# Patient Record
Sex: Female | Born: 1940
Health system: Southern US, Community
[De-identification: ages and names within clinical notes are randomized; demographics above are authoritative.]

## PROBLEM LIST (undated history)

## (undated) DIAGNOSIS — I10 Essential (primary) hypertension: Secondary | ICD-10-CM

## (undated) DIAGNOSIS — N183 Chronic kidney disease, stage 3 unspecified: Secondary | ICD-10-CM

## (undated) DIAGNOSIS — E785 Hyperlipidemia, unspecified: Secondary | ICD-10-CM

## (undated) DIAGNOSIS — E119 Type 2 diabetes mellitus without complications: Secondary | ICD-10-CM

## (undated) DIAGNOSIS — B019 Varicella without complication: Secondary | ICD-10-CM

## (undated) DIAGNOSIS — R55 Syncope and collapse: Secondary | ICD-10-CM

## (undated) DIAGNOSIS — I779 Disorder of arteries and arterioles, unspecified: Secondary | ICD-10-CM

## (undated) DIAGNOSIS — I619 Nontraumatic intracerebral hemorrhage, unspecified: Secondary | ICD-10-CM

## (undated) DIAGNOSIS — I739 Peripheral vascular disease, unspecified: Secondary | ICD-10-CM

## (undated) HISTORY — DX: Chronic kidney disease, stage 3 unspecified: N18.30

## (undated) HISTORY — DX: Peripheral vascular disease, unspecified: I73.9

## (undated) HISTORY — DX: Nontraumatic intracerebral hemorrhage, unspecified: I61.9

## (undated) HISTORY — DX: Disorder of arteries and arterioles, unspecified: I77.9

## (undated) HISTORY — DX: Essential (primary) hypertension: I10

## (undated) HISTORY — PX: COLONOSCOPY: SHX174

## (undated) HISTORY — DX: Type 2 diabetes mellitus without complications: E11.9

## (undated) HISTORY — DX: Varicella without complication: B01.9

## (undated) HISTORY — DX: Hyperlipidemia, unspecified: E78.5

## (undated) HISTORY — DX: Chronic kidney disease, stage 3 (moderate): N18.3

## (undated) HISTORY — DX: Syncope and collapse: R55

## (undated) HISTORY — PX: NECK SURGERY: SHX720

---

## 2005-08-29 ENCOUNTER — Ambulatory Visit: Payer: Self-pay | Admitting: Internal Medicine

## 2005-09-03 ENCOUNTER — Ambulatory Visit: Payer: Self-pay | Admitting: Internal Medicine

## 2005-11-07 ENCOUNTER — Ambulatory Visit: Payer: Self-pay | Admitting: Internal Medicine

## 2005-12-02 ENCOUNTER — Ambulatory Visit: Payer: Self-pay | Admitting: Internal Medicine

## 2006-01-02 ENCOUNTER — Ambulatory Visit: Payer: Self-pay | Admitting: Internal Medicine

## 2006-02-01 ENCOUNTER — Ambulatory Visit: Payer: Self-pay | Admitting: Internal Medicine

## 2006-03-21 ENCOUNTER — Ambulatory Visit: Payer: Self-pay | Admitting: Unknown Physician Specialty

## 2006-04-24 ENCOUNTER — Ambulatory Visit: Payer: Self-pay | Admitting: Internal Medicine

## 2006-05-04 ENCOUNTER — Ambulatory Visit: Payer: Self-pay | Admitting: Internal Medicine

## 2006-08-14 ENCOUNTER — Ambulatory Visit: Payer: Self-pay | Admitting: Internal Medicine

## 2006-09-03 ENCOUNTER — Ambulatory Visit: Payer: Self-pay | Admitting: Internal Medicine

## 2006-10-09 ENCOUNTER — Ambulatory Visit: Payer: Self-pay | Admitting: Internal Medicine

## 2006-11-02 ENCOUNTER — Ambulatory Visit: Payer: Self-pay | Admitting: Internal Medicine

## 2006-12-03 ENCOUNTER — Ambulatory Visit: Payer: Self-pay | Admitting: Internal Medicine

## 2006-12-16 ENCOUNTER — Ambulatory Visit: Payer: Self-pay | Admitting: Internal Medicine

## 2006-12-24 ENCOUNTER — Ambulatory Visit: Payer: Self-pay | Admitting: Internal Medicine

## 2007-01-02 ENCOUNTER — Ambulatory Visit: Payer: Self-pay | Admitting: Internal Medicine

## 2007-04-15 ENCOUNTER — Ambulatory Visit: Payer: Self-pay | Admitting: Internal Medicine

## 2007-05-05 ENCOUNTER — Ambulatory Visit: Payer: Self-pay | Admitting: Internal Medicine

## 2007-08-04 ENCOUNTER — Ambulatory Visit: Payer: Self-pay | Admitting: Internal Medicine

## 2007-08-13 ENCOUNTER — Ambulatory Visit: Payer: Self-pay | Admitting: Internal Medicine

## 2007-09-04 ENCOUNTER — Ambulatory Visit: Payer: Self-pay | Admitting: Internal Medicine

## 2007-10-14 ENCOUNTER — Ambulatory Visit: Payer: Self-pay | Admitting: Internal Medicine

## 2008-02-05 ENCOUNTER — Ambulatory Visit: Payer: Self-pay | Admitting: Internal Medicine

## 2008-03-03 ENCOUNTER — Ambulatory Visit: Payer: Self-pay | Admitting: Internal Medicine

## 2008-08-03 ENCOUNTER — Ambulatory Visit: Payer: Self-pay | Admitting: Internal Medicine

## 2008-08-09 ENCOUNTER — Ambulatory Visit: Payer: Self-pay | Admitting: Internal Medicine

## 2008-09-03 ENCOUNTER — Ambulatory Visit: Payer: Self-pay | Admitting: Internal Medicine

## 2008-10-28 ENCOUNTER — Ambulatory Visit: Payer: Self-pay | Admitting: Internal Medicine

## 2009-06-02 ENCOUNTER — Ambulatory Visit: Payer: Self-pay | Admitting: Unknown Physician Specialty

## 2009-07-04 ENCOUNTER — Ambulatory Visit: Payer: Self-pay | Admitting: Internal Medicine

## 2009-08-02 ENCOUNTER — Ambulatory Visit: Payer: Self-pay | Admitting: Internal Medicine

## 2009-08-03 ENCOUNTER — Ambulatory Visit: Payer: Self-pay | Admitting: Internal Medicine

## 2009-10-31 ENCOUNTER — Ambulatory Visit: Payer: Self-pay | Admitting: Internal Medicine

## 2009-11-02 ENCOUNTER — Ambulatory Visit: Payer: Self-pay | Admitting: Internal Medicine

## 2010-05-09 ENCOUNTER — Ambulatory Visit: Payer: Self-pay | Admitting: Internal Medicine

## 2010-11-29 ENCOUNTER — Ambulatory Visit: Payer: Self-pay | Admitting: Internal Medicine

## 2011-12-03 ENCOUNTER — Ambulatory Visit: Payer: Self-pay | Admitting: Internal Medicine

## 2011-12-20 ENCOUNTER — Observation Stay: Payer: Self-pay | Admitting: Otolaryngology

## 2011-12-24 LAB — PATHOLOGY REPORT

## 2013-01-21 ENCOUNTER — Ambulatory Visit: Payer: Self-pay | Admitting: Internal Medicine

## 2013-07-07 ENCOUNTER — Other Ambulatory Visit: Payer: Self-pay | Admitting: *Deleted

## 2013-07-08 ENCOUNTER — Encounter: Payer: Self-pay | Admitting: Vascular Surgery

## 2013-08-17 ENCOUNTER — Encounter: Payer: Self-pay | Admitting: Vascular Surgery

## 2013-08-18 ENCOUNTER — Ambulatory Visit (INDEPENDENT_AMBULATORY_CARE_PROVIDER_SITE_OTHER): Payer: Medicare Other | Admitting: Vascular Surgery

## 2013-08-18 ENCOUNTER — Encounter: Payer: Self-pay | Admitting: Vascular Surgery

## 2013-08-18 ENCOUNTER — Ambulatory Visit (HOSPITAL_COMMUNITY)
Admission: RE | Admit: 2013-08-18 | Discharge: 2013-08-18 | Disposition: A | Discharge status: Home / Self Care | Payer: Medicare Other | Source: Ambulatory Visit | Attending: Vascular Surgery | Admitting: Vascular Surgery

## 2013-08-18 DIAGNOSIS — I6529 Occlusion and stenosis of unspecified carotid artery: Secondary | ICD-10-CM

## 2013-08-18 DIAGNOSIS — I658 Occlusion and stenosis of other precerebral arteries: Secondary | ICD-10-CM | POA: Insufficient documentation

## 2013-08-18 NOTE — Progress Notes (Signed)
Subjective:     Patient ID: Laurie Harris, female   DOB: [birth year], 72 y.o.   MRN: [id number]  HPI this 72 year old female was referred for carotid occlusive disease by Dr.Shaukat Welton Flakes of [facility]. Patient has no history of stroke, lateralizing weakness, aphasia, amaurosis fugax, diplopia, blurred vision, or syncope. She was found to have a carotid bruit and has had carotid ultrasound studies for the past few years revealing some mild to moderate disease. She was referred for further evaluation.  Past Medical History  Diagnosis Date  . Carotid artery occlusion   . Dyspnea   . Diabetes mellitus without complication   . Hypertension   . Hyperlipidemia     History  Substance Use Topics  . Smoking status: Never Smoker   . Smokeless tobacco: Never Used  . Alcohol Use: No    Family History  Problem Relation Age of Onset  . Diabetes Mother   . Cancer Father     colon    No Known Allergies  Current outpatient prescriptions:alendronate (FOSAMAX) 70 MG tablet, Take 70 mg by mouth once a week. Take with a full glass of water on an empty stomach., Disp: , Rfl: ;  amLODipine (NORVASC) 5 MG tablet, Take 5 mg by mouth daily., Disp: , Rfl: ;  aspirin 81 MG tablet, Take 81 mg by mouth daily., Disp: , Rfl: ;  atorvastatin (LIPITOR) 80 MG tablet, Take 80 mg by mouth daily., Disp: , Rfl:  benazepril (LOTENSIN) 20 MG tablet, Take 20 mg by mouth daily., Disp: , Rfl: ;  Calcium Carbonate-Vitamin D (CALTRATE 600+D) 600-400 MG-UNIT per tablet, Take 1 tablet by mouth daily., Disp: , Rfl: ;  Ergocalciferol (VITAMIN D2) 2000 UNITS TABS, Take by mouth., Disp: , Rfl: ;  fenofibrate 160 MG tablet, Take 160 mg by mouth daily., Disp: , Rfl: ;  glimepiride (AMARYL) 2 MG tablet, Take 2 mg by mouth 2 (two) times daily., Disp: , Rfl:  hydrALAZINE (APRESOLINE) 50 MG tablet, Take 50 mg by mouth 2 (two) times daily., Disp: , Rfl: ;  metFORMIN (GLUCOPHAGE) 500 MG tablet, Take by mouth daily., Disp: , Rfl: ;   metoprolol (LOPRESSOR) 50 MG tablet, Take 50 mg by mouth 2 (two) times daily., Disp: , Rfl: ;  Multiple Vitamins-Minerals (SENIOR MULTIVITAMIN PLUS PO), Take by mouth., Disp: , Rfl: ;  Omega-3 Fatty Acids (FISH OIL) 1200 MG CAPS, Take by mouth., Disp: , Rfl:  pantoprazole (PROTONIX) 40 MG tablet, Take 40 mg by mouth daily., Disp: , Rfl: ;  warfarin (COUMADIN) 4 MG tablet, Take 4 mg by mouth at bedtime., Disp: , Rfl: ;  sitaGLIPtin (JANUVIA) 100 MG tablet, Take 100 mg by mouth daily., Disp: , Rfl:   BP 128/72  Pulse 60  Resp 16  Ht 5\' 6"  (1.676 m)  Wt 133 lb (60.328 kg)  BMI 21.48 kg/m2  Body mass index is 21.48 kg/(m^2).          Review of Systems denies chest pain, dyspnea on exertion, PND, orthopnea, hemoptysis, lateralizing weakness, all systems negative complete review of systems    Objective:   Physical Exam BP 128/72  Pulse 60  Resp 16  Ht 5\' 6"  (1.676 m)  Wt 133 lb (60.328 kg)  BMI 21.48 kg/m2  Gen.-alert and oriented x3 in no apparent distress HEENT normal for age Lungs no rhonchi or wheezing Cardiovascular regular rhythm no murmurs carotid pulses 3+ palpable no bruits audible Abdomen soft nontender no palpable masses Musculoskeletal free of  major deformities  Skin clear -no rashes Neurologic normal Lower extremities 3+ femoral and dorsalis pedis pulses palpable bilaterally with no edema  Today I ordered a carotid duplex exam which I reviewed and interpreted. It appears that she has a mild ICA stenosis bilaterally in the 40% range.       Assessment:     Mild bilateral ICA stenosis approximately 40% with no symptoms    Plan:     Would recommend repeating this ultrasound study in 2 years to see if there has been significant progression of disease The patient develops symptoms then repeat study should be done at that time [facility] send that that will be performed in Dr. Santo Held office as per patient request

## 2014-02-05 LAB — HM COLONOSCOPY: HM COLON: NORMAL

## 2014-03-03 ENCOUNTER — Ambulatory Visit: Payer: Self-pay | Admitting: Internal Medicine

## 2014-03-08 LAB — HM DIABETES EYE EXAM

## 2014-06-04 DIAGNOSIS — D126 Benign neoplasm of colon, unspecified: Secondary | ICD-10-CM | POA: Insufficient documentation

## 2014-07-19 ENCOUNTER — Ambulatory Visit: Payer: Self-pay | Admitting: Unknown Physician Specialty

## 2014-09-03 DIAGNOSIS — I619 Nontraumatic intracerebral hemorrhage, unspecified: Secondary | ICD-10-CM

## 2014-09-03 HISTORY — DX: Nontraumatic intracerebral hemorrhage, unspecified: I61.9

## 2014-09-06 DIAGNOSIS — I739 Peripheral vascular disease, unspecified: Secondary | ICD-10-CM | POA: Diagnosis not present

## 2014-09-06 DIAGNOSIS — E119 Type 2 diabetes mellitus without complications: Secondary | ICD-10-CM | POA: Diagnosis not present

## 2014-09-10 DIAGNOSIS — E780 Pure hypercholesterolemia: Secondary | ICD-10-CM | POA: Diagnosis not present

## 2014-09-10 DIAGNOSIS — I739 Peripheral vascular disease, unspecified: Secondary | ICD-10-CM | POA: Diagnosis not present

## 2014-09-10 DIAGNOSIS — E119 Type 2 diabetes mellitus without complications: Secondary | ICD-10-CM | POA: Diagnosis not present

## 2014-09-10 DIAGNOSIS — I340 Nonrheumatic mitral (valve) insufficiency: Secondary | ICD-10-CM | POA: Diagnosis not present

## 2014-09-10 DIAGNOSIS — I10 Essential (primary) hypertension: Secondary | ICD-10-CM | POA: Diagnosis not present

## 2014-09-17 DIAGNOSIS — E875 Hyperkalemia: Secondary | ICD-10-CM | POA: Diagnosis not present

## 2014-09-17 DIAGNOSIS — E119 Type 2 diabetes mellitus without complications: Secondary | ICD-10-CM | POA: Diagnosis not present

## 2014-09-17 DIAGNOSIS — E780 Pure hypercholesterolemia: Secondary | ICD-10-CM | POA: Diagnosis not present

## 2014-09-17 DIAGNOSIS — I10 Essential (primary) hypertension: Secondary | ICD-10-CM | POA: Diagnosis not present

## 2014-09-21 ENCOUNTER — Other Ambulatory Visit: Payer: Self-pay | Admitting: *Deleted

## 2014-09-21 ENCOUNTER — Encounter: Payer: Self-pay | Admitting: Vascular Surgery

## 2014-09-21 DIAGNOSIS — I6523 Occlusion and stenosis of bilateral carotid arteries: Secondary | ICD-10-CM

## 2014-09-27 ENCOUNTER — Encounter: Payer: Self-pay | Admitting: Vascular Surgery

## 2014-09-28 ENCOUNTER — Ambulatory Visit (HOSPITAL_COMMUNITY)
Admission: RE | Admit: 2014-09-28 | Discharge: 2014-09-28 | Disposition: A | Discharge status: Home / Self Care | Payer: Medicare Other | Source: Ambulatory Visit | Attending: Vascular Surgery | Admitting: Vascular Surgery

## 2014-09-28 ENCOUNTER — Ambulatory Visit (INDEPENDENT_AMBULATORY_CARE_PROVIDER_SITE_OTHER): Payer: Medicare Other | Admitting: Vascular Surgery

## 2014-09-28 ENCOUNTER — Encounter: Payer: Self-pay | Admitting: Vascular Surgery

## 2014-09-28 VITALS — BP 121/62 | HR 56 | Resp 16 | Ht 66.0 in | Wt 130.0 lb

## 2014-09-28 DIAGNOSIS — I6523 Occlusion and stenosis of bilateral carotid arteries: Secondary | ICD-10-CM

## 2014-09-28 NOTE — Progress Notes (Signed)
Subjective:     Patient ID: Laurie Harris, female   DOB: [birth year], 74 y.o.   MRN: [id number]  HPI this 74 year old female was seen by me in December 2014 for possible carotid occlusive disease. It was felt that she had a severe right ICA stenosis which was asymptomatic. A repeat carotid duplex exam in our office 08/18/2013 revealed a mild to moderate right ICA stenosis in the 40-50% range with a more severe right external carotid stenosis. She was referred by Dr.Shaukat Chancy Milroy. Patient has remained asymptomatic. She had a CT angiogram of the neck performed earlier this month which suggested a severe carotid bifurcation stenosis. I have seen the disc but have not seen the actual interpretation by the radiologist. She was referred back for reevaluation. She denies any lateralizing weakness, aphasia, amaurosis fugax, diplopia, blurred vision, or syncope. She is on Coumadin for suspected thrombus in her carotid artery a few years ago which did not appear on the recent CT angiogram.  Past Medical History  Diagnosis Date  . Carotid artery occlusion   . Dyspnea   . Diabetes mellitus without complication   . Hypertension   . Hyperlipidemia     History  Substance Use Topics  . Smoking status: Never Smoker   . Smokeless tobacco: Never Used  . Alcohol Use: No    Family History  Problem Relation Age of Onset  . Diabetes Mother   . Cancer Father     colon    No Known Allergies   Current outpatient prescriptions:  .  alendronate (FOSAMAX) 70 MG tablet, Take 70 mg by mouth once a week. Take with a full glass of water on an empty stomach., Disp: , Rfl:  .  amLODipine (NORVASC) 5 MG tablet, Take 5 mg by mouth daily., Disp: , Rfl:  .  aspirin 81 MG tablet, Take 81 mg by mouth daily., Disp: , Rfl:  .  atorvastatin (LIPITOR) 80 MG tablet, Take 80 mg by mouth daily., Disp: , Rfl:  .  benazepril (LOTENSIN) 20 MG tablet, Take 20 mg by mouth daily., Disp: , Rfl:  .  Calcium Carbonate-Vitamin D  (CALTRATE 600+D) 600-400 MG-UNIT per tablet, Take 1 tablet by mouth daily., Disp: , Rfl:  .  Ergocalciferol (VITAMIN D2) 2000 UNITS TABS, Take by mouth., Disp: , Rfl:  .  fenofibrate 160 MG tablet, Take 160 mg by mouth daily., Disp: , Rfl:  .  glimepiride (AMARYL) 2 MG tablet, Take 2 mg by mouth 2 (two) times daily., Disp: , Rfl:  .  hydrALAZINE (APRESOLINE) 50 MG tablet, Take 50 mg by mouth 2 (two) times daily., Disp: , Rfl:  .  metFORMIN (GLUCOPHAGE) 500 MG tablet, Take by mouth daily., Disp: , Rfl:  .  metoprolol (LOPRESSOR) 50 MG tablet, Take 50 mg by mouth 2 (two) times daily., Disp: , Rfl:  .  Multiple Vitamins-Minerals (SENIOR MULTIVITAMIN PLUS PO), Take by mouth., Disp: , Rfl:  .  Omega-3 Fatty Acids (FISH OIL) 1200 MG CAPS, Take by mouth., Disp: , Rfl:  .  pantoprazole (PROTONIX) 40 MG tablet, Take 40 mg by mouth daily., Disp: , Rfl:  .  sitaGLIPtin (JANUVIA) 100 MG tablet, Take 100 mg by mouth daily., Disp: , Rfl:  .  warfarin (COUMADIN) 4 MG tablet, Take 4 mg by mouth at bedtime., Disp: , Rfl:   BP 121/62 mmHg  Pulse 56  Resp 16  Ht 5\' 6"  (1.676 m)  Wt 130 lb (58.968 kg)  BMI 20.99 kg/m2  Body  mass index is 20.99 kg/(m^2).           Review of Systems denies chest pain, dyspnea on exertion, PND, orthopnea, hemoptysis, claudication. Patient does have diabetes mellitus and hypertension well controlled. Other systems negative and complete review of systems     Objective:   Physical Exam BP 121/62 mmHg  Pulse 56  Resp 16  Ht 5\' 6"  (1.676 m)  Wt 130 lb (58.968 kg)  BMI 20.99 kg/m2  Gen.-alert and oriented x3 in no apparent distress HEENT normal for age Lungs no rhonchi or wheezing Cardiovascular regular rhythm no murmurs carotid pulses 3+ palpable no bruits audible Abdomen soft nontender no palpable masses Musculoskeletal free of  major deformities Skin clear -no rashes Neurologic normal Lower extremities 3+ femoral and dorsalis pedis pulses palpable bilaterally  with no edema  Today I ordered a carotid duplex exam which I reviewed and interpreted. It appears that the severe stenosis is in the right external carotid artery with a velocity of 359 cm/s and that the right internal carotid artery has only an approximate 50% to maximum 60% stenosis. I also reviewed the CT angiogram which was recently performed at [facility]. This also reveals heavy calcification of the carotid bifurcation with a severe right external carotid stenosis. It appears that the right internal carotid has a moderate stenosis which is less well visualized.     Assessment:     Asymptomatic 50% right ICA stenosis with 80+ percent right ECA stenosis Diabetes mellitus type 2 well controlled #3 hypertension    Plan:     We'll continue to follow this carotid bifurcation plaque closely with repeat study in 6 months. I do not think she has a severe right internal carotid stenosis based on our 2 carotid duplex exams and the CT angiogram. I think the severe stenosis is in the right external carotid stenosis. Patient remains asymptomatic. I discussed this with her at length and we will follow this with another carotid ultrasound in 6 months. If she develops any neurologic symptoms she will be in touch with me.

## 2014-09-28 NOTE — Patient Instructions (Signed)
°Carotid Artery Disease °The carotid arteries are the two main arteries on either side of the neck that supply blood to the brain. Carotid artery disease, also called carotid artery stenosis, is the narrowing or blockage of one or both carotid arteries. Carotid artery disease increases your risk for a stroke or a transient ischemic attack (TIA). A TIA is an episode in which a waxy, fatty substance that accumulates within the artery (plaque) blocks blood flow to the brain. A TIA is considered a "warning stroke."  °CAUSES  °· Buildup of plaque inside the carotid arteries (atherosclerosis) (common). °· A weakened outpouching in an artery (aneurysm). °· Inflammation of the carotid artery (arteritis). °· A fibrous growth within the carotid artery (fibromuscular dysplasia). °· Tissue death within the carotid artery due to radiation treatment (post-radiation necrosis). °· Decreased blood flow due to spasms of the carotid artery (vasospasm). °· Separation of the walls of the carotid artery (carotid dissection). °RISK FACTORS °· High cholesterol (dyslipidemia).   °· High blood pressure (hypertension).   °· Smoking.   °· Obesity.   °· Diabetes.   °· Family history of cardiovascular disease.   °· Inactivity or lack of regular exercise.   °· Being female. Men have an increased risk of developing atherosclerosis earlier in life than women.   °SYMPTOMS  °Carotid artery disease does not cause symptoms. °DIAGNOSIS °Diagnosis of carotid artery disease may include:  °· A physical exam. Your health care provider may hear an abnormal sound (bruit) when listening to the carotid arteries.   °· Specific tests that look at the blood flow in the carotid arteries. These tests include:   °¨ Carotid artery ultrasonography.   °¨ Carotid or cerebral angiography.   °¨ Computerized tomographic angiography (CTA).   °¨ Magnetic resonance angiography (MRA).   °TREATMENT  °Treatment of carotid artery disease can include a combination of treatments.  Treatment options include: °· Surgery. You may have:   °¨ A carotid endarterectomy. This is a surgery to remove the blockages in the carotid arteries.   °¨ A carotid angioplasty with stenting. This is a nonsurgical interventional procedure. A wire mesh (stent) is used to widen the blocked carotid arteries.   °· Medicines to control blood pressure, cholesterol, and reduce blood clotting (antiplatelet therapy).   °· Adjusting your diet.   °· Lifestyle changes such as:   °¨ Quitting smoking.   °¨ Exercising as tolerated or as directed by your health care provider.   °¨ Controlling and maintaining a good blood pressure.   °¨ Keeping cholesterol levels under control.   °HOME CARE INSTRUCTIONS  °· Take medicines only as directed by your health care provider. Make sure you understand all your medicine instructions. Do not stop your medicines without talking to your health care provider.   °· Follow your health care provider's diet instructions. It is important to eat a healthy diet that is low in saturated fats and includes plenty of fresh fruits, vegetables, and lean meats. High-fat, high-sodium foods as well as foods that are fried, overly processed, or have poor nutritional value should be avoided. °· Maintain a healthy weight.   °· Stay physically active. It is recommended that you get at least 30 minutes of activity every day.   °· Do not use any tobacco products including cigarettes, chewing tobacco, or electronic cigarettes. If you need help quitting, ask your health care provider. °· Limit alcohol use to:   °¨ No more than 2 drinks per day for men.   °¨ No more than 1 drink per day for nonpregnant women.   °· Do not use illegal drugs.   °· Keep all follow-up visits as directed by your health   care provider.  SEEK IMMEDIATE MEDICAL CARE IF:  You develop TIA or stroke symptoms. These include:   Sudden weakness or numbness on one side of the body, such as in the face, arm, or leg.   Sudden confusion.    Trouble speaking (aphasia) or understanding.   Sudden trouble seeing out of one or both eyes.   Sudden trouble walking.   Dizziness or feeling like you might faint.   Loss of balance or coordination.   Sudden severe headache with no known cause.   Sudden trouble swallowing (dysphagia).  If you have any of these symptoms, call your local emergency services (911 in [place] Do not drive yourself to the clinic or hospital. This is a medical emergency.  Document Released: 11/12/2011 Document Revised: 01/04/2014 Document Reviewed: 02/18/2013 [facility] Patient Information 2015 [place], [place]. This information is not intended to replace advice given to you by your health care provider. Make sure you discuss any questions you have with your health care provider.

## 2014-10-11 DIAGNOSIS — I340 Nonrheumatic mitral (valve) insufficiency: Secondary | ICD-10-CM | POA: Diagnosis not present

## 2014-10-11 DIAGNOSIS — E780 Pure hypercholesterolemia: Secondary | ICD-10-CM | POA: Diagnosis not present

## 2014-10-11 DIAGNOSIS — Z7901 Long term (current) use of anticoagulants: Secondary | ICD-10-CM | POA: Diagnosis not present

## 2014-10-11 DIAGNOSIS — I10 Essential (primary) hypertension: Secondary | ICD-10-CM | POA: Diagnosis not present

## 2014-10-11 DIAGNOSIS — I739 Peripheral vascular disease, unspecified: Secondary | ICD-10-CM | POA: Diagnosis not present

## 2014-10-12 DIAGNOSIS — N183 Chronic kidney disease, stage 3 (moderate): Secondary | ICD-10-CM | POA: Diagnosis not present

## 2014-10-12 DIAGNOSIS — I10 Essential (primary) hypertension: Secondary | ICD-10-CM | POA: Diagnosis not present

## 2014-10-12 DIAGNOSIS — E559 Vitamin D deficiency, unspecified: Secondary | ICD-10-CM | POA: Diagnosis not present

## 2014-10-20 DIAGNOSIS — Z7901 Long term (current) use of anticoagulants: Secondary | ICD-10-CM | POA: Diagnosis not present

## 2014-10-27 DIAGNOSIS — Z7901 Long term (current) use of anticoagulants: Secondary | ICD-10-CM | POA: Diagnosis not present

## 2014-10-29 DIAGNOSIS — I10 Essential (primary) hypertension: Secondary | ICD-10-CM | POA: Diagnosis not present

## 2014-10-29 DIAGNOSIS — E780 Pure hypercholesterolemia: Secondary | ICD-10-CM | POA: Diagnosis not present

## 2014-10-29 DIAGNOSIS — E119 Type 2 diabetes mellitus without complications: Secondary | ICD-10-CM | POA: Diagnosis not present

## 2014-11-12 DIAGNOSIS — Z7901 Long term (current) use of anticoagulants: Secondary | ICD-10-CM | POA: Diagnosis not present

## 2014-11-26 ENCOUNTER — Emergency Department: Payer: Self-pay | Admitting: Emergency Medicine

## 2014-11-26 DIAGNOSIS — R1110 Vomiting, unspecified: Secondary | ICD-10-CM | POA: Diagnosis not present

## 2014-11-26 DIAGNOSIS — E11649 Type 2 diabetes mellitus with hypoglycemia without coma: Secondary | ICD-10-CM | POA: Diagnosis not present

## 2014-11-26 DIAGNOSIS — Z79899 Other long term (current) drug therapy: Secondary | ICD-10-CM | POA: Diagnosis not present

## 2014-11-26 DIAGNOSIS — Z7901 Long term (current) use of anticoagulants: Secondary | ICD-10-CM | POA: Diagnosis not present

## 2014-11-26 DIAGNOSIS — R61 Generalized hyperhidrosis: Secondary | ICD-10-CM | POA: Diagnosis not present

## 2014-11-26 DIAGNOSIS — I10 Essential (primary) hypertension: Secondary | ICD-10-CM | POA: Diagnosis not present

## 2014-11-26 DIAGNOSIS — Z7982 Long term (current) use of aspirin: Secondary | ICD-10-CM | POA: Diagnosis not present

## 2014-11-26 DIAGNOSIS — R4182 Altered mental status, unspecified: Secondary | ICD-10-CM | POA: Diagnosis not present

## 2014-11-26 DIAGNOSIS — N390 Urinary tract infection, site not specified: Secondary | ICD-10-CM | POA: Diagnosis not present

## 2014-11-26 LAB — CBC
HCT: 39.4 % (ref 35.0–47.0)
HGB: 12.7 g/dL (ref 12.0–16.0)
MCH: 32.6 pg (ref 26.0–34.0)
MCHC: 32.3 g/dL (ref 32.0–36.0)
MCV: 101 fL — ABNORMAL HIGH (ref 80–100)
Platelet: 237 10*3/uL (ref 150–440)
RBC: 3.9 10*6/uL (ref 3.80–5.20)
RDW: 14.8 % — ABNORMAL HIGH (ref 11.5–14.5)
WBC: 8.4 10*3/uL (ref 3.6–11.0)

## 2014-11-26 LAB — URINALYSIS, COMPLETE
Bacteria: NONE SEEN
Bilirubin,UR: NEGATIVE
Blood: NEGATIVE
Ketone: NEGATIVE
NITRITE: NEGATIVE
PROTEIN: NEGATIVE
Ph: 5 (ref 4.5–8.0)
RBC,UR: 1 /HPF (ref 0–5)
Specific Gravity: 1.02 (ref 1.003–1.030)
Squamous Epithelial: 1
WBC UR: 80 /HPF (ref 0–5)

## 2014-11-26 LAB — COMPREHENSIVE METABOLIC PANEL
ANION GAP: 5 — AB (ref 7–16)
Albumin: 3.9 g/dL
Alkaline Phosphatase: 61 U/L
BILIRUBIN TOTAL: 0.5 mg/dL
BUN: 33 mg/dL — AB
CHLORIDE: 113 mmol/L — AB
Calcium, Total: 8.9 mg/dL
Co2: 22 mmol/L
Creatinine: 1.19 mg/dL — ABNORMAL HIGH
GFR CALC AF AMER: 51 — AB
GFR CALC NON AF AMER: 44 — AB
GLUCOSE: 157 mg/dL — AB
POTASSIUM: 3.9 mmol/L
SGOT(AST): 40 U/L
SGPT (ALT): 39 U/L
SODIUM: 140 mmol/L
Total Protein: 6.8 g/dL

## 2014-11-28 LAB — URINE CULTURE

## 2014-11-30 DIAGNOSIS — I10 Essential (primary) hypertension: Secondary | ICD-10-CM | POA: Diagnosis not present

## 2014-11-30 DIAGNOSIS — E780 Pure hypercholesterolemia: Secondary | ICD-10-CM | POA: Diagnosis not present

## 2014-11-30 DIAGNOSIS — E119 Type 2 diabetes mellitus without complications: Secondary | ICD-10-CM | POA: Diagnosis not present

## 2014-11-30 DIAGNOSIS — N390 Urinary tract infection, site not specified: Secondary | ICD-10-CM | POA: Diagnosis not present

## 2014-12-15 DIAGNOSIS — E780 Pure hypercholesterolemia: Secondary | ICD-10-CM | POA: Diagnosis not present

## 2014-12-15 DIAGNOSIS — Z7901 Long term (current) use of anticoagulants: Secondary | ICD-10-CM | POA: Diagnosis not present

## 2014-12-15 DIAGNOSIS — E119 Type 2 diabetes mellitus without complications: Secondary | ICD-10-CM | POA: Diagnosis not present

## 2014-12-15 DIAGNOSIS — I10 Essential (primary) hypertension: Secondary | ICD-10-CM | POA: Diagnosis not present

## 2014-12-16 DIAGNOSIS — E780 Pure hypercholesterolemia: Secondary | ICD-10-CM | POA: Diagnosis not present

## 2014-12-16 DIAGNOSIS — I10 Essential (primary) hypertension: Secondary | ICD-10-CM | POA: Diagnosis not present

## 2014-12-16 DIAGNOSIS — E1165 Type 2 diabetes mellitus with hyperglycemia: Secondary | ICD-10-CM | POA: Diagnosis not present

## 2014-12-20 DIAGNOSIS — E780 Pure hypercholesterolemia: Secondary | ICD-10-CM | POA: Diagnosis not present

## 2014-12-20 DIAGNOSIS — I10 Essential (primary) hypertension: Secondary | ICD-10-CM | POA: Diagnosis not present

## 2014-12-20 DIAGNOSIS — E1021 Type 1 diabetes mellitus with diabetic nephropathy: Secondary | ICD-10-CM | POA: Diagnosis not present

## 2014-12-20 DIAGNOSIS — R5383 Other fatigue: Secondary | ICD-10-CM | POA: Diagnosis not present

## 2014-12-26 NOTE — Op Note (Signed)
PATIENT NAME:  Laurie Harris, Laurie Harris MR#:  [id number] DATE OF BIRTH:  Jun 09, 1941  DATE OF PROCEDURE:  12/20/2011  PREOPERATIVE DIAGNOSIS:  Right submandibular gland staghorn calculus versus mass.   POSTOPERATIVE DIAGNOSIS: Right submandibular gland staghorn calculus.   PROCEDURE: Right submandibular gland resection.   SURGEON: Janalee Dane, M.D.   ASSISTANTAzucena Fallen, M.D.  [employer]: The patient was identified in the holding area and was brought back to the operating room and placed in the supine position on the operating room table. After general ventricular endotracheal anesthesia had been induced, the patient was turned 90 degrees counterclockwise from anesthesia and placed on a shoulder roll. The right neck incision was marked, locally anesthetized, prepped and draped in the usual fashion. A #15 blade was used to make an incision approximately 2.5 cm in length. This was carried down through the platysma and a subplatysmal plane was elevated. Care was taken to preserve the marginal mandibular nerve and a nerve stimulator was used to help protect the nerve. The flap was elevated superiorly and the submandibular gland was isolated, skeletonized, and the branch of the facial vein and artery were ligated in the usual fashion. The submandibular ganglion and submandibular duct were ligated in the usual fashion as well, and the large approximately 1.5 x 1.8-cm staghorn calculus was palpated in the hilum of the gland. The gland was sent for pathologic analysis and the wound was copiously irrigated. Meticulous hemostasis was achieved in the wound. The wound was closed over a 7-mm Jackson-Pratt drain. The patient was allowed to emerge from anesthesia, extubated, and taken to the recovery room in stable condition. There were no complications. Estimated blood loss was less than 10 mL.   ____________________________ J. Nadeen Landau, MD [id number] D: 12/20/2011 15:57:01 ET T: 12/20/2011  17:40:08 ET JOB#: [id number]  Nicholos Johns MD ELECTRONICALLY SIGNED 01/02/2012 18:27

## 2014-12-27 LAB — SURGICAL PATHOLOGY

## 2014-12-31 ENCOUNTER — Emergency Department
Admit: 2014-12-31 | Disposition: A | Discharge status: Other Acute Inpatient Hospital | Payer: Self-pay | Admitting: Emergency Medicine

## 2014-12-31 DIAGNOSIS — R531 Weakness: Secondary | ICD-10-CM | POA: Diagnosis not present

## 2014-12-31 DIAGNOSIS — Z7901 Long term (current) use of anticoagulants: Secondary | ICD-10-CM | POA: Diagnosis not present

## 2014-12-31 DIAGNOSIS — D68318 Other hemorrhagic disorder due to intrinsic circulating anticoagulants, antibodies, or inhibitors: Secondary | ICD-10-CM | POA: Diagnosis not present

## 2014-12-31 DIAGNOSIS — R42 Dizziness and giddiness: Secondary | ICD-10-CM | POA: Diagnosis not present

## 2014-12-31 DIAGNOSIS — R69 Illness, unspecified: Secondary | ICD-10-CM | POA: Diagnosis not present

## 2014-12-31 DIAGNOSIS — G319 Degenerative disease of nervous system, unspecified: Secondary | ICD-10-CM | POA: Diagnosis not present

## 2014-12-31 DIAGNOSIS — E162 Hypoglycemia, unspecified: Secondary | ICD-10-CM | POA: Diagnosis not present

## 2014-12-31 DIAGNOSIS — I629 Nontraumatic intracranial hemorrhage, unspecified: Secondary | ICD-10-CM | POA: Diagnosis not present

## 2014-12-31 DIAGNOSIS — E119 Type 2 diabetes mellitus without complications: Secondary | ICD-10-CM | POA: Diagnosis not present

## 2014-12-31 DIAGNOSIS — I10 Essential (primary) hypertension: Secondary | ICD-10-CM | POA: Diagnosis not present

## 2014-12-31 DIAGNOSIS — R112 Nausea with vomiting, unspecified: Secondary | ICD-10-CM | POA: Diagnosis not present

## 2014-12-31 LAB — CBC
HCT: 36.9 % (ref 35.0–47.0)
HGB: 12 g/dL (ref 12.0–16.0)
MCH: 32.5 pg (ref 26.0–34.0)
MCHC: 32.6 g/dL (ref 32.0–36.0)
MCV: 100 fL (ref 80–100)
Platelet: 246 10*3/uL (ref 150–440)
RBC: 3.7 10*6/uL — ABNORMAL LOW (ref 3.80–5.20)
RDW: 13.6 % (ref 11.5–14.5)
WBC: 10.2 10*3/uL (ref 3.6–11.0)

## 2014-12-31 LAB — URINALYSIS, COMPLETE
BACTERIA: NONE SEEN
BILIRUBIN, UR: NEGATIVE
BLOOD: NEGATIVE
Glucose,UR: >=500 mg/dL (ref 0–75)
Leukocyte Esterase: NEGATIVE
Nitrite: NEGATIVE
Ph: 5 (ref 4.5–8.0)
Protein: NEGATIVE
SQUAMOUS EPITHELIAL: NONE SEEN
Specific Gravity: 1.009 (ref 1.003–1.030)

## 2014-12-31 LAB — COMPREHENSIVE METABOLIC PANEL
ALK PHOS: 51 U/L
Albumin: 3.4 g/dL — ABNORMAL LOW
Anion Gap: 9 (ref 7–16)
BILIRUBIN TOTAL: 0.7 mg/dL
BUN: 32 mg/dL — ABNORMAL HIGH
CALCIUM: 9 mg/dL
CHLORIDE: 110 mmol/L
CO2: 22 mmol/L
Creatinine: 1.02 mg/dL — ABNORMAL HIGH
EGFR (African American): >60
GFR CALC NON AF AMER: 53 — AB
GLUCOSE: 174 mg/dL — AB
POTASSIUM: 3.4 mmol/L — AB
SGOT(AST): 50 U/L — ABNORMAL HIGH
SGPT (ALT): 61 U/L — ABNORMAL HIGH
Sodium: 141 mmol/L
Total Protein: 6.3 g/dL — ABNORMAL LOW

## 2014-12-31 LAB — PROTIME-INR
INR: 2.5
Prothrombin Time: 26.9 secs — ABNORMAL HIGH

## 2014-12-31 LAB — TROPONIN I: Troponin-I: <0.03 ng/mL

## 2015-01-01 DIAGNOSIS — I614 Nontraumatic intracerebral hemorrhage in cerebellum: Secondary | ICD-10-CM | POA: Diagnosis not present

## 2015-01-01 DIAGNOSIS — R55 Syncope and collapse: Secondary | ICD-10-CM | POA: Diagnosis not present

## 2015-01-01 DIAGNOSIS — I615 Nontraumatic intracerebral hemorrhage, intraventricular: Secondary | ICD-10-CM | POA: Diagnosis not present

## 2015-01-01 DIAGNOSIS — I10 Essential (primary) hypertension: Secondary | ICD-10-CM | POA: Diagnosis not present

## 2015-01-01 DIAGNOSIS — E785 Hyperlipidemia, unspecified: Secondary | ICD-10-CM | POA: Diagnosis not present

## 2015-01-01 DIAGNOSIS — Z7901 Long term (current) use of anticoagulants: Secondary | ICD-10-CM | POA: Diagnosis not present

## 2015-01-01 DIAGNOSIS — I959 Hypotension, unspecified: Secondary | ICD-10-CM | POA: Diagnosis not present

## 2015-01-01 DIAGNOSIS — I618 Other nontraumatic intracerebral hemorrhage: Secondary | ICD-10-CM | POA: Diagnosis not present

## 2015-01-01 DIAGNOSIS — E119 Type 2 diabetes mellitus without complications: Secondary | ICD-10-CM | POA: Diagnosis not present

## 2015-01-01 DIAGNOSIS — J8403 Idiopathic pulmonary hemosiderosis: Secondary | ICD-10-CM | POA: Diagnosis not present

## 2015-01-01 DIAGNOSIS — I639 Cerebral infarction, unspecified: Secondary | ICD-10-CM | POA: Diagnosis not present

## 2015-01-01 DIAGNOSIS — E1165 Type 2 diabetes mellitus with hyperglycemia: Secondary | ICD-10-CM | POA: Diagnosis present

## 2015-01-01 DIAGNOSIS — I6523 Occlusion and stenosis of bilateral carotid arteries: Secondary | ICD-10-CM | POA: Diagnosis not present

## 2015-01-01 DIAGNOSIS — Z79899 Other long term (current) drug therapy: Secondary | ICD-10-CM | POA: Diagnosis not present

## 2015-01-01 DIAGNOSIS — Z86718 Personal history of other venous thrombosis and embolism: Secondary | ICD-10-CM | POA: Diagnosis not present

## 2015-01-01 DIAGNOSIS — D689 Coagulation defect, unspecified: Secondary | ICD-10-CM | POA: Diagnosis not present

## 2015-01-01 DIAGNOSIS — R739 Hyperglycemia, unspecified: Secondary | ICD-10-CM | POA: Diagnosis not present

## 2015-01-01 DIAGNOSIS — I214 Non-ST elevation (NSTEMI) myocardial infarction: Secondary | ICD-10-CM | POA: Diagnosis not present

## 2015-01-01 DIAGNOSIS — I619 Nontraumatic intracerebral hemorrhage, unspecified: Secondary | ICD-10-CM | POA: Diagnosis not present

## 2015-01-01 DIAGNOSIS — M8580 Other specified disorders of bone density and structure, unspecified site: Secondary | ICD-10-CM | POA: Diagnosis present

## 2015-01-06 DIAGNOSIS — Z794 Long term (current) use of insulin: Secondary | ICD-10-CM | POA: Diagnosis not present

## 2015-01-06 DIAGNOSIS — M8580 Other specified disorders of bone density and structure, unspecified site: Secondary | ICD-10-CM | POA: Diagnosis not present

## 2015-01-06 DIAGNOSIS — Z9181 History of falling: Secondary | ICD-10-CM | POA: Diagnosis not present

## 2015-01-06 DIAGNOSIS — E785 Hyperlipidemia, unspecified: Secondary | ICD-10-CM | POA: Diagnosis not present

## 2015-01-06 DIAGNOSIS — E119 Type 2 diabetes mellitus without complications: Secondary | ICD-10-CM | POA: Diagnosis not present

## 2015-01-06 DIAGNOSIS — I10 Essential (primary) hypertension: Secondary | ICD-10-CM | POA: Diagnosis not present

## 2015-01-06 DIAGNOSIS — I69151 Hemiplegia and hemiparesis following nontraumatic intracerebral hemorrhage affecting right dominant side: Secondary | ICD-10-CM | POA: Diagnosis not present

## 2015-01-06 DIAGNOSIS — I959 Hypotension, unspecified: Secondary | ICD-10-CM | POA: Diagnosis not present

## 2015-01-06 DIAGNOSIS — Z86718 Personal history of other venous thrombosis and embolism: Secondary | ICD-10-CM | POA: Diagnosis not present

## 2015-01-06 DIAGNOSIS — J8403 Idiopathic pulmonary hemosiderosis: Secondary | ICD-10-CM | POA: Diagnosis not present

## 2015-01-07 DIAGNOSIS — I10 Essential (primary) hypertension: Secondary | ICD-10-CM | POA: Diagnosis not present

## 2015-01-07 DIAGNOSIS — E119 Type 2 diabetes mellitus without complications: Secondary | ICD-10-CM | POA: Diagnosis not present

## 2015-01-07 DIAGNOSIS — I739 Peripheral vascular disease, unspecified: Secondary | ICD-10-CM | POA: Diagnosis not present

## 2015-01-07 DIAGNOSIS — E780 Pure hypercholesterolemia: Secondary | ICD-10-CM | POA: Diagnosis not present

## 2015-01-11 DIAGNOSIS — I69151 Hemiplegia and hemiparesis following nontraumatic intracerebral hemorrhage affecting right dominant side: Secondary | ICD-10-CM | POA: Diagnosis not present

## 2015-01-11 DIAGNOSIS — J8403 Idiopathic pulmonary hemosiderosis: Secondary | ICD-10-CM | POA: Diagnosis not present

## 2015-01-11 DIAGNOSIS — I959 Hypotension, unspecified: Secondary | ICD-10-CM | POA: Diagnosis not present

## 2015-01-11 DIAGNOSIS — I10 Essential (primary) hypertension: Secondary | ICD-10-CM | POA: Diagnosis not present

## 2015-01-11 DIAGNOSIS — E119 Type 2 diabetes mellitus without complications: Secondary | ICD-10-CM | POA: Diagnosis not present

## 2015-01-12 DIAGNOSIS — I10 Essential (primary) hypertension: Secondary | ICD-10-CM | POA: Diagnosis not present

## 2015-01-12 DIAGNOSIS — I69151 Hemiplegia and hemiparesis following nontraumatic intracerebral hemorrhage affecting right dominant side: Secondary | ICD-10-CM | POA: Diagnosis not present

## 2015-01-12 DIAGNOSIS — J8403 Idiopathic pulmonary hemosiderosis: Secondary | ICD-10-CM | POA: Diagnosis not present

## 2015-01-12 DIAGNOSIS — I959 Hypotension, unspecified: Secondary | ICD-10-CM | POA: Diagnosis not present

## 2015-01-12 DIAGNOSIS — E119 Type 2 diabetes mellitus without complications: Secondary | ICD-10-CM | POA: Diagnosis not present

## 2015-01-12 DIAGNOSIS — I614 Nontraumatic intracerebral hemorrhage in cerebellum: Secondary | ICD-10-CM | POA: Diagnosis not present

## 2015-01-14 DIAGNOSIS — I10 Essential (primary) hypertension: Secondary | ICD-10-CM | POA: Diagnosis not present

## 2015-01-14 DIAGNOSIS — J8403 Idiopathic pulmonary hemosiderosis: Secondary | ICD-10-CM | POA: Diagnosis not present

## 2015-01-14 DIAGNOSIS — I959 Hypotension, unspecified: Secondary | ICD-10-CM | POA: Diagnosis not present

## 2015-01-14 DIAGNOSIS — E119 Type 2 diabetes mellitus without complications: Secondary | ICD-10-CM | POA: Diagnosis not present

## 2015-01-14 DIAGNOSIS — I69151 Hemiplegia and hemiparesis following nontraumatic intracerebral hemorrhage affecting right dominant side: Secondary | ICD-10-CM | POA: Diagnosis not present

## 2015-01-17 DIAGNOSIS — I10 Essential (primary) hypertension: Secondary | ICD-10-CM | POA: Diagnosis not present

## 2015-01-17 DIAGNOSIS — I69151 Hemiplegia and hemiparesis following nontraumatic intracerebral hemorrhage affecting right dominant side: Secondary | ICD-10-CM | POA: Diagnosis not present

## 2015-01-17 DIAGNOSIS — I959 Hypotension, unspecified: Secondary | ICD-10-CM | POA: Diagnosis not present

## 2015-01-17 DIAGNOSIS — J8403 Idiopathic pulmonary hemosiderosis: Secondary | ICD-10-CM | POA: Diagnosis not present

## 2015-01-17 DIAGNOSIS — E119 Type 2 diabetes mellitus without complications: Secondary | ICD-10-CM | POA: Diagnosis not present

## 2015-01-19 DIAGNOSIS — I69151 Hemiplegia and hemiparesis following nontraumatic intracerebral hemorrhage affecting right dominant side: Secondary | ICD-10-CM | POA: Diagnosis not present

## 2015-01-19 DIAGNOSIS — I10 Essential (primary) hypertension: Secondary | ICD-10-CM | POA: Diagnosis not present

## 2015-01-19 DIAGNOSIS — J8403 Idiopathic pulmonary hemosiderosis: Secondary | ICD-10-CM | POA: Diagnosis not present

## 2015-01-19 DIAGNOSIS — I959 Hypotension, unspecified: Secondary | ICD-10-CM | POA: Diagnosis not present

## 2015-01-19 DIAGNOSIS — E119 Type 2 diabetes mellitus without complications: Secondary | ICD-10-CM | POA: Diagnosis not present

## 2015-01-24 DIAGNOSIS — I10 Essential (primary) hypertension: Secondary | ICD-10-CM | POA: Diagnosis not present

## 2015-01-24 DIAGNOSIS — I69151 Hemiplegia and hemiparesis following nontraumatic intracerebral hemorrhage affecting right dominant side: Secondary | ICD-10-CM | POA: Diagnosis not present

## 2015-01-24 DIAGNOSIS — I959 Hypotension, unspecified: Secondary | ICD-10-CM | POA: Diagnosis not present

## 2015-01-24 DIAGNOSIS — E119 Type 2 diabetes mellitus without complications: Secondary | ICD-10-CM | POA: Diagnosis not present

## 2015-01-24 DIAGNOSIS — J8403 Idiopathic pulmonary hemosiderosis: Secondary | ICD-10-CM | POA: Diagnosis not present

## 2015-01-25 ENCOUNTER — Encounter: Payer: Self-pay | Admitting: Cardiovascular Disease

## 2015-01-26 DIAGNOSIS — E119 Type 2 diabetes mellitus without complications: Secondary | ICD-10-CM | POA: Diagnosis not present

## 2015-01-26 DIAGNOSIS — J8403 Idiopathic pulmonary hemosiderosis: Secondary | ICD-10-CM | POA: Diagnosis not present

## 2015-01-26 DIAGNOSIS — I69151 Hemiplegia and hemiparesis following nontraumatic intracerebral hemorrhage affecting right dominant side: Secondary | ICD-10-CM | POA: Diagnosis not present

## 2015-01-26 DIAGNOSIS — I959 Hypotension, unspecified: Secondary | ICD-10-CM | POA: Diagnosis not present

## 2015-01-26 DIAGNOSIS — I10 Essential (primary) hypertension: Secondary | ICD-10-CM | POA: Diagnosis not present

## 2015-01-27 DIAGNOSIS — E119 Type 2 diabetes mellitus without complications: Secondary | ICD-10-CM | POA: Diagnosis not present

## 2015-01-27 DIAGNOSIS — I10 Essential (primary) hypertension: Secondary | ICD-10-CM | POA: Diagnosis not present

## 2015-01-27 DIAGNOSIS — I629 Nontraumatic intracranial hemorrhage, unspecified: Secondary | ICD-10-CM | POA: Diagnosis not present

## 2015-02-02 DIAGNOSIS — I69151 Hemiplegia and hemiparesis following nontraumatic intracerebral hemorrhage affecting right dominant side: Secondary | ICD-10-CM | POA: Diagnosis not present

## 2015-02-02 DIAGNOSIS — J8403 Idiopathic pulmonary hemosiderosis: Secondary | ICD-10-CM | POA: Diagnosis not present

## 2015-02-02 DIAGNOSIS — I10 Essential (primary) hypertension: Secondary | ICD-10-CM | POA: Diagnosis not present

## 2015-02-02 DIAGNOSIS — E119 Type 2 diabetes mellitus without complications: Secondary | ICD-10-CM | POA: Diagnosis not present

## 2015-02-02 DIAGNOSIS — I959 Hypotension, unspecified: Secondary | ICD-10-CM | POA: Diagnosis not present

## 2015-02-10 DIAGNOSIS — E559 Vitamin D deficiency, unspecified: Secondary | ICD-10-CM | POA: Diagnosis not present

## 2015-02-10 DIAGNOSIS — N183 Chronic kidney disease, stage 3 (moderate): Secondary | ICD-10-CM | POA: Diagnosis not present

## 2015-02-10 DIAGNOSIS — I10 Essential (primary) hypertension: Secondary | ICD-10-CM | POA: Diagnosis not present

## 2015-03-03 ENCOUNTER — Ambulatory Visit (INDEPENDENT_AMBULATORY_CARE_PROVIDER_SITE_OTHER): Payer: Medicare Other | Admitting: Nurse Practitioner

## 2015-03-03 ENCOUNTER — Encounter: Payer: Self-pay | Admitting: Nurse Practitioner

## 2015-03-03 VITALS — BP 108/68 | HR 84 | Temp 97.7°F | Resp 14 | Ht 66.0 in | Wt 126.0 lb

## 2015-03-03 DIAGNOSIS — I6523 Occlusion and stenosis of bilateral carotid arteries: Secondary | ICD-10-CM

## 2015-03-03 DIAGNOSIS — I10 Essential (primary) hypertension: Secondary | ICD-10-CM

## 2015-03-03 DIAGNOSIS — E118 Type 2 diabetes mellitus with unspecified complications: Secondary | ICD-10-CM

## 2015-03-03 DIAGNOSIS — I614 Nontraumatic intracerebral hemorrhage in cerebellum: Secondary | ICD-10-CM

## 2015-03-03 DIAGNOSIS — Z7189 Other specified counseling: Secondary | ICD-10-CM

## 2015-03-03 DIAGNOSIS — E785 Hyperlipidemia, unspecified: Secondary | ICD-10-CM

## 2015-03-03 DIAGNOSIS — Z7689 Persons encountering health services in other specified circumstances: Secondary | ICD-10-CM

## 2015-03-03 NOTE — Progress Notes (Signed)
Pre visit review using our clinic review tool, if applicable. No additional management support is needed unless otherwise documented below in the visit note. 

## 2015-03-03 NOTE — Patient Instructions (Addendum)
Follow up in 2 months for check on A1c and cholesterol (come fasting please- nothing to eat after midnight, only water or black coffee). Early appointment if possible.   Nice to meet you and welcome to [facility]!

## 2015-03-03 NOTE — Progress Notes (Signed)
Subjective:    Patient ID: Laurie Harris, female    DOB: 24-Feb-1941, 74 y.o.   MRN: [id number]  HPI  [name] is a 74 yo female establishing care today. Husband is accompanying her today.   1) New pt info:   Mammogram- 03/2014   Pap- 2014   Colonoscopy- 07/19/14, normal   Eye Exam- 03/19/14   2) Chronic Problems-  Stroke- April 29th, 2016  HTN/Hyperlipidemia- Lisinopril, benazapril, and atorvastatin  DM type II- A1c 9/1 in May, on insulin Humalog 15 units twice daily, metformin, januvia, glimepiride  Warfarin- stopped   3) Acute Problems- Coming from [facility]   Insulin 7 am and 7 pm- checks BS twice daily   BP daily in the morning  Dr. Holley Raring- Kidney Dr.  Dr. Kellie Simmering in [place] June- 126- 211 fasting AM   123-237 bedtime   Review of Systems  Constitutional: Negative for fever, chills, diaphoresis and fatigue.  Respiratory: Negative for chest tightness, shortness of breath and wheezing.   Cardiovascular: Negative for chest pain, palpitations and leg swelling.  Gastrointestinal: Negative for nausea, vomiting, diarrhea and rectal pain.  Skin: Negative for rash.  Neurological: Negative for dizziness, weakness, numbness and headaches.  Psychiatric/Behavioral: The patient is not nervous/anxious.    Past Medical History  Diagnosis Date  . Carotid artery occlusion   . Dyspnea   . Diabetes mellitus without complication   . Hypertension   . Hyperlipidemia   . Stroke 2016  . Chicken pox     History   Social History  . Marital Status: Married    Spouse Name: N/A  . Number of Children: N/A  . Years of Education: N/A   Occupational History  . Not on file.   Social History Main Topics  . Smoking status: Never Smoker   . Smokeless tobacco: Never Used  . Alcohol Use: No  . Drug Use: No  . Sexual Activity: Not on file   Other Topics Concern  . Not on file   Social History Narrative    Past Surgical History  Procedure Laterality Date  . Neck surgery       Family History  Problem Relation Age of Onset  . Diabetes Mother   . Cancer Father     colon    No Known Allergies  Current Outpatient Prescriptions on File Prior to Visit  Medication Sig Dispense Refill  . alendronate (FOSAMAX) 70 MG tablet Take 70 mg by mouth once a week. Take with a full glass of water on an empty stomach.    Marland Kitchen aspirin 81 MG tablet Take 81 mg by mouth daily.    Marland Kitchen atorvastatin (LIPITOR) 80 MG tablet Take 80 mg by mouth daily.    . fenofibrate 160 MG tablet Take 160 mg by mouth daily.     No current facility-administered medications on file prior to visit.      Objective:   Physical Exam  Constitutional: She is oriented to person, place, and time. She appears well-developed and well-nourished. No distress.  HENT:  Head: Normocephalic and atraumatic.  Right Ear: External ear normal.  Left Ear: External ear normal.  Cardiovascular: Normal rate, regular rhythm, normal heart sounds and intact distal pulses.  Exam reveals no gallop and no friction rub.   No murmur heard. Pulmonary/Chest: Effort normal and breath sounds normal. No respiratory distress. She has no wheezes. She has no rales. She exhibits no tenderness.  Neurological: She is alert and oriented to person, place, and  time. No cranial nerve deficit. She exhibits normal muscle tone. Coordination normal.  Skin: Skin is warm and dry. No rash noted. She is not diaphoretic.  Psychiatric: She has a normal mood and affect. Her behavior is normal. Judgment and thought content normal.      Assessment & Plan:

## 2015-03-16 DIAGNOSIS — Z7689 Persons encountering health services in other specified circumstances: Secondary | ICD-10-CM | POA: Insufficient documentation

## 2015-03-16 DIAGNOSIS — E785 Hyperlipidemia, unspecified: Secondary | ICD-10-CM | POA: Insufficient documentation

## 2015-03-16 DIAGNOSIS — I10 Essential (primary) hypertension: Secondary | ICD-10-CM | POA: Insufficient documentation

## 2015-03-16 DIAGNOSIS — I614 Nontraumatic intracerebral hemorrhage in cerebellum: Secondary | ICD-10-CM

## 2015-03-16 DIAGNOSIS — E1129 Type 2 diabetes mellitus with other diabetic kidney complication: Secondary | ICD-10-CM | POA: Insufficient documentation

## 2015-03-16 DIAGNOSIS — E118 Type 2 diabetes mellitus with unspecified complications: Secondary | ICD-10-CM | POA: Insufficient documentation

## 2015-03-16 HISTORY — DX: Nontraumatic intracerebral hemorrhage in cerebellum: I61.4

## 2015-03-16 NOTE — Assessment & Plan Note (Signed)
Discussed acute and chronic issues. Reviewed health maintenance measures, PFSHx, and immunizations. Obtain routine labs IN 2 months- TSH, Lipid panel, CBC w/ diff, A1c, and CMET.

## 2015-03-16 NOTE — Assessment & Plan Note (Signed)
Stable on ASA 81 mg, Benazepril 10 mg, and Lisinopril 5 mg. Continue.   BP Readings from Last 3 Encounters:  03/03/15 108/68  09/28/14 121/62  08/18/13 128/72

## 2015-03-16 NOTE — Assessment & Plan Note (Addendum)
Stable. On Atorvastatin 80 mg daily and fenofibrate 160 mg daily. Get updated Lipid panel in 2 months

## 2015-03-16 NOTE — Assessment & Plan Note (Addendum)
Stroke in April pt reports. Seen at [facility]. Pt having another Cardiologist evaluate her carotid artery stenosis.

## 2015-03-16 NOTE — Assessment & Plan Note (Signed)
Stable. Need updated A1c in 2 months. Continue checking BS, working on diet and exercise. Will obtain records from previous facility

## 2015-03-28 ENCOUNTER — Telehealth: Payer: Self-pay | Admitting: Nurse Practitioner

## 2015-03-28 NOTE — Telephone Encounter (Signed)
Pt requested to have lab work before next appt. No order for labs in system. Please advise/msn

## 2015-03-29 ENCOUNTER — Other Ambulatory Visit (HOSPITAL_COMMUNITY): Payer: Medicare Other

## 2015-03-29 ENCOUNTER — Ambulatory Visit: Payer: Medicare Other | Admitting: Vascular Surgery

## 2015-04-01 ENCOUNTER — Other Ambulatory Visit: Payer: Self-pay | Admitting: Nurse Practitioner

## 2015-04-01 DIAGNOSIS — E785 Hyperlipidemia, unspecified: Secondary | ICD-10-CM

## 2015-04-01 DIAGNOSIS — E118 Type 2 diabetes mellitus with unspecified complications: Secondary | ICD-10-CM

## 2015-04-01 DIAGNOSIS — Z1329 Encounter for screening for other suspected endocrine disorder: Secondary | ICD-10-CM

## 2015-04-01 DIAGNOSIS — Z130 Encounter for screening for diseases of the blood and blood-forming organs and certain disorders involving the immune mechanism: Secondary | ICD-10-CM

## 2015-04-01 NOTE — Telephone Encounter (Signed)
Please let pt know that she has lab orders in the computer and to make a fasting lab appointment. Thanks!

## 2015-04-04 ENCOUNTER — Telehealth: Payer: Self-pay | Admitting: Nurse Practitioner

## 2015-04-04 NOTE — Telephone Encounter (Signed)
Left msg for pt to call office to schedule fasting lab appt/msn

## 2015-04-04 NOTE — Telephone Encounter (Signed)
Pt schedule. Lab appt 04/20/15

## 2015-04-04 NOTE — Telephone Encounter (Signed)
Ok I will call pt.

## 2015-04-20 ENCOUNTER — Telehealth: Payer: Self-pay | Admitting: Nurse Practitioner

## 2015-04-20 ENCOUNTER — Other Ambulatory Visit (INDEPENDENT_AMBULATORY_CARE_PROVIDER_SITE_OTHER): Payer: Medicare Other

## 2015-04-20 ENCOUNTER — Other Ambulatory Visit: Payer: Self-pay | Admitting: *Deleted

## 2015-04-20 DIAGNOSIS — Z1329 Encounter for screening for other suspected endocrine disorder: Secondary | ICD-10-CM | POA: Diagnosis not present

## 2015-04-20 DIAGNOSIS — Z130 Encounter for screening for diseases of the blood and blood-forming organs and certain disorders involving the immune mechanism: Secondary | ICD-10-CM

## 2015-04-20 DIAGNOSIS — E785 Hyperlipidemia, unspecified: Secondary | ICD-10-CM | POA: Diagnosis not present

## 2015-04-20 DIAGNOSIS — E118 Type 2 diabetes mellitus with unspecified complications: Secondary | ICD-10-CM | POA: Diagnosis not present

## 2015-04-20 LAB — COMPREHENSIVE METABOLIC PANEL
ALK PHOS: 54 U/L (ref 39–117)
ALT: 30 U/L (ref 0–35)
AST: 31 U/L (ref 0–37)
Albumin: 4.1 g/dL (ref 3.5–5.2)
BUN: 34 mg/dL — ABNORMAL HIGH (ref 6–23)
CO2: 27 meq/L (ref 19–32)
Calcium: 10.2 mg/dL (ref 8.4–10.5)
Chloride: 107 mEq/L (ref 96–112)
Creatinine, Ser: 1.27 mg/dL — ABNORMAL HIGH (ref 0.40–1.20)
GFR: 43.42 mL/min — AB (ref >60.00)
Glucose, Bld: 146 mg/dL — ABNORMAL HIGH (ref 70–99)
POTASSIUM: 4.5 meq/L (ref 3.5–5.1)
Sodium: 141 mEq/L (ref 135–145)
TOTAL PROTEIN: 6.9 g/dL (ref 6.0–8.3)
Total Bilirubin: 0.6 mg/dL (ref 0.2–1.2)

## 2015-04-20 LAB — LIPID PANEL
CHOL/HDL RATIO: 4
Cholesterol: 111 mg/dL (ref 0–200)
HDL: 26.3 mg/dL — ABNORMAL LOW (ref >39.00)
LDL Cholesterol: 57 mg/dL (ref 0–99)
NONHDL: 84.49
Triglycerides: 139 mg/dL (ref 0.0–149.0)
VLDL: 27.8 mg/dL (ref 0.0–40.0)

## 2015-04-20 LAB — CBC WITH DIFFERENTIAL/PLATELET
Basophils Absolute: 0 10*3/uL (ref 0.0–0.1)
Basophils Relative: 0.5 % (ref 0.0–3.0)
Eosinophils Absolute: 0.2 10*3/uL (ref 0.0–0.7)
Eosinophils Relative: 2.8 % (ref 0.0–5.0)
HCT: 38.3 % (ref 36.0–46.0)
Hemoglobin: 12.9 g/dL (ref 12.0–15.0)
LYMPHS ABS: 3.3 10*3/uL (ref 0.7–4.0)
Lymphocytes Relative: 49 % — ABNORMAL HIGH (ref 12.0–46.0)
MCHC: 33.5 g/dL (ref 30.0–36.0)
MCV: 99.2 fl (ref 78.0–100.0)
Monocytes Absolute: 0.5 10*3/uL (ref 0.1–1.0)
Monocytes Relative: 6.7 % (ref 3.0–12.0)
NEUTROS PCT: 41 % — AB (ref 43.0–77.0)
Neutro Abs: 2.8 10*3/uL (ref 1.4–7.7)
Platelets: 223 10*3/uL (ref 150.0–400.0)
RBC: 3.86 Mil/uL — AB (ref 3.87–5.11)
RDW: 14.5 % (ref 11.5–15.5)
WBC: 6.7 10*3/uL (ref 4.0–10.5)

## 2015-04-20 LAB — TSH: TSH: 1.7 u[IU]/mL (ref 0.35–4.50)

## 2015-04-20 LAB — HEMOGLOBIN A1C: HEMOGLOBIN A1C: 6.9 % — AB (ref 4.6–6.5)

## 2015-04-20 MED ORDER — BENAZEPRIL HCL 5 MG PO TABS: 5.0000 mg | ORAL_TABLET | Freq: Every day | ORAL | Status: DC

## 2015-04-20 NOTE — Telephone Encounter (Signed)
Okay to send in with 1 refill.

## 2015-04-20 NOTE — Telephone Encounter (Addendum)
Needing a refill on benazepril (LOTENSIN) 5 MG tablet. The patient is needing 5 mg instead of 10 mg.

## 2015-04-20 NOTE — Telephone Encounter (Signed)
rx changed and sent

## 2015-04-20 NOTE — Telephone Encounter (Signed)
Please advise decrease in medication dose.

## 2015-04-21 ENCOUNTER — Other Ambulatory Visit: Payer: Self-pay | Admitting: Surgical

## 2015-04-21 MED ORDER — BENAZEPRIL HCL 5 MG PO TABS: 10.0000 mg | ORAL_TABLET | Freq: Every day | ORAL | Status: DC

## 2015-04-29 DIAGNOSIS — E119 Type 2 diabetes mellitus without complications: Secondary | ICD-10-CM | POA: Diagnosis not present

## 2015-04-29 LAB — HM DIABETES EYE EXAM

## 2015-05-04 ENCOUNTER — Ambulatory Visit (INDEPENDENT_AMBULATORY_CARE_PROVIDER_SITE_OTHER): Payer: Medicare Other | Admitting: Nurse Practitioner

## 2015-05-04 VITALS — BP 106/58 | HR 79 | Temp 98.0°F | Resp 14 | Ht 66.0 in | Wt 125.8 lb

## 2015-05-04 DIAGNOSIS — I10 Essential (primary) hypertension: Secondary | ICD-10-CM

## 2015-05-04 DIAGNOSIS — E785 Hyperlipidemia, unspecified: Secondary | ICD-10-CM | POA: Diagnosis not present

## 2015-05-04 DIAGNOSIS — E118 Type 2 diabetes mellitus with unspecified complications: Secondary | ICD-10-CM | POA: Diagnosis not present

## 2015-05-04 DIAGNOSIS — I6523 Occlusion and stenosis of bilateral carotid arteries: Secondary | ICD-10-CM | POA: Diagnosis not present

## 2015-05-04 NOTE — Progress Notes (Signed)
Pre visit review using our clinic review tool, if applicable. No additional management support is needed unless otherwise documented below in the visit note. 

## 2015-05-04 NOTE — Progress Notes (Signed)
Patient ID: Laurie Harris, female    DOB: 04/17/1941  Age: 74 y.o. MRN: [id number]  CC: Follow-up   HPI Laurie Harris presents for follow up of chronic illness.   1) BS all over the place, switched to canadian bacon, drinks water  Diet- eating healthier choices  August- fasting [phone] July- fasting [phone]  2) Sees Dr. Holley Raring in next month   3) Colonoscopy- 2015 Nov. See Care Everywhere   4) Mammogram July 2015- normal   BP at goal to lower end of normal Eye exam shows no diabetic retinopathy on August 26.  History Laurie Harris has a past medical history of Carotid artery occlusion; Dyspnea; Diabetes mellitus without complication; Hypertension; Hyperlipidemia; Stroke (2016); and Chicken pox.   She has past surgical history that includes Neck surgery.   Her family history includes Cancer in her father; Diabetes in her mother.She reports that she has never smoked. She has never used smokeless tobacco. She reports that she does not drink alcohol or use illicit drugs.  Outpatient Prescriptions Prior to Visit  Medication Sig Dispense Refill  . alendronate (FOSAMAX) 70 MG tablet Take 70 mg by mouth once a week. Take with a full glass of water on an empty stomach.    Marland Kitchen aspirin 81 MG tablet Take 81 mg by mouth daily.    Marland Kitchen atorvastatin (LIPITOR) 80 MG tablet Take 80 mg by mouth daily.    . benazepril (LOTENSIN) 5 MG tablet Take 2 tablets (10 mg total) by mouth daily. 180 tablet 1  . fenofibrate 160 MG tablet Take 160 mg by mouth daily.    . Insulin Lispro Prot & Lispro (HUMALOG MIX 75/25 KWIKPEN [place]) Inject 15 Units into the skin 2 (two) times daily before a meal.    . lisinopril (PRINIVIL,ZESTRIL) 5 MG tablet Take 5 mg by mouth daily.     No facility-administered medications prior to visit.    ROS Review of Systems  Constitutional: Negative for fever, chills, diaphoresis and fatigue.  Respiratory: Negative for chest tightness, shortness of breath and wheezing.   Cardiovascular:  Negative for chest pain, palpitations and leg swelling.  Gastrointestinal: Negative for nausea, vomiting and diarrhea.  Skin: Negative for rash.  Neurological: Negative for dizziness, weakness, numbness and headaches.  Psychiatric/Behavioral: The patient is not nervous/anxious.     Objective:  BP 106/58 mmHg  Pulse 79  Temp(Src) 98 F (36.7 C)  Resp 14  Ht 5\' 6"  (1.676 m)  Wt 125 lb 12.8 oz (57.063 kg)  BMI 20.31 kg/m2  SpO2 97%  Physical Exam  Constitutional: She is oriented to person, place, and time. She appears well-developed and well-nourished. No distress.  HENT:  Head: Normocephalic and atraumatic.  Right Ear: External ear normal.  Left Ear: External ear normal.  Cardiovascular: Normal rate, regular rhythm and normal heart sounds.   Pulmonary/Chest: Effort normal and breath sounds normal. No respiratory distress. She has no wheezes. She has no rales. She exhibits no tenderness.  Neurological: She is alert and oriented to person, place, and time. No cranial nerve deficit. She exhibits normal muscle tone. Coordination normal.  Skin: Skin is warm and dry. No rash noted. She is not diaphoretic.  Psychiatric: She has a normal mood and affect. Her behavior is normal. Judgment and thought content normal.   Assessment & Plan:   Laurie Harris was seen today for follow-up.  Diagnoses and all orders for this visit:  Diabetes mellitus type 2, controlled, with complications  Hyperlipidemia  Benign essential HTN  I have discontinued Laurie Harris's lisinopril. I am also having her maintain her aspirin, fenofibrate, alendronate, atorvastatin, Insulin Lispro Prot & Lispro (HUMALOG MIX 75/25 KWIKPEN [place]), benazepril, and pantoprazole.  Meds ordered this encounter  Medications  . pantoprazole (PROTONIX) 40 MG tablet    Sig: Take 40 mg by mouth daily.     Follow-up: Return in about 3 months (around 08/03/2015).

## 2015-05-21 ENCOUNTER — Encounter: Payer: Self-pay | Admitting: Nurse Practitioner

## 2015-05-21 NOTE — Assessment & Plan Note (Signed)
BP Readings from Last 3 Encounters:  05/04/15 106/58  03/03/15 108/68  09/28/14 121/62   Lab Results  Component Value Date   CREATININE 1.27* 04/20/2015     Patient is currently stable continue current regimen.

## 2015-05-21 NOTE — Assessment & Plan Note (Signed)
Patient had updated A1c last week. 6.9 patient is at goal. Continue current regimen.

## 2015-05-21 NOTE — Assessment & Plan Note (Signed)
Lipid panel obtained last week. Patient to continue 80 mg of Lipitor, aspirin 81 mg, and fenofibrate 160 mg daily. Good cholesterol still not to goal.

## 2015-06-07 DIAGNOSIS — I10 Essential (primary) hypertension: Secondary | ICD-10-CM | POA: Diagnosis not present

## 2015-06-07 DIAGNOSIS — E559 Vitamin D deficiency, unspecified: Secondary | ICD-10-CM | POA: Diagnosis not present

## 2015-06-07 DIAGNOSIS — N183 Chronic kidney disease, stage 3 (moderate): Secondary | ICD-10-CM | POA: Diagnosis not present

## 2015-06-07 DIAGNOSIS — R809 Proteinuria, unspecified: Secondary | ICD-10-CM | POA: Diagnosis not present

## 2015-08-04 ENCOUNTER — Ambulatory Visit (INDEPENDENT_AMBULATORY_CARE_PROVIDER_SITE_OTHER): Payer: Medicare Other | Admitting: Nurse Practitioner

## 2015-08-04 ENCOUNTER — Encounter: Payer: Self-pay | Admitting: Nurse Practitioner

## 2015-08-04 VITALS — BP 120/60 | HR 86 | Temp 98.0°F | Wt 127.0 lb

## 2015-08-04 DIAGNOSIS — I6523 Occlusion and stenosis of bilateral carotid arteries: Secondary | ICD-10-CM | POA: Diagnosis not present

## 2015-08-04 DIAGNOSIS — E118 Type 2 diabetes mellitus with unspecified complications: Secondary | ICD-10-CM | POA: Diagnosis not present

## 2015-08-04 DIAGNOSIS — Z794 Long term (current) use of insulin: Secondary | ICD-10-CM | POA: Diagnosis not present

## 2015-08-04 LAB — MICROALBUMIN / CREATININE URINE RATIO
CREATININE, U: 69.1 mg/dL
MICROALB/CREAT RATIO: 1 mg/g (ref 0.0–30.0)

## 2015-08-04 LAB — HEMOGLOBIN A1C: HEMOGLOBIN A1C: 7.7 % — AB (ref 4.6–6.5)

## 2015-08-04 NOTE — Patient Instructions (Addendum)
Cut down to 1,200 of the Fish oil once daily (not three times daily).   We will check fasting (nothing to eat or drink after midnight- labs next visit in 3 months).   If your A1c comes back elevated I will add the referral for nutrition. If it is between 7-8 I will not place a referral for nutrition. Watch crackers, pasta, rice, and sugar.

## 2015-08-04 NOTE — Progress Notes (Signed)
Patient ID: Laurie Harris, female    DOB: June 29, 1941  Age: 74 y.o. MRN: [id number]  CC: Follow-up   HPI Laurie Harris presents for follow up of diabetes.   1) Pt and husband are here today with questions about blood sugars. She brings a sheet with her recorded BS and they range from 120's to 250's depending on the meal. Her dinner time meal is recorded and includes a lot of starches. She regularly eats pasta, bread, and potatoes. She reports eating the same wendy's apple salad for lunch with ranch dressing she brings from home her concern is that her numbers still fluctuate even with eating the same meal for lunch daily.   15 units at 7 am and 7 pm   No other concerns or need for refills per pt   History Laurie Harris has a past medical history of Carotid artery occlusion; Dyspnea; Diabetes mellitus without complication ([place]); Hypertension; Hyperlipidemia; Stroke ([place]) (2016); and Chicken pox.   She has past surgical history that includes Neck surgery.   Her family history includes Cancer in her father; Diabetes in her mother.She reports that she has never smoked. She has never used smokeless tobacco. She reports that she does not drink alcohol or use illicit drugs.  Outpatient Prescriptions Prior to Visit  Medication Sig Dispense Refill  . alendronate (FOSAMAX) 70 MG tablet Take 70 mg by mouth once a week. Take with a full glass of water on an empty stomach.    Marland Kitchen aspirin 81 MG tablet Take 81 mg by mouth daily.    Marland Kitchen atorvastatin (LIPITOR) 80 MG tablet Take 80 mg by mouth daily.    . benazepril (LOTENSIN) 5 MG tablet Take 2 tablets (10 mg total) by mouth daily. 180 tablet 1  . fenofibrate 160 MG tablet Take 160 mg by mouth daily.    . Insulin Lispro Prot & Lispro (HUMALOG MIX 75/25 KWIKPEN [place]) Inject 15 Units into the skin 2 (two) times daily before a meal.    . pantoprazole (PROTONIX) 40 MG tablet Take 40 mg by mouth daily.     No facility-administered medications prior to visit.     ROS Review of Systems  Constitutional: Negative for fever, chills, diaphoresis and fatigue.  Respiratory: Negative for chest tightness, shortness of breath and wheezing.   Cardiovascular: Negative for chest pain, palpitations and leg swelling.  Gastrointestinal: Negative for nausea, vomiting and diarrhea.  Endocrine: Negative for polydipsia, polyphagia and polyuria.  Skin: Negative for rash.  Neurological: Negative for dizziness, weakness, numbness and headaches.  Psychiatric/Behavioral: The patient is not nervous/anxious.    Objective:  BP 120/60 mmHg  Pulse 86  Temp(Src) 98 F (36.7 C) (Oral)  Wt 127 lb (57.607 kg)  SpO2 95%  Physical Exam  Constitutional: She is oriented to person, place, and time. She appears well-developed and well-nourished. No distress.  HENT:  Head: Normocephalic and atraumatic.  Right Ear: External ear normal.  Left Ear: External ear normal.  Cardiovascular: Normal rate, regular rhythm and normal heart sounds.  Exam reveals no gallop and no friction rub.   No murmur heard. Pulmonary/Chest: Effort normal and breath sounds normal. No respiratory distress. She has no wheezes. She has no rales. She exhibits no tenderness.  Neurological: She is alert and oriented to person, place, and time. No cranial nerve deficit. She exhibits normal muscle tone. Coordination normal.  Skin: Skin is warm and dry. No rash noted. She is not diaphoretic.  Psychiatric: She has a normal mood and affect.  Her behavior is normal. Judgment and thought content normal.   Assessment & Plan:   Laurie Harris was seen today for follow-up.  Diagnoses and all orders for this visit:  Controlled type 2 diabetes mellitus with complication, with long-term current use of insulin ([place]) -     Cancel: HgB A1c -     HgB A1c -     Urine Microalbumin w/creat. ratio   I am having [name] maintain her aspirin, fenofibrate, alendronate, atorvastatin, Insulin Lispro Prot & Lispro (HUMALOG MIX  75/25 KWIKPEN [place]), benazepril, and pantoprazole.  No orders of the defined types were placed in this encounter.     Follow-up: Return in about 3 months (around 11/02/2015) for Fasting labs and follow up.

## 2015-08-17 NOTE — Assessment & Plan Note (Signed)
A1c is 7.7 this visit. Will obtain Urine microalbumin. Suggested seeing nutrition, but will wait due to A1c still being below 8 and at her age she does not wish to change much of her diet. Suggested that the apples vary in amount and size on the salad and this could contribute to fluctuating numbers. Also, asked her to watch startches and carbs at dinner. Focus on veggies like green beans, tomatoes, peppers ect... Fruits are okay in moderation. Water intake mostly and watch dessert. Pt is agreeable. Will follow  Lab Results  Component Value Date   HGBA1C 7.7* 08/04/2015

## 2015-09-12 ENCOUNTER — Telehealth: Payer: Self-pay | Admitting: Nurse Practitioner

## 2015-09-12 MED ORDER — ATORVASTATIN CALCIUM 80 MG PO TABS: 80.0000 mg | ORAL_TABLET | Freq: Every day | ORAL | Status: DC

## 2015-09-12 MED ORDER — FENOFIBRATE 160 MG PO TABS: 160.0000 mg | ORAL_TABLET | Freq: Every day | ORAL | Status: DC

## 2015-09-12 NOTE — Telephone Encounter (Signed)
Pt lvm requesting refills of two medications that need to be sent to [facility]. Atorvastatin tab 80 mg ([facility] fills for 90 tabs so she isn't sure how [facility] will fill them) Fenofibrate tab 160 mg (90 day as well)

## 2015-10-04 DIAGNOSIS — R809 Proteinuria, unspecified: Secondary | ICD-10-CM | POA: Diagnosis not present

## 2015-10-04 DIAGNOSIS — N183 Chronic kidney disease, stage 3 (moderate): Secondary | ICD-10-CM | POA: Diagnosis not present

## 2015-10-04 DIAGNOSIS — I10 Essential (primary) hypertension: Secondary | ICD-10-CM | POA: Diagnosis not present

## 2015-10-04 DIAGNOSIS — E559 Vitamin D deficiency, unspecified: Secondary | ICD-10-CM | POA: Diagnosis not present

## 2015-10-10 DIAGNOSIS — E559 Vitamin D deficiency, unspecified: Secondary | ICD-10-CM | POA: Diagnosis not present

## 2015-10-10 DIAGNOSIS — N183 Chronic kidney disease, stage 3 (moderate): Secondary | ICD-10-CM | POA: Diagnosis not present

## 2015-10-10 DIAGNOSIS — I10 Essential (primary) hypertension: Secondary | ICD-10-CM | POA: Diagnosis not present

## 2015-10-17 ENCOUNTER — Other Ambulatory Visit: Payer: Self-pay

## 2015-10-17 MED ORDER — ALENDRONATE SODIUM 70 MG PO TABS: 70.0000 mg | ORAL_TABLET | ORAL | Status: DC

## 2015-10-17 NOTE — Telephone Encounter (Signed)
Please advise refill, you have not prescribed this before for her? thanks

## 2015-11-03 ENCOUNTER — Encounter: Payer: Self-pay | Admitting: Nurse Practitioner

## 2015-11-03 ENCOUNTER — Ambulatory Visit (INDEPENDENT_AMBULATORY_CARE_PROVIDER_SITE_OTHER): Payer: Medicare Other | Admitting: Nurse Practitioner

## 2015-11-03 VITALS — BP 124/68 | HR 73 | Temp 97.6°F | Resp 14 | Ht 66.0 in | Wt 126.6 lb

## 2015-11-03 DIAGNOSIS — Z794 Long term (current) use of insulin: Secondary | ICD-10-CM

## 2015-11-03 DIAGNOSIS — E118 Type 2 diabetes mellitus with unspecified complications: Secondary | ICD-10-CM

## 2015-11-03 NOTE — Progress Notes (Signed)
Patient ID: Laurie Harris, female    DOB: 11-Oct-1940  Age: 75 y.o. MRN: [id number]  CC: Diabetes   HPI Laurie Harris presents for CC of diabetes 3 month follow up.   1) A1c is 7.7 as of Dec. 2016  Pt was counseled on nutrition last visit  Last time BS were 120's-150's   Taking 15 units in the morning and in the evening  Eating at 7 am and 7 pm taking insulin 30 min prior to the meals BS are worsening 130's-280's    History Laurie Harris has a past medical history of Carotid artery occlusion; Dyspnea; Diabetes mellitus without complication ([place]); Hypertension; Hyperlipidemia; Stroke ([place]) (2016); and Chicken pox.   She has past surgical history that includes Neck surgery.   Her family history includes Cancer in her father; Diabetes in her mother.She reports that she has never smoked. She has never used smokeless tobacco. She reports that she does not drink alcohol or use illicit drugs.  Outpatient Prescriptions Prior to Visit  Medication Sig Dispense Refill  . alendronate (FOSAMAX) 70 MG tablet Take 1 tablet (70 mg total) by mouth once a week. Take with a full glass of water on an empty stomach. 4 tablet 2  . aspirin 81 MG tablet Take 81 mg by mouth daily.    Marland Kitchen atorvastatin (LIPITOR) 80 MG tablet Take 1 tablet (80 mg total) by mouth daily. 90 tablet 1  . benazepril (LOTENSIN) 5 MG tablet Take 2 tablets (10 mg total) by mouth daily. 180 tablet 1  . fenofibrate 160 MG tablet Take 1 tablet (160 mg total) by mouth daily. 90 tablet 1  . Insulin Lispro Prot & Lispro (HUMALOG MIX 75/25 KWIKPEN [place]) Inject 15 Units into the skin 2 (two) times daily before a meal.    . pantoprazole (PROTONIX) 40 MG tablet Take 40 mg by mouth daily.     No facility-administered medications prior to visit.    ROS Review of Systems  Constitutional: Negative for fever, chills, diaphoresis and fatigue.  Respiratory: Negative for chest tightness, shortness of breath and wheezing.   Cardiovascular: Negative for  chest pain, palpitations and leg swelling.  Gastrointestinal: Negative for nausea, vomiting and diarrhea.  Skin: Negative for rash.  Neurological: Negative for dizziness, weakness, numbness and headaches.  Psychiatric/Behavioral: The patient is not nervous/anxious.     Objective:  BP 124/68 mmHg  Pulse 73  Temp(Src) 97.6 F (36.4 C) (Oral)  Resp 14  Ht 5\' 6"  (1.676 m)  Wt 126 lb 9.6 oz (57.425 kg)  BMI 20.44 kg/m2  SpO2 97%  Physical Exam  Constitutional: She is oriented to person, place, and time. She appears well-developed and well-nourished. No distress.  HENT:  Head: Normocephalic and atraumatic.  Right Ear: External ear normal.  Left Ear: External ear normal.  Cardiovascular: Normal rate, regular rhythm and normal heart sounds.  Exam reveals no gallop and no friction rub.   No murmur heard. Pulmonary/Chest: Effort normal and breath sounds normal. No respiratory distress. She has no wheezes. She has no rales. She exhibits no tenderness.  Neurological: She is alert and oriented to person, place, and time. No cranial nerve deficit. She exhibits normal muscle tone. Coordination normal.  Skin: Skin is warm and dry. No rash noted. She is not diaphoretic.  Slight yellow bruising and thickening of skin at right of umbilicus where she injects her insulin consistently on that side  Psychiatric: She has a normal mood and affect. Her behavior is normal. Judgment and  thought content normal.   Assessment & Plan:   Laurie Harris was seen today for diabetes.  Diagnoses and all orders for this visit:  Controlled type 2 diabetes mellitus with complication, with long-term current use of insulin ([place])   I have discontinued Laurie Harris's glimepiride and warfarin. I am also having her maintain her aspirin, Insulin Lispro Prot & Lispro (HUMALOG MIX 75/25 KWIKPEN [place]), benazepril, pantoprazole, fenofibrate, atorvastatin, alendronate, NOVOFINE PLUS, amLODipine, and calcium elemental as carbonate.  Meds  ordered this encounter  Medications  . NOVOFINE PLUS 32G X 4 MM MISC    Sig:     Refill:  0  . amLODipine (NORVASC) 5 MG tablet    Sig:   . calcium elemental as carbonate (PX ANTACID MAXIMUM STRENGTH) 400 MG chewable tablet    Sig: Chew by mouth.  . DISCONTD: glimepiride (AMARYL) 4 MG tablet    Sig: Take by mouth.  . DISCONTD: warfarin (COUMADIN) 1 MG tablet    Sig: Take by mouth.     Follow-up: Return in about 3 months (around 02/03/2016) for Follow up for DM w/ re-check of A1c .

## 2015-11-03 NOTE — Assessment & Plan Note (Signed)
Upped to 17 units 15-30 minutes prior to eating am and pm  Advised her to rotate sites  Advised lower carb diet  FU next week by call for blood sugars  Advised call if lower than 90  FU in 3 months

## 2015-11-03 NOTE — Patient Instructions (Signed)
17 units twice daily am and pm 15-30 minutes before your meals (doesn't matter if 12 hours apart or at same time every day).   We will call next week for BS numbers.

## 2015-11-10 ENCOUNTER — Other Ambulatory Visit: Payer: Self-pay | Admitting: Nurse Practitioner

## 2015-11-10 NOTE — Telephone Encounter (Signed)
rx sent. In the last week her average Blood sugar reading from home were 150am-130pm. Patient notified to continue on 17 Units of insulin per Tera Partridge

## 2015-11-10 NOTE — Telephone Encounter (Signed)
-----   Message from Rubbie Battiest, NP sent at 11/10/2015  1:02 PM EST ----- Please call patient and ask for her most recent blood sugars from the past 3 days. Thanks!

## 2015-11-11 ENCOUNTER — Other Ambulatory Visit: Payer: Self-pay

## 2015-11-11 MED ORDER — BENAZEPRIL HCL 5 MG PO TABS: 10.0000 mg | ORAL_TABLET | Freq: Every day | ORAL | Status: DC

## 2015-12-15 ENCOUNTER — Other Ambulatory Visit: Payer: Self-pay | Admitting: Nurse Practitioner

## 2015-12-26 ENCOUNTER — Other Ambulatory Visit: Payer: Self-pay | Admitting: Nurse Practitioner

## 2016-01-09 ENCOUNTER — Telehealth: Payer: Self-pay | Admitting: Nurse Practitioner

## 2016-01-09 NOTE — Telephone Encounter (Signed)
Fine to schedule with me 55min

## 2016-01-09 NOTE — Telephone Encounter (Signed)
Dr. Gilford Rile pt request to switch to your care. This is a [facility] pt. Pt was advised that Dr. Lacinda Axon and Dr. Caryl Bis are accepting pts. Please advise.msn

## 2016-01-10 ENCOUNTER — Telehealth: Payer: Self-pay | Admitting: *Deleted

## 2016-01-10 NOTE — Telephone Encounter (Signed)
Please advise and schedule with Dr. Gilford Rile. Thanks

## 2016-01-10 NOTE — Telephone Encounter (Signed)
That is fine 

## 2016-01-10 NOTE — Telephone Encounter (Signed)
Scheduled

## 2016-01-10 NOTE — Telephone Encounter (Signed)
Please advise for new patient from Lorane Gell, thanks

## 2016-01-10 NOTE — Telephone Encounter (Signed)
Patient has requested to be a New Patient with Dr. Irving Copas a previous pt of Lorane Gell. Please advise.

## 2016-01-31 DIAGNOSIS — N183 Chronic kidney disease, stage 3 (moderate): Secondary | ICD-10-CM | POA: Diagnosis not present

## 2016-01-31 DIAGNOSIS — R809 Proteinuria, unspecified: Secondary | ICD-10-CM | POA: Diagnosis not present

## 2016-01-31 DIAGNOSIS — E1122 Type 2 diabetes mellitus with diabetic chronic kidney disease: Secondary | ICD-10-CM | POA: Diagnosis not present

## 2016-01-31 DIAGNOSIS — E559 Vitamin D deficiency, unspecified: Secondary | ICD-10-CM | POA: Diagnosis not present

## 2016-02-03 ENCOUNTER — Ambulatory Visit: Payer: Medicare Other | Admitting: Nurse Practitioner

## 2016-02-06 ENCOUNTER — Ambulatory Visit (INDEPENDENT_AMBULATORY_CARE_PROVIDER_SITE_OTHER): Payer: Medicare Other | Admitting: Internal Medicine

## 2016-02-06 ENCOUNTER — Encounter: Payer: Self-pay | Admitting: Internal Medicine

## 2016-02-06 VITALS — BP 150/72 | HR 83 | Ht 66.0 in | Wt 128.6 lb

## 2016-02-06 DIAGNOSIS — E118 Type 2 diabetes mellitus with unspecified complications: Secondary | ICD-10-CM

## 2016-02-06 DIAGNOSIS — I10 Essential (primary) hypertension: Secondary | ICD-10-CM

## 2016-02-06 DIAGNOSIS — E785 Hyperlipidemia, unspecified: Secondary | ICD-10-CM | POA: Diagnosis not present

## 2016-02-06 DIAGNOSIS — Z794 Long term (current) use of insulin: Secondary | ICD-10-CM

## 2016-02-06 DIAGNOSIS — I6523 Occlusion and stenosis of bilateral carotid arteries: Secondary | ICD-10-CM | POA: Diagnosis not present

## 2016-02-06 DIAGNOSIS — Z515 Encounter for palliative care: Secondary | ICD-10-CM | POA: Insufficient documentation

## 2016-02-06 DIAGNOSIS — Z1239 Encounter for other screening for malignant neoplasm of breast: Secondary | ICD-10-CM | POA: Insufficient documentation

## 2016-02-06 MED ORDER — BENAZEPRIL HCL 5 MG PO TABS: 10.0000 mg | ORAL_TABLET | Freq: Every day | ORAL | Status: DC

## 2016-02-06 NOTE — Assessment & Plan Note (Signed)
BP Readings from Last 3 Encounters:  02/06/16 150/72  11/03/15 124/68  08/04/15 120/60   BP generally well controlled. Will request recent renal function. Continue current medication.

## 2016-02-06 NOTE — Assessment & Plan Note (Signed)
Will request recent lab results. Continue Atorvastatin and Fenofibrate.

## 2016-02-06 NOTE — Assessment & Plan Note (Signed)
Reviewed notes from [facility] and recent carotid US with Dr. Kellie Simmering. Will continue to monitor.

## 2016-02-06 NOTE — Progress Notes (Signed)
Subjective:    Patient ID: Laurie Harris, female    DOB: 01-Mar-1941, 75 y.o.   MRN: [id number]  HPI  75YO female presents to establish care. She was previously followed by Lorane Gell.  Last seen in 11/2015 for diabetes.  DM - BG have mostly 120-150s. Compliant with Humalog 75-25, 17 units bid. Had A1c last week and it was 6.5%. Limiting carb intake. Followed by [facility]. Last seen in July. Notes some decline in vision close up. Using reading glasses.  S/p hemorrhagic stroke. Treated at [facility].  HTN - BP mostly 110s/60s. Compliant with medication.  Declines flu shot and pneumonia shot.   Wt Readings from Last 3 Encounters:  02/06/16 128 lb 9.6 oz (58.333 kg)  11/03/15 126 lb 9.6 oz (57.425 kg)  08/04/15 127 lb (57.607 kg)   BP Readings from Last 3 Encounters:  02/06/16 150/72  11/03/15 124/68  08/04/15 120/60    Past Medical History  Diagnosis Date  . Carotid artery occlusion   . Dyspnea   . Diabetes mellitus without complication ([place])   . Hypertension   . Hyperlipidemia   . Stroke ([place]) 2016  . Chicken pox    Family History  Problem Relation Age of Onset  . Diabetes Mother   . Cancer Father     colon   Past Surgical History  Procedure Laterality Date  . Neck surgery     Social History   Social History  . Marital Status: Married    Spouse Name: N/A  . Number of Children: N/A  . Years of Education: N/A   Social History Main Topics  . Smoking status: Never Smoker   . Smokeless tobacco: Never Used  . Alcohol Use: No  . Drug Use: No  . Sexual Activity: Not Asked   Other Topics Concern  . None   Social History Narrative    Review of Systems  Constitutional: Negative for fever, chills, appetite change, fatigue and unexpected weight change.  Eyes: Negative for visual disturbance.  Respiratory: Negative for shortness of breath.   Cardiovascular: Negative for chest pain and leg swelling.  Gastrointestinal: Negative for nausea, vomiting,  abdominal pain, diarrhea and constipation.  Musculoskeletal: Negative for myalgias and arthralgias.  Skin: Negative for color change and rash.  Neurological: Positive for weakness.  Hematological: Negative for adenopathy. Does not bruise/bleed easily.  Psychiatric/Behavioral: Negative for suicidal ideas, sleep disturbance and dysphoric mood. The patient is not nervous/anxious.        Objective:    BP 150/72 mmHg  Pulse 83  Ht 5\' 6"  (1.676 m)  Wt 128 lb 9.6 oz (58.333 kg)  BMI 20.77 kg/m2  SpO2 98% Physical Exam  Constitutional: She is oriented to person, place, and time. She appears well-developed and well-nourished. No distress.  HENT:  Head: Normocephalic and atraumatic.  Right Ear: External ear normal.  Left Ear: External ear normal.  Nose: Nose normal.  Mouth/Throat: Oropharynx is clear and moist. No oropharyngeal exudate.  Eyes: Conjunctivae are normal. Pupils are equal, round, and reactive to light. Right eye exhibits no discharge. Left eye exhibits no discharge. No scleral icterus.  Neck: Normal range of motion. Neck supple. No tracheal deviation present. No thyromegaly present.  Cardiovascular: Normal rate, regular rhythm, normal heart sounds and intact distal pulses.  Exam reveals no gallop and no friction rub.   No murmur heard. Pulmonary/Chest: Effort normal and breath sounds normal. No respiratory distress. She has no wheezes. She has no rales. She exhibits no tenderness.  Musculoskeletal: Normal range of motion. She exhibits no edema or tenderness.  Lymphadenopathy:    She has no cervical adenopathy.  Neurological: She is alert and oriented to person, place, and time. No cranial nerve deficit. She exhibits normal muscle tone. Coordination normal.  Skin: Skin is warm and dry. No rash noted. She is not diaphoretic. No erythema. No pallor.  Psychiatric: She has a normal mood and affect. Her behavior is normal. Judgment and thought content normal.            Assessment & Plan:   Problem List Items Addressed This Visit      Unprioritized   Benign essential HTN    BP Readings from Last 3 Encounters:  02/06/16 150/72  11/03/15 124/68  08/04/15 120/60   BP generally well controlled. Will request recent renal function. Continue current medication.      Relevant Medications   benazepril (LOTENSIN) 5 MG tablet   Carotid artery occlusion without infarction    Reviewed notes from [facility] and recent carotid US with Dr. Kellie Simmering. Will continue to monitor.      Relevant Medications   benazepril (LOTENSIN) 5 MG tablet   Diabetes mellitus type 2, controlled, with complications ([place]) - Primary    BG well controlled by report. Will continue Humalog 75-25, 17 units bid. Will request recent A1c from Dr. Holley Raring.      Relevant Medications   benazepril (LOTENSIN) 5 MG tablet   Other Relevant Orders   Ambulatory referral to Ophthalmology   Hyperlipidemia    Will request recent lab results. Continue Atorvastatin and Fenofibrate.      Relevant Medications   benazepril (LOTENSIN) 5 MG tablet   Screening for breast cancer   Relevant Orders   MM Digital Screening       Return in about 3 months (around 05/08/2016) for Recheck of Diabetes.  Ronette Deter, MD Internal Medicine [facility]

## 2016-02-06 NOTE — Patient Instructions (Addendum)
We will set up a mammogram and eye exam.  Follow up here in 3 months.

## 2016-02-06 NOTE — Assessment & Plan Note (Addendum)
BG well controlled by report. Will continue Humalog 75-25, 17 units bid. Will request recent A1c from Dr. Holley Raring.

## 2016-03-10 ENCOUNTER — Other Ambulatory Visit: Payer: Self-pay | Admitting: Internal Medicine

## 2016-03-10 ENCOUNTER — Other Ambulatory Visit: Payer: Self-pay | Admitting: Nurse Practitioner

## 2016-03-15 ENCOUNTER — Other Ambulatory Visit: Payer: Self-pay | Admitting: Internal Medicine

## 2016-03-16 ENCOUNTER — Ambulatory Visit
Admission: RE | Admit: 2016-03-16 | Discharge: 2016-03-16 | Disposition: A | Discharge status: Home / Self Care | Payer: Medicare Other | Source: Ambulatory Visit | Attending: Internal Medicine | Admitting: Internal Medicine

## 2016-03-16 ENCOUNTER — Other Ambulatory Visit: Payer: Self-pay | Admitting: Internal Medicine

## 2016-03-16 DIAGNOSIS — Z1239 Encounter for other screening for malignant neoplasm of breast: Secondary | ICD-10-CM | POA: Insufficient documentation

## 2016-03-16 DIAGNOSIS — Z1231 Encounter for screening mammogram for malignant neoplasm of breast: Secondary | ICD-10-CM | POA: Diagnosis not present

## 2016-05-04 ENCOUNTER — Encounter: Payer: Self-pay | Admitting: Family Medicine

## 2016-05-04 DIAGNOSIS — E113293 Type 2 diabetes mellitus with mild nonproliferative diabetic retinopathy without macular edema, bilateral: Secondary | ICD-10-CM | POA: Diagnosis not present

## 2016-05-04 LAB — HM DIABETES EYE EXAM

## 2016-05-08 ENCOUNTER — Encounter: Payer: Self-pay | Admitting: Surgical

## 2016-05-09 ENCOUNTER — Ambulatory Visit (INDEPENDENT_AMBULATORY_CARE_PROVIDER_SITE_OTHER): Payer: Medicare Other | Admitting: Family Medicine

## 2016-05-09 ENCOUNTER — Encounter: Payer: Self-pay | Admitting: Family Medicine

## 2016-05-09 ENCOUNTER — Ambulatory Visit: Payer: Medicare Other | Admitting: Internal Medicine

## 2016-05-09 VITALS — BP 136/68 | HR 82 | Temp 97.8°F | Wt 130.8 lb

## 2016-05-09 DIAGNOSIS — E785 Hyperlipidemia, unspecified: Secondary | ICD-10-CM | POA: Diagnosis not present

## 2016-05-09 DIAGNOSIS — Z794 Long term (current) use of insulin: Secondary | ICD-10-CM

## 2016-05-09 DIAGNOSIS — E118 Type 2 diabetes mellitus with unspecified complications: Secondary | ICD-10-CM | POA: Diagnosis not present

## 2016-05-09 DIAGNOSIS — E1149 Type 2 diabetes mellitus with other diabetic neurological complication: Secondary | ICD-10-CM

## 2016-05-09 DIAGNOSIS — I10 Essential (primary) hypertension: Secondary | ICD-10-CM | POA: Diagnosis not present

## 2016-05-09 DIAGNOSIS — I6523 Occlusion and stenosis of bilateral carotid arteries: Secondary | ICD-10-CM

## 2016-05-09 DIAGNOSIS — I614 Nontraumatic intracerebral hemorrhage in cerebellum: Secondary | ICD-10-CM

## 2016-05-09 MED ORDER — BENAZEPRIL HCL 5 MG PO TABS: 5.0000 mg | ORAL_TABLET | Freq: Every day | ORAL | 1 refills | Status: DC

## 2016-05-09 NOTE — Assessment & Plan Note (Addendum)
Stable. No new deficits. Still some residual vertigo with bending over. She'll continue to monitor.

## 2016-05-09 NOTE — Assessment & Plan Note (Signed)
Blood pressure actually on the low side at home. Diastolically less than 60. We will have her decrease her benazepril to 5 mg daily. Discussed goal of less than 130/80 and greater than 100/60. They will continue to monitor her blood pressure.

## 2016-05-09 NOTE — Progress Notes (Signed)
Pre visit review using our clinic review tool, if applicable. No additional management support is needed unless otherwise documented below in the visit note. 

## 2016-05-09 NOTE — Assessment & Plan Note (Signed)
Tolerating medication. Continue Lipitor. 

## 2016-05-09 NOTE — Assessment & Plan Note (Signed)
At goal. Continue current medications. We'll check an A1c with fasting lab work.

## 2016-05-09 NOTE — Progress Notes (Signed)
  Tommi Rumps, MD Phone: [phone]  Laurie Harris is a 75 y.o. female who presents today for f/u.  HYPERTENSION Disease Monitoring: Blood pressure range-88-120/47-60 Chest pain- no      Dyspnea- no Medications: Compliance- taking benazepril 10 mg daily Lightheadedness,Syncope- no   Edema- no  DIABETES Disease Monitoring: Blood Sugar ranges-fasting 122-174, evening 107-143 Polyuria/phagia/dipsia- no      ophthalmology- saw last week Medications: Compliance- taking NovoLog 75-25 17 units twice daily Hypoglycemic symptoms- no  HYPERLIPIDEMIA Disease Monitoring: See symptoms for Hypertension Medications: Compliance- taking Lipitor Right upper quadrant pain- no  Muscle aches- no  History of hemorrhagic stroke: Patient notes no new deficits. Does note some residual minimal vertigo if she bends over. No numbness or weakness. Notes she saw neurology in February of this year. She went through a multitude of types of therapy following the stroke.  PMH: nonsmoker.   ROS see history of present illness  Objective  Physical Exam Vitals:   05/09/16 0959  BP: 136/68  Pulse: 82  Temp: 97.8 F (36.6 C)    BP Readings from Last 3 Encounters:  05/09/16 136/68  02/06/16 (!) 150/72  11/03/15 124/68   Wt Readings from Last 3 Encounters:  05/09/16 130 lb 12.8 oz (59.3 kg)  02/06/16 128 lb 9.6 oz (58.3 kg)  11/03/15 126 lb 9.6 oz (57.4 kg)    Physical Exam  Constitutional: No distress.  HENT:  Head: Normocephalic and atraumatic.  Mouth/Throat: Oropharynx is clear and moist. No oropharyngeal exudate.  Eyes: Conjunctivae are normal. Pupils are equal, round, and reactive to light.  Cardiovascular: Normal rate and regular rhythm.   Pulmonary/Chest: Effort normal and breath sounds normal.  Musculoskeletal: She exhibits no edema.  Neurological: She is alert.  CN 2-12 intact, 5/5 strength in bilateral biceps, triceps, grip, quads, hamstrings, plantar and dorsiflexion, sensation  to light touch intact in bilateral UE and LE, deliberate gait, 2+ patellar reflexes, no pronator drift, imbalance on Romberg  Skin: Skin is warm and dry. She is not diaphoretic.     Assessment/Plan: Please see individual problem list.  Benign essential HTN Blood pressure actually on the low side at home. Diastolically less than 60. We will have her decrease her benazepril to 5 mg daily. Discussed goal of less than 130/80 and greater than 100/60. They will continue to monitor her blood pressure.  Diabetes mellitus type 2, controlled, with complications At goal. Continue current medications. We'll check an A1c with fasting lab work.  Cerebellar hemorrhage Stable. No new deficits. Still some residual vertigo with bending over. She'll continue to monitor.  Hyperlipidemia Tolerating medication. Continue Lipitor.   Orders Placed This Encounter  Procedures  . HgB A1c    Standing Status:   Future    Standing Expiration Date:   05/09/2017  . Lipid Profile    Standing Status:   Future    Standing Expiration Date:   05/09/2017  . Comp Met (CMET)    Standing Status:   Future    Standing Expiration Date:   05/09/2017    Meds ordered this encounter  Medications  . benazepril (LOTENSIN) 5 MG tablet    Sig: Take 1 tablet (5 mg total) by mouth daily.    Dispense:  180 tablet    Refill:  [place], MD [place]

## 2016-05-09 NOTE — Patient Instructions (Signed)
Nice to meet you. We are going to decrease your benazepril to 5 mg daily. Please continue your current insulin dosing. We will check some lab work when you are fasting and call you when the results come in.

## 2016-05-10 ENCOUNTER — Encounter: Payer: Self-pay | Admitting: Family Medicine

## 2016-05-10 ENCOUNTER — Other Ambulatory Visit (INDEPENDENT_AMBULATORY_CARE_PROVIDER_SITE_OTHER): Payer: Medicare Other

## 2016-05-10 DIAGNOSIS — E1149 Type 2 diabetes mellitus with other diabetic neurological complication: Secondary | ICD-10-CM | POA: Diagnosis not present

## 2016-05-10 DIAGNOSIS — N183 Chronic kidney disease, stage 3 (moderate): Secondary | ICD-10-CM

## 2016-05-10 DIAGNOSIS — N1831 Chronic kidney disease, stage 3a: Secondary | ICD-10-CM | POA: Insufficient documentation

## 2016-05-10 DIAGNOSIS — E785 Hyperlipidemia, unspecified: Secondary | ICD-10-CM

## 2016-05-10 DIAGNOSIS — Z794 Long term (current) use of insulin: Secondary | ICD-10-CM | POA: Diagnosis not present

## 2016-05-10 DIAGNOSIS — N179 Acute kidney failure, unspecified: Secondary | ICD-10-CM | POA: Insufficient documentation

## 2016-05-10 LAB — COMPREHENSIVE METABOLIC PANEL
ALT: 25 U/L (ref 0–35)
AST: 25 U/L (ref 0–37)
Albumin: 4.3 g/dL (ref 3.5–5.2)
Alkaline Phosphatase: 52 U/L (ref 39–117)
BUN: 33 mg/dL — ABNORMAL HIGH (ref 6–23)
CALCIUM: 10 mg/dL (ref 8.4–10.5)
CHLORIDE: 107 meq/L (ref 96–112)
CO2: 29 meq/L (ref 19–32)
Creatinine, Ser: 1.36 mg/dL — ABNORMAL HIGH (ref 0.40–1.20)
GFR: 40.01 mL/min — AB (ref >60.00)
GLUCOSE: 185 mg/dL — AB (ref 70–99)
Potassium: 4.7 mEq/L (ref 3.5–5.1)
Sodium: 141 mEq/L (ref 135–145)
Total Bilirubin: 0.5 mg/dL (ref 0.2–1.2)
Total Protein: 7.1 g/dL (ref 6.0–8.3)

## 2016-05-10 LAB — LIPID PANEL
CHOL/HDL RATIO: 4
Cholesterol: 124 mg/dL (ref 0–200)
HDL: 34.5 mg/dL — AB (ref >39.00)
LDL CALC: 58 mg/dL (ref 0–99)
NONHDL: 89.52
TRIGLYCERIDES: 156 mg/dL — AB (ref 0.0–149.0)
VLDL: 31.2 mg/dL (ref 0.0–40.0)

## 2016-05-10 LAB — HEMOGLOBIN A1C: Hgb A1c MFr Bld: 6.6 % — ABNORMAL HIGH (ref 4.6–6.5)

## 2016-05-23 DIAGNOSIS — N183 Chronic kidney disease, stage 3 (moderate): Secondary | ICD-10-CM | POA: Diagnosis not present

## 2016-05-23 DIAGNOSIS — E559 Vitamin D deficiency, unspecified: Secondary | ICD-10-CM | POA: Diagnosis not present

## 2016-05-23 DIAGNOSIS — E1122 Type 2 diabetes mellitus with diabetic chronic kidney disease: Secondary | ICD-10-CM | POA: Diagnosis not present

## 2016-05-23 DIAGNOSIS — I10 Essential (primary) hypertension: Secondary | ICD-10-CM | POA: Diagnosis not present

## 2016-06-11 ENCOUNTER — Other Ambulatory Visit: Payer: Self-pay | Admitting: Nurse Practitioner

## 2016-06-11 NOTE — Telephone Encounter (Signed)
Can we refill this? 

## 2016-06-13 ENCOUNTER — Emergency Department: Payer: Medicare Other

## 2016-06-13 ENCOUNTER — Encounter: Payer: Self-pay | Admitting: Emergency Medicine

## 2016-06-13 ENCOUNTER — Inpatient Hospital Stay
Admission: EM | Admit: 2016-06-13 | Discharge: 2016-06-16 | DRG: 373 | Disposition: A | Discharge status: Home / Self Care | Payer: Medicare Other | Attending: Surgery | Admitting: Surgery

## 2016-06-13 DIAGNOSIS — K352 Acute appendicitis with generalized peritonitis: Secondary | ICD-10-CM | POA: Diagnosis not present

## 2016-06-13 DIAGNOSIS — E785 Hyperlipidemia, unspecified: Secondary | ICD-10-CM | POA: Diagnosis present

## 2016-06-13 DIAGNOSIS — Z7983 Long term (current) use of bisphosphonates: Secondary | ICD-10-CM

## 2016-06-13 DIAGNOSIS — K353 Acute appendicitis with localized peritonitis: Principal | ICD-10-CM | POA: Diagnosis present

## 2016-06-13 DIAGNOSIS — I129 Hypertensive chronic kidney disease with stage 1 through stage 4 chronic kidney disease, or unspecified chronic kidney disease: Secondary | ICD-10-CM | POA: Diagnosis present

## 2016-06-13 DIAGNOSIS — I10 Essential (primary) hypertension: Secondary | ICD-10-CM | POA: Diagnosis present

## 2016-06-13 DIAGNOSIS — T8149XA Infection following a procedure, other surgical site, initial encounter: Secondary | ICD-10-CM

## 2016-06-13 DIAGNOSIS — K3532 Acute appendicitis with perforation and localized peritonitis, without abscess: Secondary | ICD-10-CM

## 2016-06-13 DIAGNOSIS — E1122 Type 2 diabetes mellitus with diabetic chronic kidney disease: Secondary | ICD-10-CM | POA: Diagnosis present

## 2016-06-13 DIAGNOSIS — K5730 Diverticulosis of large intestine without perforation or abscess without bleeding: Secondary | ICD-10-CM | POA: Diagnosis present

## 2016-06-13 DIAGNOSIS — T8143XA Infection following a procedure, organ and space surgical site, initial encounter: Secondary | ICD-10-CM

## 2016-06-13 DIAGNOSIS — Z794 Long term (current) use of insulin: Secondary | ICD-10-CM

## 2016-06-13 DIAGNOSIS — Z79899 Other long term (current) drug therapy: Secondary | ICD-10-CM | POA: Diagnosis not present

## 2016-06-13 DIAGNOSIS — N183 Chronic kidney disease, stage 3 (moderate): Secondary | ICD-10-CM | POA: Diagnosis present

## 2016-06-13 DIAGNOSIS — K3533 Acute appendicitis with perforation and localized peritonitis, with abscess: Secondary | ICD-10-CM

## 2016-06-13 DIAGNOSIS — T814XXA Infection following a procedure, initial encounter: Secondary | ICD-10-CM | POA: Diagnosis not present

## 2016-06-13 DIAGNOSIS — Z800 Family history of malignant neoplasm of digestive organs: Secondary | ICD-10-CM | POA: Diagnosis not present

## 2016-06-13 DIAGNOSIS — K3580 Unspecified acute appendicitis: Secondary | ICD-10-CM | POA: Diagnosis not present

## 2016-06-13 DIAGNOSIS — Z8673 Personal history of transient ischemic attack (TIA), and cerebral infarction without residual deficits: Secondary | ICD-10-CM | POA: Diagnosis not present

## 2016-06-13 DIAGNOSIS — K651 Peritoneal abscess: Secondary | ICD-10-CM | POA: Diagnosis not present

## 2016-06-13 HISTORY — PX: APPENDECTOMY: SHX54

## 2016-06-13 LAB — CBC
HEMATOCRIT: 35.2 % (ref 35.0–47.0)
Hemoglobin: 12 g/dL (ref 12.0–16.0)
MCH: 33.4 pg (ref 26.0–34.0)
MCHC: 34.2 g/dL (ref 32.0–36.0)
MCV: 97.7 fL (ref 80.0–100.0)
PLATELETS: 346 10*3/uL (ref 150–440)
RBC: 3.6 MIL/uL — ABNORMAL LOW (ref 3.80–5.20)
RDW: 13.2 % (ref 11.5–14.5)
WBC: 11 10*3/uL (ref 3.6–11.0)

## 2016-06-13 LAB — COMPREHENSIVE METABOLIC PANEL
ALBUMIN: 3 g/dL — AB (ref 3.5–5.0)
ALK PHOS: 73 U/L (ref 38–126)
ALT: 32 U/L (ref 14–54)
ANION GAP: 7 (ref 5–15)
AST: 33 U/L (ref 15–41)
BUN: 30 mg/dL — ABNORMAL HIGH (ref 6–20)
CALCIUM: 9.3 mg/dL (ref 8.9–10.3)
CHLORIDE: 105 mmol/L (ref 101–111)
CO2: 23 mmol/L (ref 22–32)
Creatinine, Ser: 1.16 mg/dL — ABNORMAL HIGH (ref 0.44–1.00)
GFR calc Af Amer: 51 mL/min — ABNORMAL LOW (ref >60)
GFR calc non Af Amer: 44 mL/min — ABNORMAL LOW (ref >60)
GLUCOSE: 255 mg/dL — AB (ref 65–99)
Potassium: 4.1 mmol/L (ref 3.5–5.1)
SODIUM: 135 mmol/L (ref 135–145)
Total Bilirubin: 0.2 mg/dL — ABNORMAL LOW (ref 0.3–1.2)
Total Protein: 7 g/dL (ref 6.5–8.1)

## 2016-06-13 LAB — URINALYSIS COMPLETE WITH MICROSCOPIC (ARMC ONLY)
BILIRUBIN URINE: NEGATIVE
Bacteria, UA: NONE SEEN
GLUCOSE, UA: 150 mg/dL — AB
HGB URINE DIPSTICK: NEGATIVE
KETONES UR: NEGATIVE mg/dL
NITRITE: NEGATIVE
Protein, ur: NEGATIVE mg/dL
SPECIFIC GRAVITY, URINE: 1.012 (ref 1.005–1.030)
pH: 5 (ref 5.0–8.0)

## 2016-06-13 LAB — LIPASE, BLOOD: Lipase: 34 U/L (ref 11–51)

## 2016-06-13 LAB — GLUCOSE, CAPILLARY: Glucose-Capillary: 196 mg/dL — ABNORMAL HIGH (ref 65–99)

## 2016-06-13 MED ORDER — LACTATED RINGERS IV SOLN
INTRAVENOUS | Status: DC
  Administered 2016-06-13 – 2016-06-15 (×5): via INTRAVENOUS

## 2016-06-13 MED ORDER — PIPERACILLIN-TAZOBACTAM 3.375 G IVPB
3.3750 g | Freq: Three times a day (TID) | INTRAVENOUS | Status: DC
  Administered 2016-06-14 – 2016-06-15 (×5): 3.375 g via INTRAVENOUS
  Filled 2016-06-13 (×5): qty 50

## 2016-06-13 MED ORDER — ONDANSETRON HCL 4 MG/2ML IJ SOLN: 4.0000 mg | Freq: Four times a day (QID) | INTRAMUSCULAR | Status: DC | PRN

## 2016-06-13 MED ORDER — HYDROCODONE-ACETAMINOPHEN 5-325 MG PO TABS
1.0000 | ORAL_TABLET | ORAL | Status: DC | PRN
  Administered 2016-06-14: 1 via ORAL
  Filled 2016-06-13: qty 1

## 2016-06-13 MED ORDER — ONDANSETRON 4 MG PO TBDP
4.0000 mg | ORAL_TABLET | Freq: Four times a day (QID) | ORAL | Status: DC | PRN
Start: 2016-06-13 — End: 2016-06-16

## 2016-06-13 MED ORDER — PIPERACILLIN-TAZOBACTAM 3.375 G IVPB
3.3750 g | Freq: Once | INTRAVENOUS | Status: DC
  Filled 2016-06-13 (×2): qty 50

## 2016-06-13 MED ORDER — HYDRALAZINE HCL 20 MG/ML IJ SOLN: 10.0000 mg | INTRAMUSCULAR | Status: DC | PRN

## 2016-06-13 MED ORDER — DIPHENHYDRAMINE HCL 50 MG/ML IJ SOLN: 12.5000 mg | Freq: Four times a day (QID) | INTRAMUSCULAR | Status: DC | PRN

## 2016-06-13 MED ORDER — INSULIN ASPART 100 UNIT/ML ~~LOC~~ SOLN
0.0000 [IU] | Freq: Every day | SUBCUTANEOUS | Status: DC
  Administered 2016-06-14: 3 [IU] via SUBCUTANEOUS
  Administered 2016-06-15: 2 [IU] via SUBCUTANEOUS
  Filled 2016-06-13: qty 2
  Filled 2016-06-13: qty 3

## 2016-06-13 MED ORDER — INSULIN ASPART 100 UNIT/ML ~~LOC~~ SOLN
0.0000 [IU] | Freq: Three times a day (TID) | SUBCUTANEOUS | Status: DC
  Administered 2016-06-14: 3 [IU] via SUBCUTANEOUS
  Administered 2016-06-14 – 2016-06-15 (×4): 5 [IU] via SUBCUTANEOUS
  Administered 2016-06-15 – 2016-06-16 (×2): 3 [IU] via SUBCUTANEOUS
  Filled 2016-06-13 (×2): qty 3
  Filled 2016-06-13: qty 5
  Filled 2016-06-13: qty 3
  Filled 2016-06-13 (×3): qty 5

## 2016-06-13 MED ORDER — ENOXAPARIN SODIUM 40 MG/0.4ML ~~LOC~~ SOLN
40.0000 mg | SUBCUTANEOUS | Status: DC
  Administered 2016-06-14 – 2016-06-15 (×2): 40 mg via SUBCUTANEOUS
  Filled 2016-06-13 (×2): qty 0.4

## 2016-06-13 MED ORDER — PIPERACILLIN-TAZOBACTAM 3.375 G IVPB 30 MIN
3.3750 g | Freq: Once | INTRAVENOUS | Status: AC
  Administered 2016-06-13: 3.375 g via INTRAVENOUS

## 2016-06-13 MED ORDER — IOPAMIDOL (ISOVUE-300) INJECTION 61%
30.0000 mL | Freq: Once | INTRAVENOUS | Status: AC | PRN
  Administered 2016-06-13: 30 mL via ORAL
  Filled 2016-06-13: qty 30

## 2016-06-13 MED ORDER — SODIUM CHLORIDE 0.9 % IV BOLUS (SEPSIS)
1000.0000 mL | Freq: Once | INTRAVENOUS | Status: AC
  Administered 2016-06-13: 1000 mL via INTRAVENOUS

## 2016-06-13 MED ORDER — FAMOTIDINE IN NACL 20-0.9 MG/50ML-% IV SOLN
20.0000 mg | Freq: Every day | INTRAVENOUS | Status: DC
  Administered 2016-06-13 – 2016-06-15 (×3): 20 mg via INTRAVENOUS
  Filled 2016-06-13 (×4): qty 50

## 2016-06-13 MED ORDER — MORPHINE SULFATE (PF) 2 MG/ML IV SOLN
2.0000 mg | INTRAVENOUS | Status: DC | PRN
Start: 2016-06-13 — End: 2016-06-16
  Administered 2016-06-14 (×2): 2 mg via INTRAVENOUS
  Filled 2016-06-13 (×2): qty 1

## 2016-06-13 MED ORDER — DIPHENHYDRAMINE HCL 12.5 MG/5ML PO ELIX: 12.5000 mg | ORAL_SOLUTION | Freq: Four times a day (QID) | ORAL | Status: DC | PRN

## 2016-06-13 MED ORDER — IOPAMIDOL (ISOVUE-300) INJECTION 61%
75.0000 mL | Freq: Once | INTRAVENOUS | Status: AC | PRN
  Administered 2016-06-13: 75 mL via INTRAVENOUS
  Filled 2016-06-13: qty 75

## 2016-06-13 NOTE — ED Notes (Signed)
Pt c/o mid abdominal pain radiating to RLQ x 1 week, pt reports pain has been intermittent but became worse today, pt denies nausea, vomiting, diarrhea, or chest pain. Pt denies urinary symptoms, reports LBM was today. Pt alert and oriented x 4, no increased work in breathing, family at bedside, call bell within reach.

## 2016-06-13 NOTE — ED Triage Notes (Addendum)
Pt reports RLQ pain last week but improved over time. Pt reports pain returned today. Pt denies N/V/D. Pt describes as stabbing pain. Pt's last BM was today. Pt reports her blood sugar has been over 200 since the initial abdominal pain starter, normally it is below 150.

## 2016-06-13 NOTE — ED Notes (Signed)
Patient transported to CT via stretcher.

## 2016-06-13 NOTE — H&P (Signed)
Patient ID: Laurie Harris, female   DOB: 1941-07-28, 75 y.o.   MRN: [id number]  HPI Laurie Harris is a 75 y.o. female w 8 day hx of RLQ pain. Initially her pain was severe and sharp and but then subsided over the last 7 days. She reports that today started having again similar pain in the right right lower quadrant, worsening with movement. She did have some decreased appetite but no nausea and vomiting no fevers no chills. She actually has been eating Until lunchtime. Her history is significant for stroke last year at the base of the skull. Apparently they did endovascular intervention [facility]. She is only taking aspirin for antiplatelet therapy. She has reasonable cardiovascular performance and is able to perform more than 4 Mets of activity without any shortness of breath or chest pain. Her only sequela from her stroke was some balance issues . No previous abd surgeries Last colonoscopy less than that 2 years ago. He was normal. Please note that she did have a family history of colon cancer in her father  HPI  Past Medical History:  Diagnosis Date  . Carotid artery occlusion   . Chicken pox   . Diabetes mellitus without complication ([place])   . Dyspnea   . Hyperlipidemia   . Hypertension   . Renal insufficiency   . Stroke [facility]) 2016    Past Surgical History:  Procedure Laterality Date  . NECK SURGERY      Family History  Problem Relation Age of Onset  . Diabetes Mother   . Cancer Father     colon  . Breast cancer Neg Hx     Social History Social History  Substance Use Topics  . Smoking status: Never Smoker  . Smokeless tobacco: Never Used  . Alcohol use No    No Known Allergies  Current Facility-Administered Medications  Medication Dose Route Frequency Provider Last Rate Last Dose  . diphenhydrAMINE (BENADRYL) 12.5 MG/5ML elixir 12.5 mg  12.5 mg Oral Q6H PRN Avrum Kimball F Adalaya Irion, MD       Or  . diphenhydrAMINE (BENADRYL) injection 12.5 mg  12.5 mg Intravenous Q6H PRN Sarra Rachels F  Becci Batty, MD      . Derrill Memo ON 06/14/2016] enoxaparin (LOVENOX) injection 40 mg  40 mg Subcutaneous Q24H Dionel Archey F Yasin Ducat, MD      . famotidine (PEPCID) IVPB 20 mg premix  20 mg Intravenous Q12H Yardley Beltran F Aeralyn Barna, MD      . hydrALAZINE (APRESOLINE) injection 10 mg  10 mg Intravenous Q2H PRN Ellen Goris F Kron Everton, MD      . HYDROcodone-acetaminophen (NORCO/VICODIN) 5-325 MG per tablet 1-2 tablet  1-2 tablet Oral Q4H PRN Jamarco Zaldivar F Inetta Dicke, MD      . Derrill Memo ON 06/14/2016] insulin aspart (novoLOG) injection 0-15 Units  0-15 Units Subcutaneous TID WC Enda Santo F Vannary Greening, MD      . insulin aspart (novoLOG) injection 0-5 Units  0-5 Units Subcutaneous QHS Axl Rodino F Somara Frymire, MD      . lactated ringers infusion   Intravenous Continuous Skyelar Halliday F Lukis Bunt, MD      . morphine 2 MG/ML injection 2 mg  2 mg Intravenous Q2H PRN Shellie Goettl F Preet Perrier, MD      . ondansetron (ZOFRAN-ODT) disintegrating tablet 4 mg  4 mg Oral Q6H PRN Charma Mocarski F Aden Youngman, MD       Or  . ondansetron (ZOFRAN) injection 4 mg  4 mg Intravenous Q6H PRN Huy Majid Sarita Haver, MD      . piperacillin-tazobactam (  ZOSYN) IVPB 3.375 g  3.375 g Intravenous Once Orbie Pyo, MD      . piperacillin-tazobactam (ZOSYN) IVPB 3.375 g  3.375 g Intravenous Q8H Jules Husbands, MD       Current Outpatient Prescriptions  Medication Sig Dispense Refill  . alendronate (FOSAMAX) 70 MG tablet TAKE 1 TABLET ONCE A WEEK WITH A FULL GLASS OF WATER 4 tablet 2  . aspirin 81 MG tablet Take 81 mg by mouth daily.    Marland Kitchen atorvastatin (LIPITOR) 80 MG tablet TAKE ONE TABLET EVERY DAY 90 tablet 3  . benazepril (LOTENSIN) 5 MG tablet Take 1 tablet (5 mg total) by mouth daily. 180 tablet 1  . calcium elemental as carbonate (PX ANTACID MAXIMUM STRENGTH) 400 MG chewable tablet Chew by mouth.    . fenofibrate 160 MG tablet TAKE ONE TABLET EVERY DAY 90 tablet 1  . HUMALOG MIX 75/25 KWIKPEN (75-25) 100 UNIT/ML Kwikpen INJECT 15 UNITS TWICE DAILY AS DIRECTED 15 mL 2  . NOVOFINE PLUS 32G X 4 MM MISC USE TWICE DAILY AS DIRECTED  100 each 6  . pantoprazole (PROTONIX) 40 MG tablet Take 40 mg by mouth daily.       Review of Systems A 10 point review of systems was asked and was negative except for the information on the HPI  Physical Exam Blood pressure (!) 169/60, pulse 84, temperature 99.6 F (37.6 C), temperature source Oral, resp. rate 16, height 5\' 6"  (1.676 m), weight 58.1 kg (128 lb), SpO2 97 %. CONSTITUTIONAL: NAD, non toxic EYES: Pupils are equal, round, and reactive to light, Sclera are non-icteric. EARS, NOSE, MOUTH AND THROAT: The oropharynx is clear. The oral mucosa is pink and moist. Hearing is intact to voice. LYMPH NODES:  Lymph nodes in the neck are normal. RESPIRATORY:  Lungs are clear. There is normal respiratory effort, with equal breath sounds bilaterally, and without pathologic use of accessory muscles. CARDIOVASCULAR: Heart is regular without murmurs, gallops, or rubs. GI: The abdomen is soft, TTP RLQ, no peritonitis.  GU: Rectal deferred.   MUSCULOSKELETAL: Normal muscle strength and tone. No cyanosis or edema.   SKIN: Turgor is good and there are no pathologic skin lesions or ulcers. NEUROLOGIC: Motor and sensation is grossly normal. Cranial nerves are grossly intact. PSYCH:  Oriented to person, place and time. Affect is normal.  Data Reviewed  I have personally reviewed the patient's imaging, laboratory findings and medical records.    Assessment Plan 75 year old female with appendiceal abscess with contained perforation. She is not toxic and does not need any emergent surgical intervention. We will plan to admit her and place her nothing by mouth, resuscitated her with IV fluids, start IV antibiotics and arrange for interventional radiology to put a drain in the abscess. I do think that there is a good window that they can potentially drain this cavity. Discussed with the patient in detail with her husband about her disease process. And there is always a small chance that she may require  surgical intervention if the medical therapy fails. Discussed with her in detail. I also discussed with her that in ideal circumstances we will do an interval appendectomy in a couple months after her acute episode has subsided. Extensive counseling provided.  Caroleen Hamman, MD FACS General Surgeon 06/13/2016, 9:57 PM

## 2016-06-13 NOTE — Telephone Encounter (Signed)
Refill sent to pharmacy. Patient needs a repeat DEXA scan and we'll discuss this at her next visit. Thanks.

## 2016-06-13 NOTE — ED Notes (Signed)
Pt up to bathroom with assistance to attempt to provide urine specimen.

## 2016-06-13 NOTE — ED Notes (Signed)
PT FINISHED DRINKING CONTRAST. CT NOTIFIED

## 2016-06-13 NOTE — ED Provider Notes (Signed)
[facility] Emergency Department Provider Note   ____________________________________________   First MD Initiated Contact with Patient 06/13/16 1853     (approximate)  I have reviewed the triage vital signs and the nursing notes.   HISTORY  Chief Complaint Abdominal Pain   HPI DONAE SHILEY is a 75 y.o. female with a history of diabetes as well as hypertension who is presenting to the emergency department with intermittent right lower quadrant abdominal pain. She said that she had the pain this past week and then resolved but then returned today. She says that there is no pain when she is still but that movement exacerbates the right lower quadrant pain. She denies any dysuria. Denies any nausea vomiting or diarrhea. Says that she is having normal bowel movements without any constipation.   Past Medical History:  Diagnosis Date  . Carotid artery occlusion   . Chicken pox   . Diabetes mellitus without complication ([place])   . Dyspnea   . Hyperlipidemia   . Hypertension   . Stroke [facility]) 2016    Patient Active Problem List   Diagnosis Date Noted  . CKD (chronic kidney disease) stage 3, GFR 30-59 ml/min 05/10/2016  . Screening for breast cancer 02/06/2016  . Diabetes mellitus type 2, controlled, with complications ([place]) [id number]  . Benign essential HTN 03/16/2015  . Hyperlipidemia 03/16/2015  . Cerebellar hemorrhage ([place]) 03/16/2015  . Carotid artery occlusion without infarction 08/18/2013    Past Surgical History:  Procedure Laterality Date  . NECK SURGERY      Prior to Admission medications   Medication Sig Start Date End Date Taking? Authorizing Provider  alendronate (FOSAMAX) 70 MG tablet TAKE 1 TABLET ONCE A WEEK WITH A FULL GLASS OF WATER 06/13/16   [name], MD  aspirin 81 MG tablet Take 81 mg by mouth daily.    Historical Provider, MD  atorvastatin (LIPITOR) 80 MG tablet TAKE ONE TABLET EVERY DAY 12/16/15   Rubbie Battiest,  NP  benazepril (LOTENSIN) 5 MG tablet Take 1 tablet (5 mg total) by mouth daily. 05/09/16   [name], MD  calcium elemental as carbonate (PX ANTACID MAXIMUM STRENGTH) 400 MG chewable tablet Chew by mouth.    Historical Provider, MD  fenofibrate 160 MG tablet TAKE ONE TABLET EVERY DAY 03/12/16   Jackolyn Confer, MD  HUMALOG MIX 75/25 KWIKPEN (75-25) 100 UNIT/ML Kwikpen INJECT 15 UNITS TWICE DAILY AS DIRECTED 03/15/16   Jackolyn Confer, MD  NOVOFINE PLUS 32G X 4 MM MISC USE TWICE DAILY AS DIRECTED 03/15/16   Jackolyn Confer, MD  pantoprazole (PROTONIX) 40 MG tablet Take 40 mg by mouth daily.    Historical Provider, MD    Allergies Review of patient's allergies indicates no known allergies.  Family History  Problem Relation Age of Onset  . Diabetes Mother   . Cancer Father     colon  . Breast cancer Neg Hx     Social History Social History  Substance Use Topics  . Smoking status: Never Smoker  . Smokeless tobacco: Never Used  . Alcohol use No    Review of Systems Constitutional: No fever/chills Eyes: No visual changes. ENT: No sore throat. Cardiovascular: Denies chest pain. Respiratory: Denies shortness of breath. Gastrointestinal: No nausea, no vomiting.  No diarrhea.  No constipation. Genitourinary: Negative for dysuria. Musculoskeletal: Negative for back pain. Skin: Negative for rash. Neurological: Negative for headaches, focal weakness or numbness.  10-point ROS otherwise negative.  ____________________________________________   PHYSICAL EXAM:  VITAL SIGNS: ED Triage Vitals  Enc Vitals Group     BP 06/13/16 1717 (!) 164/59     Pulse Rate 06/13/16 1717 98     Resp 06/13/16 1717 16     Temp 06/13/16 1717 99.6 F (37.6 C)     Temp Source 06/13/16 1717 Oral     SpO2 06/13/16 1717 97 %     Weight 06/13/16 1718 128 lb (58.1 kg)     Height 06/13/16 1718 5\' 6"  (1.676 m)     Head Circumference --      Peak Flow --      Pain Score 06/13/16 1719 3      Pain Loc --      Pain Edu? --      Excl. in [place]? --     Constitutional: Alert and oriented. Well appearing and in no acute distress. Eyes: Conjunctivae are normal. PERRL. EOMI. Head: Atraumatic. Nose: No congestion/rhinnorhea. Mouth/Throat: Mucous membranes are moist.   Neck: No stridor.   Cardiovascular: Normal rate, regular rhythm. Grossly normal heart sounds.  Good peripheral circulation. Respiratory: Normal respiratory effort.  No retractions. Lungs CTAB. Gastrointestinal: Soft with moderate right lower quadrant tenderness to palpation. No distention.  No CVA tenderness. Musculoskeletal: No lower extremity tenderness nor edema.  No joint effusions. Neurologic:  Normal speech and language. No gross focal neurologic deficits are appreciated. No gait instability. Skin:  Skin is warm, dry and intact. No rash noted. Psychiatric: Mood and affect are normal. Speech and behavior are normal.  ____________________________________________   LABS (all labs ordered are listed, but only abnormal results are displayed)  Labs Reviewed  COMPREHENSIVE METABOLIC PANEL - Abnormal; Notable for the following:       Result Value   Glucose, Bld 255 (*)    BUN 30 (*)    Creatinine, Ser 1.16 (*)    Albumin 3.0 (*)    Total Bilirubin 0.2 (*)    GFR calc non Af Amer 44 (*)    GFR calc Af Amer 51 (*)    All other components within normal limits  CBC - Abnormal; Notable for the following:    RBC 3.60 (*)    All other components within normal limits  LIPASE, BLOOD  URINALYSIS COMPLETEWITH MICROSCOPIC ([facility] ONLY)   ____________________________________________  EKG   ____________________________________________  RADIOLOGY CT Abdomen Pelvis W Contrast (Final result)  Result time 06/13/16 21:24:11  Final result by Santa Lighter, MD (06/13/16 21:24:11)           Narrative:   CLINICAL DATA: Acute onset of right lower quadrant abdominal pain. Hyperglycemia. Initial encounter.  EXAM: CT  ABDOMEN AND PELVIS WITH CONTRAST  TECHNIQUE: Multidetector CT imaging of the abdomen and pelvis was performed using the standard protocol following bolus administration of intravenous contrast.  CONTRAST: 26mL ISOVUE-300 IOPAMIDOL (ISOVUE-300) INJECTION 61%  COMPARISON: None.  FINDINGS: Lower chest: Scattered coronary artery calcification is noted. Mild calcification is noted at the aortic valve. Minimal bibasilar atelectasis is seen.  Hepatobiliary: The liver is unremarkable in appearance. Stones are noted dependently within the gallbladder. The gallbladder is otherwise unremarkable. The common bile duct remains normal in caliber.  Pancreas: The pancreas is within normal limits.  Spleen: The spleen is unremarkable in appearance.  Adrenals/Urinary Tract: The adrenal glands are unremarkable in appearance. Mild bilateral renal atrophy is noted, with mild bilateral perinephric stranding. There is no evidence of hydronephrosis. No renal or ureteral stones are identified.  Stomach/Bowel: There is  a focal 4.9 x 4.8 x 4.2 cm abscess noted at the right iliac fossa, overlying the right iliopsoas musculature, with scattered fluid and air and containing several appendicoliths. Mild overlying soft tissue inflammation is noted. Findings are compatible with perforated appendicitis. This is contiguous with the distal appendix.  Surrounding bowel loops are unremarkable. The proximal appendix is only mildly distended at this time, measuring up to 9 mm. Diffuse diverticulosis is noted along the entirety of the colon, without evidence of diverticulitis.  The stomach is partially filled with contrast and is unremarkable in appearance. No small bowel abnormalities are seen.  Vascular/Lymphatic: Diffuse calcification is seen along the abdominal aorta and its branches. There is mild luminal narrowing at the infrarenal abdominal aorta due to calcification. The abdominal aorta is otherwise  grossly unremarkable. The inferior vena cava is grossly unremarkable. No retroperitoneal lymphadenopathy is seen. No pelvic sidewall lymphadenopathy is identified.  Reproductive: The bladder is mildly distended and within normal limits. The uterus is grossly unremarkable in appearance. The ovaries are relatively symmetric. No suspicious adnexal masses are seen.  Other: No additional soft tissue abnormalities are seen.  Musculoskeletal: No acute osseous abnormalities are identified. Multilevel vacuum phenomenon is noted along the lumbar spine, with endplate sclerotic change and mild right convex lumbar scoliosis. The visualized musculature is unremarkable in appearance.  IMPRESSION: 1. Focal 4.9 x 4.8 x 4.2 cm abscess at the right iliac fossa, overlying the right iliopsoas musculature, with scattered fluid and air, containing several appendicoliths. Mild overlying soft tissue inflammation noted. Findings compatible with perforated appendicitis. The abscess is contiguous with the distal appendix. 2. Proximal appendix is only mildly distended at this time, measuring up to 9 mm. Surrounding bowel loops are unremarkable. 3. Diffuse diverticulosis along the entirety of the colon, without evidence of diverticulitis. 4. Cholelithiasis; gallbladder otherwise unremarkable. 5. Scattered coronary artery calcification noted. Mild calcification at the aortic valve. 6. Diffuse aortic atherosclerosis noted. Mild luminal narrowing noted at the infrarenal abdominal aorta due to calcification. 7. Degenerative change along the lumbar spine, with mild right convex lumbar scoliosis and underlying sclerosis. These results were called by telephone at the time of interpretation on 06/13/2016 at 9:23 pm to Dr. Larae Grooms, who verbally acknowledged these results.   Electronically Signed By: Garald Balding M.D. On: 06/13/2016 21:24              ____________________________________________   PROCEDURES  Procedure(s) performed:   Procedures  Critical Care performed:   ____________________________________________   INITIAL IMPRESSION / ASSESSMENT AND PLAN / ED COURSE  Pertinent labs & imaging results that were available during my care of the patient were reviewed by me and considered in my medical decision making (see chart for details).  ----------------------------------------- 9:29 PM on 06/13/2016 -----------------------------------------  Patient updated about the radiology findings at this time. She is aware of the need for admission to the hospital. I also spoke to the surgeon, Dr. Dahlia Byes, who agrees to evaluate and admit the patient.  Clinical Course     ____________________________________________   FINAL CLINICAL IMPRESSION(S) / ED DIAGNOSES  Perforated appendicitis with abscess.    NEW MEDICATIONS STARTED DURING THIS VISIT:  New Prescriptions   No medications on file     Note:  This document was prepared using Dragon voice recognition software and may include unintentional dictation errors.    Orbie Pyo, MD 06/13/16 2130

## 2016-06-14 ENCOUNTER — Inpatient Hospital Stay: Payer: Medicare Other

## 2016-06-14 HISTORY — PX: ABCESS DRAINAGE: SHX399

## 2016-06-14 LAB — CBC
HCT: 30.9 % — ABNORMAL LOW (ref 35.0–47.0)
Hemoglobin: 10.6 g/dL — ABNORMAL LOW (ref 12.0–16.0)
MCH: 33.7 pg (ref 26.0–34.0)
MCHC: 34.4 g/dL (ref 32.0–36.0)
MCV: 98.2 fL (ref 80.0–100.0)
PLATELETS: 298 10*3/uL (ref 150–440)
RBC: 3.14 MIL/uL — ABNORMAL LOW (ref 3.80–5.20)
RDW: 13.1 % (ref 11.5–14.5)
WBC: 8.8 10*3/uL (ref 3.6–11.0)

## 2016-06-14 LAB — BASIC METABOLIC PANEL
Anion gap: 5 (ref 5–15)
BUN: 23 mg/dL — ABNORMAL HIGH (ref 6–20)
CALCIUM: 8.4 mg/dL — AB (ref 8.9–10.3)
CO2: 26 mmol/L (ref 22–32)
CREATININE: 1.02 mg/dL — AB (ref 0.44–1.00)
Chloride: 108 mmol/L (ref 101–111)
GFR calc non Af Amer: 52 mL/min — ABNORMAL LOW (ref >60)
GFR, EST AFRICAN AMERICAN: 60 mL/min — AB (ref >60)
Glucose, Bld: 192 mg/dL — ABNORMAL HIGH (ref 65–99)
Potassium: 4.1 mmol/L (ref 3.5–5.1)
SODIUM: 139 mmol/L (ref 135–145)

## 2016-06-14 LAB — GLUCOSE, CAPILLARY
GLUCOSE-CAPILLARY: 221 mg/dL — AB (ref 65–99)
GLUCOSE-CAPILLARY: 229 mg/dL — AB (ref 65–99)
Glucose-Capillary: 185 mg/dL — ABNORMAL HIGH (ref 65–99)
Glucose-Capillary: 257 mg/dL — ABNORMAL HIGH (ref 65–99)

## 2016-06-14 LAB — PROTIME-INR
INR: 1.25
PROTHROMBIN TIME: 15.8 s — AB (ref 11.4–15.2)

## 2016-06-14 LAB — APTT: APTT: 38 s — AB (ref 24–36)

## 2016-06-14 MED ORDER — SODIUM CHLORIDE 0.9% FLUSH
5.0000 mL | Freq: Three times a day (TID) | INTRAVENOUS | Status: DC
  Administered 2016-06-14 – 2016-06-16 (×5): 5 mL

## 2016-06-14 MED ORDER — MIDAZOLAM HCL 5 MG/5ML IJ SOLN
INTRAMUSCULAR | Status: AC
  Filled 2016-06-14: qty 5

## 2016-06-14 MED ORDER — SODIUM CHLORIDE 0.9% FLUSH
5.0000 mL | Freq: Three times a day (TID) | INTRAVENOUS | Status: DC
  Administered 2016-06-14: 5 mL via INTRAVENOUS

## 2016-06-14 MED ORDER — FENTANYL CITRATE (PF) 100 MCG/2ML IJ SOLN
INTRAMUSCULAR | Status: AC
  Filled 2016-06-14: qty 4

## 2016-06-14 MED ORDER — MIDAZOLAM HCL 2 MG/2ML IJ SOLN
INTRAMUSCULAR | Status: AC | PRN
  Administered 2016-06-14: 1 mg via INTRAVENOUS

## 2016-06-14 MED ORDER — FENTANYL CITRATE (PF) 100 MCG/2ML IJ SOLN
INTRAMUSCULAR | Status: AC | PRN
  Administered 2016-06-14: 50 ug via INTRAVENOUS

## 2016-06-14 NOTE — Progress Notes (Signed)
Pharmacy Antibiotic Note  Laurie Harris is a 75 y.o. female admitted on 06/13/2016 with intra abdominal infection.  Pharmacy has been consulted for piperacillin/tazobactam dosing.  Plan: Piperacillin/tazobactam 3.375 g IV q8h EI  Height: 5\' 6"  (167.6 cm) Weight: 128 lb (58.1 kg) IBW/kg (Calculated) : 59.3  Temp (24hrs), Avg:99 F (37.2 C), Min:98.5 F (36.9 C), Max:99.6 F (37.6 C)   Recent Labs Lab 06/13/16 1720 06/14/16 0416  WBC 11.0 8.8  CREATININE 1.16* 1.02*    Estimated Creatinine Clearance: 42.4 mL/min (by C-G formula based on SCr of 1.02 mg/dL (H)).    No Known Allergies  Antimicrobials this admission: Piperacillin/tazobactam 10/12 >>   Dose adjustments this admission:  Microbiology results: 10/11 MRSA PCR: Sent  Thank you for allowing pharmacy to be a part of this patient's care.  Lenis Noon, PharmD, BCPS Clinical Pharmacist 06/14/2016 10:05 AM

## 2016-06-14 NOTE — Consult Note (Signed)
Chief Complaint: Ruptured appendicitis  Referring Physician(s): Pabon  Patient Status: [facility] - In-pt  History of Present Illness: Laurie Harris is a 75 y.o. female with past medical history significant for carotid artery occlusion, diabetes, hyperlipidemia, hypertension, stroke and renal insufficiency who presented to the [facility] ED on 06/13/2016 with complaint of worsening right lower quadrant abdominal pain for the past week. CT scan of the abdomen and pelvis demonstrated an approximately 4.9 cm periappendiceal abscess. As such, request made for percutaneous drainage catheter placement.  Besides right lower quadrant abdominal pain, the patient is without complaint. No fever or chills. No chest pain or shortness of breath.  Past Medical History:  Diagnosis Date  . Carotid artery occlusion   . Chicken pox   . Diabetes mellitus without complication ([place])   . Dyspnea   . Hyperlipidemia   . Hypertension   . Renal insufficiency   . Stroke [facility]) 2016    Past Surgical History:  Procedure Laterality Date  . NECK SURGERY      Allergies: Review of patient's allergies indicates no known allergies.  Medications: Prior to Admission medications   Medication Sig Start Date End Date Taking? Authorizing Provider  alendronate (FOSAMAX) 70 MG tablet TAKE 1 TABLET ONCE A WEEK WITH A FULL GLASS OF WATER 06/13/16  Yes [name], MD  aspirin EC 81 MG tablet Take 81 mg by mouth daily.   Yes Historical Provider, MD  atorvastatin (LIPITOR) 80 MG tablet TAKE ONE TABLET EVERY DAY 12/16/15  Yes Rubbie Battiest, NP  benazepril (LOTENSIN) 5 MG tablet Take 1 tablet (5 mg total) by mouth daily. Patient taking differently: Take 10 mg by mouth daily.  05/09/16  Yes [name], MD  calcium-vitamin D (OSCAL WITH D) 500-200 MG-UNIT tablet Take 1 tablet by mouth 2 (two) times daily.   Yes Historical Provider, MD  cholecalciferol (VITAMIN D) 1000 units tablet Take 2,000  Units by mouth daily.   Yes Historical Provider, MD  fenofibrate 160 MG tablet Take 160 mg by mouth at bedtime.   Yes Historical Provider, MD  HUMALOG MIX 75/25 KWIKPEN (75-25) 100 UNIT/ML Kwikpen INJECT 15 UNITS TWICE DAILY AS DIRECTED Patient taking differently: INJECT 17 UNITS TWICE DAILY AS DIRECTED 03/15/16  Yes Jackolyn Confer, MD  Multiple Vitamin (MULTIVITAMIN WITH MINERALS) TABS tablet Take 1 tablet by mouth daily.   Yes Historical Provider, MD  Omega-3 Fatty Acids (FISH OIL) 1200 MG CAPS Take 1,200 mg by mouth daily.   Yes Historical Provider, MD  pantoprazole (PROTONIX) 40 MG tablet Take 40 mg by mouth daily.   Yes Historical Provider, MD  NOVOFINE PLUS 32G X 4 MM MISC USE TWICE DAILY AS DIRECTED 03/15/16   Jackolyn Confer, MD     Family History  Problem Relation Age of Onset  . Diabetes Mother   . Cancer Father     colon  . Breast cancer Neg Hx     Social History   Social History  . Marital status: Married    Spouse name: N/A  . Number of children: N/A  . Years of education: N/A   Social History Main Topics  . Smoking status: Never Smoker  . Smokeless tobacco: Never Used  . Alcohol use No  . Drug use: No  . Sexual activity: Not Asked   Other Topics Concern  . None   Social History Narrative  . None    ECOG Status: 1 - Symptomatic but completely  ambulatory  Review of Systems: A 12 point ROS discussed and pertinent positives are indicated in the HPI above.  All other systems are negative.  Review of Systems  Constitutional: Negative for chills and fever.  Respiratory: Negative.   Cardiovascular: Negative.   Gastrointestinal: Positive for abdominal pain.    Vital Signs: BP (!) 147/71 (BP Location: Right Arm)   Pulse 82   Temp 98.7 F (37.1 C) (Oral)   Resp (!) 23   Ht 5\' 6"  (1.676 m)   Wt 128 lb (58.1 kg)   SpO2 93%   BMI 20.66 kg/m   Physical Exam  Constitutional: She appears well-developed and well-nourished.  HENT:  Head: Normocephalic  and atraumatic.  Abdominal:  Patient reports pain with palpation of the right lower abdominal quadrant.  Nursing note and vitals reviewed.   Imaging: Ct Abdomen Pelvis W Contrast  Result Date: 06/13/2016 CLINICAL DATA:  Acute onset of right lower quadrant abdominal pain. Hyperglycemia. Initial encounter. EXAM: CT ABDOMEN AND PELVIS WITH CONTRAST TECHNIQUE: Multidetector CT imaging of the abdomen and pelvis was performed using the standard protocol following bolus administration of intravenous contrast. CONTRAST:  41mL ISOVUE-300 IOPAMIDOL (ISOVUE-300) INJECTION 61% COMPARISON:  None. FINDINGS: Lower chest: Scattered coronary artery calcification is noted. Mild calcification is noted at the aortic valve. Minimal bibasilar atelectasis is seen. Hepatobiliary: The liver is unremarkable in appearance. Stones are noted dependently within the gallbladder. The gallbladder is otherwise unremarkable. The common bile duct remains normal in caliber. Pancreas: The pancreas is within normal limits. Spleen: The spleen is unremarkable in appearance. Adrenals/Urinary Tract: The adrenal glands are unremarkable in appearance. Mild bilateral renal atrophy is noted, with mild bilateral perinephric stranding. There is no evidence of hydronephrosis. No renal or ureteral stones are identified. Stomach/Bowel: There is a focal 4.9 x 4.8 x 4.2 cm abscess noted at the right iliac fossa, overlying the right iliopsoas musculature, with scattered fluid and air and containing several appendicoliths. Mild overlying soft tissue inflammation is noted. Findings are compatible with perforated appendicitis. This is contiguous with the distal appendix. Surrounding bowel loops are unremarkable. The proximal appendix is only mildly distended at this time, measuring up to 9 mm. Diffuse diverticulosis is noted along the entirety of the colon, without evidence of diverticulitis. The stomach is partially filled with contrast and is unremarkable in  appearance. No small bowel abnormalities are seen. Vascular/Lymphatic: Diffuse calcification is seen along the abdominal aorta and its branches. There is mild luminal narrowing at the infrarenal abdominal aorta due to calcification. The abdominal aorta is otherwise grossly unremarkable. The inferior vena cava is grossly unremarkable. No retroperitoneal lymphadenopathy is seen. No pelvic sidewall lymphadenopathy is identified. Reproductive: The bladder is mildly distended and within normal limits. The uterus is grossly unremarkable in appearance. The ovaries are relatively symmetric. No suspicious adnexal masses are seen. Other: No additional soft tissue abnormalities are seen. Musculoskeletal: No acute osseous abnormalities are identified. Multilevel vacuum phenomenon is noted along the lumbar spine, with endplate sclerotic change and mild right convex lumbar scoliosis. The visualized musculature is unremarkable in appearance. IMPRESSION: 1. Focal 4.9 x 4.8 x 4.2 cm abscess at the right iliac fossa, overlying the right iliopsoas musculature, with scattered fluid and air, containing several appendicoliths. Mild overlying soft tissue inflammation noted. Findings compatible with perforated appendicitis. The abscess is contiguous with the distal appendix. 2. Proximal appendix is only mildly distended at this time, measuring up to 9 mm. Surrounding bowel loops are unremarkable. 3. Diffuse diverticulosis along the entirety of  the colon, without evidence of diverticulitis. 4. Cholelithiasis; gallbladder otherwise unremarkable. 5. Scattered coronary artery calcification noted. Mild calcification at the aortic valve. 6. Diffuse aortic atherosclerosis noted. Mild luminal narrowing noted at the infrarenal abdominal aorta due to calcification. 7. Degenerative change along the lumbar spine, with mild right convex lumbar scoliosis and underlying sclerosis. These results were called by telephone at the time of interpretation on  06/13/2016 at 9:23 pm to Dr. Larae Grooms, who verbally acknowledged these results. Electronically Signed   By: Garald Balding M.D.   On: 06/13/2016 21:24    Labs:  CBC:  Recent Labs  06/13/16 1720 06/14/16 0416  WBC 11.0 8.8  HGB 12.0 10.6*  HCT 35.2 30.9*  PLT 346 298    COAGS:  Recent Labs  06/14/16 0416  INR 1.25  APTT 38*    BMP:  Recent Labs  05/10/16 0820 06/13/16 1720 06/14/16 0416  NA 141 135 139  K 4.7 4.1 4.1  CL 107 105 108  CO2 29 23 26   GLUCOSE 185* 255* 192*  BUN 33* 30* 23*  CALCIUM 10.0 9.3 8.4*  CREATININE 1.36* 1.16* 1.02*  GFRNONAA  --  44* 52*  GFRAA  --  51* 60*    LIVER FUNCTION TESTS:  Recent Labs  05/10/16 0820 06/13/16 1720  BILITOT 0.5 0.2*  AST 25 33  ALT 25 32  ALKPHOS 52 73  PROT 7.1 7.0  ALBUMIN 4.3 3.0*    TUMOR MARKERS: No results for input(s): AFPTM, CEA, CA199, CHROMGRNA in the last 8760 hours.  Assessment and Plan:  Laurie Harris is a 75 y.o. female with past medical history significant for carotid artery occlusion, diabetes, hyperlipidemia, hypertension, stroke and renal insufficiency who presented to the [facility] ED on 06/13/2016 with complaint of worsening right lower quadrant abdominal pain for the past week. CT scan of the abdomen and pelvis demonstrated an approximately 4.9 cm periappendiceal abscess. As such, request made for percutaneous drainage catheter placement.  Risks and Benefits of CT-guided percutaneous drainage catheter placement were discussed with the patient including bleeding, infection, damage to adjacent structures, bowel perforation/fistula connection, and sepsis.  All of the patient's questions were answered, patient is agreeable to proceed.  Consent signed and in chart.  Thank you for this interesting consult.  I greatly enjoyed meeting Laurie Harris and look forward to participating in their care.  A copy of this report was sent to the requesting  provider on this date.  Electronically Signed: Sandi Mariscal 06/14/2016, 9:25 AM   I spent a total of 20 Minutes in face to face in clinical consultation, greater than 50% of which was counseling/coordinating care for ruptured appendicitis

## 2016-06-14 NOTE — Care Management Important Message (Signed)
Important Message  Patient Details  Name: Laurie Harris MRN: [id number] Date of Birth: 1941-04-01   Medicare Important Message Given:  Yes    Shelbie Ammons, RN 06/14/2016, 11:03 AM

## 2016-06-14 NOTE — Progress Notes (Signed)
CC: Ruptured appendicitis Subjective: Patient reports feeling okay with the exception of right lower quadrant tenderness. 2 IR today for drainage of her periappendiceal abscess.  Objective: Vital signs in last 24 hours: Temp:  [98.2 F (36.8 C)-99.6 F (37.6 C)] 98.2 F (36.8 C) (10/12 1240) Pulse Rate:  [76-98] 90 (10/12 1240) Resp:  [16-23] 18 (10/12 1240) BP: (119-169)/(54-98) 162/60 (10/12 1240) SpO2:  [91 %-98 %] 94 % (10/12 1240) Weight:  [58.1 kg (128 lb)] 58.1 kg (128 lb) (10/11 1718) Last BM Date: 06/13/16  Intake/Output from previous day: 10/11 0701 - 10/12 0700 In: 1717 [I.V.:617; IV Piggyback:1100] Out: 450 [Urine:450] Intake/Output this shift: Total I/O In: 1195 [I.V.:1195] Out: 900 [Urine:900]  Physical exam:  Gen.: No acute distress Chest: Clear to auscultation Heart: Regular rhythm Abdomen: Soft, tender to palpation in the right lower quadrant, nondistended.  Lab Results: CBC   Recent Labs  06/13/16 1720 06/14/16 0416  WBC 11.0 8.8  HGB 12.0 10.6*  HCT 35.2 30.9*  PLT 346 298   BMET  Recent Labs  06/13/16 1720 06/14/16 0416  NA 135 139  K 4.1 4.1  CL 105 108  CO2 23 26  GLUCOSE 255* 192*  BUN 30* 23*  CREATININE 1.16* 1.02*  CALCIUM 9.3 8.4*   PT/INR  Recent Labs  06/14/16 0416  LABPROT 15.8*  INR 1.25   ABG No results for input(s): PHART, HCO3 in the last 72 hours.  Invalid input(s): PCO2, PO2  Studies/Results: Ct Abdomen Pelvis W Contrast  Result Date: 06/13/2016 CLINICAL DATA:  Acute onset of right lower quadrant abdominal pain. Hyperglycemia. Initial encounter. EXAM: CT ABDOMEN AND PELVIS WITH CONTRAST TECHNIQUE: Multidetector CT imaging of the abdomen and pelvis was performed using the standard protocol following bolus administration of intravenous contrast. CONTRAST:  9mL ISOVUE-300 IOPAMIDOL (ISOVUE-300) INJECTION 61% COMPARISON:  None. FINDINGS: Lower chest: Scattered coronary artery calcification is noted. Mild  calcification is noted at the aortic valve. Minimal bibasilar atelectasis is seen. Hepatobiliary: The liver is unremarkable in appearance. Stones are noted dependently within the gallbladder. The gallbladder is otherwise unremarkable. The common bile duct remains normal in caliber. Pancreas: The pancreas is within normal limits. Spleen: The spleen is unremarkable in appearance. Adrenals/Urinary Tract: The adrenal glands are unremarkable in appearance. Mild bilateral renal atrophy is noted, with mild bilateral perinephric stranding. There is no evidence of hydronephrosis. No renal or ureteral stones are identified. Stomach/Bowel: There is a focal 4.9 x 4.8 x 4.2 cm abscess noted at the right iliac fossa, overlying the right iliopsoas musculature, with scattered fluid and air and containing several appendicoliths. Mild overlying soft tissue inflammation is noted. Findings are compatible with perforated appendicitis. This is contiguous with the distal appendix. Surrounding bowel loops are unremarkable. The proximal appendix is only mildly distended at this time, measuring up to 9 mm. Diffuse diverticulosis is noted along the entirety of the colon, without evidence of diverticulitis. The stomach is partially filled with contrast and is unremarkable in appearance. No small bowel abnormalities are seen. Vascular/Lymphatic: Diffuse calcification is seen along the abdominal aorta and its branches. There is mild luminal narrowing at the infrarenal abdominal aorta due to calcification. The abdominal aorta is otherwise grossly unremarkable. The inferior vena cava is grossly unremarkable. No retroperitoneal lymphadenopathy is seen. No pelvic sidewall lymphadenopathy is identified. Reproductive: The bladder is mildly distended and within normal limits. The uterus is grossly unremarkable in appearance. The ovaries are relatively symmetric. No suspicious adnexal masses are seen. Other: No additional soft tissue  abnormalities are  seen. Musculoskeletal: No acute osseous abnormalities are identified. Multilevel vacuum phenomenon is noted along the lumbar spine, with endplate sclerotic change and mild right convex lumbar scoliosis. The visualized musculature is unremarkable in appearance. IMPRESSION: 1. Focal 4.9 x 4.8 x 4.2 cm abscess at the right iliac fossa, overlying the right iliopsoas musculature, with scattered fluid and air, containing several appendicoliths. Mild overlying soft tissue inflammation noted. Findings compatible with perforated appendicitis. The abscess is contiguous with the distal appendix. 2. Proximal appendix is only mildly distended at this time, measuring up to 9 mm. Surrounding bowel loops are unremarkable. 3. Diffuse diverticulosis along the entirety of the colon, without evidence of diverticulitis. 4. Cholelithiasis; gallbladder otherwise unremarkable. 5. Scattered coronary artery calcification noted. Mild calcification at the aortic valve. 6. Diffuse aortic atherosclerosis noted. Mild luminal narrowing noted at the infrarenal abdominal aorta due to calcification. 7. Degenerative change along the lumbar spine, with mild right convex lumbar scoliosis and underlying sclerosis. These results were called by telephone at the time of interpretation on 06/13/2016 at 9:23 pm to Dr. Larae Grooms, who verbally acknowledged these results. Electronically Signed   By: Garald Balding M.D.   On: 06/13/2016 21:24   Ct Image Guided Drainage Percut Cath  Peritoneal Retroperit  Result Date: 06/14/2016 INDICATION: History of ruptured appendicitis. Please perform percutaneous drainage catheter placement for infection source control. EXAM: CT IMAGE GUIDED DRAINAGE PERCUT CATH  PERITONEAL RETROPERIT COMPARISON:  CT abdomen pelvis - 06/13/2016 MEDICATIONS: The patient is currently admitted to the hospital and receiving intravenous antibiotics. The antibiotics were administered within an appropriate time frame prior to the  initiation of the procedure. ANESTHESIA/SEDATION: Moderate (conscious) sedation was employed during this procedure. A total of Versed 1 mg and Fentanyl 50 mcg was administered intravenously. Moderate Sedation Time: 20 minutes. The patient's level of consciousness and vital signs were monitored continuously by radiology nursing throughout the procedure under my direct supervision. CONTRAST:  None COMPLICATIONS: None immediate. PROCEDURE: Informed written consent was obtained from the patient after a discussion of the risks, benefits and alternatives to treatment. The patient was placed supine on the CT gantry and a pre procedural CT was performed re-demonstrating the known abscess/fluid collection within the right lower abdominal quadrant with dominant component measuring approximately 4.2 x 3.6 cm (image 31, series 2). The procedure was planned. A timeout was performed prior to the initiation of the procedure. The skin overlying the right inferior lateral abdomen was prepped and draped in the usual sterile fashion. The overlying soft tissues were anesthetized with 1% lidocaine with epinephrine. Appropriate trajectory was planned with the use of a 22 gauge spinal needle. An 18 gauge trocar needle was advanced into the abscess/fluid collection and a short Amplatz super stiff wire was coiled within the collection. Appropriate positioning was confirmed with a limited CT scan. The tract was serially dilated allowing placement of a 10 [place] all-purpose drainage catheter. Appropriate positioning was confirmed with a limited postprocedural CT scan. Approximately 15 mL of purulent, foul smelling fluid was aspirated. The tube was connected to a drainage bag and sutured in place. A dressing was placed. The patient tolerated the procedure well without immediate post procedural complication. IMPRESSION: Successful CT guided placement of a 10 French all purpose drain catheter into the periappendiceal abscess with aspiration of  15 mL of purulent, foul smelling fluid. Samples were sent to the laboratory as requested by the ordering clinical team. Electronically Signed   By: Sandi Mariscal M.D.   On: 06/14/2016  11:12    Anti-infectives: Anti-infectives    Start     Dose/Rate Route Frequency Ordered Stop   06/14/16 0600  piperacillin-tazobactam (ZOSYN) IVPB 3.375 g     3.375 g 12.5 mL/hr over 240 Minutes Intravenous Every 8 hours 06/13/16 2156     06/13/16 2200  piperacillin-tazobactam (ZOSYN) IVPB 3.375 g     3.375 g 100 mL/hr over 30 Minutes Intravenous  Once 06/13/16 2159 06/13/16 2234   06/13/16 2145  piperacillin-tazobactam (ZOSYN) IVPB 3.375 g  Status:  Discontinued     3.375 g 12.5 mL/hr over 240 Minutes Intravenous Once 06/13/16 2132 06/13/16 2159      Assessment/Plan:  74 year old female with a ruptured appendicitis with periappendiceal abscess. 2 in emotional radiology today for drainage. Discussed with the patient that she be continued on IV antibiotics after the drain was placed for several days until she was pain-free. The drain will remain in place until output was 0. She voiced understanding.  Charles T. Adonis Huguenin, MD, FACS  06/14/2016

## 2016-06-14 NOTE — Procedures (Signed)
Technically successful CT guided placed of a 10 Fr drainage catheter placement into the right lower quadrant peri-appendiceal abscess yielding 15 cc of purulent, foul smelling fluid.   All aspirated samples sent to the laboratory for analysis.   EBL: Minimal No immediate post procedural complications.   Ronny Bacon, MD Pager #: [phone]

## 2016-06-14 NOTE — Progress Notes (Signed)
Inpatient Diabetes Program Recommendations  AACE/ADA: New Consensus Statement on Inpatient Glycemic Control (2015)  Target Ranges:  Prepandial:   less than 140 mg/dL      Peak postprandial:   less than 180 mg/dL (1-2 hours)      Critically ill patients:  140 - 180 mg/dL   Lab Results  Component Value Date   GLUCAP 185 (H) 06/14/2016   HGBA1C 6.6 (H) 05/10/2016    Review of Glycemic Control:  Results for Laurie Harris, Laurie Harris (MRN [id number]) as of 06/14/2016 10:37  Ref. Range 06/13/2016 22:57 06/14/2016 07:19  Glucose-Capillary Latest Ref Range: 65 - 99 mg/dL 196 (H) 185 (H)    Diabetes history: Type 2 diabetes Outpatient Diabetes medications: Humalog 75/25 17 units bid Current orders for Inpatient glycemic control:  Novolog moderate tid with meals and HS  Inpatient Diabetes Program Recommendations:    Consider adding Lantus 10 units daily.   Thanks, Adah Perl, RN, BC-ADM Inpatient Diabetes Coordinator Pager [phone] (8a-5p)

## 2016-06-15 DIAGNOSIS — K352 Acute appendicitis with generalized peritonitis: Secondary | ICD-10-CM

## 2016-06-15 LAB — BASIC METABOLIC PANEL
Anion gap: 4 — ABNORMAL LOW (ref 5–15)
BUN: 18 mg/dL (ref 6–20)
CO2: 25 mmol/L (ref 22–32)
CREATININE: 1.13 mg/dL — AB (ref 0.44–1.00)
Calcium: 8.1 mg/dL — ABNORMAL LOW (ref 8.9–10.3)
Chloride: 108 mmol/L (ref 101–111)
GFR calc Af Amer: 53 mL/min — ABNORMAL LOW (ref >60)
GFR, EST NON AFRICAN AMERICAN: 46 mL/min — AB (ref >60)
Glucose, Bld: 199 mg/dL — ABNORMAL HIGH (ref 65–99)
Potassium: 3.9 mmol/L (ref 3.5–5.1)
SODIUM: 137 mmol/L (ref 135–145)

## 2016-06-15 LAB — CBC
HCT: 27.4 % — ABNORMAL LOW (ref 35.0–47.0)
Hemoglobin: 9.9 g/dL — ABNORMAL LOW (ref 12.0–16.0)
MCH: 34.6 pg — AB (ref 26.0–34.0)
MCHC: 36 g/dL (ref 32.0–36.0)
MCV: 96.3 fL (ref 80.0–100.0)
PLATELETS: 279 10*3/uL (ref 150–440)
RBC: 2.85 MIL/uL — ABNORMAL LOW (ref 3.80–5.20)
RDW: 12.9 % (ref 11.5–14.5)
WBC: 10.1 10*3/uL (ref 3.6–11.0)

## 2016-06-15 LAB — GLUCOSE, CAPILLARY
GLUCOSE-CAPILLARY: 201 mg/dL — AB (ref 65–99)
GLUCOSE-CAPILLARY: 175 mg/dL — AB (ref 65–99)
GLUCOSE-CAPILLARY: 244 mg/dL — AB (ref 65–99)
Glucose-Capillary: 201 mg/dL — ABNORMAL HIGH (ref 65–99)

## 2016-06-15 LAB — HEMOGLOBIN A1C
HEMOGLOBIN A1C: 6.9 % — AB (ref 4.8–5.6)
Mean Plasma Glucose: 151 mg/dL

## 2016-06-15 MED ORDER — AMOXICILLIN-POT CLAVULANATE 875-125 MG PO TABS
1.0000 | ORAL_TABLET | Freq: Two times a day (BID) | ORAL | Status: DC
  Administered 2016-06-15 – 2016-06-16 (×3): 1 via ORAL
  Filled 2016-06-15 (×3): qty 1

## 2016-06-15 NOTE — Progress Notes (Signed)
CC: Perforated appendix Subjective: Patient reports doing okay this morning. She is having some discomfort to the right lower quadrant but states it is tolerable. She has been tolerating a diet without nausea or vomiting.  Objective: Vital signs in last 24 hours: Temp:  [98.7 F (37.1 C)-99.5 F (37.5 C)] 98.7 F (37.1 C) (10/13 1328) Pulse Rate:  [92-97] 96 (10/13 1328) Resp:  [18] 18 (10/13 1328) BP: (132-143)/(55-63) 132/63 (10/13 1328) SpO2:  [91 %-95 %] 95 % (10/13 1328) Last BM Date: 06/14/16  Intake/Output from previous day: 10/12 0701 - 10/13 0700 In: 1896.1 [I.V.:1851; IV Piggyback:40.1] Out: 1550 [Urine:1500; Drains:50] Intake/Output this shift: Total I/O In: 940 [P.O.:240; I.V.:700] Out: 700 [Stool:700]  Physical exam:  Gen.: No acute distress Chest: Clear to auscultation Heart: Rate and rhythm Abdomen: Soft, appropriately tender to palpation at the drain site, nondistended. Drain in place draining a seropurulent fluid.  Lab Results: CBC   Recent Labs  06/14/16 0416 06/15/16 0423  WBC 8.8 10.1  HGB 10.6* 9.9*  HCT 30.9* 27.4*  PLT 298 279   BMET  Recent Labs  06/14/16 0416 06/15/16 0423  NA 139 137  K 4.1 3.9  CL 108 108  CO2 26 25  GLUCOSE 192* 199*  BUN 23* 18  CREATININE 1.02* 1.13*  CALCIUM 8.4* 8.1*   PT/INR  Recent Labs  06/14/16 0416  LABPROT 15.8*  INR 1.25   ABG No results for input(s): PHART, HCO3 in the last 72 hours.  Invalid input(s): PCO2, PO2  Studies/Results: Ct Abdomen Pelvis W Contrast  Result Date: 06/13/2016 CLINICAL DATA:  Acute onset of right lower quadrant abdominal pain. Hyperglycemia. Initial encounter. EXAM: CT ABDOMEN AND PELVIS WITH CONTRAST TECHNIQUE: Multidetector CT imaging of the abdomen and pelvis was performed using the standard protocol following bolus administration of intravenous contrast. CONTRAST:  51mL ISOVUE-300 IOPAMIDOL (ISOVUE-300) INJECTION 61% COMPARISON:  None. FINDINGS: Lower chest:  Scattered coronary artery calcification is noted. Mild calcification is noted at the aortic valve. Minimal bibasilar atelectasis is seen. Hepatobiliary: The liver is unremarkable in appearance. Stones are noted dependently within the gallbladder. The gallbladder is otherwise unremarkable. The common bile duct remains normal in caliber. Pancreas: The pancreas is within normal limits. Spleen: The spleen is unremarkable in appearance. Adrenals/Urinary Tract: The adrenal glands are unremarkable in appearance. Mild bilateral renal atrophy is noted, with mild bilateral perinephric stranding. There is no evidence of hydronephrosis. No renal or ureteral stones are identified. Stomach/Bowel: There is a focal 4.9 x 4.8 x 4.2 cm abscess noted at the right iliac fossa, overlying the right iliopsoas musculature, with scattered fluid and air and containing several appendicoliths. Mild overlying soft tissue inflammation is noted. Findings are compatible with perforated appendicitis. This is contiguous with the distal appendix. Surrounding bowel loops are unremarkable. The proximal appendix is only mildly distended at this time, measuring up to 9 mm. Diffuse diverticulosis is noted along the entirety of the colon, without evidence of diverticulitis. The stomach is partially filled with contrast and is unremarkable in appearance. No small bowel abnormalities are seen. Vascular/Lymphatic: Diffuse calcification is seen along the abdominal aorta and its branches. There is mild luminal narrowing at the infrarenal abdominal aorta due to calcification. The abdominal aorta is otherwise grossly unremarkable. The inferior vena cava is grossly unremarkable. No retroperitoneal lymphadenopathy is seen. No pelvic sidewall lymphadenopathy is identified. Reproductive: The bladder is mildly distended and within normal limits. The uterus is grossly unremarkable in appearance. The ovaries are relatively symmetric. No suspicious adnexal  masses are  seen. Other: No additional soft tissue abnormalities are seen. Musculoskeletal: No acute osseous abnormalities are identified. Multilevel vacuum phenomenon is noted along the lumbar spine, with endplate sclerotic change and mild right convex lumbar scoliosis. The visualized musculature is unremarkable in appearance. IMPRESSION: 1. Focal 4.9 x 4.8 x 4.2 cm abscess at the right iliac fossa, overlying the right iliopsoas musculature, with scattered fluid and air, containing several appendicoliths. Mild overlying soft tissue inflammation noted. Findings compatible with perforated appendicitis. The abscess is contiguous with the distal appendix. 2. Proximal appendix is only mildly distended at this time, measuring up to 9 mm. Surrounding bowel loops are unremarkable. 3. Diffuse diverticulosis along the entirety of the colon, without evidence of diverticulitis. 4. Cholelithiasis; gallbladder otherwise unremarkable. 5. Scattered coronary artery calcification noted. Mild calcification at the aortic valve. 6. Diffuse aortic atherosclerosis noted. Mild luminal narrowing noted at the infrarenal abdominal aorta due to calcification. 7. Degenerative change along the lumbar spine, with mild right convex lumbar scoliosis and underlying sclerosis. These results were called by telephone at the time of interpretation on 06/13/2016 at 9:23 pm to Dr. Larae Grooms, who verbally acknowledged these results. Electronically Signed   By: Garald Balding M.D.   On: 06/13/2016 21:24   Ct Image Guided Drainage Percut Cath  Peritoneal Retroperit  Result Date: 06/14/2016 INDICATION: History of ruptured appendicitis. Please perform percutaneous drainage catheter placement for infection source control. EXAM: CT IMAGE GUIDED DRAINAGE PERCUT CATH  PERITONEAL RETROPERIT COMPARISON:  CT abdomen pelvis - 06/13/2016 MEDICATIONS: The patient is currently admitted to the hospital and receiving intravenous antibiotics. The antibiotics were  administered within an appropriate time frame prior to the initiation of the procedure. ANESTHESIA/SEDATION: Moderate (conscious) sedation was employed during this procedure. A total of Versed 1 mg and Fentanyl 50 mcg was administered intravenously. Moderate Sedation Time: 20 minutes. The patient's level of consciousness and vital signs were monitored continuously by radiology nursing throughout the procedure under my direct supervision. CONTRAST:  None COMPLICATIONS: None immediate. PROCEDURE: Informed written consent was obtained from the patient after a discussion of the risks, benefits and alternatives to treatment. The patient was placed supine on the CT gantry and a pre procedural CT was performed re-demonstrating the known abscess/fluid collection within the right lower abdominal quadrant with dominant component measuring approximately 4.2 x 3.6 cm (image 31, series 2). The procedure was planned. A timeout was performed prior to the initiation of the procedure. The skin overlying the right inferior lateral abdomen was prepped and draped in the usual sterile fashion. The overlying soft tissues were anesthetized with 1% lidocaine with epinephrine. Appropriate trajectory was planned with the use of a 22 gauge spinal needle. An 18 gauge trocar needle was advanced into the abscess/fluid collection and a short Amplatz super stiff wire was coiled within the collection. Appropriate positioning was confirmed with a limited CT scan. The tract was serially dilated allowing placement of a 10 [place] all-purpose drainage catheter. Appropriate positioning was confirmed with a limited postprocedural CT scan. Approximately 15 mL of purulent, foul smelling fluid was aspirated. The tube was connected to a drainage bag and sutured in place. A dressing was placed. The patient tolerated the procedure well without immediate post procedural complication. IMPRESSION: Successful CT guided placement of a 10 French all purpose drain  catheter into the periappendiceal abscess with aspiration of 15 mL of purulent, foul smelling fluid. Samples were sent to the laboratory as requested by the ordering clinical team. Electronically Signed   By:  Sandi Mariscal M.D.   On: 06/14/2016 11:12    Anti-infectives: Anti-infectives    Start     Dose/Rate Route Frequency Ordered Stop   06/15/16 1345  amoxicillin-clavulanate (AUGMENTIN) 875-125 MG per tablet 1 tablet     1 tablet Oral Every 12 hours 06/15/16 1330     06/14/16 0600  piperacillin-tazobactam (ZOSYN) IVPB 3.375 g  Status:  Discontinued     3.375 g 12.5 mL/hr over 240 Minutes Intravenous Every 8 hours 06/13/16 2156 06/15/16 1330   06/13/16 2200  piperacillin-tazobactam (ZOSYN) IVPB 3.375 g     3.375 g 100 mL/hr over 30 Minutes Intravenous  Once 06/13/16 2159 06/13/16 2234   06/13/16 2145  piperacillin-tazobactam (ZOSYN) IVPB 3.375 g  Status:  Discontinued     3.375 g 12.5 mL/hr over 240 Minutes Intravenous Once 06/13/16 2132 06/13/16 2159      Assessment/Plan:  75 year old female status post IR drainage of a periappendiceal abscess from ruptured appendicitis. Doing well. Plan to transition to all oral medications today including oral antibiotics. Encourage ambulation, incentive spirometer usage, oral intake. We will keep her here on oral antibiotics today and recheck her labs in the morning to ensure continued improvement. Possible discharge home tomorrow on antibiotics and with her drain.  Kailand Seda T. Adonis Huguenin, MD, FACS  06/15/2016

## 2016-06-16 DIAGNOSIS — K3532 Acute appendicitis with perforation and localized peritonitis, without abscess: Secondary | ICD-10-CM

## 2016-06-16 LAB — BASIC METABOLIC PANEL
Anion gap: 6 (ref 5–15)
BUN: 18 mg/dL (ref 6–20)
CALCIUM: 8.5 mg/dL — AB (ref 8.9–10.3)
CHLORIDE: 109 mmol/L (ref 101–111)
CO2: 23 mmol/L (ref 22–32)
CREATININE: 1.1 mg/dL — AB (ref 0.44–1.00)
GFR, EST AFRICAN AMERICAN: 55 mL/min — AB (ref >60)
GFR, EST NON AFRICAN AMERICAN: 47 mL/min — AB (ref >60)
Glucose, Bld: 193 mg/dL — ABNORMAL HIGH (ref 65–99)
Potassium: 3.8 mmol/L (ref 3.5–5.1)
SODIUM: 138 mmol/L (ref 135–145)

## 2016-06-16 LAB — GLUCOSE, CAPILLARY: GLUCOSE-CAPILLARY: 188 mg/dL — AB (ref 65–99)

## 2016-06-16 LAB — AEROBIC/ANAEROBIC CULTURE W GRAM STAIN (SURGICAL/DEEP WOUND): Special Requests: NORMAL

## 2016-06-16 LAB — CBC
HCT: 30.4 % — ABNORMAL LOW (ref 35.0–47.0)
HEMOGLOBIN: 10.6 g/dL — AB (ref 12.0–16.0)
MCH: 34.1 pg — AB (ref 26.0–34.0)
MCHC: 34.8 g/dL (ref 32.0–36.0)
MCV: 97.9 fL (ref 80.0–100.0)
PLATELETS: 312 10*3/uL (ref 150–440)
RBC: 3.1 MIL/uL — ABNORMAL LOW (ref 3.80–5.20)
RDW: 13 % (ref 11.5–14.5)
WBC: 8.7 10*3/uL (ref 3.6–11.0)

## 2016-06-16 MED ORDER — HYDROCODONE-ACETAMINOPHEN 5-325 MG PO TABS: 1.0000 | ORAL_TABLET | ORAL | 0 refills | Status: DC | PRN

## 2016-06-16 MED ORDER — AMOXICILLIN-POT CLAVULANATE 875-125 MG PO TABS: 1.0000 | ORAL_TABLET | Freq: Two times a day (BID) | ORAL | 0 refills | Status: DC

## 2016-06-16 MED ORDER — FAMOTIDINE 20 MG PO TABS: 20.0000 mg | ORAL_TABLET | Freq: Every day | ORAL | Status: DC

## 2016-06-16 NOTE — Final Progress Note (Signed)
CC: Drain  Subjective: Patient reports that she is feeling better today. She has tolerated a diet. She tolerated transition to oral medications without any difficulty. She's been having bowel function.  Objective: Vital signs in last 24 hours: Temp:  [98.5 F (36.9 C)-98.7 F (37.1 C)] 98.5 F (36.9 C) (10/14 0508) Pulse Rate:  [84-96] 93 (10/14 0508) Resp:  [18-23] 19 (10/14 0508) BP: (132-167)/(63-76) 167/76 (10/14 0508) SpO2:  [93 %-99 %] 99 % (10/14 0508) Weight:  [58.6 kg (129 lb 1.6 oz)] 58.6 kg (129 lb 1.6 oz) (10/14 0603) Last BM Date: 06/16/16  Intake/Output from previous day: 10/13 0701 - 10/14 0700 In: [id number] [P.O.:840; I.V.:700; IV Piggyback:50] Out: [id number] [Urine:1400; Drains:40; Stool:400] Intake/Output this shift: Total I/O In: 240 [P.O.:240] Out: 600 [Urine:600]  Physical exam:  Gen.: No acute distress Chest: Clear to auscultation Heart: Regular rhythm Abdomen: Soft, appropriately tender to palpation at the drain site, nondistended. Per Jeneen Rinks drain in place draining a seropurulent fluid.  Lab Results: CBC   Recent Labs  06/15/16 0423 06/16/16 0420  WBC 10.1 8.7  HGB 9.9* 10.6*  HCT 27.4* 30.4*  PLT 279 312   BMET  Recent Labs  06/15/16 0423 06/16/16 0420  NA 137 138  K 3.9 3.8  CL 108 109  CO2 25 23  GLUCOSE 199* 193*  BUN 18 18  CREATININE 1.13* 1.10*  CALCIUM 8.1* 8.5*   PT/INR  Recent Labs  06/14/16 0416  LABPROT 15.8*  INR 1.25   ABG No results for input(s): PHART, HCO3 in the last 72 hours.  Invalid input(s): PCO2, PO2  Studies/Results: No results found.  Anti-infectives: Anti-infectives    Start     Dose/Rate Route Frequency Ordered Stop   06/16/16 0000  amoxicillin-clavulanate (AUGMENTIN) 875-125 MG tablet     1 tablet Oral Every 12 hours 06/16/16 1024     06/15/16 1400  amoxicillin-clavulanate (AUGMENTIN) 875-125 MG per tablet 1 tablet     1 tablet Oral Every 12 hours 06/15/16 1330     06/14/16 0600   piperacillin-tazobactam (ZOSYN) IVPB 3.375 g  Status:  Discontinued     3.375 g 12.5 mL/hr over 240 Minutes Intravenous Every 8 hours 06/13/16 2156 06/15/16 1330   06/13/16 2200  piperacillin-tazobactam (ZOSYN) IVPB 3.375 g     3.375 g 100 mL/hr over 30 Minutes Intravenous  Once 06/13/16 2159 06/13/16 2234   06/13/16 2145  piperacillin-tazobactam (ZOSYN) IVPB 3.375 g  Status:  Discontinued     3.375 g 12.5 mL/hr over 240 Minutes Intravenous Once 06/13/16 2132 06/13/16 2159      Assessment/Plan:  75 year old female status post IR placed drain for perforated appendicitis. Tolerated the drainage well. Able to be discharged home today. Discussed appropriate drain care. She'll return to clinic on Wednesday for likely drain removal. She is to keep a log of the drain output between now and then.  Cliffie Gingras T. Adonis Huguenin, MD, FACS  06/16/2016

## 2016-06-16 NOTE — Discharge Instructions (Signed)
Percutaneous Abscess Drain, Care After °Refer to this sheet in the next few weeks. These instructions provide you with information on caring for yourself after your procedure. Your health care provider may also give you more specific instructions. Your treatment has been planned according to current medical practices, but problems sometimes occur. Call your health care provider if you have any problems or questions after your procedure. °WHAT TO EXPECT AFTER THE PROCEDURE °After your procedure, it is typical to have the following:  °· A small amount of discomfort in the area where the drainage tube was placed. °· A small amount of bruising around the area where the drainage tube was placed. °· Sleepiness and fatigue for the rest of the day from the medicines used. °HOME CARE INSTRUCTIONS °· Rest at home for 1-2 days following your procedure or as directed by your health care provider. °· If you go home right after the procedure, plan to have someone with you for 24 hours. °· Do not take a bath or shower for 24 hours after your procedure. °· Take medicines only as directed by your health care provider. Ask your health care provider when you can resume taking any normal medicines. °· Change bandages (dressings) as directed.   °· You may be told to record the amount of drainage from the bag every time you empty it. Follow your health care provider's directions for emptying the bag. Write down the amount of drainage, the date, and the time you emptied it. °· Call your health care provider when the drain is putting out less than 10 mL of drainage per day for 2-3 days in a row or as directed by your health care provider. °· Follow your health care provider's instructions for cleaning the drainage tube. You may need to clean the tube every day so that it does not clog. °SEEK MEDICAL CARE IF: °· You have increased bleeding (more than a small spot) from the site where the drainage tube was placed. °· You have redness,  swelling, or increasing pain around the site where the drainage tube was placed. °· You notice a discharge or bad smell coming from the site where the drainage tube was placed. °· You have a fever or chills.  °· You have pain that is not helped by medicine.   °SEEK IMMEDIATE MEDICAL CARE IF: °· There is leakage around the drainage tube. °· The drainage tube pulls out. °· You suddenly stop having drainage from the tube. °· You suddenly have blood in the drainage fluid. °· You become dizzy or faint. °· You develop a rash.   °· You have nausea or vomiting. °· You have difficulty breathing, feel short of breath, or feel faint.   °· You develop chest pain. °· You have problems with your speech or vision. °· You have trouble balancing or moving your arms or legs. °  °This information is not intended to replace advice given to you by your health care provider. Make sure you discuss any questions you have with your health care provider. °  °Document Released: 01/04/2014 Document Revised: 06/08/2014 Document Reviewed: 01/04/2014 °Elsevier Interactive Patient Education ©2016 [employer] ° °

## 2016-06-16 NOTE — Discharge Summary (Signed)
Patient ID: Laurie Harris MRN: [id number] DOB/AGE: Oct 18, 1940 75 y.o.  Admit date: 06/13/2016 Discharge date: 06/16/2016  Discharge Diagnoses:  Ruptured appendicitis  Procedures Performed: Percutaneous drain of periappendiceal abscess  Discharged Condition: good  Hospital Course: Patient admitted from the ER with an abscess secondary to ruptured appendicitis. Underwent a percutaneous drainage of the abscess cavity by interventional radiology. Tolerated the procedure well and had a gradual improvement in symptoms during her hospital stay. On the day of discharge she was tolerating a diet, had improvement on oral antibiotics, and was doing well.  Discharge Orders:  discharge home, okay to shower. Do not submerge. Follow-up in clinic on Wednesday for drain removal.  Disposition: Home  Discharge Medications:   Medication List    TAKE these medications   alendronate 70 MG tablet Commonly known as:  FOSAMAX TAKE 1 TABLET ONCE A WEEK WITH A FULL GLASS OF WATER   amoxicillin-clavulanate 875-125 MG tablet Commonly known as:  AUGMENTIN Take 1 tablet by mouth every 12 (twelve) hours.   aspirin EC 81 MG tablet Take 81 mg by mouth daily.   atorvastatin 80 MG tablet Commonly known as:  LIPITOR TAKE ONE TABLET EVERY DAY   benazepril 5 MG tablet Commonly known as:  LOTENSIN Take 1 tablet (5 mg total) by mouth daily. What changed:  how much to take   calcium-vitamin D 500-200 MG-UNIT tablet Commonly known as:  OSCAL WITH D Take 1 tablet by mouth 2 (two) times daily.   cholecalciferol 1000 units tablet Commonly known as:  VITAMIN D Take 2,000 Units by mouth daily.   fenofibrate 160 MG tablet Take 160 mg by mouth at bedtime.   Fish Oil 1200 MG Caps Take 1,200 mg by mouth daily.   HUMALOG MIX 75/25 KWIKPEN (75-25) 100 UNIT/ML Kwikpen Generic drug:  Insulin Lispro Prot & Lispro INJECT 15 UNITS TWICE DAILY AS DIRECTED What changed:  See the new instructions.    HYDROcodone-acetaminophen 5-325 MG tablet Commonly known as:  NORCO/VICODIN Take 1-2 tablets by mouth every 4 (four) hours as needed for moderate pain.   multivitamin with minerals Tabs tablet Take 1 tablet by mouth daily.   NOVOFINE PLUS 32G X 4 MM Misc Generic drug:  Insulin Pen Needle USE TWICE DAILY AS DIRECTED   PROTONIX 40 MG tablet Generic drug:  pantoprazole Take 40 mg by mouth daily.        Follwup: Follow-up Information    Clayburn Pert, MD. Go in 4 day(s).   Specialty:  General Surgery Why:  Report to clinic on Wednesday at 3:00 pm for drain removal Contact information: [facility] [place] [address] [phone]           Signed: Clayburn Pert 06/16/2016, 11:11 AM

## 2016-06-16 NOTE — Progress Notes (Signed)
Patient discharged to home as ordered. Patient and husband given instruction on emptying the drain. Husband demonstrated and verbalized understanding on how to empty the drain . Dressing to drain dry and intact. IV discontinued site clean dry and intact. Patient ambulates with minimal assistance,. No complaints of pain and no distress noted.

## 2016-06-16 NOTE — Care Management Note (Signed)
Case Management Note  Patient Details  Name: Laurie Harris MRN: [id number] Date of Birth: 09-11-1940  Subjective/Objective:      No home health services orders. Her [place] RN reports that [name] has been taught how to empty her drain.              Action/Plan:   Expected Discharge Date:                  Expected Discharge Plan:     In-House Referral:     Discharge planning Services     Post Acute Care Choice:    Choice offered to:     DME Arranged:    DME Agency:     HH Arranged:    HH Agency:     Status of Service:     If discussed at [employer] of Stay Meetings, dates discussed:    Additional Comments:  Guillermo Nehring A, RN 06/16/2016, 10:39 AM

## 2016-06-16 NOTE — Progress Notes (Signed)
Key Points: Use following P&T approved IV to PO antibiotic change policy.  Description contains the criteria that are approved Note: Policy Excludes:  Esophagectomy patientsPHARMACIST - PHYSICIAN COMMUNICATION DR:   Dahlia Byes CONCERNING: IV to Oral Route Change Policy  RECOMMENDATION: This patient is receiving famotidine by the intravenous route.  Based on criteria approved by the Pharmacy and Therapeutics Committee, the intravenous medication(s) is/are being converted to the equivalent oral dose form(s).   DESCRIPTION: These criteria include:  The patient is eating (either orally or via tube) and/or has been taking other orally administered medications for a least 24 hours  The patient has no evidence of active gastrointestinal bleeding or impaired GI absorption (gastrectomy, short bowel, patient on TNA or NPO).  If you have questions about this conversion, please contact the Pharmacy Department  []   [phone] )  Laurie Harris [x]   [phone] )  [facility] []   [phone] )  [facility] []   [phone] )  [facility] []   [phone] )  [place], [facility] 06/16/2016 10:50 AM

## 2016-06-19 DIAGNOSIS — I779 Disorder of arteries and arterioles, unspecified: Secondary | ICD-10-CM | POA: Insufficient documentation

## 2016-06-19 DIAGNOSIS — E119 Type 2 diabetes mellitus without complications: Secondary | ICD-10-CM | POA: Insufficient documentation

## 2016-06-19 DIAGNOSIS — I739 Peripheral vascular disease, unspecified: Secondary | ICD-10-CM

## 2016-06-20 ENCOUNTER — Ambulatory Visit (INDEPENDENT_AMBULATORY_CARE_PROVIDER_SITE_OTHER): Payer: Medicare Other | Admitting: General Surgery

## 2016-06-20 ENCOUNTER — Encounter: Payer: Self-pay | Admitting: General Surgery

## 2016-06-20 VITALS — BP 129/73 | HR 108 | Temp 97.3°F | Ht 66.0 in | Wt 127.0 lb

## 2016-06-20 DIAGNOSIS — K3533 Acute appendicitis with perforation and localized peritonitis, with abscess: Secondary | ICD-10-CM

## 2016-06-20 DIAGNOSIS — K353 Acute appendicitis with localized peritonitis: Secondary | ICD-10-CM

## 2016-06-20 DIAGNOSIS — I6523 Occlusion and stenosis of bilateral carotid arteries: Secondary | ICD-10-CM | POA: Diagnosis not present

## 2016-06-20 NOTE — Patient Instructions (Signed)

## 2016-06-20 NOTE — Progress Notes (Signed)
Outpatient Surgical Follow Up  06/20/2016  Laurie Harris is an 75 y.o. female.   Chief Complaint  Patient presents with  . Hospitalization Follow-up    Ruptured Appendicitis 06/13/16-06/16/16; Drain in Place     HPI: 75 year old female returns to clinic for follow-up from ruptured appendicitis and was treated with percutaneous drain. Patient reports that she has felt okay at home. She has been tolerating a diet and having normal bowel function for her. She continues to feel a little weak but states that appears to be improving. She denies any current abdominal pain. She denies any fevers, chills, nausea, vomiting, chest pain, shortness of breath, diarrhea, constipation. Her drain output has been minimal but the tubing has remained full since she left the hospital.  Past Medical History:  Diagnosis Date  . Carotid artery occlusion   . Chicken pox   . Diabetes mellitus without complication ([place])   . Dyspnea   . Hyperlipidemia   . Hypertension   . Renal insufficiency   . Stroke [facility]) 2016    Past Surgical History:  Procedure Laterality Date  . NECK SURGERY      Family History  Problem Relation Age of Onset  . Diabetes Mother   . Cancer Father     colon  . Breast cancer Neg Hx     Social History:  reports that she has never smoked. She has never used smokeless tobacco. She reports that she does not drink alcohol or use drugs.  Allergies: No Known Allergies  Medications reviewed.    ROS  A multipoint review of systems was completed, all pertinent positives and negatives are documented within the history of present illness remainder negative.  BP 129/73 (BP Location: Left Arm, Patient Position: Sitting)   Pulse (!) 108   Temp 97.3 F (36.3 C) (Oral)   Ht 5\' 6"  (1.676 m)   Wt 57.6 kg (127 lb)   BMI 20.50 kg/m   Physical Exam Gen.: No acute distress Neck: Supple and nontender Chest: Clear to auscultation Heart: Tachycardic Abdomen: Soft, minimally tender to  palpation in the right lower quadrant drain site, non-distended. Drain in place with a purulent material within the drain but minimal fluid within the bag.    No results found for this or any previous visit (from the past 48 hour(s)). No results found.  Assessment/Plan:  1. Appendicitis with abscess 75 year old female status post percutaneous drainage of a periappendiceal abscess. Drain tubing was stripped in clinic today by me with immediate slowing of more purulent appearing fluid. Even though the recorded output has been minimal perforated the tubing has been clogged requiring stripping. Instructed patient's husband how to maintain drain care. Patient is still on antibiotics and will continue through till next week. Plan for patient return to clinic in 2 days for an additional drain check and hopefully remove it at that time.  A total of 15 minutes was used on this encounter with greater than 50% of it used for counseling and coordination of care.     Clayburn Pert, MD FACS General Surgeon  06/20/2016,4:28 PM

## 2016-06-22 ENCOUNTER — Ambulatory Visit (INDEPENDENT_AMBULATORY_CARE_PROVIDER_SITE_OTHER): Payer: Medicare Other | Admitting: General Surgery

## 2016-06-22 ENCOUNTER — Encounter: Payer: Self-pay | Admitting: General Surgery

## 2016-06-22 VITALS — BP 142/78 | HR 98 | Temp 97.9°F | Ht 66.0 in | Wt 125.4 lb

## 2016-06-22 DIAGNOSIS — K352 Acute appendicitis with generalized peritonitis: Secondary | ICD-10-CM | POA: Diagnosis not present

## 2016-06-22 DIAGNOSIS — K3532 Acute appendicitis with perforation and localized peritonitis, without abscess: Secondary | ICD-10-CM

## 2016-06-22 DIAGNOSIS — I6523 Occlusion and stenosis of bilateral carotid arteries: Secondary | ICD-10-CM | POA: Diagnosis not present

## 2016-06-22 NOTE — Patient Instructions (Signed)
We will see you back next week. Please see appointment below.  We have removed your drain today. Expect this area to drain over the next 2-3 days. Please keep this area covered until drainage stops.  If you develop a fever >100.5, worsening abdominal pain, nausea or vomiting- call our office immediately.

## 2016-06-22 NOTE — Progress Notes (Signed)
Outpatient Surgical Follow Up  06/22/2016  Laurie Harris is an 75 y.o. female.   Chief Complaint  Patient presents with  . Follow-up    Ruptured Appendicitis (06/16/16)- Dr. Adonis Huguenin    HPI: 75 year old female returns to clinic for follow-up for her perforated appendicitis. Since her last visit they have been taking better care of the drain with gradual decrease in output. Patient reports otherwise feeling very well. She denies any fevers, chills, nausea, vomiting, chest pain, shortness of breath, diarrhea, constipation. Her energy level and her appetite have returned since her last visit. She strongly desires to have the drain removed.  Past Medical History:  Diagnosis Date  . Carotid artery occlusion   . Chicken pox   . Diabetes mellitus without complication ([place])   . Dyspnea   . Hyperlipidemia   . Hypertension   . Renal insufficiency   . Stroke [facility]) 2016    Past Surgical History:  Procedure Laterality Date  . NECK SURGERY      Family History  Problem Relation Age of Onset  . Diabetes Mother   . Cancer Father     colon  . Breast cancer Neg Hx     Social History:  reports that she has never smoked. She has never used smokeless tobacco. She reports that she does not drink alcohol or use drugs.  Allergies: No Known Allergies  Medications reviewed.    ROS A multipoint review of systems was completed, all pertinent positives and negatives are documented within the history of present illness and remainder are negative.   BP (!) 142/78   Pulse 98   Temp 97.9 F (36.6 C) (Oral)   Ht 5\' 6"  (1.676 m)   Wt 56.9 kg (125 lb 6.4 oz)   BMI 20.24 kg/m   Physical Exam Gen.: No acute distress Neck: Supple and nontender Chest: Clear to auscultation without accessory muscle usage Heart: Regular rate and rhythm Abdomen: Soft, nontender, nondistended. Previous drain and placed in the right lower quadrant with minimal to no output in the drain or the bag. No erythema or  purulence coming from the insertion site Extremity: Moves all extremities well.    No results found for this or any previous visit (from the past 48 hour(s)). No results found.  Assessment/Plan:  1. Perforated appendicitis 75 year old female status post IR drainage of perforated appendicitis. Much improved. Drain removed in clinic today without any difficulty. Patient is to continue her antibiotics until next week as previously prescribed. Discussed at length that after removing this tube there is a risk of the infection coming back and counseled patient that should her pain, fevers, weakness or any other symptom return she is to clinic immediately or report to the emergency department that happens over the weekend. They voiced understanding. They'll follow-up in clinic next week for an additional exam and to discuss whether or not any further therapy is indicated.     Clayburn Pert, MD FACS General Surgeon  06/22/2016,12:24 PM

## 2016-06-28 ENCOUNTER — Encounter: Payer: Self-pay | Admitting: Surgery

## 2016-06-28 ENCOUNTER — Ambulatory Visit (INDEPENDENT_AMBULATORY_CARE_PROVIDER_SITE_OTHER): Payer: Medicare Other | Admitting: Surgery

## 2016-06-28 VITALS — BP 122/69 | HR 80 | Temp 97.5°F | Ht 66.0 in | Wt 124.4 lb

## 2016-06-28 DIAGNOSIS — K353 Acute appendicitis with localized peritonitis: Secondary | ICD-10-CM | POA: Diagnosis not present

## 2016-06-28 DIAGNOSIS — K3532 Acute appendicitis with perforation and localized peritonitis, without abscess: Secondary | ICD-10-CM

## 2016-06-28 DIAGNOSIS — I6523 Occlusion and stenosis of bilateral carotid arteries: Secondary | ICD-10-CM | POA: Diagnosis not present

## 2016-06-28 DIAGNOSIS — K3533 Acute appendicitis with perforation and localized peritonitis, with abscess: Secondary | ICD-10-CM

## 2016-06-28 DIAGNOSIS — K352 Acute appendicitis with generalized peritonitis: Secondary | ICD-10-CM

## 2016-06-28 NOTE — Progress Notes (Signed)
Outpatient visit  06/28/2016  Laurie Harris is an 75 y.o. female.    Procedure: CT-guided drainage of appendiceal abscess  CC: Weakness  HPI: Status postruptured appendicitis drain was removed at her last visit. Patient has a good appetite but feels weak she is a stroke victim from 2016. She has no abdominal pain no fevers or chills is feeling well having normal bowel movements no nausea or vomiting. Medications reviewed.  Medications and history is thoroughly reviewed from prior hospitalization by Dr. Adonis Huguenin  Physical Exam:  BP 122/69   Pulse 80   Temp 97.5 F (36.4 C) (Oral)   Ht 5\' 6"  (1.676 m)   Wt 124 lb 6.4 oz (56.4 kg)   BMI 20.08 kg/m     PE: Afebrile Abdomen is soft nondistended nontender drain site in the right lower quadrant shows no erythema no drainage. Calves are nontender minimal edema neuro is grossly intact with some weakness. No icterus no jaundice    Assessment/Plan:  This a patient had a ruptured appendix with abscess which was percutaneously drained. I discussed with she and her husband the potential for an interval appendectomy at some point and the unpredictable nature of appendicitis recurring. Fact that she has multiple medical problems needs to be taken into account as well.  At this point I would suggest that she use an sure to improve her strength but will proceed with a CT scan of the abdomen and pelvis and 3 weeks with follow-up with Dr. Adonis Huguenin to discuss the potential for interval appendectomy as well as some of the controversies which were discussed with she and her husband today.  Florene Glen, MD, FACS

## 2016-06-28 NOTE — Patient Instructions (Signed)
We have a CT scan scheduled for 07/19/16 at 9:15 at [facility]  Please go by there today and pick up your contrast. They will give you instructions to follow with the contrast. Please do not have anything to eat or drink 4 hours prior to having your scan. Please see your follow up appointment with Dr.Woodham listed below. Please call our office if you have any questions or concerns.

## 2016-07-12 ENCOUNTER — Telehealth: Payer: Self-pay | Admitting: Family Medicine

## 2016-07-12 NOTE — Telephone Encounter (Signed)
Pt called and stated that [facility] is no longer going to be supplying diabetic medical supplies, as of the end of November. Can you recommended somewhere else that she may use. Please advise, thank you!  Call pt [phone]

## 2016-07-12 NOTE — Telephone Encounter (Signed)
Please advise 

## 2016-07-18 NOTE — Telephone Encounter (Signed)
I am unsure of the answer to this. I will forward to Micheline Maze to see if she knows of a specific diabetic supply company. Laurie Harris, can you also check with the patient to see what supplies she needs? Thanks.

## 2016-07-18 NOTE — Telephone Encounter (Signed)
I spoke with patient & recommended that she contacts her pharmacy to see who they typically recommend. She will let us know who she chooses so that we can sent them over a new Rx or will discuss at her next visit since she has plenty of supplies left right now.

## 2016-07-19 ENCOUNTER — Ambulatory Visit
Admission: RE | Admit: 2016-07-19 | Discharge: 2016-07-19 | Disposition: A | Discharge status: Home / Self Care | Payer: Medicare Other | Source: Ambulatory Visit | Attending: Surgery | Admitting: Surgery

## 2016-07-19 ENCOUNTER — Telehealth: Payer: Self-pay

## 2016-07-19 DIAGNOSIS — I700 Atherosclerosis of aorta: Secondary | ICD-10-CM | POA: Insufficient documentation

## 2016-07-19 DIAGNOSIS — K5730 Diverticulosis of large intestine without perforation or abscess without bleeding: Secondary | ICD-10-CM | POA: Diagnosis not present

## 2016-07-19 DIAGNOSIS — K353 Acute appendicitis with localized peritonitis: Secondary | ICD-10-CM | POA: Diagnosis not present

## 2016-07-19 DIAGNOSIS — K3533 Acute appendicitis with perforation and localized peritonitis, with abscess: Secondary | ICD-10-CM

## 2016-07-19 DIAGNOSIS — N3289 Other specified disorders of bladder: Secondary | ICD-10-CM | POA: Diagnosis not present

## 2016-07-19 DIAGNOSIS — K8020 Calculus of gallbladder without cholecystitis without obstruction: Secondary | ICD-10-CM | POA: Diagnosis not present

## 2016-07-19 MED ORDER — IOPAMIDOL (ISOVUE-300) INJECTION 61%
75.0000 mL | Freq: Once | INTRAVENOUS | Status: AC | PRN
  Administered 2016-07-19: 75 mL via INTRAVENOUS

## 2016-07-19 NOTE — Telephone Encounter (Signed)
Patients CT results are back. Spoke with Laurie Harris regarding the results and asked if Dr.Loflin can look at them. Patient is on Dr.Loflin schedule 07/20/16 @ 11:00 am. [name] stated she would review the results and see that Dr.Loflin does too.

## 2016-07-20 ENCOUNTER — Ambulatory Visit: Payer: Self-pay | Admitting: General Surgery

## 2016-07-20 ENCOUNTER — Ambulatory Visit (INDEPENDENT_AMBULATORY_CARE_PROVIDER_SITE_OTHER): Payer: Medicare Other | Admitting: Surgery

## 2016-07-20 ENCOUNTER — Encounter: Payer: Self-pay | Admitting: Surgery

## 2016-07-20 VITALS — BP 147/68 | HR 77 | Temp 98.3°F | Ht 66.0 in | Wt 125.0 lb

## 2016-07-20 DIAGNOSIS — K3532 Acute appendicitis with perforation and localized peritonitis, without abscess: Secondary | ICD-10-CM

## 2016-07-20 DIAGNOSIS — I6523 Occlusion and stenosis of bilateral carotid arteries: Secondary | ICD-10-CM

## 2016-07-20 DIAGNOSIS — K352 Acute appendicitis with generalized peritonitis: Secondary | ICD-10-CM

## 2016-07-20 NOTE — Patient Instructions (Signed)
Please call with questions or concerns

## 2016-07-20 NOTE — Progress Notes (Signed)
Outpatient Surgical Follow Up  07/20/2016  Laurie Harris is an 75 y.o. female seen for the diagnosis of Perforated appendicitis [K35.2].  HPI: Patient seen and examined in clinic. 75 year old female who had perforated appendicitis with a drain. The drain was removed 2 weeks ago. The patient has otherwise been doing well she denies any abdominal pain states that her energy and appetite are slowly starting to improve. Denies any fever chills and hasn't had any additional pain.  Past Medical History:  Diagnosis Date  . Carotid artery occlusion   . Chicken pox   . Diabetes mellitus without complication ([place])   . Dyspnea   . Hyperlipidemia   . Hypertension   . Renal insufficiency   . Stroke [facility]) 2016    Past Surgical History:  Procedure Laterality Date  . NECK SURGERY      Family History  Problem Relation Age of Onset  . Diabetes Mother   . Cancer Father     colon  . Breast cancer Neg Hx     Social History:  reports that she has never smoked. She has never used smokeless tobacco. She reports that she does not drink alcohol or use drugs.  Allergies: No Known Allergies  Medications reviewed.  Physical Exam:  BP (!) 147/68   Pulse 77   Temp 98.3 F (36.8 C) (Oral)   Ht 5\' 6"  (1.676 m)   Wt 125 lb (56.7 kg)   BMI 20.18 kg/m   Gen: patient resting comfortably in clinic, no cardiovascular or respiratory distress Abd/GI: Soft nontender nondistended drain site almost completely healed.  No results found for this or any previous visit (from the past 48 hour(s)). Ct Abdomen Pelvis W Contrast  Result Date: 07/19/2016 CLINICAL DATA:  75 year old female -followup ruptured appendicitis and peritoneal abscess with drainage on 06/13/2016. EXAM: CT ABDOMEN AND PELVIS WITH CONTRAST TECHNIQUE: Multidetector CT imaging of the abdomen and pelvis was performed using the standard protocol following bolus administration of intravenous contrast. CONTRAST:  75 cc intravenous Isovue-300  COMPARISON:  06/14/2016 and 06/13/2016 CTs FINDINGS: Lower chest: No acute abnormality. Coronary artery and proximal thoracic aortic atherosclerotic calcifications noted. Hepatobiliary: The liver is unremarkable. Cholelithiasis again identified without CT evidence of acute cholecystitis. There is no evidence of biliary dilatation. Pancreas: Unremarkable Spleen: Unremarkable Adrenals/Urinary Tract: Bilateral renal cortical atrophy again noted. No hydronephrosis, renal mass or ureteral calculi identified. A calcification within the bladder/ left UPJ region noted. The adrenal glands are unremarkable. Stomach/Bowel: Interval resolution of para-appendiceal abscess and inflammation noted with small amount of soft tissue/ postinflammatory changes in the area. Oral contrast now fills the appendix. Colonic diverticulosis again identified without evidence of acute diverticulitis. There is no evidence of bowel obstruction or pneumoperitoneum. Vascular/Lymphatic: Aortic atherosclerotic calcifications noted without aneurysm. No enlarged lymph nodes identified. Reproductive: Unremarkable Other: No free fluid. Musculoskeletal: No acute abnormality. Lumbar scoliosis and degenerative disc disease/spondylosis again noted. IMPRESSION: Resolution of para-appendiceal abscess and inflammation, with small amount of residual soft tissue/postinflammatory changes. Oral contrast now fills the appendix. No acute abnormalities. Unchanged 5 mm calcification within the bladder/left UPJ region. No evidence of hydroureter or hydronephrosis. Cholelithiasis and colonic diverticulosis. Abdominal aortic atherosclerosis. Electronically Signed   By: Margarette Canada M.D.   On: 07/19/2016 12:34    Assessment/Plan: Laurie Harris is an 75 y.o. female seen for the diagnosis of Perforated appendicitis [K35.2]. Progressing as expected. I reviewed the notes from my partners Dr. Adonis Huguenin and Burt Knack on this patient as well as reviewed her laboratory  values and  personally reviewed her CT scan images. The patient got a CT scan yesterday which showed complete resolution of the area and is still abscess as well as good contrast flow into the appendix without any signs of inflammation enlargement or fecalith.  I discussed with the patient and her husband that over in [place] they've been treating appendicitis with antibiotics for many years. With the data we have from that we know that about 20% of the patients will fail initial management with antibiotics and drainage. For the patient's that do not need an operation only about 20% of them will need one on down the line. Since the patient is not having any further symptoms at this time and her CT scan is completely normal I would recommend holding off on any operative intervention unless the patient has continued symptoms. The patient understands that if she begins to have the right lower quadrant pain again she is to call immediately.  Time spent with the patient was 20 minutes, with more than 50% of the time spent in face-to-face education, counseling and care coordination.     Rithika Seel L. Korbyn Vanes MD General Surgeon  07/20/2016,4:10 PM

## 2016-08-07 ENCOUNTER — Encounter: Payer: Self-pay | Admitting: Family Medicine

## 2016-08-07 ENCOUNTER — Ambulatory Visit (INDEPENDENT_AMBULATORY_CARE_PROVIDER_SITE_OTHER): Payer: Medicare Other | Admitting: Family Medicine

## 2016-08-07 DIAGNOSIS — E118 Type 2 diabetes mellitus with unspecified complications: Secondary | ICD-10-CM | POA: Diagnosis not present

## 2016-08-07 DIAGNOSIS — Z794 Long term (current) use of insulin: Secondary | ICD-10-CM

## 2016-08-07 DIAGNOSIS — I614 Nontraumatic intracerebral hemorrhage in cerebellum: Secondary | ICD-10-CM | POA: Diagnosis not present

## 2016-08-07 DIAGNOSIS — K36 Other appendicitis: Secondary | ICD-10-CM

## 2016-08-07 DIAGNOSIS — Z8719 Personal history of other diseases of the digestive system: Secondary | ICD-10-CM | POA: Insufficient documentation

## 2016-08-07 DIAGNOSIS — I6523 Occlusion and stenosis of bilateral carotid arteries: Secondary | ICD-10-CM | POA: Diagnosis not present

## 2016-08-07 DIAGNOSIS — I10 Essential (primary) hypertension: Secondary | ICD-10-CM

## 2016-08-07 HISTORY — DX: Personal history of other diseases of the digestive system: Z87.19

## 2016-08-07 NOTE — Assessment & Plan Note (Signed)
Blood pressure seems to be on the low side especially diastolically. She is asymptomatic. We will have her decrease her benazepril dose to 2.5 mg daily. She'll continue to monitor and if her blood pressures are still less than 100/60 consistently she will let us know.

## 2016-08-07 NOTE — Assessment & Plan Note (Signed)
Seems to be well controlled. Most recent A1c about 6 weeks ago. She'll continue current dosing. Discussed injection sites in her abdomen, upper legs, or upper arms. Offered to have her see a pharmacist to discuss this further with her though she declined. She'll continue to monitor and let us know if she decides to see a pharmacist.

## 2016-08-07 NOTE — Assessment & Plan Note (Signed)
Stable. No new deficits. Continue to monitor. Given return precautions.

## 2016-08-07 NOTE — Patient Instructions (Signed)
Nice to see you. We're going to half your dose of benazepril to 2.5 mg daily. Please continue to monitor your blood pressure and if it is consistently less than 100/60 please let us know. Please monitor for recurrence of abdominal pain and if this occurs please let us know. You may attempt to inject your insulin in your subcutaneous tissue of your stomach, upper arm, or upper leg. If he would likely get IV see a diabetic educator to go over this with you.

## 2016-08-07 NOTE — Progress Notes (Signed)
Tommi Rumps, MD Phone: [phone]  Laurie Harris is a 75 y.o. female who presents today for follow-up.  DIABETES Disease Monitoring: Blood Sugar ranges-111-150 fasting, up to 216 in the evening Polyuria/phagia/dipsia- no       Medications: Compliance- taking Humalog 75-20 517 units twice daily, does report having some difficulty finding a place to inject the insulin in her abdomen. Notes she has to move the injection site around due to knots that develop after injecting. Hypoglycemic symptoms- no  HYPERTENSION  Disease Monitoring  Home BP Monitoring 99-1 21/43-67 Chest pain- no    Dyspnea- no Medications  Compliance-  taking benazepril 5 mg daily. Lightheadedness-  no  Edema- no  History of hemorrhagic stroke: No new neurological deficits. Notes no numbness, weakness, vision changes, or any new symptoms. Does note mild stable imbalance if she bends over though this is significantly improved compared to when she had the stroke. No lightheadedness.  Patient was seen for appendicitis and admitted to the hospital. She had a drain placed and was treated with antibiotics. She's been following with surgery for this. She notes no recurrent abdominal pain, fevers, or chills. Recently had a CT scan that returned with no findings of infection.  PMH: nonsmoker.   ROS see history of present illness  Objective  Physical Exam Vitals:   08/07/16 1041  BP: 126/78  Pulse: 73  Temp: 97.8 F (36.6 C)    BP Readings from Last 3 Encounters:  08/07/16 126/78  07/20/16 (!) 147/68  06/28/16 122/69   Wt Readings from Last 3 Encounters:  08/07/16 127 lb 3.2 oz (57.7 kg)  07/20/16 125 lb (56.7 kg)  06/28/16 124 lb 6.4 oz (56.4 kg)    Physical Exam  Constitutional: No distress.  HENT:  Mouth/Throat: Oropharynx is clear and moist. No oropharyngeal exudate.  Eyes: Conjunctivae are normal. Pupils are equal, round, and reactive to light.  Cardiovascular: Normal rate, regular rhythm and  normal heart sounds.   Pulmonary/Chest: Effort normal and breath sounds normal.  Abdominal: Soft. Bowel sounds are normal. She exhibits no distension. There is no tenderness.  Musculoskeletal: She exhibits no edema.  Neurological: She is alert. Gait normal.  CN 2-12 intact, 5/5 strength in bilateral biceps, triceps, grip, quads, hamstrings, plantar and dorsiflexion, sensation to light touch intact in bilateral UE and LE, normal gait, 2+ patellar reflexes  Skin: Skin is warm and dry. She is not diaphoretic.     Assessment/Plan: Please see individual problem list.  Cerebellar hemorrhage Stable. No new deficits. Continue to monitor. Given return precautions.  Diabetes mellitus type 2, controlled, with complications Seems to be well controlled. Most recent A1c about 6 weeks ago. She'll continue current dosing. Discussed injection sites in her abdomen, upper legs, or upper arms. Offered to have her see a pharmacist to discuss this further with her though she declined. She'll continue to monitor and let us know if she decides to see a pharmacist.  Appendicitis Patient doing well status post appendicitis with drain placement and antibiotic treatment. She's following with surgery and the plan is to continue to monitor. No surgical intervention planned at this time. She'll monitor for recurrence. Given return precautions.  Benign essential HTN Blood pressure seems to be on the low side especially diastolically. She is asymptomatic. We will have her decrease her benazepril dose to 2.5 mg daily. She'll continue to monitor and if her blood pressures are still less than 100/60 consistently she will let us know.   Tommi Rumps, MD [place]  [place]

## 2016-08-07 NOTE — Assessment & Plan Note (Signed)
Patient doing well status post appendicitis with drain placement and antibiotic treatment. She's following with surgery and the plan is to continue to monitor. No surgical intervention planned at this time. She'll monitor for recurrence. Given return precautions.

## 2016-08-07 NOTE — Progress Notes (Signed)
Pre visit review using our clinic review tool, if applicable. No additional management support is needed unless otherwise documented below in the visit note. 

## 2016-08-15 ENCOUNTER — Other Ambulatory Visit: Payer: Self-pay | Admitting: Family Medicine

## 2016-09-10 ENCOUNTER — Observation Stay: Payer: Medicare Other

## 2016-09-10 ENCOUNTER — Observation Stay
Admission: EM | Admit: 2016-09-10 | Discharge: 2016-09-11 | Disposition: A | Discharge status: Home / Self Care | Payer: Medicare Other | Attending: Internal Medicine | Admitting: Internal Medicine

## 2016-09-10 ENCOUNTER — Encounter: Payer: Self-pay | Admitting: Emergency Medicine

## 2016-09-10 ENCOUNTER — Emergency Department: Payer: Medicare Other

## 2016-09-10 DIAGNOSIS — I6523 Occlusion and stenosis of bilateral carotid arteries: Secondary | ICD-10-CM | POA: Insufficient documentation

## 2016-09-10 DIAGNOSIS — I129 Hypertensive chronic kidney disease with stage 1 through stage 4 chronic kidney disease, or unspecified chronic kidney disease: Secondary | ICD-10-CM | POA: Diagnosis not present

## 2016-09-10 DIAGNOSIS — Z794 Long term (current) use of insulin: Secondary | ICD-10-CM | POA: Diagnosis not present

## 2016-09-10 DIAGNOSIS — Z7982 Long term (current) use of aspirin: Secondary | ICD-10-CM | POA: Insufficient documentation

## 2016-09-10 DIAGNOSIS — N289 Disorder of kidney and ureter, unspecified: Secondary | ICD-10-CM | POA: Diagnosis not present

## 2016-09-10 DIAGNOSIS — K219 Gastro-esophageal reflux disease without esophagitis: Secondary | ICD-10-CM | POA: Insufficient documentation

## 2016-09-10 DIAGNOSIS — E785 Hyperlipidemia, unspecified: Secondary | ICD-10-CM | POA: Diagnosis not present

## 2016-09-10 DIAGNOSIS — R42 Dizziness and giddiness: Secondary | ICD-10-CM

## 2016-09-10 DIAGNOSIS — R55 Syncope and collapse: Principal | ICD-10-CM | POA: Diagnosis present

## 2016-09-10 DIAGNOSIS — Z79899 Other long term (current) drug therapy: Secondary | ICD-10-CM | POA: Insufficient documentation

## 2016-09-10 DIAGNOSIS — E119 Type 2 diabetes mellitus without complications: Secondary | ICD-10-CM | POA: Diagnosis not present

## 2016-09-10 DIAGNOSIS — Z8673 Personal history of transient ischemic attack (TIA), and cerebral infarction without residual deficits: Secondary | ICD-10-CM | POA: Diagnosis not present

## 2016-09-10 DIAGNOSIS — E860 Dehydration: Secondary | ICD-10-CM | POA: Diagnosis not present

## 2016-09-10 DIAGNOSIS — I10 Essential (primary) hypertension: Secondary | ICD-10-CM | POA: Diagnosis not present

## 2016-09-10 DIAGNOSIS — N183 Chronic kidney disease, stage 3 (moderate): Secondary | ICD-10-CM | POA: Diagnosis not present

## 2016-09-10 DIAGNOSIS — Z7983 Long term (current) use of bisphosphonates: Secondary | ICD-10-CM | POA: Diagnosis not present

## 2016-09-10 DIAGNOSIS — N179 Acute kidney failure, unspecified: Secondary | ICD-10-CM | POA: Diagnosis not present

## 2016-09-10 DIAGNOSIS — J9811 Atelectasis: Secondary | ICD-10-CM | POA: Diagnosis not present

## 2016-09-10 DIAGNOSIS — E1122 Type 2 diabetes mellitus with diabetic chronic kidney disease: Secondary | ICD-10-CM | POA: Insufficient documentation

## 2016-09-10 DIAGNOSIS — M6281 Muscle weakness (generalized): Secondary | ICD-10-CM

## 2016-09-10 LAB — CBC
HCT: 38.7 % (ref 35.0–47.0)
HEMOGLOBIN: 13.2 g/dL (ref 12.0–16.0)
MCH: 33.3 pg (ref 26.0–34.0)
MCHC: 34.2 g/dL (ref 32.0–36.0)
MCV: 97.2 fL (ref 80.0–100.0)
PLATELETS: 210 10*3/uL (ref 150–440)
RBC: 3.98 MIL/uL (ref 3.80–5.20)
RDW: 14.5 % (ref 11.5–14.5)
WBC: 7.4 10*3/uL (ref 3.6–11.0)

## 2016-09-10 LAB — BASIC METABOLIC PANEL
ANION GAP: 5 (ref 5–15)
BUN: 42 mg/dL — ABNORMAL HIGH (ref 6–20)
CHLORIDE: 108 mmol/L (ref 101–111)
CO2: 25 mmol/L (ref 22–32)
CREATININE: 1.37 mg/dL — AB (ref 0.44–1.00)
Calcium: 9.8 mg/dL (ref 8.9–10.3)
GFR calc non Af Amer: 36 mL/min — ABNORMAL LOW (ref >60)
GFR, EST AFRICAN AMERICAN: 42 mL/min — AB (ref >60)
Glucose, Bld: 161 mg/dL — ABNORMAL HIGH (ref 65–99)
POTASSIUM: 3.9 mmol/L (ref 3.5–5.1)
SODIUM: 138 mmol/L (ref 135–145)

## 2016-09-10 LAB — TROPONIN I: Troponin I: <0.03 ng/mL (ref <0.03)

## 2016-09-10 MED ORDER — SODIUM CHLORIDE 0.9 % IV BOLUS (SEPSIS)
1000.0000 mL | Freq: Once | INTRAVENOUS | Status: AC
  Administered 2016-09-10: 1000 mL via INTRAVENOUS

## 2016-09-10 NOTE — ED Triage Notes (Signed)
Per acems: pt. Was cooking, felt funny, sat on floor, syncopal episode, unresponsive for approx. 2 minutes per husband, states "still feels funny"  Hx stroke, denies n/v cbg 194, vital WNL

## 2016-09-10 NOTE — ED Notes (Signed)
Ortho vitals  Laying BP 145/59 P 78 O2 98  Sitting BP 125/64 P79 O2 98  Standing BP 141/67 P91 O2 98  LM EDT

## 2016-09-10 NOTE — ED Notes (Signed)
Pt unable to go to floor due to lack of off unit telemetry boxes at this time.  Charge nurse Raquel notified.

## 2016-09-10 NOTE — H&P (Addendum)
History and Physical   [facility] - [place] @ [facility] Admission History and Physical [employer], D.O.    Patient Name: Laurie Harris MR#: [id number] Date of Birth: [birth year] Date of Admission: 09/10/2016  Referring MD/NP/PA: Dr. Mariea Clonts Primary Care Physician: Tommi Rumps, MD Patient coming from: Home  Chief Complaint: Passed out  HPI: Laurie Harris is a 76 y.o. female with a known history of HTN, HLD, intracranial hemorrhage secondary to warfarin, chronic kidney disease, DM, carotid artery stenosis was in a usual state of health until this evening when she was standing at the stove cooking dinner. She said that she felt lightheaded, dizzy and generally weak. She sat down to collect herself and had a short period of loss of consciousness. Patient has been witnessed the event and denied any seizure-like activity, incontinence. There was no fall, no head trauma.  Patient has a complicated history of intracranial hemorrhage secondary to warfarin use in May 2016. Is unclear why she was on warfarin. She does have known carotid artery stenosis with most recent ultrasound in 2016 showing less than 50% stenosis. Of note she also had a decrease in her benazepril dosing secondary to hypotension.   Otherwise there has been no change in status. Patient has been taking medication as prescribed and there has been no recent change in medication or diet.  No recent antibiotics.  There has been no recent illness, hospitalizations, travel or sick contacts.    Patient denies fevers/chills, chest pain, shortness of breath, N/V/C/D, abdominal pain, dysuria/frequency.  Review of Systems:  CONSTITUTIONAL: No fever/chills, fatigue,  weight gain/loss, headache. Positive dizziness, lightheadedness and generalized weakness prior to the event EYES: No blurry or double vision. ENT: No tinnitus, postnasal drip, redness or soreness of the oropharynx. RESPIRATORY: No cough, dyspnea, wheeze.  No  hemoptysis.  CARDIOVASCULAR: No chest pain, palpitations, syncope, orthopnea. No lower extremity edema.  GASTROINTESTINAL: No nausea, vomiting, abdominal pain, diarrhea, constipation.  No hematemesis, melena or hematochezia. GENITOURINARY: No dysuria, frequency, hematuria. ENDOCRINE: No polyuria or nocturia. No heat or cold intolerance. HEMATOLOGY: No anemia, bruising, bleeding. INTEGUMENTARY: No rashes, ulcers, lesions. MUSCULOSKELETAL: No arthritis, gout, dyspnea. NEUROLOGIC: No numbness, tingling, ataxia, seizure-type activity, weakness. PSYCHIATRIC: No anxiety, depression, insomnia.   Past Medical History:  Diagnosis Date  . Carotid artery occlusion   . Chicken pox   . Diabetes mellitus without complication ([place])   . Dyspnea   . Hyperlipidemia   . Hypertension   . Renal insufficiency   . Stroke [facility]) 2016    Past Surgical History:  Procedure Laterality Date  . NECK SURGERY       reports that she has never smoked. She has never used smokeless tobacco. She reports that she does not drink alcohol or use drugs.  No Known Allergies  Family History  Problem Relation Age of Onset  . Diabetes Mother   . Cancer Father     colon  . Breast cancer Neg Hx    Family history has been reviewed and confirmed with patient.   Prior to Admission medications   Medication Sig Start Date End Date Taking? Authorizing Provider  alendronate (FOSAMAX) 70 MG tablet TAKE 1 TABLET EVERY 7 DAYS WITH A FULL GLASS OF WATER ON AN EMPTY STOMACH DO NOT LIE DOWN FOR AT LEAST 30 MIN 08/15/16  Yes [name], MD  aspirin EC 81 MG tablet Take 81 mg by mouth daily.   Yes Historical Provider, MD  atorvastatin (LIPITOR) 80 MG tablet TAKE ONE TABLET EVERY  DAY 12/16/15  Yes Rubbie Battiest, NP  benazepril (LOTENSIN) 5 MG tablet Take 1 tablet (5 mg total) by mouth daily. Patient taking differently: Take 2.5 mg by mouth daily.  05/09/16  Yes [name], MD  calcium-vitamin D (OSCAL WITH D) 500-200  MG-UNIT tablet Take 1 tablet by mouth 2 (two) times daily.   Yes Historical Provider, MD  cholecalciferol (VITAMIN D) 1000 units tablet Take 2,000 Units by mouth daily.   Yes Historical Provider, MD  fenofibrate 160 MG tablet Take 160 mg by mouth at bedtime.   Yes Historical Provider, MD  HUMALOG MIX 75/25 KWIKPEN (75-25) 100 UNIT/ML Kwikpen INJECT 15 UNITS TWICE DAILY AS DIRECTED Patient taking differently: INJECT 17 UNITS TWICE DAILY AS DIRECTED 03/15/16  Yes Jackolyn Confer, MD  Multiple Vitamin (MULTIVITAMIN WITH MINERALS) TABS tablet Take 1 tablet by mouth daily.   Yes Historical Provider, MD  NOVOFINE PLUS 32G X 4 MM MISC USE TWICE DAILY AS DIRECTED 03/15/16  Yes Jackolyn Confer, MD  Omega-3 Fatty Acids (FISH OIL) 1200 MG CAPS Take 1,200 mg by mouth daily.   Yes Historical Provider, MD  pantoprazole (PROTONIX) 40 MG tablet Take 40 mg by mouth daily.   Yes Historical Provider, MD    Physical Exam: Vitals:   09/10/16 1928 09/10/16 1934  BP:  (!) 154/66  Pulse:  83  Resp:  20  Temp:  98.3 F (36.8 C)  TempSrc:  Oral  SpO2:  96%  Weight: 56.7 kg (125 lb)   Height: 5\' 6"  (1.676 m)     GENERAL: 76 y.o.-year-old White female patient, well-developed, well-nourished lying in the bed in no acute distress.  Pleasant and cooperative.   HEENT: Head atraumatic, normocephalic. Pupils equal, round, reactive to light and accommodation. No scleral icterus. Extraocular muscles intact. Nares are patent. Oropharynx is clear. Mucus membranes dry. NECK: Supple, full range of motion. Bilateral carotid bruit heard. No thyroid enlargement, no tenderness, no cervical lymphadenopathy. CHEST: Normal breath sounds bilaterally. No wheezing, rales, rhonchi or crackles. No use of accessory muscles of respiration.  No reproducible chest wall tenderness.  CARDIOVASCULAR: S1, S2 normal. No murmurs, rubs, or gallops. Cap refill <2 seconds. Pulses intact distally.  ABDOMEN: Soft, nondistended, nontender. No rebound,  guarding, rigidity. Normoactive bowel sounds present in all four quadrants. No organomegaly or mass. EXTREMITIES: No pedal edema, cyanosis, or clubbing. No calf tenderness or Homan's sign.  NEUROLOGIC: The patient is alert and oriented x 3. Cranial nerves II through XII are grossly intact with no focal sensorimotor deficit. Muscle strength 5/5 in all extremities. Sensation intact. Gait not checked. PSYCHIATRIC:  Normal affect, mood, thought content. SKIN: Warm, dry, and intact without obvious rash, lesion, or ulcer.    Labs on Admission:  CBC:  Recent Labs Lab 09/10/16 1940  WBC 7.4  HGB 13.2  HCT 38.7  MCV 97.2  PLT [id number]   Basic Metabolic Panel:  Recent Labs Lab 09/10/16 1940  NA 138  K 3.9  CL 108  CO2 25  GLUCOSE 161*  BUN 42*  CREATININE 1.37*  CALCIUM 9.8   GFR: Estimated Creatinine Clearance: 30.8 mL/min (by C-G formula based on SCr of 1.37 mg/dL (H)). Liver Function Tests: No results for input(s): AST, ALT, ALKPHOS, BILITOT, PROT, ALBUMIN in the last 168 hours. No results for input(s): LIPASE, AMYLASE in the last 168 hours. No results for input(s): AMMONIA in the last 168 hours. Coagulation Profile: No results for input(s): INR, PROTIME in the last 168 hours.  Cardiac Enzymes: No results for input(s): CKTOTAL, CKMB, CKMBINDEX, TROPONINI in the last 168 hours. BNP (last 3 results) No results for input(s): PROBNP in the last 8760 hours. HbA1C: No results for input(s): HGBA1C in the last 72 hours. CBG: No results for input(s): GLUCAP in the last 168 hours. Lipid Profile: No results for input(s): CHOL, HDL, LDLCALC, TRIG, CHOLHDL, LDLDIRECT in the last 72 hours. Thyroid Function Tests: No results for input(s): TSH, T4TOTAL, FREET4, T3FREE, THYROIDAB in the last 72 hours. Anemia Panel: No results for input(s): VITAMINB12, FOLATE, FERRITIN, TIBC, IRON, RETICCTPCT in the last 72 hours. Urine analysis:    Component Value Date/Time   COLORURINE STRAW (A)  06/13/2016 1720   APPEARANCEUR CLEAR (A) 06/13/2016 1720   APPEARANCEUR Clear 12/31/2014 1822   LABSPEC 1.012 06/13/2016 1720   LABSPEC 1.009 12/31/2014 1822   PHURINE 5.0 06/13/2016 1720   GLUCOSEU 150 (A) 06/13/2016 1720   GLUCOSEU >=500 12/31/2014 1822   HGBUR NEGATIVE 06/13/2016 1720   BILIRUBINUR NEGATIVE 06/13/2016 1720   BILIRUBINUR Negative 12/31/2014 1822   KETONESUR NEGATIVE 06/13/2016 1720   PROTEINUR NEGATIVE 06/13/2016 1720   NITRITE NEGATIVE 06/13/2016 1720   LEUKOCYTESUR 2+ (A) 06/13/2016 1720   LEUKOCYTESUR Negative 12/31/2014 1822   Sepsis Labs: @LABRCNTIP (procalcitonin:4,lacticidven:4) )No results found for this or any previous visit (from the past 240 hour(s)).   Radiological Exams on Admission: Dg Chest 2 View  Result Date: 09/10/2016 CLINICAL DATA:  2 minutes episode unresponsive while cooking dinner tonight. Similar symptoms previously. History of hypertension, diabetes, stroke. EXAM: CHEST  2 VIEW COMPARISON:  CT abdomen and pelvis July 19, 2016 FINDINGS: Cardiomediastinal silhouette is normal. Mildly calcified aortic knob . No pleural effusions or focal consolidations. Bibasilar strandy densities. Trachea projects midline and there is no pneumothorax. Soft tissue planes and included osseous structures are non-suspicious. Severe degenerative change of the upper lumbar spine. Thoracolumbar levoscoliosis. IMPRESSION: Mild bibasilar atelectasis. Electronically Signed   By: Elon Alas M.D.   On: 09/10/2016 21:07    EKG: Normal sinus rhythm at 94 bpm with normal axis and nonspecific ST-T wave changes.   Assessment/Plan Active Problems:   Syncope    This is a 76 y.o. female with a history of HTN, HLD, intracranial hemorrhage secondary to warfarin, chronic kidney disease, DM, carotid artery stenosis now being admitted with:  1. Syncope, unclear etiology, possibly secondary to dehydration however Differential diagnosis includes orthostatic hypotension,  vagal response, carotid artery stenosis, arrhythmia -Admit to inpatient with telemetry monitoring -Trend troponins, check lipids and TSH -Check echo and carotid Doppler -Check orthostatics -Check head CT given history of intracranial hemorrhage  Possible UTI, asymptomatic. Follow up urine culture.  2. AKI dehydration -IV fluid hydration and recheck BMP in a.m.  3. DM -Cover with regular insulin sliding scale coverage  4. HTN -Continue benazepril  5. HLD -Continue Lipitor and fenofibrate  6. History of GERD -Continue Protonix  7. History of carotid artery stenosis -Continue aspirin -Check carotid  Admission status: Observation, telemetry IV Fluids: Normal saline Diet/Nutrition: HH, CC Consults called: None  DVT Px: Lovenox, SCDs and early ambulation. Code Status: Full Code  Disposition Plan: To home in less than 24 hours   All the records are reviewed and case discussed with ED provider. Management plans discussed with the patient and/or family who express understanding and agree with plan of care.  Laurie Harris D.O. on 09/10/2016 at 9:31 PM Between 7am to 6pm - Pager - [phone] After 6pm go to [email or web address] - [place]  [facility] Office [phone] CC: Primary care physician; Tommi Rumps, MD   09/10/2016, 9:31 PM

## 2016-09-10 NOTE — ED Provider Notes (Signed)
[facility] Emergency Department Provider Note  ____________________________________________  Time seen: Approximately 8:11 PM  I have reviewed the triage vital signs and the nursing notes.   HISTORY  Chief Complaint Loss of Consciousness    HPI Laurie Harris is a 76 y.o. female w/ hx of HTN, HL, CVA, DM, carotid artery occlusion presenting w/ syncope.  The pt reports that 4d ago, she was on the cough and felt generalized weakness which completely resolved with eating dinner.  She had no symptoms over the past two days.  Today she was standing for a long time in the kitchen when she had acute onset of generalized weakness, lightheadedness.  She sat down, and then had a brief period of unresponsiveness and decreased muscle tone.  She had a few seconds of confusion when she regained consciousness.  Did not fall.  Reports recent decrease in Benazapril dosing, but no other medications changes.  No associated chest pain, shortness of breath, palpitations, numbness tingling or weakness, visual changes, speech changes. No recent trauma. No recent illness.   Past Medical History:  Diagnosis Date  . Carotid artery occlusion   . Chicken pox   . Diabetes mellitus without complication ([place])   . Dyspnea   . Hyperlipidemia   . Hypertension   . Renal insufficiency   . Stroke [facility]) 2016    Patient Active Problem List   Diagnosis Date Noted  . Appendicitis 08/07/2016  . Carotid artery disease ([place]) 06/19/2016  . CKD (chronic kidney disease) stage 3, GFR 30-59 ml/min 05/10/2016  . Screening for breast cancer 02/06/2016  . Diabetes mellitus type 2, controlled, with complications ([place]) [id number]  . Benign essential HTN 03/16/2015  . Hyperlipidemia 03/16/2015  . Cerebellar hemorrhage ([place]) 03/16/2015  . Adenomatous polyp of colon 06/04/2014  . Carotid artery occlusion without infarction 08/18/2013    Past Surgical History:  Procedure Laterality Date  . NECK  SURGERY      Current Outpatient Rx  . Order #: [id number] Class: Normal  . Order #: [id number] Class: Historical Med  . Order #: [id number] Class: Normal  . Order #: [id number] Class: No Print  . Order #: [id number] Class: Historical Med  . Order #: [id number] Class: Historical Med  . Order #: [id number] Class: Historical Med  . Order #: [id number] Class: Normal  . Order #: [id number] Class: Historical Med  . Order #: [id number] Class: Normal  . Order #: [id number] Class: Historical Med  . Order #: [id number] Class: Historical Med    Allergies Patient has no known allergies.  Family History  Problem Relation Age of Onset  . Diabetes Mother   . Cancer Father     colon  . Breast cancer Neg Hx     Social History Social History  Substance Use Topics  . Smoking status: Never Smoker  . Smokeless tobacco: Never Used  . Alcohol use No    Review of Systems Constitutional: No fever/chills.Positive lightheadedness and syncope. Eyes: No visual changes. No blurred or double vision. Patient has poor eyesight at baseline. ENT: No sore throat. No congestion or rhinorrhea. Cardiovascular: Denies chest pain. Denies palpitations. Respiratory: Denies shortness of breath.  No cough. Gastrointestinal: No abdominal pain.  No nausea, no vomiting.  No diarrhea.  No constipation. Genitourinary: Negative for dysuria. Musculoskeletal: Negative for back pain. Skin: Negative for rash. Neurological: Negative for headaches. No focal numbness, tingling or weakness. No visual or speech changes.  10-point ROS otherwise negative.  ____________________________________________   PHYSICAL EXAM:  VITAL SIGNS: ED Triage Vitals  Enc Vitals Group  BP 09/10/16 1934 (!) 154/66     Pulse Rate 09/10/16 1934 83     Resp 09/10/16 1934 20     Temp 09/10/16 1934 98.3 F (36.8 C)     Temp Source 09/10/16 1934 Oral     SpO2 09/10/16 1934 96 %     Weight 09/10/16 1928 125 lb (56.7 kg)     Height 09/10/16 1928 5\' 6"  (1.676  m)     Head Circumference --      Peak Flow --      Pain Score --      Pain Loc --      Pain Edu? --      Excl. in [place]? --     Constitutional: Alert and oriented. Well appearing and in no acute distress. Answers questions appropriately. Eyes: Conjunctivae are normal.  EOMI. No scleral icterus. Head: Atraumatic.No raccoon eyes or Battle sign. Nose: No congestion/rhinnorhea. Mouth/Throat: Mucous membranes are mildly dry.  Neck: No stridor.  Supple.  No JVD. No meningismus. Cardiovascular: Normal rate, regular rhythm. No murmurs, rubs or gallops.  Respiratory: Normal respiratory effort.  No accessory muscle use or retractions. Lungs CTAB.  No wheezes, rales or ronchi. Gastrointestinal: Soft, nontender and nondistended.  No guarding or rebound.  No peritoneal signs. Musculoskeletal: No LE edema. No ttp in the calves or palpable cords.  Negative Homan's sign. Neurologic:  A&Ox3.  Speech is clear.  Face and smile are symmetric.  EOMI.  PERRLA. Moves all extremities well. Skin:  Skin is warm, dry and intact. No rash noted. Psychiatric: Mood and affect are normal. Speech and behavior are normal.  Normal judgement.  ____________________________________________   LABS (all labs ordered are listed, but only abnormal results are displayed)  Labs Reviewed  BASIC METABOLIC PANEL - Abnormal; Notable for the following:       Result Value   Glucose, Bld 161 (*)    BUN 42 (*)    Creatinine, Ser 1.37 (*)    GFR calc non Af Amer 36 (*)    GFR calc Af Amer 42 (*)    All other components within normal limits  CBC  URINALYSIS, COMPLETE (UACMP) WITH MICROSCOPIC  TROPONIN I  CBG MONITORING, ED   ____________________________________________  EKG  ED ECG REPORT I, Eula Listen, the attending physician, personally viewed and interpreted this ECG.   Date: 09/10/2016  EKG Time: 1931  Rate: 94  Rhythm: normal sinus rhythm  Axis: Normal  Intervals:none  ST&T Change: Nonspecific T-wave  inversion in V1. No ST elevation.  ____________________________________________  RADIOLOGY  No results found.  ____________________________________________   PROCEDURES  Procedure(s) performed: None  Procedures  Critical Care performed: No ____________________________________________   INITIAL IMPRESSION / ASSESSMENT AND PLAN / ED COURSE  Pertinent labs & imaging results that were available during my care of the patient were reviewed by me and considered in my medical decision making (see chart for details).  76 y.o. female presenting with a syncopal episode. At this time, the patient has reassuring vital signs and no focal cardiopulmonary or neurologic abnormalities per just mildly dry mucous membranes and a creatinine which is minimally elevated with an elevated BUN; I wonder if she might have some hypovolemia or dehydration. Will get orthostatics to evaluate for this but she is not anemic. I'll get a troponin, chest x-ray, and anticipate admission to the hospital for further evaluation and treatment.  ----------------------------------------- 8:59 PM on 09/10/2016 -----------------------------------------  The patient's EKG is reassuring and there is no sign  of arrhythmia. Her laboratory studies do show some possible dehydration with an elevated creatinine and BUN. His nose admitted to the hospitalists for further evaluation and treatment. ____________________________________________  FINAL CLINICAL IMPRESSION(S) / ED DIAGNOSES  Final diagnoses:  Syncope, unspecified syncope type  Acute renal insufficiency    Clinical Course       NEW MEDICATIONS STARTED DURING THIS VISIT:  New Prescriptions   No medications on file      Eula Listen, MD 09/10/16 2100

## 2016-09-11 ENCOUNTER — Observation Stay: Payer: Medicare Other

## 2016-09-11 ENCOUNTER — Telehealth: Payer: Self-pay | Admitting: Family Medicine

## 2016-09-11 DIAGNOSIS — R55 Syncope and collapse: Secondary | ICD-10-CM | POA: Diagnosis not present

## 2016-09-11 DIAGNOSIS — N179 Acute kidney failure, unspecified: Secondary | ICD-10-CM | POA: Diagnosis not present

## 2016-09-11 DIAGNOSIS — E860 Dehydration: Secondary | ICD-10-CM | POA: Diagnosis not present

## 2016-09-11 DIAGNOSIS — I10 Essential (primary) hypertension: Secondary | ICD-10-CM | POA: Diagnosis not present

## 2016-09-11 DIAGNOSIS — I6523 Occlusion and stenosis of bilateral carotid arteries: Secondary | ICD-10-CM | POA: Diagnosis not present

## 2016-09-11 LAB — BASIC METABOLIC PANEL
Anion gap: 7 (ref 5–15)
BUN: 31 mg/dL — AB (ref 6–20)
CALCIUM: 8.9 mg/dL (ref 8.9–10.3)
CO2: 23 mmol/L (ref 22–32)
CREATININE: 0.96 mg/dL (ref 0.44–1.00)
Chloride: 112 mmol/L — ABNORMAL HIGH (ref 101–111)
GFR calc Af Amer: >60 mL/min (ref >60)
GFR, EST NON AFRICAN AMERICAN: 56 mL/min — AB (ref >60)
GLUCOSE: 92 mg/dL (ref 65–99)
Potassium: 3.7 mmol/L (ref 3.5–5.1)
SODIUM: 142 mmol/L (ref 135–145)

## 2016-09-11 LAB — URINALYSIS, COMPLETE (UACMP) WITH MICROSCOPIC
BILIRUBIN URINE: NEGATIVE
Bacteria, UA: NONE SEEN
GLUCOSE, UA: NEGATIVE mg/dL
HGB URINE DIPSTICK: NEGATIVE
KETONES UR: NEGATIVE mg/dL
NITRITE: NEGATIVE
PROTEIN: NEGATIVE mg/dL
Specific Gravity, Urine: 1.009 (ref 1.005–1.030)
pH: 5 (ref 5.0–8.0)

## 2016-09-11 LAB — PHOSPHORUS: PHOSPHORUS: 3.3 mg/dL (ref 2.5–4.6)

## 2016-09-11 LAB — LIPID PANEL
Cholesterol: 122 mg/dL (ref 0–200)
HDL: 27 mg/dL — ABNORMAL LOW (ref >40)
LDL CALC: 64 mg/dL (ref 0–99)
Total CHOL/HDL Ratio: 4.5 RATIO
Triglycerides: 154 mg/dL — ABNORMAL HIGH (ref <150)
VLDL: 31 mg/dL (ref 0–40)

## 2016-09-11 LAB — CBC
HCT: 34.2 % — ABNORMAL LOW (ref 35.0–47.0)
Hemoglobin: 11.8 g/dL — ABNORMAL LOW (ref 12.0–16.0)
MCH: 33.7 pg (ref 26.0–34.0)
MCHC: 34.4 g/dL (ref 32.0–36.0)
MCV: 98 fL (ref 80.0–100.0)
PLATELETS: 175 10*3/uL (ref 150–440)
RBC: 3.49 MIL/uL — AB (ref 3.80–5.20)
RDW: 14.9 % — AB (ref 11.5–14.5)
WBC: 6.4 10*3/uL (ref 3.6–11.0)

## 2016-09-11 LAB — MAGNESIUM: MAGNESIUM: 1.6 mg/dL — AB (ref 1.7–2.4)

## 2016-09-11 LAB — TROPONIN I: Troponin I: <0.03 ng/mL (ref <0.03)

## 2016-09-11 LAB — TSH: TSH: 0.953 u[IU]/mL (ref 0.350–4.500)

## 2016-09-11 MED ORDER — HYDRALAZINE HCL 20 MG/ML IJ SOLN: 20.0000 mg | Freq: Once | INTRAMUSCULAR | Status: DC

## 2016-09-11 MED ORDER — OMEGA-3-ACID ETHYL ESTERS 1 G PO CAPS
1.0000 g | ORAL_CAPSULE | Freq: Every day | ORAL | Status: DC
  Administered 2016-09-11: 1 g via ORAL
  Filled 2016-09-11: qty 1

## 2016-09-11 MED ORDER — ACETAMINOPHEN 650 MG RE SUPP: 650.0000 mg | Freq: Four times a day (QID) | RECTAL | Status: DC | PRN

## 2016-09-11 MED ORDER — BISACODYL 5 MG PO TBEC: 5.0000 mg | DELAYED_RELEASE_TABLET | Freq: Every day | ORAL | Status: DC | PRN

## 2016-09-11 MED ORDER — HYDRALAZINE HCL 20 MG/ML IJ SOLN
10.0000 mg | Freq: Once | INTRAMUSCULAR | Status: DC
  Filled 2016-09-11: qty 1

## 2016-09-11 MED ORDER — SENNOSIDES-DOCUSATE SODIUM 8.6-50 MG PO TABS: 1.0000 | ORAL_TABLET | Freq: Every evening | ORAL | Status: DC | PRN

## 2016-09-11 MED ORDER — FENOFIBRATE 160 MG PO TABS: 160.0000 mg | ORAL_TABLET | Freq: Every day | ORAL | Status: DC

## 2016-09-11 MED ORDER — VITAMIN D 1000 UNITS PO TABS
2000.0000 [IU] | ORAL_TABLET | Freq: Every day | ORAL | Status: DC
  Administered 2016-09-11: 2000 [IU] via ORAL
  Filled 2016-09-11: qty 2

## 2016-09-11 MED ORDER — INSULIN ASPART 100 UNIT/ML ~~LOC~~ SOLN: 0.0000 [IU] | Freq: Three times a day (TID) | SUBCUTANEOUS | Status: DC

## 2016-09-11 MED ORDER — ADULT MULTIVITAMIN W/MINERALS CH
1.0000 | ORAL_TABLET | Freq: Every day | ORAL | Status: DC
  Administered 2016-09-11: 1 via ORAL
  Filled 2016-09-11: qty 1

## 2016-09-11 MED ORDER — ALUM & MAG HYDROXIDE-SIMETH 200-200-20 MG/5ML PO SUSP: 30.0000 mL | Freq: Four times a day (QID) | ORAL | Status: DC | PRN

## 2016-09-11 MED ORDER — SODIUM CHLORIDE 0.9% FLUSH
3.0000 mL | Freq: Two times a day (BID) | INTRAVENOUS | Status: DC
  Administered 2016-09-11: 3 mL via INTRAVENOUS

## 2016-09-11 MED ORDER — SODIUM CHLORIDE 0.9 % IV SOLN
INTRAVENOUS | Status: DC
  Administered 2016-09-11 (×2): via INTRAVENOUS

## 2016-09-11 MED ORDER — ENOXAPARIN SODIUM 40 MG/0.4ML ~~LOC~~ SOLN: 40.0000 mg | SUBCUTANEOUS | Status: DC

## 2016-09-11 MED ORDER — MAGNESIUM CITRATE PO SOLN
1.0000 | Freq: Once | ORAL | Status: DC | PRN
  Filled 2016-09-11: qty 296

## 2016-09-11 MED ORDER — CALCIUM CARBONATE-VITAMIN D 500-200 MG-UNIT PO TABS
1.0000 | ORAL_TABLET | Freq: Two times a day (BID) | ORAL | Status: DC
  Administered 2016-09-11: 10:00:00 1 via ORAL
  Filled 2016-09-11: qty 1

## 2016-09-11 MED ORDER — HYDRALAZINE HCL 20 MG/ML IJ SOLN
10.0000 mg | Freq: Once | INTRAMUSCULAR | Status: AC
  Administered 2016-09-11: 10 mg via INTRAVENOUS
  Filled 2016-09-11: qty 1

## 2016-09-11 MED ORDER — ONDANSETRON HCL 4 MG/2ML IJ SOLN: 4.0000 mg | Freq: Four times a day (QID) | INTRAMUSCULAR | Status: DC | PRN

## 2016-09-11 MED ORDER — ACETAMINOPHEN 325 MG PO TABS: 650.0000 mg | ORAL_TABLET | Freq: Four times a day (QID) | ORAL | Status: DC | PRN

## 2016-09-11 MED ORDER — ZOLPIDEM TARTRATE 5 MG PO TABS: 5.0000 mg | ORAL_TABLET | Freq: Every evening | ORAL | Status: DC | PRN

## 2016-09-11 MED ORDER — PANTOPRAZOLE SODIUM 40 MG PO TBEC
40.0000 mg | DELAYED_RELEASE_TABLET | Freq: Every day | ORAL | Status: DC
Start: 2016-09-11 — End: 2016-09-11
  Administered 2016-09-11: 40 mg via ORAL
  Filled 2016-09-11: qty 1

## 2016-09-11 MED ORDER — INSULIN ASPART 100 UNIT/ML ~~LOC~~ SOLN: 0.0000 [IU] | Freq: Every day | SUBCUTANEOUS | Status: DC

## 2016-09-11 MED ORDER — ATORVASTATIN CALCIUM 20 MG PO TABS
80.0000 mg | ORAL_TABLET | Freq: Every day | ORAL | Status: DC
  Filled 2016-09-11: qty 4

## 2016-09-11 MED ORDER — ASPIRIN EC 81 MG PO TBEC
81.0000 mg | DELAYED_RELEASE_TABLET | Freq: Every day | ORAL | Status: DC
  Administered 2016-09-11: 81 mg via ORAL
  Filled 2016-09-11: qty 1

## 2016-09-11 MED ORDER — BENAZEPRIL HCL 5 MG PO TABS
2.5000 mg | ORAL_TABLET | Freq: Every day | ORAL | Status: DC
  Filled 2016-09-11: qty 1

## 2016-09-11 MED ORDER — ONDANSETRON HCL 4 MG PO TABS: 4.0000 mg | ORAL_TABLET | Freq: Four times a day (QID) | ORAL | Status: DC | PRN

## 2016-09-11 MED ORDER — HYDROCODONE-ACETAMINOPHEN 5-325 MG PO TABS
1.0000 | ORAL_TABLET | ORAL | Status: DC | PRN
Start: 2016-09-11 — End: 2016-09-11

## 2016-09-11 NOTE — Evaluation (Signed)
Physical Therapy Evaluation Patient Details Name: Laurie Harris MRN: [id number] DOB: 09-17-1940 Today's Date: 09/11/2016   History of Present Illness  76 y.o. female with a known history of HTN, HLD, intracranial hemorrhage secondary to warfarin, chronic kidney disease, DM, carotid artery stenosis.  She was standing at the stove cooking dinner, she felt lightheaded, dizzy and generally weak. She sat down to collect herself and had a short period of loss of consciousness.   Clinical Impression  Pt generally did very well with PT and is at/near her post CVA baseline.  She was able to don/tie shoes while sitting at EOB, ambulate ~200 ft, negotiate up/down steps and generally was safe and confident with most acts.  Pt and husband feel safe going home and pt did not have any dizziness, etc t/o this PT exam.     Follow Up Recommendations No PT follow up    Equipment Recommendations       Recommendations for Other Services       Precautions / Restrictions Precautions Precautions: Fall Restrictions Weight Bearing Restrictions: No      Mobility  Bed Mobility Overal bed mobility: Independent             General bed mobility comments: Pt able to get to EOB and maintain balance while donning shoes well and with good confidence  Transfers Overall transfer level: Independent Equipment used: None             General transfer comment: Pt is able to rise to standing w/o assist and shows good balance/confidence  Ambulation/Gait Ambulation/Gait assistance: Min guard Ambulation Distance (Feet): 200 Feet Assistive device: None       General Gait Details: Pt reports that she fatigues quickly if she does too much, but was able to go to/from the stairwell w/o issue and though she had 2 small stagger steps she had no near LOBs or serious safety concerns.    Stairs Stairs: Yes Stairs assistance: Min guard Stair Management: One rail Right Number of Stairs: 3 General stair  comments: Pt able to negotiate up/down steps with step-to strategy w/o issue  Wheelchair Mobility    Modified Rankin (Stroke Patients Only)       Balance Overall balance assessment: Modified Independent                                           Pertinent Vitals/Pain Pain Assessment: No/denies pain    Home Living Family/patient expects to be discharged to:: Private residence Living Arrangements: Spouse/significant other   Type of Home: House                Prior Function Level of Independence: Independent         Comments: Pt is able to manage w/o AD, recovered well from CVA last year.     Hand Dominance        Extremity/Trunk Assessment   Upper Extremity Assessment Upper Extremity Assessment: Overall WFL for tasks assessed    Lower Extremity Assessment Lower Extremity Assessment: Overall WFL for tasks assessed       Communication   Communication: No difficulties  Cognition Arousal/Alertness: Awake/alert Behavior During Therapy: WFL for tasks assessed/performed Overall Cognitive Status: Within Functional Limits for tasks assessed                      General Comments  Exercises     Assessment/Plan    PT Assessment Patent does not need any further PT services  PT Problem List            PT Treatment Interventions      PT Goals (Current goals can be found in the Care Plan section)  Acute Rehab PT Goals Patient Stated Goal: go home today PT Goal Formulation: With patient    Frequency     Barriers to discharge        Co-evaluation               End of Session Equipment Utilized During Treatment: Gait belt Activity Tolerance: Patient tolerated treatment well Patient left: with bed alarm set;with call bell/phone within reach;with family/visitor present      Functional Assessment Tool Used: clinical judgement Functional Limitation: Mobility: Walking and moving around Mobility: Walking and  Moving Around Current Status [id number]): At least 1 percent but less than 20 percent impaired, limited or restricted Mobility: Walking and Moving Around Goal Status [phone]): At least 1 percent but less than 20 percent impaired, limited or restricted Mobility: Walking and Moving Around Discharge Status [phone]): At least 1 percent but less than 20 percent impaired, limited or restricted    Time: [id number] PT Time Calculation (min) (ACUTE ONLY): 22 min   Charges:   PT Evaluation $PT Eval Low Complexity: 1 Procedure     PT G Codes:   PT G-Codes **NOT FOR INPATIENT CLASS** Functional Assessment Tool Used: clinical judgement Functional Limitation: Mobility: Walking and moving around Mobility: Walking and Moving Around Current Status [id number]): At least 1 percent but less than 20 percent impaired, limited or restricted Mobility: Walking and Moving Around Goal Status [phone]): At least 1 percent but less than 20 percent impaired, limited or restricted Mobility: Walking and Moving Around Discharge Status [phone]): At least 1 percent but less than 20 percent impaired, limited or restricted    Kreg Shropshire, DPT 09/11/2016, 1:58 PM

## 2016-09-11 NOTE — Discharge Instructions (Signed)
Needs echo with pcp in next 2-3 days  Admitted for syncope,

## 2016-09-11 NOTE — Telephone Encounter (Signed)
Agree. Given recent syncopal episode patient should have this done inpatient. If she absolutely disagrees with this we can see her tomorrow in the office.

## 2016-09-11 NOTE — Telephone Encounter (Signed)
Tesha from [facility] called and wanted to know if Dr. Caryl Bis could order an ASAP or STAT for echocardiogram for pt. She was in hospital for Syncope. Please advise, thank you!  Call Josh (charge nurse) # [phone]

## 2016-09-11 NOTE — Telephone Encounter (Signed)
Called charge nurse, he states patient is wanting to leave instead of waiting for tech to arrive around 7pm. He states their tech has been out and the patient is ready to be discharged. He states the patient is being difficult and does not want to stay. Per Dr.Sonnenberg I informed the charge nurse that he really recommends that the patient stays and has it done at [facility]. Charge nurse states he will try to have her stay. I informed him that if she absolutely will not stay then we will order it but patient will need a follow up appointment. I have blocked an opening for 09/12/16 at 0830

## 2016-09-11 NOTE — Discharge Summary (Signed)
Laurie Harris, is a 76 y.o. female  DOB Jun 16, 1941  MRN [id number].  Admission date:  09/10/2016  Admitting Physician  Harvie Bridge, DO  Discharge Date:  09/11/2016   Primary MD  Tommi Rumps, MD  Recommendations for primary care physician for things to follow:    follow  With Primary doctor in 2-3 days.   Admission Diagnosis  Syncope [R55] Acute renal insufficiency [N28.9] Syncope, unspecified syncope type [R55]   Discharge Diagnosis  Syncope [R55] Acute renal insufficiency [N28.9] Syncope, unspecified syncope type [R55]    Active Problems:   Syncope      Past Medical History:  Diagnosis Date  . Carotid artery occlusion   . Chicken pox   . Diabetes mellitus without complication ([place])   . Dyspnea   . Hyperlipidemia   . Hypertension   . Renal insufficiency   . Stroke [facility]) 2016    Past Surgical History:  Procedure Laterality Date  . NECK SURGERY         History of present illness and  Hospital Course:     Kindly see H&P for history of present illness and admission details, please review complete Labs, Consult reports and Test reports for all details in brief  HPI  from the history and physical done on the day of admission 76 year old female patient admitted for syncope, dehydration, acute kidney injury.   Hospital Course   #1 syncope likely secondary to dehydration: Improved with IV hydration. Patient had a ultrasound of carotids which showed bilateral carotid  Plaque  right more on the right side than the left, MRA of the carotids are done in our [facility] in 2016, I reviewed the results and the results essentially look similar to 2016. Echocardiogram is not done because of staffing issues today. Patient did not have any arrhythmias, no shortness of breath, hemodynamically stable.  can have  echocardiogram with primary doctor, patient has appointment tomorrow with primary doctor. She says that she feels better today and wants to go home. 2. ATN likely prerenal with BUN 42, creatinine 1.37 on admission improved with IV hydration to BUN 31 and creatinine 0.96. Patient says that she has some kidney issues and follows up with Dr.Munsoor Holley Raring, Advised to give the appointment with him.  #3 history of hemorrhagic stroke before, patient is not on any anticoagulation. She takes only aspirin 81 mg, statins, TriCor ,continue them.  4. history of diabetes mellitus type 2: Patient takes 75/25 mix dose insulin 15 units twice a day:  Discharge Condition:stable   Follow UP  Follow-up Information    Tommi Rumps, MD. Go in 1 day.   Specialty:  Family Medicine Contact information: [place] [address] [phone]             Discharge Instructions  and  Discharge Medications      Allergies as of 09/11/2016   No Known Allergies     Medication List    TAKE these medications   alendronate 70 MG tablet Commonly known as:  FOSAMAX TAKE 1 TABLET EVERY 7 DAYS WITH A FULL GLASS OF WATER ON AN EMPTY STOMACH DO NOT LIE DOWN FOR AT LEAST 30 MIN   aspirin EC 81 MG tablet Take 81 mg by mouth daily.   atorvastatin 80 MG tablet Commonly known as:  LIPITOR TAKE ONE TABLET EVERY DAY   benazepril 5 MG tablet Commonly known as:  LOTENSIN Take 1 tablet (5 mg total) by mouth daily. What changed:  how much to take   calcium-vitamin D 500-200 MG-UNIT tablet Commonly known as:  OSCAL WITH D Take 1 tablet by mouth 2 (two) times daily.   cholecalciferol 1000 units tablet Commonly known as:  VITAMIN D Take 2,000 Units by mouth daily.   fenofibrate 160 MG tablet Take 160 mg by mouth at bedtime.   Fish Oil 1200 MG Caps Take 1,200 mg by mouth daily.   HUMALOG MIX 75/25 KWIKPEN (75-25) 100 UNIT/ML Kwikpen Generic drug:  Insulin Lispro Prot &  Lispro INJECT 15 UNITS TWICE DAILY AS DIRECTED What changed:  See the new instructions.   multivitamin with minerals Tabs tablet Take 1 tablet by mouth daily.   NOVOFINE PLUS 32G X 4 MM Misc Generic drug:  Insulin Pen Needle USE TWICE DAILY AS DIRECTED   PROTONIX 40 MG tablet Generic drug:  pantoprazole Take 40 mg by mouth daily.         Diet and Activity recommendation: See Discharge Instructions above   Consults obtained - none   Major procedures and Radiology Reports - PLEASE review detailed and final reports for all details, in brief -      X-ray Chest Pa And Lateral  Result Date: 09/11/2016 CLINICAL DATA:  Syncopal episode yesterday. EXAM: CHEST  2 VIEW COMPARISON:  PA chest x-ray of September 10, 2016 FINDINGS: Lungs are well-expanded and clear. The heart and pulmonary vascularity are normal. There is calcification in the aortic arch. The mediastinum is normal in width. The trachea is midline. There is moderate levocurvature centered in the lower thoracic spine. IMPRESSION: There is no acute cardiopulmonary abnormality. Thoracic aortic atherosclerosis. Electronically Signed   By: David  Martinique M.D.   On: 09/11/2016 08:34   Dg Chest 2 View  Result Date: 09/10/2016 CLINICAL DATA:  2 minutes episode unresponsive while cooking dinner tonight. Similar symptoms previously. History of hypertension, diabetes, stroke. EXAM: CHEST  2 VIEW COMPARISON:  CT abdomen and pelvis July 19, 2016 FINDINGS: Cardiomediastinal silhouette is normal. Mildly calcified aortic knob . No pleural effusions or focal consolidations. Bibasilar strandy densities. Trachea projects midline and there is no pneumothorax. Soft tissue planes and included osseous structures are non-suspicious. Severe degenerative change of the upper lumbar spine. Thoracolumbar levoscoliosis. IMPRESSION: Mild bibasilar atelectasis. Electronically Signed   By: Elon Alas M.D.   On: 09/10/2016 21:07   Ct Head Wo  Contrast  Result Date: 09/10/2016 CLINICAL DATA:  Initial evaluation for acute syncope. History of prior stroke. EXAM: CT HEAD WITHOUT CONTRAST TECHNIQUE: Contiguous axial images were obtained from the base of the skull through the vertex without intravenous contrast. COMPARISON:  Prior CT from 12/31/2014. FINDINGS: Brain: Generalized age-related cerebral atrophy with mild to moderate chronic microvascular ischemic disease. No acute intracranial hemorrhage.4 no evidence for acute large vessel territory infarct. No mass lesion, midline shift, or mass effect. No hydrocephalus. No extra-axial fluid collection. Vascular: No hyperdense vessel. Scattered atherosclerotic plaque present within the carotid siphons and distal vertebral arteries. Skull: Scalp soft tissues demonstrate no acute abnormality. Calvarium intact. Sinuses/Orbits: Globes and orbital soft tissues within normal limits. Visualized paranasal sinuses and mastoids are clear. IMPRESSION: 1. No acute intracranial process. 2. Mild age-related cerebral atrophy with chronic microvascular ischemic disease. Intracranial atherosclerosis. Electronically Signed   By: Jeannine Boga M.D.   On: 09/10/2016 23:16   US Carotid Bilateral  Result Date: 09/11/2016 CLINICAL DATA:  Syncope. EXAM: BILATERAL CAROTID DUPLEX ULTRASOUND TECHNIQUE: Pearline Cables scale imaging, color Doppler and duplex ultrasound were performed of bilateral carotid and vertebral  arteries in the neck. COMPARISON:  CT 09/11/2015. FINDINGS: Criteria: Quantification of carotid stenosis is based on velocity parameters that correlate the residual internal carotid diameter with NASCET-based stenosis levels, using the diameter of the distal internal carotid lumen as the denominator for stenosis measurement. The following velocity measurements were obtained: RIGHT ICA:   204/35 cm/sec CCA:  [id number] cm/sec SYSTOLIC ICA/CCA RATIO:  2.7 DIASTOLIC ICA/CCA RATIO:  1.9 ECA:  428 cm/sec LEFT ICA:  117/30 cm/sec CCA:   [id number] cm/sec SYSTOLIC ICA/CCA RATIO:  1.4 DIASTOLIC ICA/CCA RATIO:  2.1 ECA:  127 cm/sec RIGHT CAROTID ARTERY: Moderate to severe right carotid bifurcation atherosclerotic vascular disease. RIGHT VERTEBRAL ARTERY:  Patent with antegrade flow. LEFT CAROTID ARTERY: Mild to moderate left carotid bifurcates atherosclerotic vascular disease. LEFT VERTEBRAL ARTERY:  Patent antegrade flow. IMPRESSION: 1. Moderate severe right carotid bifurcation atherosclerotic vascular disease. Degree of stenosis 50-69%. 2. Mild moderate left carotid bifurcation atherosclerotic vascular disease. Degree of stenosis less than 50%. 3. Vertebral arteries are patent with antegrade flow. Electronically Signed   By: Marcello Moores  Register   On: 09/11/2016 09:00    Micro Results     No results found for this or any previous visit (from the past 240 hour(s)).     Today   Subjective:   Payten [place] today has no headache,no chest abdominal pain,no new weakness tingling or numbness, feels much better wants to go home today.   Objective:   Blood pressure (!) 154/88, pulse 80, temperature 97.6 F (36.4 C), temperature source Oral, resp. rate 18, height 5\' 6"  (1.676 m), weight 56.7 kg (125 lb), SpO2 98 %.   Intake/Output Summary (Last 24 hours) at 09/11/16 1744 Last data filed at 09/11/16 1551  Gross per 24 hour  Intake             1120 ml  Output             1400 ml  Net             -280 ml    Exam Awake Alert, Oriented x 3, No new F.N deficits, Normal affect [place].AT,PERRAL Supple Neck,No JVD, No cervical lymphadenopathy appriciated.  Symmetrical Chest wall movement, Good air movement bilaterally, CTAB RRR,No Gallops,Rubs or new Murmurs, No Parasternal Heave +ve B.Sounds, Abd Soft, Non tender, No organomegaly appriciated, No rebound -guarding or rigidity. No Cyanosis, Clubbing or edema, No new Rash or bruise  Data Review   CBC w Diff: Lab Results  Component Value Date   WBC 6.4 09/11/2016   HGB 11.8 (L) 09/11/2016    HGB 12.0 12/31/2014   HCT 34.2 (L) 09/11/2016   HCT 36.9 12/31/2014   PLT 175 09/11/2016   PLT 246 12/31/2014   LYMPHOPCT 49.0 (H) 04/20/2015   MONOPCT 6.7 04/20/2015   EOSPCT 2.8 04/20/2015   BASOPCT 0.5 04/20/2015    CMP: Lab Results  Component Value Date   NA 142 09/11/2016   NA 141 12/31/2014   K 3.7 09/11/2016   K 3.4 (L) 12/31/2014   CL 112 (H) 09/11/2016   CL 110 12/31/2014   CO2 23 09/11/2016   CO2 22 12/31/2014   BUN 31 (H) 09/11/2016   BUN 32 (H) 12/31/2014   CREATININE 0.96 09/11/2016   CREATININE 1.02 (H) 12/31/2014   PROT 7.0 06/13/2016   PROT 6.3 (L) 12/31/2014   ALBUMIN 3.0 (L) 06/13/2016   ALBUMIN 3.4 (L) 12/31/2014   BILITOT 0.2 (L) 06/13/2016   BILITOT 0.7 12/31/2014   ALKPHOS 73 06/13/2016  ALKPHOS 51 12/31/2014   AST 33 06/13/2016   AST 50 (H) 12/31/2014   ALT 32 06/13/2016   ALT 61 (H) 12/31/2014  .   Total Time in preparing paper work, data evaluation and todays exam - 83 minutes  Ramirez Fullbright M.D on 09/11/2016 at 5:44 PM    Note: This dictation was prepared with Dragon dictation along with smaller phrase technology. Any transcriptional errors that result from this process are unintentional.

## 2016-09-11 NOTE — Progress Notes (Signed)
09/11/2016 5:05 PM  Laurie Harris to be D/C'd Home per MD order.  Discussed prescriptions and follow up appointment.  Explained the need to schedule Echo with pcp because of her insistence to leave this evening. Prescriptions given to patient, medication list explained in detail. Pt verbalized understanding.  Allergies as of 09/11/2016   No Known Allergies     Medication List    TAKE these medications   alendronate 70 MG tablet Commonly known as:  FOSAMAX TAKE 1 TABLET EVERY 7 DAYS WITH A FULL GLASS OF WATER ON AN EMPTY STOMACH DO NOT LIE DOWN FOR AT LEAST 30 MIN   aspirin EC 81 MG tablet Take 81 mg by mouth daily.   atorvastatin 80 MG tablet Commonly known as:  LIPITOR TAKE ONE TABLET EVERY DAY   benazepril 5 MG tablet Commonly known as:  LOTENSIN Take 1 tablet (5 mg total) by mouth daily. What changed:  how much to take   calcium-vitamin D 500-200 MG-UNIT tablet Commonly known as:  OSCAL WITH D Take 1 tablet by mouth 2 (two) times daily.   cholecalciferol 1000 units tablet Commonly known as:  VITAMIN D Take 2,000 Units by mouth daily.   fenofibrate 160 MG tablet Take 160 mg by mouth at bedtime.   Fish Oil 1200 MG Caps Take 1,200 mg by mouth daily.   HUMALOG MIX 75/25 KWIKPEN (75-25) 100 UNIT/ML Kwikpen Generic drug:  Insulin Lispro Prot & Lispro INJECT 15 UNITS TWICE DAILY AS DIRECTED What changed:  See the new instructions.   multivitamin with minerals Tabs tablet Take 1 tablet by mouth daily.   NOVOFINE PLUS 32G X 4 MM Misc Generic drug:  Insulin Pen Needle USE TWICE DAILY AS DIRECTED   PROTONIX 40 MG tablet Generic drug:  pantoprazole Take 40 mg by mouth daily.       Vitals:   09/11/16 0807 09/11/16 1420  BP: (!) 154/61 (!) 168/76  Pulse: 71 80  Resp: 18 18  Temp: 97.6 F (36.4 C)     Skin clean, dry and intact without evidence of skin break down, no evidence of skin tears noted. IV catheter discontinued intact. Site without signs and symptoms  of complications. Dressing and pressure applied. Pt denies pain at this time. No complaints noted.  An After Visit Summary was printed and given to the patient. Patient escorted via [place], and D/C home via private auto.  Dola Argyle

## 2016-09-11 NOTE — Care Management Obs Status (Signed)
[facility] NOTIFICATION   Patient Details  Name: Laurie Harris MRN: [id number] Date of Birth: 10-20-1940   Medicare Observation Status Notification Given:  No (admitted less thand 24 hours)    Beverly Sessions, RN 09/11/2016, 2:35 PM

## 2016-09-12 ENCOUNTER — Telehealth: Payer: Self-pay | Admitting: Family Medicine

## 2016-09-12 LAB — HEMOGLOBIN A1C
Hgb A1c MFr Bld: 6.4 % — ABNORMAL HIGH (ref 4.8–5.6)
Mean Plasma Glucose: 137 mg/dL

## 2016-09-12 NOTE — Telephone Encounter (Signed)
Transition Care Management Follow-up Telephone Call  How have you been since you were released from the hospital? I feel my normal. In hospital felt fatigued and tired.   Do you understand why you were in the hospital? Yes, syncope episode while cooking.   Do you understand the discharge instrcutions? Yes,  Items Reviewed:  Medications reviewed: YES, but upset because she did not receive her insulin while hospitalized.  Allergies reviewed: Yes.  Dietary changes reviewed: Yes  Referrals reviewed: Yes, Patient to discuss ECHO with PCP at follow up.   Functional Questionnaire:   Activities of Daily Living (ADLs):   She states they are independent in the following: Able to dress and bathe self. States they require assistance with the following: Patient husband prepares meals.   Any transportation issues/concerns?: NO        Any patient concerns? Concerned because she is not able to climb stairs more than one step at a time.    Confirmed importance and date/time of follow-up visits scheduled: Yes   Confirmed with patient if condition begins to worsen call PCP or go to the ER.  Patient was given the Call-a-Nurse line [phone]: Yes.

## 2016-09-13 ENCOUNTER — Ambulatory Visit (INDEPENDENT_AMBULATORY_CARE_PROVIDER_SITE_OTHER): Payer: Medicare Other | Admitting: Family Medicine

## 2016-09-13 ENCOUNTER — Encounter: Payer: Self-pay | Admitting: Family Medicine

## 2016-09-13 VITALS — BP 128/68 | HR 67 | Temp 97.7°F | Wt 125.4 lb

## 2016-09-13 DIAGNOSIS — R55 Syncope and collapse: Secondary | ICD-10-CM

## 2016-09-13 DIAGNOSIS — N183 Chronic kidney disease, stage 3 unspecified: Secondary | ICD-10-CM

## 2016-09-13 DIAGNOSIS — E118 Type 2 diabetes mellitus with unspecified complications: Secondary | ICD-10-CM | POA: Diagnosis not present

## 2016-09-13 DIAGNOSIS — I6523 Occlusion and stenosis of bilateral carotid arteries: Secondary | ICD-10-CM | POA: Diagnosis not present

## 2016-09-13 DIAGNOSIS — Z794 Long term (current) use of insulin: Secondary | ICD-10-CM

## 2016-09-13 NOTE — Assessment & Plan Note (Addendum)
Patient with syncopal episode likely related to dehydration and orthostasis given lab work revealing dehydration. Other workup in the hospital unremarkable. Unable to obtain an echo in the hospital and thus we will order this as an outpatient. Discussed staying well-hydrated and advised on at least 64 ounces of water daily. Monitor for recurrence of symptoms. Given return precautions.

## 2016-09-13 NOTE — Assessment & Plan Note (Signed)
Patient with acute on chronic kidney injury possibly from dehydration and contributing to her syncopal episode. Discussed obtaining a repeat BMP today though she opted to hold off on this until she sees her nephrologist next week. I felt this was reasonable. She'll stay well hydrated. Monitor for recurrence of syncopal symptoms.

## 2016-09-13 NOTE — Progress Notes (Signed)
Pre visit review using our clinic review tool, if applicable. No additional management support is needed unless otherwise documented below in the visit note. 

## 2016-09-13 NOTE — Progress Notes (Signed)
Tommi Rumps, MD Phone: [phone]  Laurie Harris is a 76 y.o. female who presents today for hospital follow-up.  Patient hospitalized for syncopal episode. Her husband witnessed this. Notes that she felt lightheaded while at the sink and then sat down. Husband noted her eyes rolled back in her head and she stopped talking. She was out for 2-3 minutes. No seizure like episodes. No incontinence. No fall or head injury. She had a workup in the hospital that revealed likely dehydration as a cause. They were unable to complete an echo. She notes no chest pain or palpitations preceding the event. She's not had any symptoms since discharge. Most recent carotid Doppler prior to hospitalization reviewed. Medications reviewed in full with patient.  PMH: nonsmoker.   ROS see history of present illness  Objective  Physical Exam Vitals:   09/13/16 1113  BP: 128/68  Pulse: 67  Temp: 97.7 F (36.5 C)    BP Readings from Last 3 Encounters:  09/13/16 128/68  09/11/16 (!) 154/88  08/07/16 126/78   Wt Readings from Last 3 Encounters:  09/13/16 125 lb 6.4 oz (56.9 kg)  09/10/16 125 lb (56.7 kg)  08/07/16 127 lb 3.2 oz (57.7 kg)    Physical Exam  Constitutional: No distress.  HENT:  Head: Normocephalic and atraumatic.  Cardiovascular: Normal rate, regular rhythm and normal heart sounds.   Right-sided carotid bruit noted, left side with no bruit  Pulmonary/Chest: Effort normal and breath sounds normal.  Musculoskeletal: She exhibits no edema.  Neurological: She is alert.  Skin: Skin is warm and dry. She is not diaphoretic.     Assessment/Plan: Please see individual problem list.  Syncope Patient with syncopal episode likely related to dehydration and orthostasis given lab work revealing dehydration. Other workup in the hospital unremarkable. Unable to obtain an echo in the hospital and thus we will order this as an outpatient. Discussed staying well-hydrated and advised on at  least 64 ounces of water daily. Monitor for recurrence of symptoms. Given return precautions.  CKD (chronic kidney disease) stage 3, GFR 30-59 ml/min Patient with acute on chronic kidney injury possibly from dehydration and contributing to her syncopal episode. Discussed obtaining a repeat BMP today though she opted to hold off on this until she sees her nephrologist next week. I felt this was reasonable. She'll stay well hydrated. Monitor for recurrence of syncopal symptoms.   Diabetes mellitus type 2, controlled, with complications [id number] improved in the hospital. She'll continue her current insulin regimen.  Carotid artery occlusion without infarction Carotid artery stenosis appears to be unchanged from previously. Continue to monitor.   Orders Placed This Encounter  Procedures  . ECHOCARDIOGRAM COMPLETE    Please schedule for as soon as possible    Standing Status:   Future    Standing Expiration Date:   12/12/2017    Order Specific Question:   Where should this test be performed    Answer:   [facility]    Order Specific Question:   Complete or Limited study?    Answer:   Complete    Order Specific Question:   Does the patient have a known history of hypersensitivity to Perflutren (aka Scientist, research (medical) for echocardiograms - CHECK ALLERGIES)    Answer:   No    Order Specific Question:   ADMINISTER PERFLUTERN    Answer:   ADMINISTER PERFLUTREN    Order Specific Question:   Expected Date:    Answer:   ASAP    Order  Specific Question:   Reason for exam-Echo    Answer:   Syncope  780.2 / R55    Tommi Rumps, MD [place]

## 2016-09-13 NOTE — Assessment & Plan Note (Signed)
Carotid artery stenosis appears to be unchanged from previously. Continue to monitor.

## 2016-09-13 NOTE — Assessment & Plan Note (Signed)
A1c improved in the hospital. She'll continue her current insulin regimen.

## 2016-09-13 NOTE — Patient Instructions (Addendum)
Nice to see you. We will get you set up for an echo. I'm glad you're feeling better. Please try to stay hydrated and keep your appointment with your nephrologist next week. If you have to further syncope, or develop chest pain or palpitations or any new symptoms please seek medical attention immediately.

## 2016-09-18 ENCOUNTER — Telehealth: Payer: Self-pay | Admitting: *Deleted

## 2016-09-18 NOTE — Telephone Encounter (Signed)
Please advise 

## 2016-09-18 NOTE — Telephone Encounter (Signed)
Pt has requested a update on her having a eco cardiogram scheduled. She was advised to call the office if she had not heard from anyone Pt contact  [phone]

## 2016-09-21 DIAGNOSIS — I10 Essential (primary) hypertension: Secondary | ICD-10-CM | POA: Diagnosis not present

## 2016-09-21 DIAGNOSIS — N183 Chronic kidney disease, stage 3 (moderate): Secondary | ICD-10-CM | POA: Diagnosis not present

## 2016-09-21 DIAGNOSIS — E1122 Type 2 diabetes mellitus with diabetic chronic kidney disease: Secondary | ICD-10-CM | POA: Diagnosis not present

## 2016-09-21 DIAGNOSIS — E559 Vitamin D deficiency, unspecified: Secondary | ICD-10-CM | POA: Diagnosis not present

## 2016-09-27 ENCOUNTER — Ambulatory Visit
Admission: RE | Admit: 2016-09-27 | Discharge: 2016-09-27 | Disposition: A | Discharge status: Home / Self Care | Payer: Medicare Other | Source: Ambulatory Visit | Attending: Family Medicine | Admitting: Family Medicine

## 2016-09-27 DIAGNOSIS — E785 Hyperlipidemia, unspecified: Secondary | ICD-10-CM | POA: Insufficient documentation

## 2016-09-27 DIAGNOSIS — Z8673 Personal history of transient ischemic attack (TIA), and cerebral infarction without residual deficits: Secondary | ICD-10-CM | POA: Insufficient documentation

## 2016-09-27 DIAGNOSIS — E119 Type 2 diabetes mellitus without complications: Secondary | ICD-10-CM | POA: Diagnosis not present

## 2016-09-27 DIAGNOSIS — N289 Disorder of kidney and ureter, unspecified: Secondary | ICD-10-CM | POA: Diagnosis not present

## 2016-09-27 DIAGNOSIS — R55 Syncope and collapse: Secondary | ICD-10-CM | POA: Insufficient documentation

## 2016-09-27 DIAGNOSIS — I6529 Occlusion and stenosis of unspecified carotid artery: Secondary | ICD-10-CM | POA: Diagnosis not present

## 2016-09-27 NOTE — Progress Notes (Signed)
*  PRELIMINARY RESULTS* Echocardiogram 2D Echocardiogram has been performed.  Laurie Harris 09/27/2016, 12:13 PM

## 2016-09-28 ENCOUNTER — Telehealth: Payer: Self-pay | Admitting: Family Medicine

## 2016-09-28 NOTE — Telephone Encounter (Signed)
Spoke with patient regarding results. Discussed shadow that might be artifact and broached the subject of obtaining a CT scan. She is hesitant and thus I'll do a little bit of research to see if this is needed. We'll contact her next week regarding this. I did advise that if she does not hear from [name] on Monday she should contact us.

## 2016-10-05 NOTE — Telephone Encounter (Signed)
Patient notified

## 2016-10-05 NOTE — Telephone Encounter (Signed)
Please let the patient know that I reviewed her chart with regards to her echo. She had a recent chest x-ray that did not reveal any abnormalities of the heart contours. I suspect the abnormal shadow on her echo is related to surrounding calcifications. We will hold off on CT scan at this time unless she develops any further symptoms. Thanks.

## 2016-10-15 ENCOUNTER — Ambulatory Visit (INDEPENDENT_AMBULATORY_CARE_PROVIDER_SITE_OTHER): Payer: Medicare Other | Admitting: Cardiovascular Disease

## 2016-10-15 ENCOUNTER — Encounter: Payer: Self-pay | Admitting: Cardiovascular Disease

## 2016-10-15 VITALS — BP 136/68 | HR 77 | Ht 66.0 in | Wt 128.0 lb

## 2016-10-15 DIAGNOSIS — R55 Syncope and collapse: Secondary | ICD-10-CM | POA: Diagnosis not present

## 2016-10-15 DIAGNOSIS — I6529 Occlusion and stenosis of unspecified carotid artery: Secondary | ICD-10-CM

## 2016-10-15 DIAGNOSIS — I10 Essential (primary) hypertension: Secondary | ICD-10-CM

## 2016-10-15 DIAGNOSIS — E782 Mixed hyperlipidemia: Secondary | ICD-10-CM | POA: Diagnosis not present

## 2016-10-15 NOTE — Patient Instructions (Signed)
Medication Instructions:  Your physician recommends that you continue on your current medications as directed. Please refer to the Current Medication list given to you today.   Labwork: none  Testing/Procedures: Your physician has requested that you have a carotid duplex IN [place]. This test is an ultrasound of the carotid arteries in your neck. It looks at blood flow through these arteries that supply the brain with blood. Allow one hour for this exam. There are no restrictions or special instructions.    Follow-Up: Your physician wants you to follow-up in: 64 MONTHS WITH DR ARIDA SOMETIME AFTER YOU HAVE HAD YOUR CAROTID DOPPLERS. You will receive a reminder letter in the mail two months in advance. If you don't receive a letter, please call our office to schedule the follow-up appointment.   If you need a refill on your cardiac medications before your next appointment, please call your pharmacy.

## 2016-10-15 NOTE — Progress Notes (Signed)
Cardiology Office Note   Date:  10/15/2016   ID:  Laurie Harris, DOB 1941-06-17, MRN [id number]  PCP:  Tommi Rumps, MD  Cardiologist:   Kathlyn Sacramento, MD   Chief Complaint  Patient presents with  . other    Ref by Dr. Holley Raring for syncope. Meds reviewed by the pt. verbally. Pt. c/o dizziness when bending over.       History of Present Illness: Laurie Harris is a 76 y.o. female who was referred by Dr. Holley Raring for evaluation of syncope.  She has known history of essential hypertension, hyperlipidemia, intracranial hemorrhage while on warfarin, chronic kidney disease, diabetes mellitus and carotid artery disease. She was hospitalized at [facility] last month for syncope. She was standing at the stove cooking dinner when she felt lightheaded and dizzy. She sat down to elect herself and had a short period of loss of consciousness. It was a witnessed event and there was no reports of seizure-like activities or incontinence. She was noted to be mildly volume depleted with a creatinine of 1.37 and BUN of 42. Previous creatinine was 1.1.she improved with hydration. Carotid Doppler showed moderate bilateral carotid disease worse on the right side at 50-69%. Echocardiogram showed normal LV systolic function with grade 1 diastolic dysfunction. There was an abnormal shadow in the ascending aorta likely due to artifact from surrounding calcifications. She has been doing well since her hospitalization with no recurrent symptoms. She complains of mild orthostatic dizziness but no syncope or presyncope. She denies any chest pain or shortness of breath. No previous syncopal episodes except when she had low blood sugar in 2016. She denies any palpitations or tachycardia.      Past Medical History:  Diagnosis Date  . Carotid artery occlusion   . Chicken pox   . Diabetes mellitus without complication ([place])   . Dyspnea   . Hyperlipidemia   . Hypertension   . Renal insufficiency   . Stroke [facility])  2016    Past Surgical History:  Procedure Laterality Date  . NECK SURGERY       Current Outpatient Prescriptions  Medication Sig Dispense Refill  . alendronate (FOSAMAX) 70 MG tablet TAKE 1 TABLET EVERY 7 DAYS WITH A FULL GLASS OF WATER ON AN EMPTY STOMACH DO NOT LIE DOWN FOR AT LEAST 30 MIN 4 tablet 2  . aspirin EC 81 MG tablet Take 81 mg by mouth daily.    Marland Kitchen atorvastatin (LIPITOR) 80 MG tablet TAKE ONE TABLET EVERY DAY 90 tablet 3  . benazepril (LOTENSIN) 5 MG tablet Take 1 tablet (5 mg total) by mouth daily. (Patient taking differently: Take 2.5 mg by mouth daily. ) 180 tablet 1  . calcium-vitamin D (OSCAL WITH D) 500-200 MG-UNIT tablet Take 1 tablet by mouth 2 (two) times daily.    . cholecalciferol (VITAMIN D) 1000 units tablet Take 2,000 Units by mouth daily.    . fenofibrate 160 MG tablet Take 160 mg by mouth at bedtime.    Marland Kitchen HUMALOG MIX 75/25 KWIKPEN (75-25) 100 UNIT/ML Kwikpen INJECT 15 UNITS TWICE DAILY AS DIRECTED (Patient taking differently: INJECT 17 UNITS TWICE DAILY AS DIRECTED) 15 mL 2  . Multiple Vitamin (MULTIVITAMIN WITH MINERALS) TABS tablet Take 1 tablet by mouth daily.    Marland Kitchen NOVOFINE PLUS 32G X 4 MM MISC USE TWICE DAILY AS DIRECTED 100 each 6  . Omega-3 Fatty Acids (FISH OIL) 1200 MG CAPS Take 1,200 mg by mouth daily.    . pantoprazole (PROTONIX) 40  MG tablet Take 40 mg by mouth daily.     No current facility-administered medications for this visit.     Allergies:   Patient has no known allergies.    Social History:  The patient  reports that she has never smoked. She has never used smokeless tobacco. She reports that she does not drink alcohol or use drugs.   Family History:  The patient's family history includes Cancer in her father; Diabetes in her mother.    ROS:  Please see the history of present illness.   Otherwise, review of systems are positive for none.   All other systems are reviewed and negative.    PHYSICAL EXAM: VS:  BP 136/68 (BP Location:  Right Arm, Patient Position: Sitting, Cuff Size: Normal)   Pulse 77   Ht 5\' 6"  (1.676 m)   Wt 128 lb (58.1 kg)   BMI 20.66 kg/m  , BMI Body mass index is 20.66 kg/m. GEN: Well nourished, well developed, in no acute distress  HEENT: normal  Neck: no JVD, or masses.Bilateral carotid bruits  Cardiac: RRR; no murmurs, rubs, or gallops,no edema  Respiratory:  clear to auscultation bilaterally, normal work of breathing GI: soft, nontender, nondistended, + BS MS: no deformity or atrophy  Skin: warm and dry, no rash Neuro:  Strength and sensation are intact Psych: euthymic mood, full affect   EKG:  EKG is ordered today. The ekg ordered today demonstrates normal sinus rhythm with no significant ST or T wave changes.   Recent Labs: 06/13/2016: ALT 32 09/11/2016: BUN 31; Creatinine, Ser 0.96; Hemoglobin 11.8; Magnesium 1.6; Platelets 175; Potassium 3.7; Sodium 142; TSH 0.953    Lipid Panel    Component Value Date/Time   CHOL 122 09/11/2016 0540   TRIG 154 (H) 09/11/2016 0540   HDL 27 (L) 09/11/2016 0540   CHOLHDL 4.5 09/11/2016 0540   VLDL 31 09/11/2016 0540   LDLCALC 64 09/11/2016 0540      Wt Readings from Last 3 Encounters:  10/15/16 128 lb (58.1 kg)  09/13/16 125 lb 6.4 oz (56.9 kg)  09/10/16 125 lb (56.7 kg)       No flowsheet data found.    ASSESSMENT AND PLAN:  1.  Syncope: Most likely was neurocardiogenic or vasovagal syncope in the setting of mild volume depletion. No recurrent episodes since then. Her workup so far has been unremarkable including echocardiogram and carotid Doppler. Her baseline EKG is normal with nothing to suggest arrhythmia. She is not significantly orthostatic today. The patient was reassured. If she develops recurrent symptoms of syncope or presyncope, then further evaluation with a monitor and/or stress testing might be needed. She currently has no symptoms of angina.   2. Bilateral carotid disease: Recommend repeat carotid Doppler in 1  year. She is already on low-dose aspirin.  3. Hyperlipidemia: Currently on high dose atorvastatin and fenofibrate. Most recent lipid profile showed an LDL of 64 and triglyceride of 154.    Disposition:   FU with me in 1 year  Signed,  Kathlyn Sacramento, MD  10/15/2016 11:30 AM    [place]

## 2016-11-05 ENCOUNTER — Ambulatory Visit (INDEPENDENT_AMBULATORY_CARE_PROVIDER_SITE_OTHER): Payer: Medicare Other | Admitting: Family Medicine

## 2016-11-05 ENCOUNTER — Encounter: Payer: Self-pay | Admitting: Family Medicine

## 2016-11-05 VITALS — BP 130/72 | HR 74 | Temp 97.9°F | Wt 128.0 lb

## 2016-11-05 DIAGNOSIS — R55 Syncope and collapse: Secondary | ICD-10-CM | POA: Diagnosis not present

## 2016-11-05 DIAGNOSIS — I6529 Occlusion and stenosis of unspecified carotid artery: Secondary | ICD-10-CM

## 2016-11-05 DIAGNOSIS — Z794 Long term (current) use of insulin: Secondary | ICD-10-CM | POA: Diagnosis not present

## 2016-11-05 DIAGNOSIS — E118 Type 2 diabetes mellitus with unspecified complications: Secondary | ICD-10-CM | POA: Diagnosis not present

## 2016-11-05 DIAGNOSIS — I10 Essential (primary) hypertension: Secondary | ICD-10-CM | POA: Diagnosis not present

## 2016-11-05 DIAGNOSIS — R109 Unspecified abdominal pain: Secondary | ICD-10-CM | POA: Insufficient documentation

## 2016-11-05 LAB — COMPREHENSIVE METABOLIC PANEL
ALBUMIN: 4 g/dL (ref 3.5–5.2)
ALK PHOS: 50 U/L (ref 39–117)
ALT: 26 U/L (ref 0–35)
AST: 26 U/L (ref 0–37)
BUN: 27 mg/dL — AB (ref 6–23)
CALCIUM: 9.8 mg/dL (ref 8.4–10.5)
CO2: 28 mEq/L (ref 19–32)
Chloride: 107 mEq/L (ref 96–112)
Creatinine, Ser: 1.12 mg/dL (ref 0.40–1.20)
GFR: 50 mL/min — AB (ref >60.00)
Glucose, Bld: 165 mg/dL — ABNORMAL HIGH (ref 70–99)
POTASSIUM: 4 meq/L (ref 3.5–5.1)
Sodium: 141 mEq/L (ref 135–145)
TOTAL PROTEIN: 6.6 g/dL (ref 6.0–8.3)
Total Bilirubin: 0.6 mg/dL (ref 0.2–1.2)

## 2016-11-05 LAB — CBC
HCT: 37 % (ref 36.0–46.0)
HEMOGLOBIN: 12.6 g/dL (ref 12.0–15.0)
MCHC: 34.1 g/dL (ref 30.0–36.0)
MCV: 99.2 fl (ref 78.0–100.0)
PLATELETS: 186 10*3/uL (ref 150.0–400.0)
RBC: 3.73 Mil/uL — AB (ref 3.87–5.11)
RDW: 13.6 % (ref 11.5–15.5)
WBC: 5 10*3/uL (ref 4.0–10.5)

## 2016-11-05 LAB — SEDIMENTATION RATE: Sed Rate: 1 mm/hr (ref 0–30)

## 2016-11-05 NOTE — Assessment & Plan Note (Signed)
At goal. At times is slightly low diastolically in the morning though reports no symptoms. Typically higher later in the day. Discussed continuing the very low dose of benazepril for renal protection. She'll continue to monitor blood pressure and if she develops any lightheadedness or other symptoms let us know.

## 2016-11-05 NOTE — Progress Notes (Signed)
  Tommi Rumps, MD Phone: [phone]  Laurie Harris is a 76 y.o. female who presents today for f/u.  HYPERTENSION  Disease Monitoring  Home BP Monitoring in the morning typically runs in the 100-123/50-60 range. Has gotten as low as 44 diastolically. Later in the day typically runs in the 120s/70s Chest pain- no    Dyspnea- no Medications  Compliance-  taking benazepril 2.5 mg daily. Lightheadedness-  no  Edema- no  Syncope: Has not had any recurrence. Has not had any lightheadedness. She's trying to drink well.  Diabetes: Taking Humalog Mix 75/25 17 units twice daily. No polyuria or polydipsia. No hypoglycemia.  Patient notes a dull right-sided mid abdominal discomfort over the last week or so. It comes and goes. Lasts for 15 minutes. Does not radiate. No nausea, vomiting, or diarrhea. No fevers. No vaginal discharge. No dysuria. She does have a history of appendicitis though this feels like it higher than that.  PMH: nonsmoker.   ROS see history of present illness  Objective  Physical Exam Vitals:   11/05/16 0908 11/05/16 0925  BP: (!) 142/76 130/72  Pulse: 74   Temp: 97.9 F (36.6 C)     BP Readings from Last 3 Encounters:  11/05/16 130/72  10/15/16 136/68  09/13/16 128/68   Wt Readings from Last 3 Encounters:  11/05/16 128 lb (58.1 kg)  10/15/16 128 lb (58.1 kg)  09/13/16 125 lb 6.4 oz (56.9 kg)    Physical Exam  Constitutional: No distress.  HENT:  Head: Normocephalic and atraumatic.  Cardiovascular: Normal rate, regular rhythm and normal heart sounds.   Pulmonary/Chest: Effort normal and breath sounds normal.  Abdominal: Soft. Bowel sounds are normal. She exhibits no distension. There is no tenderness. There is no rebound and no guarding.  Musculoskeletal: She exhibits no edema.  Neurological: She is alert. Gait normal.  Skin: Skin is warm and dry. She is not diaphoretic.     Assessment/Plan: Please see individual problem list.  Benign essential  HTN At goal. At times is slightly low diastolically in the morning though reports no symptoms. Typically higher later in the day. Discussed continuing the very low dose of benazepril for renal protection. She'll continue to monitor blood pressure and if she develops any lightheadedness or other symptoms let us know.  Syncope No recurrence. Discussed staying well hydrated.  Diabetes mellitus type 2, controlled, with complications Has had some slightly elevated sugars, though most recent A1c very well controlled. She does report some cost concerns with her current insulin and we will have the pharmacist check on this to see if there are any better options.  she'll continue current dosing at this time.   Abdominal pain Patient with vague right mid abdominal discomfort. Benign abdominal exam today. Not in the right lower quadrant or near McBurney's point. We will obtain a CBC and sedimentation rate to evaluate for inflammation and infection. If normal she'll continue to monitor. If elevated would consider CT scan abdomen and pelvis to evaluate for cause. She is given return precautions.   Orders Placed This Encounter  Procedures  . CBC  . Sed Rate (ESR)  . Comp Met (CMET)    Tommi Rumps, MD [place]

## 2016-11-05 NOTE — Progress Notes (Signed)
Pre visit review using our clinic review tool, if applicable. No additional management support is needed unless otherwise documented below in the visit note. 

## 2016-11-05 NOTE — Assessment & Plan Note (Signed)
No recurrence. Discussed staying well hydrated.

## 2016-11-05 NOTE — Assessment & Plan Note (Addendum)
Has had some slightly elevated sugars, though most recent A1c very well controlled. She does report some cost concerns with her current insulin and we will have the pharmacist check on this to see if there are any better options.  she'll continue current dosing at this time.

## 2016-11-05 NOTE — Assessment & Plan Note (Signed)
Patient with vague right mid abdominal discomfort. Benign abdominal exam today. Not in the right lower quadrant or near McBurney's point. We will obtain a CBC and sedimentation rate to evaluate for inflammation and infection. If normal she'll continue to monitor. If elevated would consider CT scan abdomen and pelvis to evaluate for cause. She is given return precautions.

## 2016-11-05 NOTE — Patient Instructions (Signed)
Nice to see you. We will check some lab work today and contact her with the results. If you have recurrent syncope please seek medical attention. Please continue to check your blood sugars.

## 2016-11-08 ENCOUNTER — Other Ambulatory Visit: Payer: Self-pay | Admitting: Family Medicine

## 2016-11-16 ENCOUNTER — Other Ambulatory Visit: Payer: Self-pay

## 2016-11-16 MED ORDER — INSULIN LISPRO PROT & LISPRO (75-25 MIX) 100 UNIT/ML KWIKPEN: PEN_INJECTOR | SUBCUTANEOUS | 2 refills | Status: DC

## 2016-11-16 NOTE — Telephone Encounter (Signed)
Last OV 11/05/16 last filled by Dr,Walker 03/15/16 67ml 2rf patient takes 17 units

## 2016-12-12 ENCOUNTER — Telehealth: Payer: Self-pay | Admitting: Radiology

## 2016-12-12 DIAGNOSIS — E118 Type 2 diabetes mellitus with unspecified complications: Secondary | ICD-10-CM

## 2016-12-12 DIAGNOSIS — Z794 Long term (current) use of insulin: Principal | ICD-10-CM

## 2016-12-12 NOTE — Telephone Encounter (Signed)
Called patient to verify what lab appt was for tomorrow, pt stated it is for her A1C check.

## 2016-12-12 NOTE — Telephone Encounter (Signed)
Please check with the patient to see if this is for an A1c. I do not see where we asked her to come back for labs as her most recent labs were stable.

## 2016-12-12 NOTE — Telephone Encounter (Signed)
Pt coming in for labs tomorrow, please place future orders. Thank you.  

## 2016-12-12 NOTE — Addendum Note (Signed)
Addended by: Caryl Bis ERIC G on: 12/12/2016 01:15 PM   Modules accepted: Orders

## 2016-12-13 ENCOUNTER — Other Ambulatory Visit (INDEPENDENT_AMBULATORY_CARE_PROVIDER_SITE_OTHER): Payer: Medicare Other

## 2016-12-13 DIAGNOSIS — Z794 Long term (current) use of insulin: Secondary | ICD-10-CM | POA: Diagnosis not present

## 2016-12-13 DIAGNOSIS — E118 Type 2 diabetes mellitus with unspecified complications: Secondary | ICD-10-CM

## 2016-12-13 LAB — POCT GLYCOSYLATED HEMOGLOBIN (HGB A1C): HEMOGLOBIN A1C: 6.7

## 2016-12-15 ENCOUNTER — Other Ambulatory Visit: Payer: Self-pay | Admitting: Nurse Practitioner

## 2016-12-17 ENCOUNTER — Other Ambulatory Visit: Payer: Self-pay | Admitting: Nurse Practitioner

## 2017-01-07 DIAGNOSIS — N183 Chronic kidney disease, stage 3 (moderate): Secondary | ICD-10-CM | POA: Diagnosis not present

## 2017-01-07 DIAGNOSIS — E1122 Type 2 diabetes mellitus with diabetic chronic kidney disease: Secondary | ICD-10-CM | POA: Diagnosis not present

## 2017-01-07 DIAGNOSIS — R809 Proteinuria, unspecified: Secondary | ICD-10-CM | POA: Diagnosis not present

## 2017-01-07 DIAGNOSIS — I10 Essential (primary) hypertension: Secondary | ICD-10-CM | POA: Diagnosis not present

## 2017-01-07 DIAGNOSIS — E559 Vitamin D deficiency, unspecified: Secondary | ICD-10-CM | POA: Diagnosis not present

## 2017-01-21 ENCOUNTER — Encounter: Payer: Self-pay | Admitting: Emergency Medicine

## 2017-01-21 ENCOUNTER — Emergency Department
Admission: EM | Admit: 2017-01-21 | Discharge: 2017-01-22 | Disposition: A | Discharge status: Home / Self Care | Payer: Medicare Other | Attending: Emergency Medicine | Admitting: Emergency Medicine

## 2017-01-21 ENCOUNTER — Emergency Department: Payer: Medicare Other

## 2017-01-21 DIAGNOSIS — Z7982 Long term (current) use of aspirin: Secondary | ICD-10-CM | POA: Insufficient documentation

## 2017-01-21 DIAGNOSIS — Z79899 Other long term (current) drug therapy: Secondary | ICD-10-CM | POA: Diagnosis not present

## 2017-01-21 DIAGNOSIS — N183 Chronic kidney disease, stage 3 (moderate): Secondary | ICD-10-CM | POA: Diagnosis not present

## 2017-01-21 DIAGNOSIS — E1122 Type 2 diabetes mellitus with diabetic chronic kidney disease: Secondary | ICD-10-CM | POA: Insufficient documentation

## 2017-01-21 DIAGNOSIS — I129 Hypertensive chronic kidney disease with stage 1 through stage 4 chronic kidney disease, or unspecified chronic kidney disease: Secondary | ICD-10-CM | POA: Insufficient documentation

## 2017-01-21 DIAGNOSIS — R079 Chest pain, unspecified: Secondary | ICD-10-CM | POA: Insufficient documentation

## 2017-01-21 DIAGNOSIS — R072 Precordial pain: Secondary | ICD-10-CM | POA: Diagnosis not present

## 2017-01-21 DIAGNOSIS — Z794 Long term (current) use of insulin: Secondary | ICD-10-CM | POA: Insufficient documentation

## 2017-01-21 LAB — CBC
HEMATOCRIT: 38.2 % (ref 35.0–47.0)
Hemoglobin: 13.1 g/dL (ref 12.0–16.0)
MCH: 33.9 pg (ref 26.0–34.0)
MCHC: 34.2 g/dL (ref 32.0–36.0)
MCV: 99 fL (ref 80.0–100.0)
PLATELETS: 223 10*3/uL (ref 150–440)
RBC: 3.85 MIL/uL (ref 3.80–5.20)
RDW: 13.9 % (ref 11.5–14.5)
WBC: 8.7 10*3/uL (ref 3.6–11.0)

## 2017-01-21 LAB — GLUCOSE, CAPILLARY: Glucose-Capillary: 201 mg/dL — ABNORMAL HIGH (ref 65–99)

## 2017-01-21 LAB — BASIC METABOLIC PANEL
Anion gap: 8 (ref 5–15)
BUN: 38 mg/dL — AB (ref 6–20)
CHLORIDE: 104 mmol/L (ref 101–111)
CO2: 26 mmol/L (ref 22–32)
CREATININE: 1.4 mg/dL — AB (ref 0.44–1.00)
Calcium: 9.6 mg/dL (ref 8.9–10.3)
GFR calc Af Amer: 41 mL/min — ABNORMAL LOW (ref >60)
GFR calc non Af Amer: 35 mL/min — ABNORMAL LOW (ref >60)
GLUCOSE: 179 mg/dL — AB (ref 65–99)
POTASSIUM: 4.1 mmol/L (ref 3.5–5.1)
SODIUM: 138 mmol/L (ref 135–145)

## 2017-01-21 LAB — TROPONIN I: Troponin I: <0.03 ng/mL (ref <0.03)

## 2017-01-21 NOTE — ED Triage Notes (Signed)
Pt presents to ED c/o chest pain x4 hours. Dull, constant pain radiating to back.

## 2017-01-21 NOTE — ED Notes (Signed)
Pt returns to STAT desk, requesting that I recheck her FS; again explained to pt purpose of triage and protocols and updated on wait time; pt now apologizing for her earlier behavior

## 2017-01-21 NOTE — ED Notes (Addendum)
Husband to desk, stating "she is going to pass out, she can't keep waiting"; instructed husband to bring pt to triage so that we can reassess her; husband refuses and st 'that's OK, I'll get her some crackers"; again instructed husband to allow [name] to recheck her if she is feeling as if she is going to pass out, and st "no she's OK, she just needs to eat"

## 2017-01-21 NOTE — ED Notes (Addendum)
Husband and pt to desk to st that she is leaving now; pt st "I haven't ate since lunch and I need to eat"; pt encouraged to allow nurse to recheck her vs and FSBS but refuses, stating "that ain't gonna help my hunger!, they assured me that I would go straight back and be seen"; when pt asked to clarify who told her that, pt st EMS; explained to pt the process of triage but pt and husband st that they are leaving

## 2017-01-22 ENCOUNTER — Ambulatory Visit (INDEPENDENT_AMBULATORY_CARE_PROVIDER_SITE_OTHER): Payer: Medicare Other | Admitting: Nurse Practitioner

## 2017-01-22 ENCOUNTER — Encounter: Payer: Self-pay | Admitting: Nurse Practitioner

## 2017-01-22 VITALS — BP 154/78 | HR 70 | Ht 66.0 in | Wt 125.0 lb

## 2017-01-22 DIAGNOSIS — I10 Essential (primary) hypertension: Secondary | ICD-10-CM

## 2017-01-22 DIAGNOSIS — E782 Mixed hyperlipidemia: Secondary | ICD-10-CM | POA: Diagnosis not present

## 2017-01-22 DIAGNOSIS — I739 Peripheral vascular disease, unspecified: Secondary | ICD-10-CM

## 2017-01-22 DIAGNOSIS — I6529 Occlusion and stenosis of unspecified carotid artery: Secondary | ICD-10-CM

## 2017-01-22 DIAGNOSIS — I779 Disorder of arteries and arterioles, unspecified: Secondary | ICD-10-CM

## 2017-01-22 DIAGNOSIS — R079 Chest pain, unspecified: Secondary | ICD-10-CM

## 2017-01-22 LAB — TROPONIN I: Troponin I: <0.03 ng/mL (ref <0.03)

## 2017-01-22 MED ORDER — NITROGLYCERIN 0.4 MG SL SUBL
0.4000 mg | SUBLINGUAL_TABLET | SUBLINGUAL | Status: DC | PRN
  Administered 2017-01-22: 0.4 mg via SUBLINGUAL
  Filled 2017-01-22: qty 1

## 2017-01-22 MED ORDER — GI COCKTAIL ~~LOC~~
30.0000 mL | Freq: Once | ORAL | Status: AC
  Administered 2017-01-22: 30 mL via ORAL
  Filled 2017-01-22: qty 30

## 2017-01-22 NOTE — ED Notes (Signed)
Pt. Verbalizes understanding of d/c instructions and follow-up. VS stable and pain controlled per pt.  Pt. In NAD at time of d/c and denies further concerns regarding this visit. Pt. Stable at the time of departure from the unit, departing unit by the safest and most appropriate manner per that pt condition and limitations. Pt advised to return to the ED at any time for emergent concerns, or for new/worsening symptoms.   

## 2017-01-22 NOTE — Progress Notes (Signed)
Office Visit    Patient Name: Laurie Harris Date of Encounter: 01/22/2017  Primary Care Provider:  [name], MD Primary Cardiologist:  Jerilynn Mages. Fletcher Anon, MD   Chief Complaint    76 year old female with a prior history of hemorrhagic stroke, hypertension, hyperlipidemia, syncope, carotid arterial disease, and stage III chronic kidney disease, who presents for evaluation secondary to chest pain.  Past Medical History    Past Medical History:  Diagnosis Date  . Carotid arterial disease ([place])    a. 09/2016 Carotid U/S: R carotid bifurcation dzs of 50-69%, L carotid bifurcation dzs of <50%. Patent vertebral arteries w/ anegrade flow.  . Chicken pox   . CKD (chronic kidney disease), stage III   . Diabetes mellitus without complication ([place])   . Hemorrhagic stroke ([place]) 2016   a. in the setting of warfarin therapy.  . Hyperlipidemia   . Hypertension   . Syncope    a. 09/2016 Echo: EF 55-60%, no rwma, Gr1 DD.   Past Surgical History:  Procedure Laterality Date  . ABCESS DRAINAGE  06/14/2016   appendix  . NECK SURGERY      Allergies  No Known Allergies  History of Present Illness    76 year old female with the above past medical history including diabetes, hypertension, hyperlipidemia, and stage III chronic kidney disease. She also suffered a hemorrhagic stroke in the setting of Coumadin therapy in 2016. She recovered reasonably well from this. Earlier this year, she had an episode of syncope that occurred while she was in her kitchen. She was admitted to [facility] without any significant findings. His was felt that she may have been volume depleted. She has not had any recurrence of syncope since that episode.  She was in her usual state of health until approximately 3 PM on May 21, when she had sudden onset of 8/10 substernal chest pressure without associated symptoms. This did not worsen with movement, palpation, position changes, deep breathing, or cough. Symptoms  persisted through dinnertime and she called EMS 3 hours after onset. She was given 4 baby aspirin with some reduction in chest discomfort. She was taken to the [facility] emergency department where ECG was nonacute. Troponins were normal. She was eventually treated with sublingual nitroglycerin sometime after midnight with complete resolution of chest discomfort. She was discharged home and advised to follow-up with cardiology. She has not had any recurrence of chest pain. She denies any recent dyspnea on exertion, palpitations, PND, orthopnea, dizziness, syncope, edema, or early satiety.  Home Medications    Prior to Admission medications   Medication Sig Start Date End Date Taking? Authorizing Provider  alendronate (FOSAMAX) 70 MG tablet TAKE 1 TABLET EVERY 7 DAYS WITH A FULL GLASS OF WATER ON AN EMPTY STOMACH DO NOT LIE DOWN FOR AT LEAST 30 MIN 11/08/16  Yes [name], MD  aspirin EC 81 MG tablet Take 81 mg by mouth daily.   Yes [provider]  atorvastatin (LIPITOR) 80 MG tablet TAKE ONE TABLET EVERY DAY 12/17/16  Yes [name], MD  benazepril (LOTENSIN) 5 MG tablet Take 1 tablet (5 mg total) by mouth daily. Patient taking differently: Take 2.5 mg by mouth daily.  05/09/16  Yes [name], MD  calcium-vitamin D (OSCAL WITH D) 500-200 MG-UNIT tablet Take 1 tablet by mouth 2 (two) times daily.   Yes [provider]  cholecalciferol (VITAMIN D) 1000 units tablet Take 2,000 Units by mouth daily.   Yes [provider]  fenofibrate 160 MG tablet Take 160 mg by mouth at bedtime.   Yes [provider]  Insulin Lispro Prot & Lispro (HUMALOG MIX 75/25 KWIKPEN) (75-25) 100 UNIT/ML Kwikpen INJECT 17 UNITS TWICE DAILY AS DIRECTED 11/16/16  Yes [name], MD  Multiple Vitamin (MULTIVITAMIN WITH MINERALS) TABS tablet Take 1 tablet by mouth daily.   Yes [provider]  NOVOFINE PLUS 32G X 4 MM MISC USE TWICE DAILY AS DIRECTED  03/15/16  Yes Jackolyn Confer, MD  Omega-3 Fatty Acids (FISH OIL) 1200 MG CAPS Take 1,200 mg by mouth daily.   Yes [provider]  pantoprazole (PROTONIX) 40 MG tablet Take 40 mg by mouth daily.   Yes [provider]    Review of Systems    Prolonged episode of chest pain yesterday lasting approximately 11 hours total without objective findings for ischemia. She denies dyspnea, palpitations, PND, orthopnea, dizziness, syncopal, edema, or early satiety.  All other systems reviewed and are otherwise negative except as noted above.  Physical Exam    VS:  BP (!) 154/78 (BP Location: Left Arm, Patient Position: Sitting, Cuff Size: Normal)   Pulse 70   Ht 5\' 6"  (1.676 m)   Wt 125 lb (56.7 kg)   BMI 20.18 kg/m  , BMI Body mass index is 20.18 kg/m. GEN: Well nourished, well developed, in no acute distress.  HEENT: normal.  Neck: Supple, no JVD, carotid bruits, or masses. Cardiac: RRR, no murmurs, rubs, or gallops. No clubbing, cyanosis, edema.  Radials/DP/PT 2+ and equal bilaterally.  Respiratory:  Respirations regular and unlabored, clear to auscultation bilaterally. GI: Soft, nontender, nondistended, BS + x 4. MS: no deformity or atrophy. Skin: warm and dry, no rash. Neuro:  Strength and sensation are intact. Psych: Normal affect.  Accessory Clinical Findings    ECG - Regular sinus rhythm, 70, left axis deviation, LVH, no acute ST or T changes.  Assessment & Plan    1.  Precordial chest pain: Patient developed substernal chest discomfort yesterday without associated symptoms. In all, symptoms lasted about 11 hours. Despite prolonged symptoms, there were no objective evidence of ischemia in the emergency department, with normal EKG and troponins. She has not had any recurrence. I will arrange for a Lexiscan Myoview to rule out ischemia. She is on aspirin as well as a PPI.  2. Essential hypertension: Blood pressure is elevated at 154/78. We discussed this today and  she does check her blood pressure daily. It is typically less than 810 systolic when at home. I recommended they continue to follow her blood pressure at home and if she notes systolics greater than 1:75 to contact us as we could easily titrate her hospital further. Next  3. Hyperlipidemia: She is on Lipitor and fenofibrate therapy. LDL was 64 in January 2018.  4. History of syncope: No recurrence.  5. Carotid arterial disease: Moderate disease on the left with mild disease on the right. Plan follow-up ultrasound in January 2019.  6. Disposition: Follow-up Lexiscan Myoview. Follow-up with Dr. Fletcher Anon in 3 months or sooner if necessary.  Murray Hodgkins, NP 01/22/2017, 4:51 PM

## 2017-01-22 NOTE — Patient Instructions (Addendum)
Testing/Procedures: Laurie Harris  Your caregiver has ordered a Stress Test with nuclear imaging. The purpose of this test is to evaluate the blood supply to your heart muscle. This procedure is referred to as a "Non-Invasive Stress Test." This is because other than having an IV started in your vein, nothing is inserted or "invades" your body. Cardiac stress tests are done to find areas of poor blood flow to the heart by determining the extent of coronary artery disease (CAD). Some patients exercise on a treadmill, which naturally increases the blood flow to your heart, while others who are  unable to walk on a treadmill due to physical limitations have a pharmacologic/chemical stress agent called Lexiscan . This medicine will mimic walking on a treadmill by temporarily increasing your coronary blood flow.   Please note: these test may take anywhere between 2-4 hours to complete  PLEASE REPORT TO [name] AT THE FIRST DESK WILL DIRECT YOU WHERE TO GO  Date of Procedure:_Friday May 25, 2018__  Arrival Time for Procedure:__Arrive at 09:15AM____  Instructions regarding medication:   __X__ : Hold diabetes medication morning of procedure  __X__:  Take only 8 units of insulin the night before and hold your morning dose    PLEASE NOTIFY THE OFFICE AT LEAST 24 HOURS IN ADVANCE IF YOU ARE UNABLE TO KEEP YOUR APPOINTMENT.  [phone] AND  PLEASE NOTIFY NUCLEAR MEDICINE AT [facility] AT LEAST 24 HOURS IN ADVANCE IF YOU ARE UNABLE TO KEEP YOUR APPOINTMENT. [phone]  How to prepare for your Myoview test:  1. Do not eat or drink after midnight 2. No caffeine for 24 hours prior to test 3. No smoking 24 hours prior to test. 4. Your medication may be taken with water.  If your doctor stopped a medication because of this test, do not take that medication. 5. Ladies, please do not wear dresses.  Skirts or pants are appropriate. Please wear a short sleeve shirt. 6. No  perfume, cologne or lotion. 7. Wear comfortable walking shoes. No heels!   Follow-Up: Your physician wants you to follow-up in: 3 months with Dr. Fletcher Anon. You will receive a reminder letter in the mail two months in advance. If you don't receive a letter, please call our office to schedule the follow-up appointment.  It was a pleasure seeing you today here in the office. Please do not hesitate to give [name] a call back if you have any further questions. [name], [name]    Pharmacologic Stress Electrocardiogram Introduction A pharmacologic stress electrocardiogram is a heart (cardiac) test that uses nuclear imaging to evaluate the blood supply to your heart. This test may also be called a pharmacologic stress electrocardiography. Pharmacologic means that a medicine is used to increase your heart rate and blood pressure. This stress test is done to find areas of poor blood flow to the heart by determining the extent of coronary artery disease (CAD). Some people exercise on a treadmill, which naturally increases the blood flow to the heart. For those people unable to exercise on a treadmill, a medicine is used. This medicine stimulates your heart and will cause your heart to beat harder and more quickly, as if you were exercising. Pharmacologic stress tests can help determine:  The adequacy of blood flow to your heart during increased levels of activity in order to clear you for discharge home.  The extent of coronary artery blockage caused by CAD.  Your prognosis if you have suffered  a heart attack.  The effectiveness of cardiac procedures done, such as an angioplasty, which can increase the circulation in your coronary arteries.  Causes of chest pain or pressure. LET [facility] CARE PROVIDER KNOW ABOUT:  Any allergies you have.  All medicines you are taking, including vitamins, herbs, eye drops, creams, and over-the-counter medicines.  Previous problems you or members of  your family have had with the use of anesthetics.  Any blood disorders you have.  Previous surgeries you have had.  Medical conditions you have.  Possibility of pregnancy, if this applies.  If you are currently breastfeeding. RISKS AND COMPLICATIONS Generally, this is a safe procedure. However, as with any procedure, complications can occur. Possible complications include:  You develop pain or pressure in the following areas:  Chest.  Jaw or neck.  Between your shoulder blades.  Radiating down your left arm.  Headache.  Dizziness or light-headedness.  Shortness of breath.  Increased or irregular heartbeat.  Low blood pressure.  Nausea or vomiting.  Flushing.  Redness going up the arm and slight pain during injection of medicine.  Heart attack (rare). BEFORE THE PROCEDURE  Avoid all forms of caffeine for 24 hours before your test or as directed by your health care provider. This includes coffee, tea (even decaffeinated tea), caffeinated sodas, chocolate, cocoa, and certain pain medicines.  Follow your health care provider's instructions regarding eating and drinking before the test.  Take your medicines as directed at regular times with water unless instructed otherwise. Exceptions may include:  If you have diabetes, ask how you are to take your insulin or pills. It is common to adjust insulin dosing the morning of the test.  If you are taking beta-blocker medicines, it is important to talk to your health care provider about these medicines well before the date of your test. Taking beta-blocker medicines may interfere with the test. In some cases, these medicines need to be changed or stopped 24 hours or more before the test.  If you wear a nitroglycerin patch, it may need to be removed prior to the test. Ask your health care provider if the patch should be removed before the test.  If you use an inhaler for any breathing condition, bring it with you to the  test.  If you are an outpatient, bring a snack so you can eat right after the stress phase of the test.  Do not smoke for 4 hours prior to the test or as directed by your health care provider.  Do not apply lotions, powders, creams, or oils on your chest prior to the test.  Wear comfortable shoes and clothing. Let your health care provider know if you were unable to complete or follow the preparations for your test. PROCEDURE  Multiple patches (electrodes) will be put on your chest. If needed, small areas of your chest may be shaved to get better contact with the electrodes. Once the electrodes are attached to your body, multiple wires will be attached to the electrodes, and your heart rate will be monitored.  An IV access will be started. A nuclear trace (isotope) is given. The isotope may be given intravenously, or it may be swallowed. Nuclear refers to several types of radioactive isotopes, and the nuclear isotope lights up the arteries so that the nuclear images are clear. The isotope is absorbed by your body. This results in low radiation exposure.  A resting nuclear image is taken to show how your heart functions at rest.  A  medicine is given through the IV access.  A second scan is done about 1 hour after the medicine injection and determines how your heart functions under stress.  During this stress phase, you will be connected to an electrocardiogram machine. Your blood pressure and oxygen levels will be monitored. What to expect after the procedure  Your heart rate and blood pressure will be monitored after the test.  You may return to your normal schedule, including diet,activities, and medicines, unless your health care provider tells you otherwise. This information is not intended to replace advice given to you by your health care provider. Make sure you discuss any questions you have with your health care provider. Document Released: 01/06/2009 Document Revised: 01/26/2016  Document Reviewed: 02/27/2016 Elsevier Interactive Patient Education  2017 [employer].

## 2017-01-22 NOTE — ED Notes (Signed)
MD Dahlia Client at bedside with update.

## 2017-01-22 NOTE — ED Provider Notes (Signed)
[facility] Emergency Department Provider Note   ____________________________________________   First MD Initiated Contact with Patient 01/22/17 0010     (approximate)  I have reviewed the triage vital signs and the nursing notes.   HISTORY  Chief Complaint Chest Pain    HPI Laurie Harris is a 76 y.o. female who comes into the hospital today with chest pain. The patient reports it started this afternoon. She was sitting in a recliner when this started. She was taking a nap. The pain is in the middle of her chest and she reports it radiates more to the left than it does to the right. The patient did not take any medicine for her pain. She reports that EMS gave her 4 aspirin. Dictating give her any other medicine. She states that she ate around 9 PM and the pain has since eased off since then. It is now in her left shoulder. She reports that the pain is about a 4-5 out of 10 in intensity. She denies any shortness of breath, nausea, vomiting, sweats. She reports that she does have frequent dizziness but that is due to a previous hemorrhagic stroke. She also has some left-sided residual weakness from that stroke. The patient decided to come into the hospital today for further evaluation of these symptoms.   Past Medical History:  Diagnosis Date  . Carotid artery occlusion   . Chicken pox   . Diabetes mellitus without complication ([place])   . Dyspnea   . Hyperlipidemia   . Hypertension   . Renal insufficiency   . Stroke [facility]) 2016    Patient Active Problem List   Diagnosis Date Noted  . Abdominal pain 11/05/2016  . Syncope 09/10/2016  . Appendicitis 08/07/2016  . Carotid artery disease ([place]) 06/19/2016  . CKD (chronic kidney disease) stage 3, GFR 30-59 ml/min 05/10/2016  . Screening for breast cancer 02/06/2016  . Diabetes mellitus type 2, controlled, with complications ([place]) 61/95/0932  . Benign essential HTN 03/16/2015  . Hyperlipidemia 03/16/2015    . Cerebellar hemorrhage ([place]) 03/16/2015  . Adenomatous polyp of colon 06/04/2014  . Carotid artery occlusion without infarction 08/18/2013    Past Surgical History:  Procedure Laterality Date  . NECK SURGERY      Prior to Admission medications   Medication Sig Start Date End Date Taking? Authorizing Provider  alendronate (FOSAMAX) 70 MG tablet TAKE 1 TABLET EVERY 7 DAYS WITH A FULL GLASS OF WATER ON AN EMPTY STOMACH DO NOT LIE DOWN FOR AT LEAST 30 MIN 11/08/16   [name], MD  aspirin EC 81 MG tablet Take 81 mg by mouth daily.    [provider]  atorvastatin (LIPITOR) 80 MG tablet TAKE ONE TABLET EVERY DAY 12/17/16   [name], MD  benazepril (LOTENSIN) 5 MG tablet Take 1 tablet (5 mg total) by mouth daily. Patient taking differently: Take 2.5 mg by mouth daily.  05/09/16   [name], MD  calcium-vitamin D (OSCAL WITH D) 500-200 MG-UNIT tablet Take 1 tablet by mouth 2 (two) times daily.    [provider]  cholecalciferol (VITAMIN D) 1000 units tablet Take 2,000 Units by mouth daily.    [provider]  fenofibrate 160 MG tablet Take 160 mg by mouth at bedtime.    [provider]  fenofibrate 160 MG tablet TAKE ONE TABLET EVERY DAY 12/19/16   [name], MD  Insulin Lispro Prot & Lispro (HUMALOG MIX 75/25 KWIKPEN) (75-25) 100  UNIT/ML Kwikpen INJECT 17 UNITS TWICE DAILY AS DIRECTED 11/16/16   [name], MD  Multiple Vitamin (MULTIVITAMIN WITH MINERALS) TABS tablet Take 1 tablet by mouth daily.    [provider]  NOVOFINE PLUS 32G X 4 MM MISC USE TWICE DAILY AS DIRECTED 03/15/16   Jackolyn Confer, MD  Omega-3 Fatty Acids (FISH OIL) 1200 MG CAPS Take 1,200 mg by mouth daily.    [provider]  pantoprazole (PROTONIX) 40 MG tablet Take 40 mg by mouth daily.    [provider]    Allergies Patient has no known allergies.  Family History  Problem Relation Age of Onset  . Diabetes  Mother   . Cancer Father        colon  . Breast cancer Neg Hx     Social History Social History  Substance Use Topics  . Smoking status: Never Smoker  . Smokeless tobacco: Never Used  . Alcohol use No    Review of Systems  Constitutional: No fever/chills Eyes: No visual changes. ENT: No sore throat. Cardiovascular:  chest pain. Respiratory: Denies shortness of breath. Gastrointestinal: No abdominal pain.  No nausea, no vomiting.  No diarrhea.  No constipation. Genitourinary: Negative for dysuria. Musculoskeletal: Negative for back pain. Skin: Negative for rash. Neurological: Dizziness with no headaches or numbness   ____________________________________________   PHYSICAL EXAM:  VITAL SIGNS: ED Triage Vitals  Enc Vitals Group     BP 01/21/17 1919 (!) 145/63     Pulse Rate 01/21/17 1919 67     Resp 01/21/17 1919 19     Temp 01/21/17 1919 97.8 F (36.6 C)     Temp Source 01/21/17 1919 Oral     SpO2 01/21/17 1919 100 %     Weight 01/21/17 1916 129 lb (58.5 kg)     Height 01/21/17 1916 5\' 6"  (1.676 m)     Head Circumference --      Peak Flow --      Pain Score 01/21/17 1915 8     Pain Loc --      Pain Edu? --      Excl. in [place]? --     Constitutional: Alert and oriented. Well appearing and in Mild distress. Eyes: Conjunctivae are normal. PERRL. EOMI. Head: Atraumatic. Nose: No congestion/rhinnorhea. Mouth/Throat: Mucous membranes are moist.  Oropharynx non-erythematous. Cardiovascular: Normal rate, regular rhythm. Grossly normal heart sounds.  Good peripheral circulation. Respiratory: Normal respiratory effort.  No retractions. Lungs CTAB. Gastrointestinal: Soft and nontender. No distention. Positive bowel sounds Musculoskeletal: No lower extremity tenderness nor edema.   Neurologic:  Normal speech and language.  Skin:  Skin is warm, dry and intact.  Psychiatric: Mood and affect are normal.   ____________________________________________   LABS (all labs  ordered are listed, but only abnormal results are displayed)  Labs Reviewed  BASIC METABOLIC PANEL - Abnormal; Notable for the following:       Result Value   Glucose, Bld 179 (*)    BUN 38 (*)    Creatinine, Ser 1.40 (*)    GFR calc non Af Amer 35 (*)    GFR calc Af Amer 41 (*)    All other components within normal limits  GLUCOSE, CAPILLARY - Abnormal; Notable for the following:    Glucose-Capillary 201 (*)    All other components within normal limits  CBC  TROPONIN I  TROPONIN I  CBG MONITORING, ED   ____________________________________________  EKG  ED ECG REPORT I, Branndon Tuite,  Eduard Roux, the attending physician, personally viewed and interpreted this ECG.   Date: 01/21/2017  EKG Time: 1914  Rate: 75  Rhythm: normal sinus rhythm  Axis: normal  Intervals:none  ST&T Change: none  ____________________________________________  RADIOLOGY  CXR ____________________________________________   PROCEDURES  Procedure(s) performed: None  Procedures  Critical Care performed: No  ____________________________________________   INITIAL IMPRESSION / ASSESSMENT AND PLAN / ED COURSE  Pertinent labs & imaging results that were available during my care of the patient were reviewed by me and considered in my medical decision making (see chart for details).  This is a 76 year old female who comes into the hospital today with some chest pain. The patient reports that after eating her pain does feel improved. I did give her some GI cocktail as well as nitroglycerin. The patient did receive aspirin by EMS. I did repeat the patient's troponin that was unremarkable. The patient's chest x-ray is unremarkable as well. I will reassess the patient and determine if her pain is improved. If it is I will have the patient follow-up with her cardiologist Dr. Fletcher Anon.  Clinical Course as of Jan 22 121  Tue Jan 22, 2017  0039 No acute abnormality noted. DG Chest 2 View [AW]    Clinical  Course User Index [AW] Loney Hering, MD    The patient reports that her pain is gone at this time. She'll be discharged to home to follow-up with Dr. Fletcher Anon. ____________________________________________   FINAL CLINICAL IMPRESSION(S) / ED DIAGNOSES  Final diagnoses:  Chest pain, unspecified type      NEW MEDICATIONS STARTED DURING THIS VISIT:  New Prescriptions   No medications on file     Note:  This document was prepared using Dragon voice recognition software and may include unintentional dictation errors.    Loney Hering, MD 01/22/17 [phone]

## 2017-01-22 NOTE — Discharge Instructions (Signed)
Please follow-up with your cardiologist.

## 2017-01-22 NOTE — ED Notes (Signed)
MD Webster at bedside 

## 2017-01-23 ENCOUNTER — Other Ambulatory Visit: Payer: Self-pay | Admitting: Family Medicine

## 2017-01-25 ENCOUNTER — Ambulatory Visit
Admission: RE | Admit: 2017-01-25 | Discharge: 2017-01-25 | Disposition: A | Discharge status: Home / Self Care | Payer: Medicare Other | Source: Ambulatory Visit | Attending: Nurse Practitioner | Admitting: Nurse Practitioner

## 2017-01-25 DIAGNOSIS — R079 Chest pain, unspecified: Secondary | ICD-10-CM | POA: Diagnosis not present

## 2017-01-25 LAB — NM MYOCAR MULTI W/SPECT W/WALL MOTION / EF
CHL CUP NUCLEAR SSS: 13
CHL CUP RESTING HR STRESS: 71 {beats}/min
CSEPEDS: 0 s
CSEPEW: 1 METS
CSEPHR: 80 %
Exercise duration (min): 0 min
LV dias vol: 44 mL (ref 46–106)
LV sys vol: 18 mL
MPHR: 142 {beats}/min
Peak HR: 115 {beats}/min
SDS: 0
SRS: 0
TID: 0.95

## 2017-01-25 MED ORDER — REGADENOSON 0.4 MG/5ML IV SOLN
0.4000 mg | Freq: Once | INTRAVENOUS | Status: AC
  Administered 2017-01-25: 0.4 mg via INTRAVENOUS

## 2017-01-25 MED ORDER — TECHNETIUM TC 99M TETROFOSMIN IV KIT
30.0000 | PACK | Freq: Once | INTRAVENOUS | Status: AC | PRN
  Administered 2017-01-25: 31.68 via INTRAVENOUS

## 2017-01-25 MED ORDER — TECHNETIUM TC 99M TETROFOSMIN IV KIT
13.0000 | PACK | Freq: Once | INTRAVENOUS | Status: AC | PRN
  Administered 2017-01-25: 12.898 via INTRAVENOUS

## 2017-02-05 ENCOUNTER — Other Ambulatory Visit: Payer: Self-pay | Admitting: Family Medicine

## 2017-02-14 ENCOUNTER — Other Ambulatory Visit: Payer: Self-pay | Admitting: Family Medicine

## 2017-02-14 DIAGNOSIS — Z1231 Encounter for screening mammogram for malignant neoplasm of breast: Secondary | ICD-10-CM

## 2017-02-25 ENCOUNTER — Other Ambulatory Visit: Payer: Self-pay

## 2017-02-25 MED ORDER — BENAZEPRIL HCL 5 MG PO TABS: 2.5000 mg | ORAL_TABLET | Freq: Every day | ORAL | 3 refills | Status: DC

## 2017-02-25 NOTE — Telephone Encounter (Signed)
Patient is taking 2.5 per office visit note, please change rx last filled 05/09/16

## 2017-02-27 ENCOUNTER — Other Ambulatory Visit: Payer: Self-pay | Admitting: Family Medicine

## 2017-03-02 ENCOUNTER — Other Ambulatory Visit: Payer: Self-pay | Admitting: Family Medicine

## 2017-03-07 ENCOUNTER — Encounter: Payer: Self-pay | Admitting: Family Medicine

## 2017-03-07 ENCOUNTER — Ambulatory Visit (INDEPENDENT_AMBULATORY_CARE_PROVIDER_SITE_OTHER): Payer: Medicare Other | Admitting: Family Medicine

## 2017-03-07 VITALS — BP 142/72 | HR 73 | Temp 98.1°F | Wt 127.8 lb

## 2017-03-07 DIAGNOSIS — I10 Essential (primary) hypertension: Secondary | ICD-10-CM

## 2017-03-07 DIAGNOSIS — R109 Unspecified abdominal pain: Secondary | ICD-10-CM

## 2017-03-07 DIAGNOSIS — E118 Type 2 diabetes mellitus with unspecified complications: Secondary | ICD-10-CM | POA: Diagnosis not present

## 2017-03-07 DIAGNOSIS — I6529 Occlusion and stenosis of unspecified carotid artery: Secondary | ICD-10-CM | POA: Diagnosis not present

## 2017-03-07 DIAGNOSIS — Z794 Long term (current) use of insulin: Secondary | ICD-10-CM

## 2017-03-07 DIAGNOSIS — E782 Mixed hyperlipidemia: Secondary | ICD-10-CM | POA: Diagnosis not present

## 2017-03-07 NOTE — Progress Notes (Signed)
  Tommi Rumps, MD Phone: [phone]  Laurie Harris is a 76 y.o. female who presents today for f/u.  HYPERTENSION Disease Monitoring: Blood pressure range-90s-120s/45-67, mostly in the 86L diastolically Chest pain- no      Dyspnea- no Medications: Compliance- taking benazepril Lightheadedness-no   Edema- no  DIABETES Disease Monitoring: Blood Sugar ranges-93-220, mostly <160 Polyuria/phagia/dipsia- no       Medications: Compliance- taking 75/25 17 u BID Hypoglycemic symptoms- no  HYPERLIPIDEMIA Disease Monitoring: See symptoms for Hypertension Medications: Compliance- taking lipitor Right upper quadrant pain- no  Muscle aches- no  Patient does note rare intermittent twinges in her right lower abdomen. She does have a history of appendicitis. The twinges go away as quickly as they come on. They do not persist. She reports no other symptoms with them.   PMH: nonsmoker   ROS see history of present illness  Objective  Physical Exam Vitals:   03/07/17 1013 03/07/17 1030  BP: (!) 150/78 (!) 142/72  Pulse: 73   Temp: 98.1 F (36.7 C)     BP Readings from Last 3 Encounters:  03/07/17 (!) 142/72  01/22/17 (!) 154/78  01/22/17 (!) 147/78   Wt Readings from Last 3 Encounters:  03/07/17 127 lb 12.8 oz (58 kg)  01/22/17 125 lb (56.7 kg)  01/21/17 129 lb (58.5 kg)    Physical Exam  Constitutional: No distress.  Cardiovascular: Normal rate, regular rhythm and normal heart sounds.   Pulmonary/Chest: Effort normal and breath sounds normal.  Abdominal: Soft. Bowel sounds are normal. She exhibits no distension. There is no tenderness. There is no rebound and no guarding.  Musculoskeletal: She exhibits no edema.  Neurological: She is alert. Gait normal.  Skin: Skin is warm and dry. She is not diaphoretic.     Assessment/Plan: Please see individual problem list.  Abdominal pain Rare twinges. Nothing persistent. Benign abdominal exam. Discussed monitoring and if  develops any persistent symptoms being evaluated.  Hyperlipidemia Continue Lipitor. Check CMP with labs.  Diabetes mellitus type 2, controlled, with complications Seems to be well-controlled. Continue current regimen. Check A1c in 1 week.  Benign essential HTN At goal at home. Continues to have slightly low diastolics. She'll follow-up with cardiology as planned. No changes to medications.   Orders Placed This Encounter  Procedures  . Comp Met (CMET)    Standing Status:   Future    Standing Expiration Date:   03/07/2018  . HgB A1c    Standing Status:   Future    Standing Expiration Date:   03/07/2018   Tommi Rumps, MD Tensed

## 2017-03-07 NOTE — Assessment & Plan Note (Signed)
Rare twinges. Nothing persistent. Benign abdominal exam. Discussed monitoring and if develops any persistent symptoms being evaluated.

## 2017-03-07 NOTE — Assessment & Plan Note (Signed)
At goal at home. Continues to have slightly low diastolics. She'll follow-up with cardiology as planned. No changes to medications.

## 2017-03-07 NOTE — Assessment & Plan Note (Signed)
Continue Lipitor. Check CMP with labs.

## 2017-03-07 NOTE — Patient Instructions (Signed)
Nice to see you. We'll have you return in about a week for lab work. Please continue to monitor your blood pressure and your sugars. If you have persistent abdominal discomfort please be evaluated.

## 2017-03-07 NOTE — Assessment & Plan Note (Signed)
Seems to be well-controlled. Continue current regimen. Check A1c in 1 week.

## 2017-03-08 ENCOUNTER — Other Ambulatory Visit: Payer: Self-pay | Admitting: Family Medicine

## 2017-03-13 ENCOUNTER — Other Ambulatory Visit: Payer: Self-pay

## 2017-03-13 MED ORDER — INSULIN PEN NEEDLE 32G X 4 MM MISC: 6 refills | Status: DC

## 2017-03-13 NOTE — Telephone Encounter (Signed)
Received refill request from pharmacy, last OV 03/07/17 last filled by Dr.Walker 03/15/16 100 6rf

## 2017-03-15 ENCOUNTER — Other Ambulatory Visit (INDEPENDENT_AMBULATORY_CARE_PROVIDER_SITE_OTHER): Payer: Medicare Other

## 2017-03-15 DIAGNOSIS — Z794 Long term (current) use of insulin: Secondary | ICD-10-CM | POA: Diagnosis not present

## 2017-03-15 DIAGNOSIS — E118 Type 2 diabetes mellitus with unspecified complications: Secondary | ICD-10-CM

## 2017-03-15 LAB — COMPREHENSIVE METABOLIC PANEL
ALK PHOS: 57 U/L (ref 39–117)
ALT: 24 U/L (ref 0–35)
AST: 28 U/L (ref 0–37)
Albumin: 4 g/dL (ref 3.5–5.2)
BILIRUBIN TOTAL: 0.5 mg/dL (ref 0.2–1.2)
BUN: 34 mg/dL — AB (ref 6–23)
CO2: 26 meq/L (ref 19–32)
Calcium: 9.9 mg/dL (ref 8.4–10.5)
Chloride: 104 mEq/L (ref 96–112)
Creatinine, Ser: 1.23 mg/dL — ABNORMAL HIGH (ref 0.40–1.20)
GFR: 44.83 mL/min — ABNORMAL LOW (ref >60.00)
GLUCOSE: 173 mg/dL — AB (ref 70–99)
Potassium: 4.4 mEq/L (ref 3.5–5.1)
SODIUM: 138 meq/L (ref 135–145)
TOTAL PROTEIN: 6.7 g/dL (ref 6.0–8.3)

## 2017-03-15 LAB — HEMOGLOBIN A1C: Hgb A1c MFr Bld: 7.3 % — ABNORMAL HIGH (ref 4.6–6.5)

## 2017-03-18 ENCOUNTER — Ambulatory Visit
Admission: RE | Admit: 2017-03-18 | Discharge: 2017-03-18 | Disposition: A | Discharge status: Home / Self Care | Payer: Medicare Other | Source: Ambulatory Visit | Attending: Family Medicine | Admitting: Family Medicine

## 2017-03-18 DIAGNOSIS — Z1231 Encounter for screening mammogram for malignant neoplasm of breast: Secondary | ICD-10-CM

## 2017-03-22 ENCOUNTER — Other Ambulatory Visit: Payer: Self-pay | Admitting: Family Medicine

## 2017-04-18 ENCOUNTER — Other Ambulatory Visit: Payer: Self-pay | Admitting: Family Medicine

## 2017-04-18 MED ORDER — INSULIN LISPRO PROT & LISPRO (75-25 MIX) 100 UNIT/ML KWIKPEN: PEN_INJECTOR | SUBCUTANEOUS | 2 refills | Status: DC

## 2017-04-26 ENCOUNTER — Encounter: Payer: Self-pay | Admitting: Cardiovascular Disease

## 2017-04-26 ENCOUNTER — Ambulatory Visit (INDEPENDENT_AMBULATORY_CARE_PROVIDER_SITE_OTHER): Payer: Medicare Other | Admitting: Cardiovascular Disease

## 2017-04-26 VITALS — BP 152/68 | HR 77 | Ht 66.0 in | Wt 127.8 lb

## 2017-04-26 DIAGNOSIS — E782 Mixed hyperlipidemia: Secondary | ICD-10-CM | POA: Diagnosis not present

## 2017-04-26 DIAGNOSIS — I6529 Occlusion and stenosis of unspecified carotid artery: Secondary | ICD-10-CM | POA: Diagnosis not present

## 2017-04-26 DIAGNOSIS — I779 Disorder of arteries and arterioles, unspecified: Secondary | ICD-10-CM

## 2017-04-26 DIAGNOSIS — I10 Essential (primary) hypertension: Secondary | ICD-10-CM | POA: Diagnosis not present

## 2017-04-26 DIAGNOSIS — R079 Chest pain, unspecified: Secondary | ICD-10-CM | POA: Diagnosis not present

## 2017-04-26 DIAGNOSIS — I739 Peripheral vascular disease, unspecified: Secondary | ICD-10-CM

## 2017-04-26 NOTE — Progress Notes (Signed)
Cardiology Office Note   Date:  04/26/2017   ID:  Laurie, Harris September 19, 1940, MRN [id number]  PCP:  [name], MD  Cardiologist:   Kathlyn Sacramento, MD   Chief Complaint  Patient presents with  . other    Follow up from stress test Patient states she is well. Meds reviewed verbally with patient.       History of Present Illness: Laurie Harris is a 76 y.o. female who Is here today for a follow-up visit regarding previous syncope, chest pain and moderate bilateral carotid stenosis. She has known history of essential hypertension, hyperlipidemia, intracranial hemorrhage while on warfarin, chronic kidney disease, diabetes mellitus and carotid artery disease. She had a syncopal episode earlier this year which was felt to be vasovagal with a background of volume depletion.. Carotid Doppler showed moderate bilateral carotid disease worse on the right side at 50-69%. Echocardiogram showed normal LV systolic function with grade 1 diastolic dysfunction.  No recurrent syncope. However, she had atypical chest pain a few months ago. She underwent a pharmacologic nuclear stress test which was normal. She reports no further episodes of chest pain. Enalapril was decreased recently due to low blood pressure in the morning. Her blood pressure tends to run high during the day.     Past Medical History:  Diagnosis Date  . Carotid arterial disease ([place])    a. 09/2016 Carotid U/S: R carotid bifurcation dzs of 50-69%, L carotid bifurcation dzs of <50%. Patent vertebral arteries w/ anegrade flow.  . Chicken pox   . CKD (chronic kidney disease), stage III   . Diabetes mellitus without complication ([place])   . Hemorrhagic stroke ([place]) 2016   a. in the setting of warfarin therapy.  . Hyperlipidemia   . Hypertension   . Syncope    a. 09/2016 Echo: EF 55-60%, no rwma, Gr1 DD.    Past Surgical History:  Procedure Laterality Date  . ABCESS DRAINAGE  06/14/2016   appendix  . NECK SURGERY        Current Outpatient Prescriptions  Medication Sig Dispense Refill  . alendronate (FOSAMAX) 70 MG tablet TAKE 1 TABLET EVERY 7 DAYS WITH A FULL GLASS OF WATER ON AN EMPTY STOMACH DO NOT LIE DOWN FOR AT LEAST 30 MIN 4 tablet 1  . aspirin EC 81 MG tablet Take 81 mg by mouth daily.    Marland Kitchen atorvastatin (LIPITOR) 80 MG tablet TAKE ONE TABLET EVERY DAY 90 tablet 2  . benazepril (LOTENSIN) 5 MG tablet TAKE TWO TABLETS BY MOUTH EVERY DAY (Patient taking differently: Take 2.5mg  nighlty.) 180 tablet 0  . calcium-vitamin D (OSCAL WITH D) 500-200 MG-UNIT tablet Take 1 tablet by mouth 2 (two) times daily.    . cholecalciferol (VITAMIN D) 1000 units tablet Take 2,000 Units by mouth daily.    . fenofibrate 160 MG tablet Take 160 mg by mouth at bedtime.    . Insulin Lispro Prot & Lispro (HUMALOG MIX 75/25 KWIKPEN) (75-25) 100 UNIT/ML Kwikpen INJECT 19 units in the a.m. and 18 units in the p.m. AS DIRECTED 15 mL 2  . Insulin Pen Needle (NOVOFINE PLUS) 32G X 4 MM MISC USE TWICE DAILY AS DIRECTED 100 each 6  . Multiple Vitamin (MULTIVITAMIN WITH MINERALS) TABS tablet Take 1 tablet by mouth daily.    . Omega-3 Fatty Acids (FISH OIL) 1200 MG CAPS Take 1,200 mg by mouth daily.    . pantoprazole (PROTONIX) 40 MG tablet TAKE 1 TABLET BY MOUTH DAILY  90 tablet 2   No current facility-administered medications for this visit.     Allergies:   Patient has no known allergies.    Social History:  The patient  reports that she has never smoked. She has never used smokeless tobacco. She reports that she does not drink alcohol or use drugs.   Family History:  The patient's family history includes Cancer in her father; Diabetes in her mother.    ROS:  Please see the history of present illness.   Otherwise, review of systems are positive for none.   All other systems are reviewed and negative.    PHYSICAL EXAM: VS:  BP (!) 152/68 (BP Location: Right Arm, Patient Position: Sitting, Cuff Size: Normal)   Pulse 77    Ht 5\' 6"  (1.676 m)   Wt 127 lb 12 oz (57.9 kg)   BMI 20.62 kg/m  , BMI Body mass index is 20.62 kg/m. GEN: Well nourished, well developed, in no acute distress  HEENT: normal  Neck: no JVD, or masses. Bilateral carotid bruits  Cardiac: RRR; no murmurs, rubs, or gallops,no edema  Respiratory:  clear to auscultation bilaterally, normal work of breathing GI: soft, nontender, nondistended, + BS MS: no deformity or atrophy  Skin: warm and dry, no rash Neuro:  Strength and sensation are intact Psych: euthymic mood, full affect   EKG:  EKG is ordered today. The ekg ordered today demonstrates normal sinus rhythm with no significant ST or T wave changes. Borderline prolonged QT.   Recent Labs: 09/11/2016: Magnesium 1.6; TSH 0.953 01/21/2017: Hemoglobin 13.1; Platelets 223 03/15/2017: ALT 24; BUN 34; Creatinine, Ser 1.23; Potassium 4.4; Sodium 138    Lipid Panel    Component Value Date/Time   CHOL 122 09/11/2016 0540   TRIG 154 (H) 09/11/2016 0540   HDL 27 (L) 09/11/2016 0540   CHOLHDL 4.5 09/11/2016 0540   VLDL 31 09/11/2016 0540   LDLCALC 64 09/11/2016 0540      Wt Readings from Last 3 Encounters:  04/26/17 127 lb 12 oz (57.9 kg)  03/07/17 127 lb 12.8 oz (58 kg)  01/22/17 125 lb (56.7 kg)       PAD Screen 10/15/2016  Previous PAD dx? No  Previous surgical procedure? No  Pain with walking? No  Feet/toe relief with dangling? No  Painful, non-healing ulcers? No  Extremities discolored? No      ASSESSMENT AND PLAN:  1.  Recent atypical chest pain with negative stress test. No recurrent episodes. No further workup is needed.  2. Previous vasovagal syncope: No recurrent episodes.  3. Bilateral carotid disease: This was moderate. Repeat carotid Doppler in January 2019.  4. Hyperlipidemia: Currently on high dose atorvastatin and fenofibrate. Most recent lipid profile showed an LDL of 64 and triglyceride of 154.  5. Essential hypertension: The pressure is mildly  elevated. Consider increasing help her again or taking it twice daily.  Disposition:   FU with me in 1 year  Signed,  Kathlyn Sacramento, MD  04/26/2017 3:12 PM    [place]

## 2017-04-26 NOTE — Patient Instructions (Signed)
Medication Instructions:  Your physician recommends that you continue on your current medications as directed. Please refer to the Current Medication list given to you today.   Labwork: none  Testing/Procedures: Your physician has requested that you have a carotid duplex in January 2019. This test is an ultrasound of the carotid arteries in your neck. It looks at blood flow through these arteries that supply the brain with blood. Allow one hour for this exam. There are no restrictions or special instructions.    Follow-Up: Your physician wants you to follow-up in: one year with Dr. Fletcher Anon.  You will receive a reminder letter in the mail two months in advance. If you don't receive a letter, please call our office to schedule the follow-up appointment.   Any Other Special Instructions Will Be Listed Below (If Applicable).     If you need a refill on your cardiac medications before your next appointment, please call your pharmacy.

## 2017-05-07 DIAGNOSIS — E1122 Type 2 diabetes mellitus with diabetic chronic kidney disease: Secondary | ICD-10-CM | POA: Diagnosis not present

## 2017-05-07 DIAGNOSIS — I10 Essential (primary) hypertension: Secondary | ICD-10-CM | POA: Diagnosis not present

## 2017-05-07 DIAGNOSIS — N183 Chronic kidney disease, stage 3 (moderate): Secondary | ICD-10-CM | POA: Diagnosis not present

## 2017-05-07 DIAGNOSIS — E559 Vitamin D deficiency, unspecified: Secondary | ICD-10-CM | POA: Diagnosis not present

## 2017-05-27 ENCOUNTER — Other Ambulatory Visit: Payer: Self-pay | Admitting: Family Medicine

## 2017-06-17 ENCOUNTER — Ambulatory Visit (INDEPENDENT_AMBULATORY_CARE_PROVIDER_SITE_OTHER): Payer: Medicare Other | Admitting: Family Medicine

## 2017-06-17 ENCOUNTER — Encounter: Payer: Self-pay | Admitting: Family Medicine

## 2017-06-17 VITALS — BP 136/62 | HR 73 | Temp 97.7°F | Wt 126.8 lb

## 2017-06-17 DIAGNOSIS — I10 Essential (primary) hypertension: Secondary | ICD-10-CM | POA: Diagnosis not present

## 2017-06-17 DIAGNOSIS — R1030 Lower abdominal pain, unspecified: Secondary | ICD-10-CM | POA: Diagnosis not present

## 2017-06-17 DIAGNOSIS — E782 Mixed hyperlipidemia: Secondary | ICD-10-CM | POA: Diagnosis not present

## 2017-06-17 DIAGNOSIS — Z794 Long term (current) use of insulin: Secondary | ICD-10-CM

## 2017-06-17 DIAGNOSIS — E118 Type 2 diabetes mellitus with unspecified complications: Secondary | ICD-10-CM

## 2017-06-17 DIAGNOSIS — I6529 Occlusion and stenosis of unspecified carotid artery: Secondary | ICD-10-CM

## 2017-06-17 LAB — CBC
HCT: 37.4 % (ref 36.0–46.0)
Hemoglobin: 12.5 g/dL (ref 12.0–15.0)
MCHC: 33.5 g/dL (ref 30.0–36.0)
MCV: 103.4 fl — ABNORMAL HIGH (ref 78.0–100.0)
PLATELETS: 206 10*3/uL (ref 150.0–400.0)
RBC: 3.61 Mil/uL — AB (ref 3.87–5.11)
RDW: 13.4 % (ref 11.5–15.5)
WBC: 5.6 10*3/uL (ref 4.0–10.5)

## 2017-06-17 LAB — HEMOGLOBIN A1C: HEMOGLOBIN A1C: 7.1 % — AB (ref 4.6–6.5)

## 2017-06-17 LAB — SEDIMENTATION RATE: Sed Rate: 3 mm/hr (ref 0–30)

## 2017-06-17 NOTE — Assessment & Plan Note (Addendum)
Continues to have issues with this. She has a benign abdominal exam today. She did have appendicitis previously that was treated with drainage. This is not consistent with appendicitis based on exam and history. She really has no other symptoms. Given persistence we will repeat CT abdomen pelvis. We'll check a CBC and a sedimentation rate as well.

## 2017-06-17 NOTE — Assessment & Plan Note (Signed)
-  Continue Lipitor °

## 2017-06-17 NOTE — Progress Notes (Signed)
  Laurie Rumps, MD Phone: [phone]  BRYTNI Harris is a 76 y.o. female who presents today for follow-up.  Hypertension: Typically in the 100-120/49-65. Taking benazepril 2.5 mg at night. No chest pain, shortness breath, edema, or lightheadedness.  Diabetes: Typically 120-170s with excursions to 220. Taking 75/25 Humalog 19 units in the morning and 18 units at night. No polyuria or polydipsia. No glycemia.  Hyperlipidemia: Taking Lipitor. No myalgias or right upper quadrant pain.  She continues to have intermittent lower abdominal discomfort. This has been going on since she had appendicitis previously. It'll be a light discomfort that goes away relatively quickly. She has a bowel movement daily. She has no diarrhea, nausea, vomiting, blood in her stool, or dysuria. She did not have an appendectomy. She has no pain currently.  PMH: nonsmoker.   ROS see history of present illness  Objective  Physical Exam Vitals:   06/17/17 1040 06/17/17 1107  BP: (!) 160/72 136/62  Pulse: 73   Temp: 97.7 F (36.5 C)   SpO2: 98%     BP Readings from Last 3 Encounters:  06/17/17 136/62  04/26/17 (!) 152/68  03/07/17 (!) 142/72   Wt Readings from Last 3 Encounters:  06/17/17 126 lb 12.8 oz (57.5 kg)  04/26/17 127 lb 12 oz (57.9 kg)  03/07/17 127 lb 12.8 oz (58 kg)    Physical Exam  Constitutional: No distress.  Cardiovascular: Normal rate, regular rhythm and normal heart sounds.   Pulmonary/Chest: Effort normal and breath sounds normal.  Abdominal: Soft. Bowel sounds are normal. She exhibits no distension. There is no tenderness. There is no rebound and no guarding.  Musculoskeletal: She exhibits no edema.  Neurological: She is alert. Gait normal.  Skin: Skin is warm and dry. She is not diaphoretic.     Assessment/Plan: Please see individual problem list.  Benign essential HTN Well-controlled. Improved on recheck. Continue current regimen. She will start checking throughout  the day.  Diabetes mellitus type 2, controlled, with complications Check T3S. Continue current regimen.  Hyperlipidemia Continue Lipitor.  Abdominal pain Continues to have issues with this. She has a benign abdominal exam today. She did have appendicitis previously that was treated with drainage. This is not consistent with appendicitis based on exam and history. She really has no other symptoms. Given persistence we will repeat CT abdomen pelvis. We'll check a CBC and a sedimentation rate as well.   Orders Placed This Encounter  Procedures  . CT Abdomen Pelvis W Contrast    Standing Status:   Future    Standing Expiration Date:   09/17/2018    Order Specific Question:   If indicated for the ordered procedure, I authorize the administration of contrast media per Radiology protocol    Answer:   Yes    Order Specific Question:   Preferred imaging location?    Answer:    [facility]    Order Specific Question:   Radiology Contrast Protocol - do NOT remove file path    Answer:   [email or web address]  . CBC  . Sedimentation rate  . Hemoglobin A1c    Laurie Rumps, MD [place]

## 2017-06-17 NOTE — Patient Instructions (Signed)
Nice to see you. We will get some blood work today and contact you with the results. We'll get you set up for CT scan of your abdomen and pelvis as well. If you have persistent abdominal pain please be evaluated.

## 2017-06-17 NOTE — Assessment & Plan Note (Signed)
Check A1c.  Continue current regimen. 

## 2017-06-17 NOTE — Assessment & Plan Note (Signed)
Well-controlled. Improved on recheck. Continue current regimen. She will start checking throughout the day.

## 2017-06-26 ENCOUNTER — Telehealth: Payer: Self-pay | Admitting: Radiology

## 2017-06-26 ENCOUNTER — Other Ambulatory Visit (INDEPENDENT_AMBULATORY_CARE_PROVIDER_SITE_OTHER): Payer: Medicare Other

## 2017-06-26 DIAGNOSIS — D7589 Other specified diseases of blood and blood-forming organs: Secondary | ICD-10-CM

## 2017-06-26 LAB — CBC
HCT: 36.6 % (ref 36.0–46.0)
Hemoglobin: 12.3 g/dL (ref 12.0–15.0)
MCHC: 33.5 g/dL (ref 30.0–36.0)
MCV: 103 fl — ABNORMAL HIGH (ref 78.0–100.0)
Platelets: 224 10*3/uL (ref 150.0–400.0)
RBC: 3.55 Mil/uL — AB (ref 3.87–5.11)
RDW: 13.5 % (ref 11.5–15.5)
WBC: 6 10*3/uL (ref 4.0–10.5)

## 2017-06-26 NOTE — Telephone Encounter (Signed)
Order placed. It appears that medicare will not cover the b12. It appears the cost to the patient may be $38. I think it would be reasonable to check the cbc again and if still abnormal consider obtaining the B12. Please let the patient know this. Thanks.

## 2017-06-26 NOTE — Addendum Note (Signed)
Addended by: [name] on: 06/26/2017 08:46 AM   Modules accepted: Orders

## 2017-06-26 NOTE — Telephone Encounter (Signed)
Pt coming in for labs today , please place future orders. Thank you.  

## 2017-06-28 ENCOUNTER — Ambulatory Visit: Payer: Medicare Other

## 2017-06-28 ENCOUNTER — Ambulatory Visit
Admission: RE | Admit: 2017-06-28 | Discharge: 2017-06-28 | Disposition: A | Discharge status: Home / Self Care | Payer: Medicare Other | Source: Ambulatory Visit | Attending: Family Medicine | Admitting: Family Medicine

## 2017-06-28 ENCOUNTER — Other Ambulatory Visit: Payer: Self-pay | Admitting: Family Medicine

## 2017-06-28 DIAGNOSIS — K5730 Diverticulosis of large intestine without perforation or abscess without bleeding: Secondary | ICD-10-CM | POA: Diagnosis not present

## 2017-06-28 DIAGNOSIS — R1030 Lower abdominal pain, unspecified: Secondary | ICD-10-CM | POA: Diagnosis not present

## 2017-06-28 DIAGNOSIS — I2510 Atherosclerotic heart disease of native coronary artery without angina pectoris: Secondary | ICD-10-CM | POA: Insufficient documentation

## 2017-06-28 DIAGNOSIS — D7589 Other specified diseases of blood and blood-forming organs: Secondary | ICD-10-CM

## 2017-06-28 DIAGNOSIS — K760 Fatty (change of) liver, not elsewhere classified: Secondary | ICD-10-CM | POA: Insufficient documentation

## 2017-06-28 DIAGNOSIS — K8020 Calculus of gallbladder without cholecystitis without obstruction: Secondary | ICD-10-CM | POA: Diagnosis not present

## 2017-06-28 LAB — POCT I-STAT CREATININE: CREATININE: 1.2 mg/dL — AB (ref 0.44–1.00)

## 2017-06-28 MED ORDER — IOPAMIDOL (ISOVUE-300) INJECTION 61%
80.0000 mL | Freq: Once | INTRAVENOUS | Status: AC | PRN
  Administered 2017-06-28: 80 mL via INTRAVENOUS

## 2017-06-29 ENCOUNTER — Other Ambulatory Visit: Payer: Self-pay | Admitting: Family Medicine

## 2017-06-29 DIAGNOSIS — K760 Fatty (change of) liver, not elsewhere classified: Secondary | ICD-10-CM

## 2017-06-29 DIAGNOSIS — I708 Atherosclerosis of other arteries: Principal | ICD-10-CM

## 2017-06-29 DIAGNOSIS — I700 Atherosclerosis of aorta: Secondary | ICD-10-CM

## 2017-07-01 ENCOUNTER — Ambulatory Visit (INDEPENDENT_AMBULATORY_CARE_PROVIDER_SITE_OTHER): Payer: Medicare Other

## 2017-07-01 VITALS — BP 138/72 | HR 66 | Temp 97.9°F | Resp 14 | Ht 66.0 in | Wt 127.8 lb

## 2017-07-01 DIAGNOSIS — D7589 Other specified diseases of blood and blood-forming organs: Secondary | ICD-10-CM

## 2017-07-01 DIAGNOSIS — Z0000 Encounter for general adult medical examination without abnormal findings: Secondary | ICD-10-CM | POA: Diagnosis not present

## 2017-07-01 LAB — FOLATE: Folate: >23.9 ng/mL (ref >5.9)

## 2017-07-01 LAB — VITAMIN B12: Vitamin B-12: 519 pg/mL (ref 211–911)

## 2017-07-01 NOTE — Progress Notes (Signed)
Subjective:   Laurie Harris is a 76 y.o. female who presents for an Initial Medicare Annual Wellness Visit.  Review of Systems    No ROS.  Medicare Wellness Visit. Additional risk factors are reflected in the social history.  Cardiac Risk Factors include: hypertension;diabetes mellitus;advanced age (>82men, >15 women)     Objective:    Today's Vitals   07/01/17 1055  BP: 138/72  Pulse: 66  Resp: 14  Temp: 97.9 F (36.6 C)  TempSrc: Oral  SpO2: 98%  Weight: 127 lb 12.8 oz (58 kg)  Height: 5\' 6"  (1.676 m)   Body mass index is 20.63 kg/m.   Current Medications (verified) Outpatient Encounter Prescriptions as of 07/01/2017  Medication Sig  . alendronate (FOSAMAX) 70 MG tablet TAKE 1 TABLET EVERY 7 DAYS WITH A FULL GLASS OF WATER ON AN EMPTY STOMACH DO NOT LIE DOWN FOR AT LEAST 30 MIN  . aspirin EC 81 MG tablet Take 81 mg by mouth daily.  Marland Kitchen atorvastatin (LIPITOR) 80 MG tablet TAKE ONE TABLET EVERY DAY  . benazepril (LOTENSIN) 5 MG tablet TAKE TWO TABLETS BY MOUTH EVERY DAY (Patient taking differently: Take 2.5mg  nighlty.)  . calcium-vitamin D (OSCAL WITH D) 500-200 MG-UNIT tablet Take 1 tablet by mouth 2 (two) times daily.  . cholecalciferol (VITAMIN D) 1000 units tablet Take 2,000 Units by mouth daily.  . fenofibrate 160 MG tablet Take 160 mg by mouth at bedtime.  . Insulin Lispro Prot & Lispro (HUMALOG MIX 75/25 KWIKPEN) (75-25) 100 UNIT/ML Kwikpen INJECT 19 units in the a.m. and 18 units in the p.m. AS DIRECTED  . Insulin Pen Needle (NOVOFINE PLUS) 32G X 4 MM MISC USE TWICE DAILY AS DIRECTED  . Multiple Vitamin (MULTIVITAMIN WITH MINERALS) TABS tablet Take 1 tablet by mouth daily.  . Omega-3 Fatty Acids (FISH OIL) 1200 MG CAPS Take 1,200 mg by mouth daily.  . pantoprazole (PROTONIX) 40 MG tablet TAKE 1 TABLET BY MOUTH DAILY   No facility-administered encounter medications on file as of 07/01/2017.     Allergies (verified) Warfarin and related   History: Past  Medical History:  Diagnosis Date  . Carotid arterial disease ([place])    a. 09/2016 Carotid U/S: R carotid bifurcation dzs of 50-69%, L carotid bifurcation dzs of <50%. Patent vertebral arteries w/ anegrade flow.  . Chicken pox   . CKD (chronic kidney disease), stage III ([place])   . Diabetes mellitus without complication ([place])   . Hemorrhagic stroke ([place]) 2016   a. in the setting of warfarin therapy.  . Hyperlipidemia   . Hypertension   . Syncope    a. 09/2016 Echo: EF 55-60%, no rwma, Gr1 DD.   Past Surgical History:  Procedure Laterality Date  . ABCESS DRAINAGE  06/14/2016   appendix  . NECK SURGERY     Family History  Problem Relation Age of Onset  . Diabetes Mother   . Dementia Mother   . Heart disease Mother   . Cancer Mother        lung  . Cancer Father        colon  . Stroke Daughter   . Hyperlipidemia Daughter   . Breast cancer Neg Hx    Social History   Occupational History  . Not on file.   Social History Main Topics  . Smoking status: Never Smoker  . Smokeless tobacco: Never Used  . Alcohol use No  . Drug use: No  . Sexual activity: Not on file  Tobacco Counseling Counseling given: Not Answered   Activities of Daily Living In your present state of health, do you have any difficulty performing the following activities: 07/01/2017 09/11/2016  Hearing? N N  Vision? N N  Difficulty concentrating or making decisions? Y N  Comment Some difficulty remembering since she had a stroke -  Walking or climbing stairs? Y N  Comment Unsteady gait -  Dressing or bathing? N N  Doing errands, shopping? Y N  Comment She no longer drives. -  Preparing Food and eating ? N -  Using the Toilet? N -  In the past six months, have you accidently leaked urine? N -  Do you have problems with loss of bowel control? N -  Managing your Medications? Y -  Comment Husband assists -  Managing your Finances? Y -  Comment Husband assits -  Housekeeping or managing your  Housekeeping? Y -  Comment Housekeeping assists every other week -  Some recent data might be hidden    Immunizations and Health Maintenance  There is no immunization history on file for this patient. Health Maintenance Due  Topic Date Due  . TETANUS/TDAP  10/14/1957  . PNA vac Low Risk Adult (1 of 2 - PCV13) 10/15/2003  . FOOT EXAM  05/03/2016  . OPHTHALMOLOGY EXAM  05/04/2017    Patient Care Team: [name], MD as PCP - General (Family Medicine) Dionisio David, MD (Cardiology)  Indicate any recent Medical Services you may have received from other than [facility] providers in the past year (date may be approximate).     Assessment:   This is a routine wellness examination for Laurie Harris. The goal of the wellness visit is to assist the patient how to close the gaps in care and create a preventative care plan for the patient.   The roster of all physicians providing medical care to patient is listed in the Snapshot section of the chart.  Taking calcium VIT D as appropriate/Osteoporosis reviewed.    Safety issues reviewed; Smoke and carbon monoxide detectors in the home. No firearms in the home.  Wears seatbelts when riding with others. Patient does wear sunscreen or protective clothing when in direct sunlight. No violence in the home.  Depression- PHQ 2 &9 complete.  No signs/symptoms or verbal communication regarding little pleasure in doing things, feeling down, depressed or hopeless. No changes in sleeping, energy, eating, concentrating.  No thoughts of self harm or harm towards others.  Time spent on this topic is 9 minutes.   Patient is alert, normal appearance, oriented to person/place/and time.  Correctly identified the president of the [place], recall of 3/3 words, and performing simple calculations. Displays appropriate judgement and can read correct time from watch face.   No new identified risk were noted.  No failures at ADL's or IADL's.  Ambulates with cane as  needed.  BMI- discussed the importance of a healthy diet, water intake and the benefits of aerobic exercise. Educational material provided.   24 hour diet recall: Breakfast: sausage muffin, scrambled egg, sausage Lunch: deli sandwich Dinner: hotdog, chili, slaw   Daily fluid intake: 0 cups of caffeine, 6  cups of water  Dental- every 6 months.  Dr. Lynelle Smoke.  Eye- Visual acuity not assessed per patient preference since they have regular follow up with the ophthalmologist.  Wears corrective lenses.  Sleep patterns- Sleeps 6 hours at night.  Wakes feeling rested.  Naps during the day.  Labs completed; notified patient Medicare may  not cover the cost of today's labs.  $37 for Folate and $38 for B12.  Verbalized permission and understanding.  Prevnar 13 vaccine deferred per patient request.  Educational material provided.   TDAP vaccine deferred per patient preference.  Follow up with insurance.  Educational material provided.  Hearing/Vision screen Hearing Screening Comments: Patient is able to hear conversational tones without difficulty.  No issues reported.   Vision Screening Comments: Followed [facility] Wears corrective lenses when reading Last OV 07/2016 Visual acuity not assessed per patient preference since they have regular follow up with the ophthalmologist  Dietary issues and exercise activities discussed: Current Exercise Habits: The patient does not participate in regular exercise at present  Goals    . Increase physical activity          Continue home physical therapy exercises (standing, sitting, lying) daily       Depression Screen PHQ 2/9 Scores 07/01/2017 03/07/2017 02/06/2016  PHQ - 2 Score 1 0 0    Fall Risk Fall Risk  07/01/2017 03/07/2017 02/06/2016  Falls in the past year? Yes No Yes  Number falls in past yr: 1 - 1  Injury with Fall? No - Yes  Risk Factor Category  - - High Fall Risk  Follow up Falls prevention discussed - -    Cognitive  Function: MMSE - Mini Mental State Exam 07/01/2017  Orientation to time 5  Orientation to Place 5  Registration 3  Attention/ Calculation 5  Recall 3  Language- name 2 objects 2  Language- repeat 1  Language- follow 3 step command 3  Language- read & follow direction 1  Write a sentence 1  Copy design 1  Total score 30        Screening Tests Health Maintenance  Topic Date Due  . TETANUS/TDAP  10/14/1957  . PNA vac Low Risk Adult (1 of 2 - PCV13) 10/15/2003  . FOOT EXAM  05/03/2016  . OPHTHALMOLOGY EXAM  05/04/2017  . HEMOGLOBIN A1C  12/16/2017  . COLONOSCOPY  07/20/2019  . DEXA SCAN  Completed      Plan:    End of life planning; Advance aging; Advanced directives discussed. Copy of current HCPOA/Living Will requested.    I have personally reviewed and noted the following in the patient's chart:   . Medical and social history . Use of alcohol, tobacco or illicit drugs  . Current medications and supplements . Functional ability and status . Nutritional status . Physical activity . Advanced directives . List of other physicians . Hospitalizations, surgeries, and ER visits in previous 12 months . Vitals . Screenings to include cognitive, depression, and falls . Referrals and appointments  In addition, I have reviewed and discussed with patient certain preventive protocols, quality metrics, and best practice recommendations. A written personalized care plan for preventive services as well as general preventive health recommendations were provided to patient.     Varney Biles, LPN   [id number]

## 2017-07-01 NOTE — Patient Instructions (Addendum)
  Laurie Harris , Thank you for taking time to come for your Medicare Wellness Visit. I appreciate your ongoing commitment to your health goals. Please review the following plan we discussed and let me know if I can assist you in the future.   Follow up with Dr. Caryl Bis as needed.    Bring a copy of your [place] and/or Living Will to be scanned into chart.  Have a great day!  These are the goals we discussed: Goals    . Increase physical activity          Continue home physical therapy exercises (standing, sitting, lying) daily        This is a list of the screening recommended for you and due dates:  Health Maintenance  Topic Date Due  . Tetanus Vaccine  10/14/1957  . Pneumonia vaccines (1 of 2 - PCV13) 10/15/2003  . Complete foot exam   05/03/2016  . Eye exam for diabetics  05/04/2017  . Hemoglobin A1C  12/16/2017  . Colon Cancer Screening  07/20/2019  . DEXA scan (bone density measurement)  Completed

## 2017-07-03 ENCOUNTER — Telehealth: Payer: Self-pay | Admitting: Family Medicine

## 2017-07-03 DIAGNOSIS — D7589 Other specified diseases of blood and blood-forming organs: Secondary | ICD-10-CM

## 2017-07-03 NOTE — Telephone Encounter (Signed)
Referral placed.

## 2017-07-03 NOTE — Telephone Encounter (Signed)
Her lab work was negative for cause. I would like to refer her to hematology for further evaluation. Thanks.

## 2017-07-03 NOTE — Telephone Encounter (Signed)
Left message with husband to return call

## 2017-07-03 NOTE — Telephone Encounter (Signed)
Please advise 

## 2017-07-03 NOTE — Telephone Encounter (Signed)
Patient notified and would like hematology referral Melissa, patient would like to be scheduled at a different vein and vascular office, she states she did not have a good experience with this location.

## 2017-07-03 NOTE — Telephone Encounter (Signed)
Patient called asking for her results from her labs that were drawn on 10.29.18.

## 2017-07-03 NOTE — Telephone Encounter (Signed)
Pt called returning your call. Thank you! °

## 2017-07-05 ENCOUNTER — Telehealth: Payer: Self-pay | Admitting: Cardiovascular Disease

## 2017-07-05 ENCOUNTER — Telehealth: Payer: Self-pay

## 2017-07-05 NOTE — Telephone Encounter (Signed)
Spoke with patient to clarify that hematology was trying to get in touch with her to set up appt for further evaluation. Patient gave verbal understanding.   Copied from [place] [phone]. Topic: Inquiry >> Jul 05, 2017  9:47 AM Corie Chiquito, NT wrote: Reason for DXI:[id number] called and stated that someone named Beverlee Nims called her from [phone] to make an appointment but she can't remember what the name of the office is that she was calling from, and the patient doesn't understand why she would have to make an appointment with that office. Patient also said that she has already had her b-12 checked. She would like someone from the office to give her a call back please

## 2017-07-05 NOTE — Telephone Encounter (Signed)
Patient has an order for carotid 1 yr fu in wq due in Feb 2018 but she had this at the hospital during an admission early  On 09-11-16 not noted on results when to fu    Order in wq still and patient will not be du for another fu ov until August next year please advise

## 2017-07-05 NOTE — Telephone Encounter (Signed)
Pt had carotid US January 2018. Pt needs repeat January 2019.

## 2017-07-06 NOTE — Progress Notes (Signed)
I have reviewed the above note and agree.  Mamye Bolds, M.D.  

## 2017-07-10 DIAGNOSIS — H2513 Age-related nuclear cataract, bilateral: Secondary | ICD-10-CM | POA: Diagnosis not present

## 2017-07-10 LAB — HM DIABETES EYE EXAM

## 2017-07-12 ENCOUNTER — Encounter: Payer: Self-pay | Admitting: Family Medicine

## 2017-07-18 ENCOUNTER — Inpatient Hospital Stay: Payer: Medicare Other

## 2017-07-18 ENCOUNTER — Encounter: Payer: Self-pay | Admitting: Oncology

## 2017-07-18 ENCOUNTER — Inpatient Hospital Stay: Payer: Medicare Other | Attending: Oncology | Admitting: Oncology

## 2017-07-18 ENCOUNTER — Other Ambulatory Visit: Payer: Self-pay

## 2017-07-18 DIAGNOSIS — E119 Type 2 diabetes mellitus without complications: Secondary | ICD-10-CM | POA: Diagnosis not present

## 2017-07-18 DIAGNOSIS — I2510 Atherosclerotic heart disease of native coronary artery without angina pectoris: Secondary | ICD-10-CM | POA: Insufficient documentation

## 2017-07-18 DIAGNOSIS — Z8673 Personal history of transient ischemic attack (TIA), and cerebral infarction without residual deficits: Secondary | ICD-10-CM | POA: Insufficient documentation

## 2017-07-18 DIAGNOSIS — E785 Hyperlipidemia, unspecified: Secondary | ICD-10-CM | POA: Insufficient documentation

## 2017-07-18 DIAGNOSIS — Z801 Family history of malignant neoplasm of trachea, bronchus and lung: Secondary | ICD-10-CM | POA: Diagnosis not present

## 2017-07-18 DIAGNOSIS — Z800 Family history of malignant neoplasm of digestive organs: Secondary | ICD-10-CM | POA: Diagnosis not present

## 2017-07-18 DIAGNOSIS — D7589 Other specified diseases of blood and blood-forming organs: Secondary | ICD-10-CM

## 2017-07-18 DIAGNOSIS — N183 Chronic kidney disease, stage 3 (moderate): Secondary | ICD-10-CM | POA: Diagnosis not present

## 2017-07-18 DIAGNOSIS — K5730 Diverticulosis of large intestine without perforation or abscess without bleeding: Secondary | ICD-10-CM

## 2017-07-18 DIAGNOSIS — K8020 Calculus of gallbladder without cholecystitis without obstruction: Secondary | ICD-10-CM | POA: Insufficient documentation

## 2017-07-18 DIAGNOSIS — K439 Ventral hernia without obstruction or gangrene: Secondary | ICD-10-CM | POA: Diagnosis not present

## 2017-07-18 DIAGNOSIS — Z86010 Personal history of colonic polyps: Secondary | ICD-10-CM | POA: Diagnosis not present

## 2017-07-18 DIAGNOSIS — Z7982 Long term (current) use of aspirin: Secondary | ICD-10-CM | POA: Insufficient documentation

## 2017-07-18 DIAGNOSIS — I6529 Occlusion and stenosis of unspecified carotid artery: Secondary | ICD-10-CM | POA: Diagnosis not present

## 2017-07-18 DIAGNOSIS — I129 Hypertensive chronic kidney disease with stage 1 through stage 4 chronic kidney disease, or unspecified chronic kidney disease: Secondary | ICD-10-CM | POA: Insufficient documentation

## 2017-07-18 DIAGNOSIS — R5383 Other fatigue: Secondary | ICD-10-CM | POA: Diagnosis not present

## 2017-07-18 DIAGNOSIS — Z794 Long term (current) use of insulin: Secondary | ICD-10-CM

## 2017-07-18 DIAGNOSIS — K760 Fatty (change of) liver, not elsewhere classified: Secondary | ICD-10-CM | POA: Diagnosis not present

## 2017-07-18 DIAGNOSIS — Z79899 Other long term (current) drug therapy: Secondary | ICD-10-CM | POA: Diagnosis not present

## 2017-07-18 LAB — CBC WITH DIFFERENTIAL/PLATELET
Basophils Absolute: 0.1 10*3/uL (ref 0–0.1)
Basophils Relative: 1 %
Eosinophils Absolute: 0.2 10*3/uL (ref 0–0.7)
Eosinophils Relative: 2 %
HEMATOCRIT: 38 % (ref 35.0–47.0)
HEMOGLOBIN: 12.8 g/dL (ref 12.0–16.0)
LYMPHS ABS: 2.2 10*3/uL (ref 1.0–3.6)
LYMPHS PCT: 25 %
MCH: 33.2 pg (ref 26.0–34.0)
MCHC: 33.7 g/dL (ref 32.0–36.0)
MCV: 98.6 fL (ref 80.0–100.0)
MONOS PCT: 7 %
Monocytes Absolute: 0.6 10*3/uL (ref 0.2–0.9)
NEUTROS ABS: 5.6 10*3/uL (ref 1.4–6.5)
NEUTROS PCT: 65 %
Platelets: 198 10*3/uL (ref 150–440)
RBC: 3.85 MIL/uL (ref 3.80–5.20)
RDW: 13.2 % (ref 11.5–14.5)
WBC: 8.6 10*3/uL (ref 3.6–11.0)

## 2017-07-18 LAB — TSH: TSH: 1.272 u[IU]/mL (ref 0.350–4.500)

## 2017-07-18 LAB — RETICULOCYTES
RBC.: 3.9 MIL/uL (ref 3.80–5.20)
RETIC CT PCT: 1.2 % (ref 0.4–3.1)
Retic Count, Absolute: 46.8 10*3/uL (ref 19.0–183.0)

## 2017-07-18 NOTE — Progress Notes (Signed)
Hematology/Oncology Consult note [facility] Telephone:([phone] [phone]  Patient Care Team: [name], MD as PCP - General (Family Medicine) Dionisio David, MD (Cardiology)   Name of the patient: Laurie Harris  [id number]  03-Oct-1940    Reason for referral- macrocytosis without anemia   Referring physician- Dr. Caryl Bis  Date of visit: 07/18/17   History of presenting illness-patient is a 76 year old female with past medical history significant for hypertension hyperlipidemia and diabetes.  He has been referred to [name] for evaluation of macrocytosis.  Most recent CBC from 06/26/2017 showed white count of 6, H&H of 12.3/36.6 with an MCV of 103 and a platelet count of 224.  Earlier this year between January - May 2018 her MCV was between 98-99.  Her hemoglobin has been stable between 12-13 since this year.  She does not have any pre-existing cytopenia such as leukopenia or thrombocytopenia.  TSH in January 2018 was normal at 0.9.  B12 levels in October 2018 were normal at 519.  Folate was normal  Patient is able to ambulate around the house and is independent of her ADLs.  She needs some assistance with IADLs.  Reports some fatigue.  Denies any fevers, chills, unintentional weight loss or loss of appetite  ECOG PS- 1-2  Pain scale- 0   Review of systems- Review of Systems  Constitutional: Negative for chills, fever, malaise/fatigue and weight loss.  HENT: Negative for congestion, ear discharge and nosebleeds.   Eyes: Negative for blurred vision.  Respiratory: Negative for cough, hemoptysis, sputum production, shortness of breath and wheezing.   Cardiovascular: Negative for chest pain, palpitations, orthopnea and claudication.  Gastrointestinal: Negative for abdominal pain, blood in stool, constipation, diarrhea, heartburn, melena, nausea and vomiting.  Genitourinary: Negative for dysuria, flank pain, frequency, hematuria and urgency.    Musculoskeletal: Negative for back pain, joint pain and myalgias.  Skin: Negative for rash.  Neurological: Negative for dizziness, tingling, focal weakness, seizures, weakness and headaches.  Endo/Heme/Allergies: Does not bruise/bleed easily.  Psychiatric/Behavioral: Negative for depression and suicidal ideas. The patient does not have insomnia.     Allergies  Allergen Reactions  . Warfarin And Related     Possible stroke    Patient Active Problem List   Diagnosis Date Noted  . Abdominal pain 11/05/2016  . Syncope 09/10/2016  . Appendicitis 08/07/2016  . Carotid artery disease ([place]) 06/19/2016  . CKD (chronic kidney disease) stage 3, GFR 30-59 ml/min (HCC) 05/10/2016  . Screening for breast cancer 02/06/2016  . Diabetes mellitus type 2, controlled, with complications ([place]) 49/67/5916  . Benign essential HTN 03/16/2015  . Hyperlipidemia 03/16/2015  . Cerebellar hemorrhage ([place]) 03/16/2015  . Adenomatous polyp of colon 06/04/2014  . Carotid artery occlusion without infarction 08/18/2013     Past Medical History:  Diagnosis Date  . Carotid arterial disease ([place])    a. 09/2016 Carotid U/S: R carotid bifurcation dzs of 50-69%, L carotid bifurcation dzs of <50%. Patent vertebral arteries w/ anegrade flow.  . Chicken pox   . CKD (chronic kidney disease), stage III ([place])   . Diabetes mellitus without complication ([place])   . Hemorrhagic stroke ([place]) 2016   a. in the setting of warfarin therapy.  . Hyperlipidemia   . Hypertension   . Syncope    a. 09/2016 Echo: EF 55-60%, no rwma, Gr1 DD.     Past Surgical History:  Procedure Laterality Date  . ABCESS DRAINAGE  06/14/2016   appendix  . NECK SURGERY  Social History   Socioeconomic History  . Marital status: Married    Spouse name: Not on file  . Number of children: Not on file  . Years of education: Not on file  . Highest education level: Not on file  Social Needs  . Financial resource strain: Not on file  .  Food insecurity - worry: Not on file  . Food insecurity - inability: Not on file  . Transportation needs - medical: Not on file  . Transportation needs - non-medical: Not on file  Occupational History  . Not on file  Tobacco Use  . Smoking status: Never Smoker  . Smokeless tobacco: Never Used  Substance and Sexual Activity  . Alcohol use: No    Alcohol/week: 0.0 oz  . Drug use: No  . Sexual activity: Not on file  Other Topics Concern  . Not on file  Social History Narrative  . Not on file     Family History  Problem Relation Age of Onset  . Diabetes Mother   . Dementia Mother   . Heart disease Mother   . Cancer Mother        lung  . Cancer Father        colon  . Stroke Daughter   . Hyperlipidemia Daughter   . Breast cancer Neg Hx      Current Outpatient Medications:  .  alendronate (FOSAMAX) 70 MG tablet, TAKE 1 TABLET EVERY 7 DAYS WITH A FULL GLASS OF WATER ON AN EMPTY STOMACH DO NOT LIE DOWN FOR AT LEAST 30 MIN, Disp: 4 tablet, Rfl: 3 .  aspirin EC 81 MG tablet, Take 81 mg by mouth daily., Disp: , Rfl:  .  atorvastatin (LIPITOR) 80 MG tablet, TAKE ONE TABLET EVERY DAY, Disp: 90 tablet, Rfl: 2 .  benazepril (LOTENSIN) 5 MG tablet, TAKE TWO TABLETS BY MOUTH EVERY DAY (Patient taking differently: Take 2.87m nighlty.), Disp: 180 tablet, Rfl: 0 .  calcium-vitamin D (OSCAL WITH D) 500-200 MG-UNIT tablet, Take 1 tablet by mouth 2 (two) times daily., Disp: , Rfl:  .  cholecalciferol (VITAMIN D) 1000 units tablet, Take 2,000 Units by mouth daily., Disp: , Rfl:  .  fenofibrate 160 MG tablet, Take 160 mg by mouth at bedtime., Disp: , Rfl:  .  Insulin Lispro Prot & Lispro (HUMALOG MIX 75/25 KWIKPEN) (75-25) 100 UNIT/ML Kwikpen, INJECT 19 units in the a.m. and 18 units in the p.m. AS DIRECTED, Disp: 15 mL, Rfl: 2 .  Insulin Pen Needle (NOVOFINE PLUS) 32G X 4 MM MISC, USE TWICE DAILY AS DIRECTED, Disp: 100 each, Rfl: 6 .  Multiple Vitamin (MULTIVITAMIN WITH MINERALS) TABS tablet,  Take 1 tablet by mouth daily., Disp: , Rfl:  .  Omega-3 Fatty Acids (FISH OIL) 1200 MG CAPS, Take 1,200 mg by mouth daily., Disp: , Rfl:  .  pantoprazole (PROTONIX) 40 MG tablet, TAKE 1 TABLET BY MOUTH DAILY, Disp: 90 tablet, Rfl: 2   Physical exam:  Vitals:   07/18/17 1046  BP: (!) 162/70  Pulse: 76  Resp: 16  Temp: 98 F (36.7 C)  TempSrc: Tympanic  Weight: 126 lb (57.2 kg)  Height: 5' 6"  (1.676 m)   Physical Exam  Constitutional: She is oriented to person, place, and time.  Elderly female sitting in a wheelchair.  Appears in no acute distress  HENT:  Head: Normocephalic and atraumatic.  Eyes: EOM are normal. Pupils are equal, round, and reactive to light.  Neck: Normal range of motion.  Cardiovascular: Normal rate, regular rhythm and normal heart sounds.  Pulmonary/Chest: Effort normal and breath sounds normal.  Abdominal: Soft. Bowel sounds are normal.  Neurological: She is alert and oriented to person, place, and time.  Skin: Skin is warm and dry.       CMP Latest Ref Rng & Units 06/28/2017  Glucose 70 - 99 mg/dL -  BUN 6 - 23 mg/dL -  Creatinine 0.44 - 1.00 mg/dL 1.20(H)  Sodium 135 - 145 mEq/L -  Potassium 3.5 - 5.1 mEq/L -  Chloride 96 - 112 mEq/L -  CO2 19 - 32 mEq/L -  Calcium 8.4 - 10.5 mg/dL -  Total Protein 6.0 - 8.3 g/dL -  Total Bilirubin 0.2 - 1.2 mg/dL -  Alkaline Phos 39 - 117 U/L -  AST 0 - 37 U/L -  ALT 0 - 35 U/L -   CBC Latest Ref Rng & Units 06/26/2017  WBC 4.0 - 10.5 K/uL 6.0  Hemoglobin 12.0 - 15.0 g/dL 12.3  Hematocrit 36.0 - 46.0 % 36.6  Platelets 150.0 - 400.0 K/uL 224.0    No images are attached to the encounter.  Ct Abdomen Pelvis W Contrast  Result Date: 06/28/2017 CLINICAL DATA:  Generalized abdominal pain. Previous drainage of periappendiceal region abscess. EXAM: CT ABDOMEN AND PELVIS WITH CONTRAST TECHNIQUE: Multidetector CT imaging of the abdomen and pelvis was performed using the standard protocol following bolus  administration of intravenous contrast. Oral contrast was also administered. CONTRAST:  65m ISOVUE-300 IOPAMIDOL (ISOVUE-300) INJECTION 61% COMPARISON:  July 19, 2016 FINDINGS: Lower chest: There is slight bibasilar atelectasis. There is no lung base edema or consolidation. There are foci of coronary artery calcification. Hepatobiliary: There is hepatic steatosis. No focal liver lesions are appreciable on this study. There are gallstones within the gallbladder. Gallbladder wall does not appear appreciably thickened. There is no biliary duct dilatation. Pancreas: No pancreatic mass or inflammatory focus. Spleen: No splenic lesions are evident. Adrenals/Urinary Tract: Adrenals bilaterally appear normal. There is a 5 mm area of decreased attenuation in the lateral upper pole left kidney which is too small to accurately characterize. Statistically, it most likely represents a small cyst. There is no appreciable hydronephrosis on either side. There is no renal or ureteral calculus on either side. Urinary bladder is midline with wall thickness within normal limits. Stomach/Bowel: There is widespread sigmoid diverticulosis. No diverticulitis evident. Scattered diverticular noted elsewhere throughout colon. There is no appreciable bowel wall or mesenteric thickening. There is probable stool is a in the ascending colon with limited oral contrast opacification in this area. There is no evident bowel obstruction. No free air or portal venous air. No bowel pneumatosis. Vascular/Lymphatic: There is atherosclerotic calcification in the aorta and iliac arteries. There is felt to be likely hemodynamically significant obstruction in portions of the proximal common iliac arteries bilaterally. Aorta is tortuous, but there is no aneurysm. Major mesenteric vessels are patent, although there is extensive atherosclerotic calcification in the proximal mesenteric vessels. No adenopathy is appreciable in the abdomen or pelvis.  Reproductive: Uterus is anteverted.  No evident pelvic mass. Other: There is currently no right lower quadrant/periappendiceal region inflammation. Appendix not appreciable. No abscess or ascites evident in the abdomen or pelvis. There is a minimal ventral hernia containing only fat. Musculoskeletal: There is lumbar dextroscoliosis. There is extensive multilevel osteoarthritic change in the lumbar region. There are no blastic or lytic bone lesions. Bones do appear overall osteoporotic. There is no intramuscular or abdominal wall lesion. IMPRESSION: 1.  No abscess.  No periappendiceal region inflammation. 2. Widespread colonic diverticulosis without diverticulitis. No bowel obstruction. 3.  No renal or ureteral calculus.  No hydronephrosis. 4.  Cholelithiasis. 5.  Hepatic steatosis. 6. Extensive aortoiliac atherosclerosis with probable hemodynamically significant obstruction in both common iliac arteries. Extensive calcification in the proximal major mesenteric vessels. No changes of bowel ischemia are evident by CT. There also foci of coronary artery calcification. 7. Extensive multilevel arthropathy in the lumbar spine. Lumbar scoliosis evident. Electronically Signed   By: Lowella Grip III M.D.   On: 06/28/2017 10:13    Assessment and plan- Patient is a 76 y.o. female who has been referred to [name] for macrocytosis without anemia  Patient already had a baseline B12 and folate checked which was normal.  She does not have any known liver disease.  Prior TSH from January 2018 was normal.  She is not on any medications that can cause macrocytosis.  Given her age we may be dealing with a subclinical MDS which is initially presenting with macrocytosis but no overt anemia.  Today I will repeat a CBC with differential, reticulocyte count, TSH and multiple myeloma panel.  I will see the patient back in 2 weeks time to discuss the results of her blood work if these tests are negative, given that the patient is not  anemic I am inclined to monitor this conservatively without doing a bone marrow biopsy at this time.  Even if the patient has evidence of MDS which can only be diagnosed on bone marrow biopsy, and it would not change management at this time given that she does not have any cytopenias.  Patient is in understanding of the plan   Thank you for this kind referral and the opportunity to participate in the care of this patient   Visit Diagnosis 1. Macrocytosis without anemia     Dr. Randa Evens, MD, MPH [facility] Pager- [phone] 07/18/2017 2:04 PM

## 2017-07-18 NOTE — Progress Notes (Signed)
New consult for macrocytosis without anemia. Patient states that she feels fairly well except for low energy. She is in a wheelchair today because it is hard for her to walk long distances. She had a hemologic stroke in 2016.

## 2017-07-19 LAB — MULTIPLE MYELOMA PANEL, SERUM
ALBUMIN SERPL ELPH-MCNC: 3.6 g/dL (ref 2.9–4.4)
ALPHA 1: 0.3 g/dL (ref 0.0–0.4)
Albumin/Glob SerPl: 1.3 (ref 0.7–1.7)
Alpha2 Glob SerPl Elph-Mcnc: 0.7 g/dL (ref 0.4–1.0)
B-Globulin SerPl Elph-Mcnc: 1 g/dL (ref 0.7–1.3)
GLOBULIN, TOTAL: 2.9 g/dL (ref 2.2–3.9)
Gamma Glob SerPl Elph-Mcnc: 0.9 g/dL (ref 0.4–1.8)
IGA: 107 mg/dL (ref 64–422)
IGM (IMMUNOGLOBULIN M), SRM: 33 mg/dL (ref 26–217)
IgG (Immunoglobin G), Serum: 900 mg/dL (ref 700–1600)
TOTAL PROTEIN ELP: 6.5 g/dL (ref 6.0–8.5)

## 2017-07-22 ENCOUNTER — Encounter (INDEPENDENT_AMBULATORY_CARE_PROVIDER_SITE_OTHER): Payer: Self-pay | Admitting: Vascular Surgery

## 2017-07-26 ENCOUNTER — Ambulatory Visit: Payer: Self-pay

## 2017-07-26 NOTE — Telephone Encounter (Signed)
  Reason for Disposition . Cough  Answer Assessment - Initial Assessment Questions 1. ONSET: "When did the cough begin?"      1 week ago 2. SEVERITY: "How bad is the cough today?"   Comes and goes Pt states that coughing episode lasts less than a minute 3. RESPIRATORY DISTRESS: "Describe your breathing."     Feels normal except for the cough 4. FEVER: "Do you have a fever?" If so, ask: "What is your temperature, how was it measured, and when did it start?"     No fever 5. HEMOPTYSIS: "Are you coughing up any blood?" If so ask: "How much?" (flecks, streaks, tablespoons, etc.)     No  6. TREATMENT: "What have you done so far to treat the cough?" (e.g., meds, fluids, humidifier)     nothing 7. CARDIAC HISTORY: "Do you have any history of heart disease?" (e.g., heart attack, congestive heart failure)      HTN  8. LUNG HISTORY: "Do you have any history of lung disease?"  (e.g., pulmonary embolus, asthma, emphysema)     no 9. PE RISK FACTORS: "Do you have a history of blood clots?" (or: recent major surgery, recent prolonged travel, bedridden )     CVA 2 yrs ago  49. OTHER SYMPTOMS: "Do you have any other symptoms? (e.g., runny nose, wheezing, chest pain)       Congested cough no other sx 11. PREGNANCY: "Is there any chance you are pregnant?" "When was your last menstrual period?"       n/a 12. TRAVEL: "Have you traveled out of the country in the last month?" (e.g., travel history, exposures)       no  Protocols used: COUGH - ACUTE NON-PRODUCTIVE-A-AH

## 2017-08-02 ENCOUNTER — Encounter: Payer: Self-pay | Admitting: Oncology

## 2017-08-02 ENCOUNTER — Inpatient Hospital Stay (HOSPITAL_BASED_OUTPATIENT_CLINIC_OR_DEPARTMENT_OTHER): Payer: Medicare Other | Admitting: Oncology

## 2017-08-02 VITALS — BP 152/67 | HR 74 | Temp 98.3°F | Resp 16 | Wt 126.0 lb

## 2017-08-02 DIAGNOSIS — Z8673 Personal history of transient ischemic attack (TIA), and cerebral infarction without residual deficits: Secondary | ICD-10-CM

## 2017-08-02 DIAGNOSIS — E785 Hyperlipidemia, unspecified: Secondary | ICD-10-CM | POA: Diagnosis not present

## 2017-08-02 DIAGNOSIS — N183 Chronic kidney disease, stage 3 (moderate): Secondary | ICD-10-CM | POA: Diagnosis not present

## 2017-08-02 DIAGNOSIS — K439 Ventral hernia without obstruction or gangrene: Secondary | ICD-10-CM

## 2017-08-02 DIAGNOSIS — I6529 Occlusion and stenosis of unspecified carotid artery: Secondary | ICD-10-CM

## 2017-08-02 DIAGNOSIS — I2510 Atherosclerotic heart disease of native coronary artery without angina pectoris: Secondary | ICD-10-CM | POA: Diagnosis not present

## 2017-08-02 DIAGNOSIS — E119 Type 2 diabetes mellitus without complications: Secondary | ICD-10-CM

## 2017-08-02 DIAGNOSIS — Z794 Long term (current) use of insulin: Secondary | ICD-10-CM

## 2017-08-02 DIAGNOSIS — K5730 Diverticulosis of large intestine without perforation or abscess without bleeding: Secondary | ICD-10-CM | POA: Diagnosis not present

## 2017-08-02 DIAGNOSIS — Z801 Family history of malignant neoplasm of trachea, bronchus and lung: Secondary | ICD-10-CM

## 2017-08-02 DIAGNOSIS — Z7982 Long term (current) use of aspirin: Secondary | ICD-10-CM

## 2017-08-02 DIAGNOSIS — K8020 Calculus of gallbladder without cholecystitis without obstruction: Secondary | ICD-10-CM | POA: Diagnosis not present

## 2017-08-02 DIAGNOSIS — R5383 Other fatigue: Secondary | ICD-10-CM | POA: Diagnosis not present

## 2017-08-02 DIAGNOSIS — D7589 Other specified diseases of blood and blood-forming organs: Secondary | ICD-10-CM | POA: Diagnosis not present

## 2017-08-02 DIAGNOSIS — Z800 Family history of malignant neoplasm of digestive organs: Secondary | ICD-10-CM

## 2017-08-02 DIAGNOSIS — I129 Hypertensive chronic kidney disease with stage 1 through stage 4 chronic kidney disease, or unspecified chronic kidney disease: Secondary | ICD-10-CM | POA: Diagnosis not present

## 2017-08-02 DIAGNOSIS — K760 Fatty (change of) liver, not elsewhere classified: Secondary | ICD-10-CM | POA: Diagnosis not present

## 2017-08-02 DIAGNOSIS — Z79899 Other long term (current) drug therapy: Secondary | ICD-10-CM

## 2017-08-02 DIAGNOSIS — Z86010 Personal history of colonic polyps: Secondary | ICD-10-CM

## 2017-08-02 NOTE — Progress Notes (Signed)
Hematology/Oncology Consult note [facility]  Telephone:([phone] [phone]  Patient Care Team: [name], MD as PCP - General (Family Medicine) Dionisio David, MD (Cardiology)   Name of the patient: Laurie Harris  [id number]  05/22/1941   Date of visit: 08/02/17  Diagnosis-intermittent macrocytosis without anemia  Chief complaint/ Reason for visit-discuss results of blood work  Heme/Onc history:  patient is a 76 year old female with past medical history significant for hypertension hyperlipidemia and diabetes.  He has been referred to [name] for evaluation of macrocytosis.  Most recent CBC from 06/26/2017 showed white count of 6, H&H of 12.3/36.6 with an MCV of 103 and a platelet count of 224.  Earlier this year between January - May 2018 her MCV was between 98-99.  Her hemoglobin has been stable between 12-13 since this year.  She does not have any pre-existing cytopenia such as leukopenia or thrombocytopenia.  TSH in January 2018 was normal at 0.9.  B12 levels in October 2018 were normal at 519.  Folate was normal  Patient is able to ambulate around the house and is independent of her ADLs.  She needs some assistance with IADLs.  Reports some fatigue.  Denies any fevers, chills, unintentional weight loss or loss of appetite  Results of blood work from 07/18/2017 were as follows: CBC showed white count of 8.6, H&H of 12.8/38 with an MCV of 98.6 and a platelet count of 198.  Reticulocyte count was normal at 1.2%.  TSH was normal at 1.27.  Multiple myeloma panel did not reveal any monoclonal protein.  Immunofixation was normal  Interval history- reports mild fatigue. Denies other complaints  ECOG PS- 1 Pain scale- 0   Review of systems- Review of Systems  Constitutional: Negative for chills, fever, malaise/fatigue and weight loss.  HENT: Negative for congestion, ear discharge and nosebleeds.   Eyes: Negative for blurred vision.    Respiratory: Negative for cough, hemoptysis, sputum production, shortness of breath and wheezing.   Cardiovascular: Negative for chest pain, palpitations, orthopnea and claudication.  Gastrointestinal: Negative for abdominal pain, blood in stool, constipation, diarrhea, heartburn, melena, nausea and vomiting.  Genitourinary: Negative for dysuria, flank pain, frequency, hematuria and urgency.  Musculoskeletal: Negative for back pain, joint pain and myalgias.  Skin: Negative for rash.  Neurological: Negative for dizziness, tingling, focal weakness, seizures, weakness and headaches.  Endo/Heme/Allergies: Does not bruise/bleed easily.  Psychiatric/Behavioral: Negative for depression and suicidal ideas. The patient does not have insomnia.       Allergies  Allergen Reactions  . Warfarin And Related     Possible stroke     Past Medical History:  Diagnosis Date  . Carotid arterial disease ([place])    a. 09/2016 Carotid U/S: R carotid bifurcation dzs of 50-69%, L carotid bifurcation dzs of <50%. Patent vertebral arteries w/ anegrade flow.  . Chicken pox   . CKD (chronic kidney disease), stage III ([place])   . Diabetes mellitus without complication ([place])   . Hemorrhagic stroke ([place]) 2016   a. in the setting of warfarin therapy.  . Hyperlipidemia   . Hypertension   . Syncope    a. 09/2016 Echo: EF 55-60%, no rwma, Gr1 DD.     Past Surgical History:  Procedure Laterality Date  . ABCESS DRAINAGE  06/14/2016   appendix  . NECK SURGERY      Social History   Socioeconomic History  . Marital status: Married    Spouse name: Not on file  .  Number of children: Not on file  . Years of education: Not on file  . Highest education level: Not on file  Social Needs  . Financial resource strain: Not on file  . Food insecurity - worry: Not on file  . Food insecurity - inability: Not on file  . Transportation needs - medical: Not on file  . Transportation needs - non-medical: Not on file   Occupational History  . Not on file  Tobacco Use  . Smoking status: Never Smoker  . Smokeless tobacco: Never Used  Substance and Sexual Activity  . Alcohol use: No    Alcohol/week: 0.0 oz  . Drug use: No  . Sexual activity: Not on file  Other Topics Concern  . Not on file  Social History Narrative  . Not on file    Family History  Problem Relation Age of Onset  . Diabetes Mother   . Dementia Mother   . Heart disease Mother   . Cancer Mother        lung  . Cancer Father        colon  . Stroke Daughter   . Hyperlipidemia Daughter   . Breast cancer Neg Hx      Current Outpatient Medications:  .  alendronate (FOSAMAX) 70 MG tablet, TAKE 1 TABLET EVERY 7 DAYS WITH A FULL GLASS OF WATER ON AN EMPTY STOMACH DO NOT LIE DOWN FOR AT LEAST 30 MIN, Disp: 4 tablet, Rfl: 3 .  aspirin EC 81 MG tablet, Take 81 mg by mouth daily., Disp: , Rfl:  .  atorvastatin (LIPITOR) 80 MG tablet, TAKE ONE TABLET EVERY DAY, Disp: 90 tablet, Rfl: 2 .  benazepril (LOTENSIN) 5 MG tablet, TAKE TWO TABLETS BY MOUTH EVERY DAY (Patient taking differently: Take 2.46m nighlty.), Disp: 180 tablet, Rfl: 0 .  calcium-vitamin D (OSCAL WITH D) 500-200 MG-UNIT tablet, Take 1 tablet by mouth 2 (two) times daily., Disp: , Rfl:  .  cholecalciferol (VITAMIN D) 1000 units tablet, Take 2,000 Units by mouth daily., Disp: , Rfl:  .  fenofibrate 160 MG tablet, Take 160 mg by mouth at bedtime., Disp: , Rfl:  .  Insulin Lispro Prot & Lispro (HUMALOG MIX 75/25 KWIKPEN) (75-25) 100 UNIT/ML Kwikpen, INJECT 19 units in the a.m. and 18 units in the p.m. AS DIRECTED, Disp: 15 mL, Rfl: 2 .  Insulin Pen Needle (NOVOFINE PLUS) 32G X 4 MM MISC, USE TWICE DAILY AS DIRECTED, Disp: 100 each, Rfl: 6 .  Multiple Vitamin (MULTIVITAMIN WITH MINERALS) TABS tablet, Take 1 tablet by mouth daily., Disp: , Rfl:  .  Omega-3 Fatty Acids (FISH OIL) 1200 MG CAPS, Take 1,200 mg by mouth daily., Disp: , Rfl:  .  pantoprazole (PROTONIX) 40 MG tablet, TAKE  1 TABLET BY MOUTH DAILY, Disp: 90 tablet, Rfl: 2  Physical exam:  Vitals:   08/02/17 1352 08/02/17 1354  BP:  (!) 152/67  Pulse:  74  Resp:  16  Temp:  98.3 F (36.8 C)  TempSrc:  Tympanic  Weight: 126 lb (57.2 kg)    Physical Exam  Constitutional: She is oriented to person, place, and time.  Thin female sitting in a wheelchair.  Appears in no acute distress  HENT:  Head: Normocephalic and atraumatic.  Eyes: EOM are normal. Pupils are equal, round, and reactive to light.  Neck: Normal range of motion.  Cardiovascular: Normal rate, regular rhythm and normal heart sounds.  Pulmonary/Chest: Effort normal and breath sounds normal.  Abdominal: Soft. Bowel  sounds are normal.  Neurological: She is alert and oriented to person, place, and time.  Skin: Skin is warm and dry.     CMP Latest Ref Rng & Units 06/28/2017  Glucose 70 - 99 mg/dL -  BUN 6 - 23 mg/dL -  Creatinine 0.44 - 1.00 mg/dL 1.20(H)  Sodium 135 - 145 mEq/L -  Potassium 3.5 - 5.1 mEq/L -  Chloride 96 - 112 mEq/L -  CO2 19 - 32 mEq/L -  Calcium 8.4 - 10.5 mg/dL -  Total Protein 6.0 - 8.3 g/dL -  Total Bilirubin 0.2 - 1.2 mg/dL -  Alkaline Phos 39 - 117 U/L -  AST 0 - 37 U/L -  ALT 0 - 35 U/L -   CBC Latest Ref Rng & Units 07/18/2017  WBC 3.6 - 11.0 K/uL 8.6  Hemoglobin 12.0 - 16.0 g/dL 12.8  Hematocrit 35.0 - 47.0 % 38.0  Platelets 150 - 440 K/uL 198      Assessment and plan- Patient is a 76 y.o. female referred for macrocytosis without anemia  On repeat CBC patient had normocytic normochromic RBCs.  No evidence of anemia.  Her white count and platelet count are normal.  Multiple myeloma panel did not reveal any monoclonal protein and TSH is normal.  She does have intermittent macrocytosis but no overt anemia or other cytopenias.  This may be indicative of a slowly evolving MDS given her age.  However given lack of cytopenias she does not need any further management at this time such as bone marrow biopsy.  I  will see her back in 6 months time with a repeat CBC and if her counts are stable at that time she can continue to follow-up with her primary care doctor   Visit Diagnosis 1. Macrocytosis without anemia      Dr. Randa Evens, MD, MPH [facility] Pager- [phone] 08/02/2017 2:21 PM

## 2017-08-02 NOTE — Progress Notes (Signed)
Patient here for follow up with results today. She states that she is feeling well except for a cough. She denies having any pain.

## 2017-08-13 ENCOUNTER — Encounter: Payer: Self-pay | Admitting: Gastroenterology

## 2017-08-13 ENCOUNTER — Ambulatory Visit (INDEPENDENT_AMBULATORY_CARE_PROVIDER_SITE_OTHER): Payer: Medicare Other | Admitting: Gastroenterology

## 2017-08-13 VITALS — BP 166/72 | HR 92 | Ht 66.0 in | Wt 125.4 lb

## 2017-08-13 DIAGNOSIS — K760 Fatty (change of) liver, not elsewhere classified: Secondary | ICD-10-CM | POA: Diagnosis not present

## 2017-08-13 DIAGNOSIS — I700 Atherosclerosis of aorta: Secondary | ICD-10-CM

## 2017-08-13 DIAGNOSIS — I70299 Other atherosclerosis of native arteries of extremities, unspecified extremity: Secondary | ICD-10-CM

## 2017-08-14 NOTE — Progress Notes (Signed)
Gastroenterology Consultation  Referring Provider:     [name], MD Primary Care Physician:  [name], MD Primary Gastroenterologist:  Dr. Allen Norris     Reason for Consultation:     Fatty liver        HPI:   Laurie Harris is a 76 y.o. y/o female referred for consultation & management of fatty liver by Dr. Caryl Bis, Angela Adam, MD.  This patient comes in today after being found to have fatty liver on a CT scan of the abdomen. He should have a CT scan of the abdomen due to generalized abdominal pain. The patient denies any abdominal pain at this time. She reports that she does not drink any alcohol. There is no report of any history of abnormal liver enzymes. The patient also denies any unexplained weight loss fevers chills nausea or vomiting. There is also no report of any black stools or bloody stools. The CT scan showed no abscess but did show diverticulosis without diverticulitis. There was also some gallstones seen in the gallbladder and arthrosclerotic disease. The patient's blood work showed her CBC to show normal platelets and hemoglobin. The patient appears to have had chronic elevation of her creatinine and BUN.  Past Medical History:  Diagnosis Date  . Carotid arterial disease ([place])    a. 09/2016 Carotid U/S: R carotid bifurcation dzs of 50-69%, L carotid bifurcation dzs of <50%. Patent vertebral arteries w/ anegrade flow.  . Chicken pox   . CKD (chronic kidney disease), stage III ([place])   . Diabetes mellitus without complication ([place])   . Hemorrhagic stroke ([place]) 2016   a. in the setting of warfarin therapy.  . Hyperlipidemia   . Hypertension   . Syncope    a. 09/2016 Echo: EF 55-60%, no rwma, Gr1 DD.    Past Surgical History:  Procedure Laterality Date  . ABCESS DRAINAGE  06/14/2016   appendix  . NECK SURGERY      Prior to Admission medications   Medication Sig Start Date End Date Taking? Authorizing Provider  alendronate (FOSAMAX) 70 MG tablet TAKE 1  TABLET EVERY 7 DAYS WITH A FULL GLASS OF WATER ON AN EMPTY STOMACH DO NOT LIE DOWN FOR AT LEAST 30 MIN 05/28/17  Yes [name], MD  aspirin EC 81 MG tablet Take 81 mg by mouth daily.   Yes [provider]  atorvastatin (LIPITOR) 80 MG tablet TAKE ONE TABLET EVERY DAY 12/17/16  Yes [name], MD  benazepril (LOTENSIN) 5 MG tablet TAKE TWO TABLETS BY MOUTH EVERY DAY Patient taking differently: Take 2.5mg  nighlty. 02/27/17  Yes [name], MD  calcium-vitamin D (OSCAL WITH D) 500-200 MG-UNIT tablet Take 1 tablet by mouth 2 (two) times daily.   Yes [provider]  cholecalciferol (VITAMIN D) 1000 units tablet Take 2,000 Units by mouth daily.   Yes [provider]  fenofibrate 160 MG tablet Take 160 mg by mouth at bedtime.   Yes [provider]  Insulin Lispro Prot & Lispro (HUMALOG MIX 75/25 KWIKPEN) (75-25) 100 UNIT/ML Kwikpen INJECT 19 units in the a.m. and 18 units in the p.m. AS DIRECTED 04/18/17  Yes [name], MD  Insulin Pen Needle (NOVOFINE PLUS) 32G X 4 MM MISC USE TWICE DAILY AS DIRECTED 03/13/17  Yes [name], MD  Multiple Vitamin (MULTIVITAMIN WITH MINERALS) TABS tablet Take 1 tablet by mouth daily.   Yes [provider]  Omega-3 Fatty Acids (FISH OIL)  1200 MG CAPS Take 1,200 mg by mouth daily.   Yes [provider]  pantoprazole (PROTONIX) 40 MG tablet TAKE 1 TABLET BY MOUTH DAILY 04/19/17  Yes [name], MD  Chlorpheniramine-DM (CORICIDIN HBP COUGH/COLD PO) Take by mouth.    [provider]    Family History  Problem Relation Age of Onset  . Diabetes Mother   . Dementia Mother   . Heart disease Mother   . Cancer Mother        lung  . Cancer Father        colon  . Stroke Daughter   . Hyperlipidemia Daughter   . Breast cancer Neg Hx      Social History   Tobacco Use  . Smoking status: Never Smoker  . Smokeless tobacco: Never Used  Substance Use Topics  . Alcohol  use: No    Alcohol/week: 0.0 oz  . Drug use: No    Allergies as of 08/13/2017 - Review Complete 08/13/2017  Allergen Reaction Noted  . Warfarin and related  07/01/2017    Review of Systems:    All systems reviewed and negative except where noted in HPI.   Physical Exam:  BP (!) 166/72 (BP Location: Right Arm, Patient Position: Sitting, Cuff Size: Normal)   Pulse 92   Ht 5\' 6"  (1.676 m)   Wt 125 lb 6.4 oz (56.9 kg)   BMI 20.24 kg/m  No LMP recorded. Patient is postmenopausal. Psych:  Alert and cooperative. Normal mood and affect. General:   Alert,  Well-developed, well-nourished, pleasant and cooperative in NAD Head:  Normocephalic and atraumatic. Eyes:  Sclera clear, no icterus.   Conjunctiva pink. Ears:  Normal auditory acuity. Nose:  No deformity, discharge, or lesions. Mouth:  No deformity or lesions,oropharynx pink & moist. Neck:  Supple; no masses or thyromegaly. Lungs:  Respirations even and unlabored.  Clear throughout to auscultation.   No wheezes, crackles, or rhonchi. No acute distress. Heart:  Regular rate and rhythm; no murmurs, clicks, rubs, or gallops. Abdomen:  Normal bowel sounds.  No bruits.  Soft, non-tender and non-distended without masses, hepatosplenomegaly or hernias noted.  No guarding or rebound tenderness.  Negative Carnett sign.   Rectal:  Deferred.  Msk:  Symmetrical without gross deformities.  Good, equal movement & strength bilaterally. Pulses:  Normal pulses noted. Extremities:  No clubbing or edema.  No cyanosis. Neurologic:  Alert and oriented x3;  grossly normal neurologically. Skin:  Intact without significant lesions or rashes.  No jaundice. Lymph Nodes:  No significant cervical adenopathy. Psych:  Alert and cooperative. Normal mood and affect.  Imaging Studies: No results found.  Assessment and Plan:   Laurie Harris is a 76 y.o. y/o female who comes in with a finding of fatty liver on a CT scan. The patient has no elevation of liver  enzymes in the recent past. The patient's last liver enzymes were checked in July. The patient will have her labs checked again to see if there is any increase in her liver enzymes. She will also be set up for blood work to check for her immunity to hepatitis A and B and she will need to be vaccinated if they show her to be susceptible. The patient has been explained that the way to bring down the amount of fat in her liver would be to keep her diabetes under control. The patient is presently not obese. With the patient not being obese weight loss will have only minimal effect  and decreasing the fat content of her liver. The patient denies that she suffers from hypertriglyceridemia. The patient will be contacted with her lab results.  Lucilla Lame, MD. Marval Regal   Note: This dictation was prepared with Dragon dictation along with smaller phrase technology. Any transcriptional errors that result from this process are unintentional.

## 2017-08-20 DIAGNOSIS — H2512 Age-related nuclear cataract, left eye: Secondary | ICD-10-CM | POA: Diagnosis not present

## 2017-08-22 ENCOUNTER — Other Ambulatory Visit: Payer: Self-pay

## 2017-08-22 ENCOUNTER — Encounter: Payer: Self-pay | Admitting: *Deleted

## 2017-08-28 NOTE — Discharge Instructions (Signed)

## 2017-09-04 ENCOUNTER — Encounter
Admission: RE | Disposition: A | Discharge status: Home / Self Care | Payer: Self-pay | Source: Ambulatory Visit | Attending: Ophthalmology

## 2017-09-04 ENCOUNTER — Ambulatory Visit
Admission: RE | Admit: 2017-09-04 | Discharge: 2017-09-04 | Disposition: A | Discharge status: Home / Self Care | Payer: Medicare Other | Source: Ambulatory Visit | Attending: Ophthalmology | Admitting: Ophthalmology

## 2017-09-04 ENCOUNTER — Ambulatory Visit: Payer: Medicare Other | Admitting: Student in an Organized Health Care Education/Training Program

## 2017-09-04 DIAGNOSIS — I10 Essential (primary) hypertension: Secondary | ICD-10-CM | POA: Insufficient documentation

## 2017-09-04 DIAGNOSIS — E1136 Type 2 diabetes mellitus with diabetic cataract: Secondary | ICD-10-CM | POA: Diagnosis not present

## 2017-09-04 DIAGNOSIS — H5703 Miosis: Secondary | ICD-10-CM | POA: Diagnosis not present

## 2017-09-04 DIAGNOSIS — I739 Peripheral vascular disease, unspecified: Secondary | ICD-10-CM | POA: Insufficient documentation

## 2017-09-04 DIAGNOSIS — H2512 Age-related nuclear cataract, left eye: Secondary | ICD-10-CM | POA: Diagnosis not present

## 2017-09-04 DIAGNOSIS — Z794 Long term (current) use of insulin: Secondary | ICD-10-CM | POA: Insufficient documentation

## 2017-09-04 DIAGNOSIS — N183 Chronic kidney disease, stage 3 (moderate): Secondary | ICD-10-CM | POA: Diagnosis not present

## 2017-09-04 HISTORY — PX: CATARACT EXTRACTION W/PHACO: SHX586

## 2017-09-04 LAB — GLUCOSE, CAPILLARY
Glucose-Capillary: 167 mg/dL — ABNORMAL HIGH (ref 65–99)
Glucose-Capillary: 187 mg/dL — ABNORMAL HIGH (ref 65–99)

## 2017-09-04 SURGERY — PHACOEMULSIFICATION, CATARACT, WITH IOL INSERTION
Anesthesia: Monitor Anesthesia Care | Site: Eye | Laterality: Left | Wound class: Clean

## 2017-09-04 MED ORDER — EPINEPHRINE PF 1 MG/ML IJ SOLN
INTRAOCULAR | Status: DC | PRN
  Administered 2017-09-04: 74 mL via OPHTHALMIC

## 2017-09-04 MED ORDER — NA HYALUR & NA CHOND-NA HYALUR 0.4-0.35 ML IO KIT
PACK | INTRAOCULAR | Status: DC | PRN
  Administered 2017-09-04: 1 mL via INTRAOCULAR

## 2017-09-04 MED ORDER — ONDANSETRON HCL 4 MG/2ML IJ SOLN: 4.0000 mg | Freq: Once | INTRAMUSCULAR | Status: DC | PRN

## 2017-09-04 MED ORDER — MIDAZOLAM HCL 2 MG/2ML IJ SOLN
INTRAMUSCULAR | Status: DC | PRN
  Administered 2017-09-04 (×2): 1 mg via INTRAVENOUS

## 2017-09-04 MED ORDER — CEFUROXIME OPHTHALMIC INJECTION 1 MG/0.1 ML
INJECTION | OPHTHALMIC | Status: DC | PRN
  Administered 2017-09-04: 0.1 mL via INTRACAMERAL

## 2017-09-04 MED ORDER — FENTANYL CITRATE (PF) 100 MCG/2ML IJ SOLN
INTRAMUSCULAR | Status: DC | PRN
  Administered 2017-09-04: 50 ug via INTRAVENOUS

## 2017-09-04 MED ORDER — LACTATED RINGERS IV SOLN: INTRAVENOUS | Status: DC

## 2017-09-04 MED ORDER — MOXIFLOXACIN HCL 0.5 % OP SOLN
1.0000 [drp] | OPHTHALMIC | Status: DC | PRN
  Administered 2017-09-04 (×3): 1 [drp] via OPHTHALMIC

## 2017-09-04 MED ORDER — ARMC OPHTHALMIC DILATING DROPS
1.0000 "application " | OPHTHALMIC | Status: DC | PRN
  Administered 2017-09-04 (×3): 1 via OPHTHALMIC

## 2017-09-04 MED ORDER — ACETAMINOPHEN 325 MG PO TABS: 650.0000 mg | ORAL_TABLET | Freq: Once | ORAL | Status: DC | PRN

## 2017-09-04 MED ORDER — BRIMONIDINE TARTRATE-TIMOLOL 0.2-0.5 % OP SOLN
OPHTHALMIC | Status: DC | PRN
  Administered 2017-09-04: 1 [drp] via OPHTHALMIC

## 2017-09-04 MED ORDER — ACETAMINOPHEN 160 MG/5ML PO SOLN: 325.0000 mg | ORAL | Status: DC | PRN

## 2017-09-04 MED ORDER — LIDOCAINE HCL (PF) 2 % IJ SOLN
INTRAOCULAR | Status: DC | PRN
  Administered 2017-09-04: 1 mL

## 2017-09-04 SURGICAL SUPPLY — 25 items
CANNULA ANT/CHMB 27GA (MISCELLANEOUS) ×3 IMPLANT
CARTRIDGE ABBOTT (MISCELLANEOUS) IMPLANT
GLOVE SURG LX 7.5 STRW (GLOVE) ×2
GLOVE SURG LX STRL 7.5 STRW (GLOVE) ×1 IMPLANT
GLOVE SURG TRIUMPH 8.0 PF LTX (GLOVE) ×3 IMPLANT
GOWN STRL REUS W/ TWL LRG LVL3 (GOWN DISPOSABLE) ×2 IMPLANT
GOWN STRL REUS W/TWL LRG LVL3 (GOWN DISPOSABLE) ×4
LENS IOL TECNIS ITEC 21.5 (Intraocular Lens) ×3 IMPLANT
MARKER SKIN DUAL TIP RULER LAB (MISCELLANEOUS) ×3 IMPLANT
NDL RETROBULBAR .5 NSTRL (NEEDLE) IMPLANT
NEEDLE FILTER BLUNT 18X 1/2SAF (NEEDLE) ×2
NEEDLE FILTER BLUNT 18X1 1/2 (NEEDLE) ×1 IMPLANT
PACK CATARACT BRASINGTON (MISCELLANEOUS) ×3 IMPLANT
PACK EYE AFTER SURG (MISCELLANEOUS) ×3 IMPLANT
PACK OPTHALMIC (MISCELLANEOUS) ×3 IMPLANT
RING MALYGIN 7.0 (MISCELLANEOUS) ×3 IMPLANT
SUT ETHILON 10-0 CS-B-6CS-B-6 (SUTURE)
SUT VICRYL  9 0 (SUTURE)
SUT VICRYL 9 0 (SUTURE) IMPLANT
SUTURE EHLN 10-0 CS-B-6CS-B-6 (SUTURE) IMPLANT
SYR 3ML LL SCALE MARK (SYRINGE) ×3 IMPLANT
SYR 5ML LL (SYRINGE) ×3 IMPLANT
SYR TB 1ML LUER SLIP (SYRINGE) ×3 IMPLANT
WATER STERILE IRR 250ML POUR (IV SOLUTION) ×3 IMPLANT
WIPE NON LINTING 3.25X3.25 (MISCELLANEOUS) ×3 IMPLANT

## 2017-09-04 NOTE — Op Note (Signed)
OPERATIVE NOTE  Laurie PLACZEK [id number] 09/04/2017  PREOPERATIVE DIAGNOSIS:   Nuclear sclerotic cataract left eye with miotic pupil      H25.12   POSTOPERATIVE DIAGNOSIS:   Nuclear sclerotic cataract left eye with miotic pupil.     PROCEDURE:  Phacoemulsification with posterior chamber intraocular lens implantation of the left eye which required pupil stretching with the Malyugin pupil expansion device   LENS:   Implant Name Type Inv. Item Serial No. Manufacturer Lot No. LRB No. Used  LENS IOL DIOP 21.5 - [id number] Intraocular Lens LENS IOL DIOP 21.5 [id number] AMO  Left 1        ULTRASOUND TIME: 14 % of 1 minutes, 30 seconds.  CDE 12.6   SURGEON:  Wyonia Hough, MD   ANESTHESIA: Topical with tetracaine drops and 2% Xylocaine jelly, augmented with 1% preservative-free intracameral lidocaine.   COMPLICATIONS:  None.   DESCRIPTION OF PROCEDURE:  The patient was identified in the holding room and transported to the operating room and placed in the supine position under the operating microscope.  The left eye was identified as the operative eye and it was prepped and draped in the usual sterile ophthalmic fashion.   A 1 millimeter clear-corneal paracentesis was made at the 1:30 position.  The anterior chamber was filled with Viscoat viscoelastic.  0.5 ml of preservative-free 1% lidocaine was injected into the anterior chamber.  A 2.4 millimeter keratome was used to make a near-clear corneal incision at the 10:30 position.  A Malyugin pupil expander was then placed through the main incision and into the anterior chamber of the eye.  The edge of the iris was secured on the lip of the pupil expander and it was released, thereby expanding the pupil to approximately 7 millimeters for completion of the cataract surgery.  Additional Viscoat was placed in the anterior chamber.  A cystotome and capsulorrhexis forceps were used to make a curvilinear capsulorrhexis.   Balanced salt solution  was used to hydrodissect and hydrodelineate the lens nucleus.   Phacoemulsification was used in stop and chop fashion to remove the lens, nucleus and epinucleus.  The remaining cortex was aspirated using the irrigation aspiration handpiece.  Additional Provisc was placed into the eye to distend the capsular bag for lens placement.  A lens was then injected into the capsular bag.  The pupil expanding ring was removed using a Kuglen hook and insertion device. The remaining viscoelastic was aspirated from the capsular bag and the anterior chamber.  The anterior chamber was filled with balanced salt solution to inflate to a physiologic pressure.   Wounds were hydrated with balanced salt solution.  The anterior chamber was inflated to a physiologic pressure with balanced salt solution.  No wound leaks were noted. Cefuroxime 0.1 ml of a 10mg /ml solution was injected into the anterior chamber for a dose of 1 mg of intracameral antibiotic at the completion of the case.   Timolol and Brimonidine drops were applied to the eye.  The patient was taken to the recovery room in stable condition without complications of anesthesia or surgery.  Laurie Harris 09/04/2017, 8:09 AM

## 2017-09-04 NOTE — Anesthesia Preprocedure Evaluation (Signed)
Anesthesia Evaluation  Patient identified by MRN, date of birth, ID band Patient awake    Reviewed: Allergy & Precautions, NPO status , Patient's Chart, lab work & pertinent test results  History of Anesthesia Complications Negative for: history of anesthetic complications  Airway Mallampati: I  TM Distance: >3 FB Neck ROM: Full    Dental no notable dental hx.    Pulmonary neg pulmonary ROS,    Pulmonary exam normal breath sounds clear to auscultation       Cardiovascular Exercise Tolerance: Good hypertension, + Peripheral Vascular Disease (carotid artery disease)  Normal cardiovascular exam Rhythm:Regular Rate:Normal     Neuro/Psych CVA (2016; left-sided weakness/instability)    GI/Hepatic   Endo/Other  diabetes, Type 2, Insulin Dependent  Renal/GU Renal disease (stage III CKD)     Musculoskeletal   Abdominal   Peds  Hematology negative hematology ROS (+)   Anesthesia Other Findings   Reproductive/Obstetrics                             Anesthesia Physical  Anesthesia Plan  ASA: III  Anesthesia Plan: MAC   Post-op Pain Management:    Induction: Intravenous  PONV Risk Score and Plan: 1 and Midazolam  Airway Management Planned: Natural Airway  Additional Equipment:   Intra-op Plan:   Post-operative Plan:   Informed Consent: I have reviewed the patients History and Physical, chart, labs and discussed the procedure including the risks, benefits and alternatives for the proposed anesthesia with the patient or authorized representative who has indicated his/her understanding and acceptance.     Plan Discussed with: CRNA  Anesthesia Plan Comments:         Anesthesia Quick Evaluation  

## 2017-09-04 NOTE — Transfer of Care (Signed)
Immediate Anesthesia Transfer of Care Note  Patient: Laurie Harris  Procedure(s) Performed: CATARACT EXTRACTION PHACO AND INTRAOCULAR LENS PLACEMENT (IOC) COMPLICATED LEFT DIABETIC (Left Eye)  Patient Location: PACU  Anesthesia Type: MAC  Level of Consciousness: awake, alert  and patient cooperative  Airway and Oxygen Therapy: Patient Spontanous Breathing and Patient connected to supplemental oxygen  Post-op Assessment: Post-op Vital signs reviewed, Patient's Cardiovascular Status Stable, Respiratory Function Stable, Patent Airway and No signs of Nausea or vomiting  Post-op Vital Signs: Reviewed and stable  Complications: No apparent anesthesia complications

## 2017-09-04 NOTE — H&P (Signed)
The History and Physical notes are on paper, have been signed, and are to be scanned. The patient remains stable and unchanged from the H&P.   Previous H&P reviewed, patient examined, and there are no changes.  Laurie Harris 09/04/2017 7:42 AM

## 2017-09-04 NOTE — Anesthesia Procedure Notes (Signed)
Procedure Name: MAC Date/Time: 09/04/2017 7:50 AM Performed by: Cameron Ali, CRNA Pre-anesthesia Checklist: Patient identified, Emergency Drugs available, Suction available, Timeout performed and Patient being monitored Patient Re-evaluated:Patient Re-evaluated prior to induction Oxygen Delivery Method: Nasal cannula Placement Confirmation: positive ETCO2

## 2017-09-04 NOTE — Anesthesia Postprocedure Evaluation (Signed)
Anesthesia Post Note  Patient: Laurie Harris  Procedure(s) Performed: CATARACT EXTRACTION PHACO AND INTRAOCULAR LENS PLACEMENT (IOC) COMPLICATED LEFT DIABETIC (Left Eye)  Patient location during evaluation: PACU Anesthesia Type: MAC Level of consciousness: awake and alert, oriented and patient cooperative Pain management: pain level controlled Vital Signs Assessment: post-procedure vital signs reviewed and stable Respiratory status: spontaneous breathing, nonlabored ventilation and respiratory function stable Cardiovascular status: blood pressure returned to baseline and stable Postop Assessment: adequate PO intake Anesthetic complications: no    Darrin Nipper

## 2017-09-09 DIAGNOSIS — E1122 Type 2 diabetes mellitus with diabetic chronic kidney disease: Secondary | ICD-10-CM | POA: Diagnosis not present

## 2017-09-09 DIAGNOSIS — R809 Proteinuria, unspecified: Secondary | ICD-10-CM | POA: Diagnosis not present

## 2017-09-09 DIAGNOSIS — I10 Essential (primary) hypertension: Secondary | ICD-10-CM | POA: Diagnosis not present

## 2017-09-09 DIAGNOSIS — N183 Chronic kidney disease, stage 3 (moderate): Secondary | ICD-10-CM | POA: Diagnosis not present

## 2017-09-16 DIAGNOSIS — H2511 Age-related nuclear cataract, right eye: Secondary | ICD-10-CM | POA: Diagnosis not present

## 2017-09-18 ENCOUNTER — Other Ambulatory Visit: Payer: Self-pay | Admitting: Family Medicine

## 2017-09-20 ENCOUNTER — Ambulatory Visit (INDEPENDENT_AMBULATORY_CARE_PROVIDER_SITE_OTHER): Payer: Medicare Other | Admitting: Family Medicine

## 2017-09-20 ENCOUNTER — Encounter: Payer: Self-pay | Admitting: Family Medicine

## 2017-09-20 ENCOUNTER — Ambulatory Visit (INDEPENDENT_AMBULATORY_CARE_PROVIDER_SITE_OTHER): Payer: Medicare Other

## 2017-09-20 ENCOUNTER — Other Ambulatory Visit: Payer: Self-pay

## 2017-09-20 ENCOUNTER — Other Ambulatory Visit: Payer: Self-pay | Admitting: Cardiovascular Disease

## 2017-09-20 VITALS — BP 140/64 | HR 83 | Temp 98.2°F | Wt 125.2 lb

## 2017-09-20 DIAGNOSIS — E785 Hyperlipidemia, unspecified: Secondary | ICD-10-CM

## 2017-09-20 DIAGNOSIS — Z794 Long term (current) use of insulin: Secondary | ICD-10-CM

## 2017-09-20 DIAGNOSIS — D7589 Other specified diseases of blood and blood-forming organs: Secondary | ICD-10-CM | POA: Insufficient documentation

## 2017-09-20 DIAGNOSIS — I700 Atherosclerosis of aorta: Secondary | ICD-10-CM | POA: Diagnosis not present

## 2017-09-20 DIAGNOSIS — E118 Type 2 diabetes mellitus with unspecified complications: Secondary | ICD-10-CM

## 2017-09-20 DIAGNOSIS — N281 Cyst of kidney, acquired: Secondary | ICD-10-CM | POA: Diagnosis not present

## 2017-09-20 DIAGNOSIS — I70299 Other atherosclerosis of native arteries of extremities, unspecified extremity: Secondary | ICD-10-CM

## 2017-09-20 DIAGNOSIS — I708 Atherosclerosis of other arteries: Secondary | ICD-10-CM

## 2017-09-20 DIAGNOSIS — I10 Essential (primary) hypertension: Secondary | ICD-10-CM

## 2017-09-20 DIAGNOSIS — I6523 Occlusion and stenosis of bilateral carotid arteries: Secondary | ICD-10-CM | POA: Diagnosis not present

## 2017-09-20 DIAGNOSIS — I6529 Occlusion and stenosis of unspecified carotid artery: Secondary | ICD-10-CM

## 2017-09-20 MED ORDER — BENAZEPRIL HCL 5 MG PO TABS: ORAL_TABLET | ORAL | 2 refills | Status: DC

## 2017-09-20 NOTE — Assessment & Plan Note (Signed)
Likely cyst noted on prior CT scan.  We will send a message to 1 of our urology colleagues to see if they agree with this interpretation and to see if the patient needs to be seen for this.

## 2017-09-20 NOTE — Progress Notes (Signed)
Tommi Rumps, MD Phone: [phone]  Laurie Harris is a 77 y.o. female who presents today for f/u.  Hypertension: Ranging from 96-146/44-70.  Most of her numbers range from 253-664Q systolically.  Mostly in the upper 03K-74 diastolically.  She continues on benazepril 2.5 mg daily.  No chest pain, shortness of breath, or edema.  Diabetes: Ranging from 110-204 though mostly in the mid 100s.  She continues on 19 units in the morning and 18 units in the evening of 75/25.  No polyuria or polydipsia.  No hypoglycemia.  She followed up with hematology regarding her elevated MCV.  Workup was unremarkable and they plan to follow-up at 6 months.    She was previously referred to vascular surgery as well given prior CT scan findings.  She notes no leg discomfort.  She canceled that appointment as it was not the vascular surgeon she wanted to see.  She notes no further consistent abdominal discomfort.  CT scan did reveal likely renal cyst though this is too small to characterize.  Social History   Tobacco Use  Smoking Status Never Smoker  Smokeless Tobacco Never Used     ROS see history of present illness  Objective  Physical Exam Vitals:   09/20/17 1025  BP: 140/64  Pulse: 83  Temp: 98.2 F (36.8 C)  SpO2: 96%    BP Readings from Last 3 Encounters:  09/20/17 140/64  09/04/17 135/62  08/13/17 (!) 166/72   Wt Readings from Last 3 Encounters:  09/20/17 125 lb 3.2 oz (56.8 kg)  09/04/17 125 lb (56.7 kg)  08/13/17 125 lb 6.4 oz (56.9 kg)    Physical Exam  Constitutional: No distress.  Cardiovascular: Normal rate, regular rhythm and normal heart sounds.  2+ DP pulses, faint palpable PT pulses  Pulmonary/Chest: Effort normal and breath sounds normal.  Musculoskeletal: She exhibits no edema.  Neurological: She is alert. Gait normal.  Skin: Skin is warm and dry. She is not diaphoretic.     Assessment/Plan: Please see individual problem list.  Benign essential  HTN Her blood pressure has been relatively stable and well-controlled.  She does not have as many low diastolics as prior.  I discussed the concern with low diastolics would be lightheadedness and syncope as well as decreased tissue perfusion.  She will continue benazepril and continue to monitor her blood pressures.  Diabetes mellitus type 2, controlled, with complications Seems to be mostly well controlled.  She will return next week for fasting labs.  Continue current regimen.  Aorto-iliac atherosclerosis ([place]) Noted on recent CT scan.  Patient canceled with vascular surgery in [place].  Will refer to vascular surgery and [place].  Renal cyst Likely cyst noted on prior CT scan.  We will send a message to 1 of our urology colleagues to see if they agree with this interpretation and to see if the patient needs to be seen for this.  Macrocytosis without anemia She will continue to follow with hematology.   Orders Placed This Encounter  Procedures  . Lipid panel    Standing Status:   Future    Standing Expiration Date:   09/20/2018  . Hemoglobin A1c    Standing Status:   Future    Standing Expiration Date:   09/20/2018  . Hepatic function panel    Standing Status:   Future    Standing Expiration Date:   09/20/2018  . Ambulatory referral to Vascular Surgery    Referral Priority:   Routine    Referral Type:  Surgical    Referral Reason:   Specialty Services Required    Requested Specialty:   Vascular Surgery    Number of Visits Requested:   1    Meds ordered this encounter  Medications  . benazepril (LOTENSIN) 5 MG tablet    Sig: Take 2.5mg  (half a tablet) by mouth at night.    Dispense:  45 tablet    Refill:  2     Tommi Rumps, MD [place]

## 2017-09-20 NOTE — Assessment & Plan Note (Signed)
Seems to be mostly well controlled.  She will return next week for fasting labs.  Continue current regimen.

## 2017-09-20 NOTE — Patient Instructions (Signed)
Nice to see you. We will have you return for fasting lab work next week.  We will get you referred to vascular surgery in [facility] for evaluation.

## 2017-09-20 NOTE — Progress Notes (Signed)
foll

## 2017-09-20 NOTE — Assessment & Plan Note (Signed)
Noted on recent CT scan.  Patient canceled with vascular surgery in [place].  Will refer to vascular surgery and [place].

## 2017-09-20 NOTE — Assessment & Plan Note (Signed)
She will continue to follow with hematology 

## 2017-09-20 NOTE — Assessment & Plan Note (Signed)
Her blood pressure has been relatively stable and well-controlled.  She does not have as many low diastolics as prior.  I discussed the concern with low diastolics would be lightheadedness and syncope as well as decreased tissue perfusion.  She will continue benazepril and continue to monitor her blood pressures.

## 2017-09-21 ENCOUNTER — Telehealth: Payer: Self-pay | Admitting: Family Medicine

## 2017-09-21 DIAGNOSIS — N281 Cyst of kidney, acquired: Secondary | ICD-10-CM

## 2017-09-21 NOTE — Telephone Encounter (Signed)
-----   Message from Nickie Retort, MD sent at 09/20/2017  3:01 PM EST ----- Dr. Caryl Bis,  It is likely a very small simple cyst that does not appear to uptake contrast. She probably does not need any further follow up for this. I do, however, offer these patients a renal ultrasound in one year as peace of mind though to ensure stability.  Hope this helps,  Aaron Edelman   ----- Message ----- From: [name], MD Sent: 09/20/2017  12:35 PM To: Nickie Retort, MD  Hi Dr Pilar Jarvis,  I wanted to see if you could help me with a question. This patient had a CT scan late last year that revealed a possible renal cyst though they noted it was too small to fully characterize. I have included the interpretation below. I wanted to see if this would need any follow-up given that they were not able to fully characterize it on the CT. Thanks for your help.  Tommi Rumps  "There is a 5 mm area of decreased attenuation in the lateral upper pole left kidney which is too small to accurately characterize. Statistically, it most likely represents a small cyst."

## 2017-09-21 NOTE — Telephone Encounter (Signed)
Please let the patient know I heard back from urology.  He advised that it was likely a simple cyst that was seen in her kidney.  It does not need further follow-up though if she would like to monitor we could do an ultrasound to follow-up on this a year after her CT scan.

## 2017-09-23 LAB — VAS US CAROTID
LEFT ECA DIAS: -14 cm/s
LEFT VERTEBRAL DIAS: -21 cm/s
LICADDIAS: -27 cm/s
LICAPDIAS: -18 cm/s
LICAPSYS: -71 cm/s
Left CCA dist dias: -23 cm/s
Left CCA dist sys: -137 cm/s
Left CCA prox dias: 17 cm/s
Left CCA prox sys: 103 cm/s
Left ICA dist sys: -102 cm/s
RCCAPDIAS: 20 cm/s
RIGHT ECA DIAS: -44 cm/s
RIGHT VERTEBRAL DIAS: -13 cm/s
Right CCA prox sys: 118 cm/s
Right cca dist sys: -106 cm/s

## 2017-09-23 NOTE — Telephone Encounter (Signed)
Noted. We will plan on ordering that at follow-up around October.

## 2017-09-23 NOTE — Telephone Encounter (Signed)
Patient notified and would like to follow up on this a year after ct

## 2017-09-24 ENCOUNTER — Other Ambulatory Visit: Payer: Self-pay

## 2017-09-24 DIAGNOSIS — I2510 Atherosclerotic heart disease of native coronary artery without angina pectoris: Secondary | ICD-10-CM

## 2017-09-25 ENCOUNTER — Other Ambulatory Visit (INDEPENDENT_AMBULATORY_CARE_PROVIDER_SITE_OTHER): Payer: Medicare Other

## 2017-09-25 DIAGNOSIS — Z794 Long term (current) use of insulin: Secondary | ICD-10-CM | POA: Diagnosis not present

## 2017-09-25 DIAGNOSIS — E785 Hyperlipidemia, unspecified: Secondary | ICD-10-CM | POA: Diagnosis not present

## 2017-09-25 DIAGNOSIS — E118 Type 2 diabetes mellitus with unspecified complications: Secondary | ICD-10-CM

## 2017-09-25 LAB — HEPATIC FUNCTION PANEL
ALT: 24 U/L (ref 0–35)
AST: 24 U/L (ref 0–37)
Albumin: 4.2 g/dL (ref 3.5–5.2)
Alkaline Phosphatase: 64 U/L (ref 39–117)
BILIRUBIN DIRECT: 0.2 mg/dL (ref 0.0–0.3)
BILIRUBIN TOTAL: 0.6 mg/dL (ref 0.2–1.2)
Total Protein: 7.3 g/dL (ref 6.0–8.3)

## 2017-09-25 LAB — LIPID PANEL
CHOL/HDL RATIO: 4
Cholesterol: 140 mg/dL (ref 0–200)
HDL: 34.2 mg/dL — AB (ref >39.00)
LDL Cholesterol: 67 mg/dL (ref 0–99)
NONHDL: 105.61
Triglycerides: 191 mg/dL — ABNORMAL HIGH (ref 0.0–149.0)
VLDL: 38.2 mg/dL (ref 0.0–40.0)

## 2017-09-25 LAB — HEMOGLOBIN A1C: Hgb A1c MFr Bld: 7.3 % — ABNORMAL HIGH (ref 4.6–6.5)

## 2017-09-26 ENCOUNTER — Other Ambulatory Visit: Payer: Self-pay

## 2017-09-26 ENCOUNTER — Encounter: Payer: Self-pay | Admitting: *Deleted

## 2017-10-01 NOTE — Discharge Instructions (Signed)

## 2017-10-02 ENCOUNTER — Ambulatory Visit
Admission: RE | Admit: 2017-10-02 | Discharge: 2017-10-02 | Disposition: A | Discharge status: Home / Self Care | Payer: Medicare Other | Source: Ambulatory Visit | Attending: Ophthalmology | Admitting: Ophthalmology

## 2017-10-02 ENCOUNTER — Telehealth: Payer: Self-pay | Admitting: Oncology

## 2017-10-02 ENCOUNTER — Ambulatory Visit: Payer: Medicare Other | Admitting: Anesthesiology

## 2017-10-02 ENCOUNTER — Encounter
Admission: RE | Disposition: A | Discharge status: Home / Self Care | Payer: Self-pay | Source: Ambulatory Visit | Attending: Ophthalmology

## 2017-10-02 DIAGNOSIS — H5703 Miosis: Secondary | ICD-10-CM | POA: Diagnosis not present

## 2017-10-02 DIAGNOSIS — E7800 Pure hypercholesterolemia, unspecified: Secondary | ICD-10-CM | POA: Diagnosis not present

## 2017-10-02 DIAGNOSIS — Z794 Long term (current) use of insulin: Secondary | ICD-10-CM | POA: Insufficient documentation

## 2017-10-02 DIAGNOSIS — E1122 Type 2 diabetes mellitus with diabetic chronic kidney disease: Secondary | ICD-10-CM | POA: Insufficient documentation

## 2017-10-02 DIAGNOSIS — I69354 Hemiplegia and hemiparesis following cerebral infarction affecting left non-dominant side: Secondary | ICD-10-CM | POA: Diagnosis not present

## 2017-10-02 DIAGNOSIS — H2511 Age-related nuclear cataract, right eye: Secondary | ICD-10-CM | POA: Insufficient documentation

## 2017-10-02 DIAGNOSIS — I129 Hypertensive chronic kidney disease with stage 1 through stage 4 chronic kidney disease, or unspecified chronic kidney disease: Secondary | ICD-10-CM | POA: Insufficient documentation

## 2017-10-02 DIAGNOSIS — N183 Chronic kidney disease, stage 3 (moderate): Secondary | ICD-10-CM | POA: Insufficient documentation

## 2017-10-02 DIAGNOSIS — Z888 Allergy status to other drugs, medicaments and biological substances status: Secondary | ICD-10-CM | POA: Insufficient documentation

## 2017-10-02 DIAGNOSIS — E1151 Type 2 diabetes mellitus with diabetic peripheral angiopathy without gangrene: Secondary | ICD-10-CM | POA: Diagnosis not present

## 2017-10-02 HISTORY — PX: CATARACT EXTRACTION W/PHACO: SHX586

## 2017-10-02 LAB — GLUCOSE, CAPILLARY
GLUCOSE-CAPILLARY: 185 mg/dL — AB (ref 65–99)
Glucose-Capillary: 176 mg/dL — ABNORMAL HIGH (ref 65–99)

## 2017-10-02 SURGERY — PHACOEMULSIFICATION, CATARACT, WITH IOL INSERTION
Anesthesia: Monitor Anesthesia Care | Laterality: Right

## 2017-10-02 MED ORDER — PROMETHAZINE HCL 25 MG/ML IJ SOLN: 6.2500 mg | INTRAMUSCULAR | Status: DC | PRN

## 2017-10-02 MED ORDER — FENTANYL CITRATE (PF) 100 MCG/2ML IJ SOLN: 25.0000 ug | INTRAMUSCULAR | Status: DC | PRN

## 2017-10-02 MED ORDER — OXYCODONE HCL 5 MG PO TABS: 5.0000 mg | ORAL_TABLET | Freq: Once | ORAL | Status: DC | PRN

## 2017-10-02 MED ORDER — FENTANYL CITRATE (PF) 100 MCG/2ML IJ SOLN
INTRAMUSCULAR | Status: DC | PRN
  Administered 2017-10-02: 50 ug via INTRAVENOUS

## 2017-10-02 MED ORDER — EPINEPHRINE PF 1 MG/ML IJ SOLN
INTRAOCULAR | Status: DC | PRN
  Administered 2017-10-02: 65 mL via OPHTHALMIC

## 2017-10-02 MED ORDER — NA HYALUR & NA CHOND-NA HYALUR 0.4-0.35 ML IO KIT
PACK | INTRAOCULAR | Status: DC | PRN
  Administered 2017-10-02: 1 mL via INTRAOCULAR

## 2017-10-02 MED ORDER — OXYCODONE HCL 5 MG/5ML PO SOLN: 5.0000 mg | Freq: Once | ORAL | Status: DC | PRN

## 2017-10-02 MED ORDER — LIDOCAINE HCL (PF) 2 % IJ SOLN
INTRAOCULAR | Status: DC | PRN
  Administered 2017-10-02: 1 mL via INTRAMUSCULAR

## 2017-10-02 MED ORDER — MOXIFLOXACIN HCL 0.5 % OP SOLN
1.0000 [drp] | OPHTHALMIC | Status: DC | PRN
  Administered 2017-10-02 (×3): 1 [drp] via OPHTHALMIC

## 2017-10-02 MED ORDER — MIDAZOLAM HCL 2 MG/2ML IJ SOLN
INTRAMUSCULAR | Status: DC | PRN
  Administered 2017-10-02: 2 mg via INTRAVENOUS

## 2017-10-02 MED ORDER — CEFUROXIME OPHTHALMIC INJECTION 1 MG/0.1 ML
INJECTION | OPHTHALMIC | Status: DC | PRN
  Administered 2017-10-02: 0.1 mL via INTRACAMERAL

## 2017-10-02 MED ORDER — MEPERIDINE HCL 25 MG/ML IJ SOLN: 6.2500 mg | INTRAMUSCULAR | Status: DC | PRN

## 2017-10-02 MED ORDER — ARMC OPHTHALMIC DILATING DROPS
1.0000 "application " | OPHTHALMIC | Status: DC | PRN
  Administered 2017-10-02 (×3): 1 via OPHTHALMIC

## 2017-10-02 MED ORDER — LACTATED RINGERS IV SOLN: 10.0000 mL/h | INTRAVENOUS | Status: DC

## 2017-10-02 MED ORDER — BRIMONIDINE TARTRATE-TIMOLOL 0.2-0.5 % OP SOLN
OPHTHALMIC | Status: DC | PRN
  Administered 2017-10-02: 1 [drp] via OPHTHALMIC

## 2017-10-02 SURGICAL SUPPLY — 25 items
CANNULA ANT/CHMB 27GA (MISCELLANEOUS) ×3 IMPLANT
CARTRIDGE ABBOTT (MISCELLANEOUS) IMPLANT
GLOVE SURG LX 7.5 STRW (GLOVE) ×2
GLOVE SURG LX STRL 7.5 STRW (GLOVE) ×1 IMPLANT
GLOVE SURG TRIUMPH 8.0 PF LTX (GLOVE) ×3 IMPLANT
GOWN STRL REUS W/ TWL LRG LVL3 (GOWN DISPOSABLE) ×2 IMPLANT
GOWN STRL REUS W/TWL LRG LVL3 (GOWN DISPOSABLE) ×4
LENS IOL TECNIS ITEC 21.5 (Intraocular Lens) ×3 IMPLANT
MARKER SKIN DUAL TIP RULER LAB (MISCELLANEOUS) ×3 IMPLANT
NDL RETROBULBAR .5 NSTRL (NEEDLE) IMPLANT
NEEDLE FILTER BLUNT 18X 1/2SAF (NEEDLE) ×2
NEEDLE FILTER BLUNT 18X1 1/2 (NEEDLE) ×1 IMPLANT
PACK CATARACT BRASINGTON (MISCELLANEOUS) ×3 IMPLANT
PACK EYE AFTER SURG (MISCELLANEOUS) ×3 IMPLANT
PACK OPTHALMIC (MISCELLANEOUS) ×3 IMPLANT
RING MALYGIN (MISCELLANEOUS) ×3 IMPLANT
SUT ETHILON 10-0 CS-B-6CS-B-6 (SUTURE)
SUT VICRYL  9 0 (SUTURE)
SUT VICRYL 9 0 (SUTURE) IMPLANT
SUTURE EHLN 10-0 CS-B-6CS-B-6 (SUTURE) IMPLANT
SYR 3ML LL SCALE MARK (SYRINGE) ×3 IMPLANT
SYR 5ML LL (SYRINGE) ×3 IMPLANT
SYR TB 1ML LUER SLIP (SYRINGE) ×3 IMPLANT
WATER STERILE IRR 500ML POUR (IV SOLUTION) ×3 IMPLANT
WIPE NON LINTING 3.25X3.25 (MISCELLANEOUS) ×3 IMPLANT

## 2017-10-02 NOTE — Anesthesia Preprocedure Evaluation (Signed)
Anesthesia Evaluation  Patient identified by MRN, date of birth, ID band Patient awake    Reviewed: Allergy & Precautions, NPO status , Patient's Chart, lab work & pertinent test results  History of Anesthesia Complications Negative for: history of anesthetic complications  Airway Mallampati: I  TM Distance: >3 FB Neck ROM: Full    Dental no notable dental hx.    Pulmonary neg pulmonary ROS,    Pulmonary exam normal breath sounds clear to auscultation       Cardiovascular Exercise Tolerance: Good hypertension, + Peripheral Vascular Disease (carotid artery disease)  Normal cardiovascular exam Rhythm:Regular Rate:Normal     Neuro/Psych CVA (2016; left-sided weakness/instability)    GI/Hepatic   Endo/Other  diabetes, Type 2, Insulin Dependent  Renal/GU Renal disease (stage III CKD)     Musculoskeletal   Abdominal   Peds  Hematology negative hematology ROS (+)   Anesthesia Other Findings   Reproductive/Obstetrics                             Anesthesia Physical  Anesthesia Plan  ASA: III  Anesthesia Plan: MAC   Post-op Pain Management:    Induction: Intravenous  PONV Risk Score and Plan: 1 and Midazolam  Airway Management Planned: Natural Airway  Additional Equipment:   Intra-op Plan:   Post-operative Plan:   Informed Consent: I have reviewed the patients History and Physical, chart, labs and discussed the procedure including the risks, benefits and alternatives for the proposed anesthesia with the patient or authorized representative who has indicated his/her understanding and acceptance.     Plan Discussed with: CRNA  Anesthesia Plan Comments:         Anesthesia Quick Evaluation

## 2017-10-02 NOTE — Transfer of Care (Signed)
Immediate Anesthesia Transfer of Care Note  Patient: [name]  Procedure(s) Performed: CATARACT EXTRACTION PHACO AND INTRAOCULAR LENS PLACEMENT (IOC) COMPLICATED  RIGHT DIABETIC (Right )  Patient Location: PACU  Anesthesia Type: MAC  Level of Consciousness: awake, alert  and patient cooperative  Airway and Oxygen Therapy: Patient Spontanous Breathing and Patient connected to supplemental oxygen  Post-op Assessment: Post-op Vital signs reviewed, Patient's Cardiovascular Status Stable, Respiratory Function Stable, Patent Airway and No signs of Nausea or vomiting  Post-op Vital Signs: Reviewed and stable  Complications: No apparent anesthesia complications

## 2017-10-02 NOTE — Anesthesia Procedure Notes (Signed)
Procedure Name: MAC Date/Time: 10/02/2017 8:11 AM Performed by: Janna Arch, CRNA Pre-anesthesia Checklist: Patient identified, Emergency Drugs available, Suction available and Patient being monitored Patient Re-evaluated:Patient Re-evaluated prior to induction Oxygen Delivery Method: Nasal cannula

## 2017-10-02 NOTE — Op Note (Signed)
OPERATIVE NOTE  FAY BAGG [id number] 10/02/2017   PREOPERATIVE DIAGNOSIS:    Nuclear Sclerotic Cataract Right eye with miotic pupil.        H25.11  POSTOPERATIVE DIAGNOSIS: Nuclear Sclerotic Cataract Right eye with miotic pupil.          PROCEDURE:  Phacoemusification with posterior chamber intraocular lens placement of the right eye which required pupil stretching with the Malyugin pupil expansion device.  LENS:   Implant Name Type Inv. Item Serial No. Manufacturer Lot No. LRB No. Used  LENS IOL DIOP 21.5 - [id number] Intraocular Lens LENS IOL DIOP 21.5 [id number] AMO  Right 1       ULTRASOUND TIME: 16 % of 1 minutes 18 seconds, CDE 12.2  SURGEON:  Wyonia Hough, MD   ANESTHESIA:  Topical with tetracaine drops and 2% Xylocaine jelly, augmented with 1% preservative-free intracameral lidocaine.   COMPLICATIONS:  None.   DESCRIPTION OF PROCEDURE:  The patient was identified in the holding room and transported to the operating room and placed in the supine position under the operating microscope. Theright eye was identified as the operative eye and it was prepped and draped in the usual sterile ophthalmic fashion.   A 1 millimeter clear-corneal paracentesis was made at the 12:00 position.  0.5 ml of preservative-free 1% lidocaine was injected into the anterior chamber. The anterior chamber was filled with Viscoat viscoelastic.  A 2.4 millimeter keratome was used to make a near-clear corneal incision at the 9:00 position. A Malyugin pupil expander was then placed through the main incision and into the anterior chamber of the eye.  The edge of the iris was secured on the lip of the pupil expander and it was released, thereby expanding the pupil to approximately 7 millimeters for completion of the cataract surgery.  Additional Viscoat was placed in the anterior chamber.  A cystotome and capsulorrhexis forceps were used to make a curvilinear capsulorrhexis.   Balanced salt  solution was used to hydrodissect and hydrodelineate the lens nucleus.   Phacoemulsification was used in stop and chop fashion to remove the lens, nucleus and epinucleus.  The remaining cortex was aspirated using the irrigation aspiration handpiece.  Additional Provisc was placed into the eye to distend the capsular bag for lens placement.  A lens was then injected into the capsular bag.  The pupil expanding ring was removed using a Kuglen hook and insertion device. The remaining viscoelastic was aspirated from the capsular bag and the anterior chamber.  The anterior chamber was filled with balanced salt solution to inflate to a physiologic pressure.  Wounds were hydrated with balanced salt solution.  The anterior chamber was inflated to a physiologic pressure with balanced salt solution.  No wound leaks were noted.Cefuroxime 0.1 ml of a 10mg /ml solution was injected into the anterior chamber for a dose of 1 mg of intracameral antibiotic at the completion of the case. Timolol and Brimonidine drops were applied to the eye.  The patient was taken to the recovery room in stable condition without complications of anesthesia or surgery.  Kathe Wirick 10/02/2017, 8:30 AM

## 2017-10-02 NOTE — Telephone Encounter (Signed)
Lab/MD appts rschd per MD on PAL. Appts rschd and conf with patient. MF

## 2017-10-02 NOTE — Anesthesia Preprocedure Evaluation (Deleted)
Anesthesia Evaluation    Airway        Dental   Pulmonary           Cardiovascular hypertension,      Neuro/Psych    GI/Hepatic   Endo/Other  diabetes  Renal/GU      Musculoskeletal   Abdominal   Peds  Hematology   Anesthesia Other Findings   Reproductive/Obstetrics                                                              Anesthesia Evaluation  Patient identified by MRN, date of birth, ID band Patient awake    Reviewed: Allergy & Precautions, NPO status , Patient's Chart, lab work & pertinent test results  History of Anesthesia Complications Negative for: history of anesthetic complications  Airway Mallampati: I  TM Distance: >3 FB Neck ROM: Full    Dental no notable dental hx.    Pulmonary neg pulmonary ROS,    Pulmonary exam normal breath sounds clear to auscultation       Cardiovascular Exercise Tolerance: Good hypertension, + Peripheral Vascular Disease (carotid artery disease)  Normal cardiovascular exam Rhythm:Regular Rate:Normal     Neuro/Psych CVA (2016; left-sided weakness/instability)    GI/Hepatic   Endo/Other  diabetes, Type 2, Insulin Dependent  Renal/GU Renal disease (stage III CKD)     Musculoskeletal   Abdominal   Peds  Hematology negative hematology ROS (+)   Anesthesia Other Findings   Reproductive/Obstetrics                             Anesthesia Physical Anesthesia Plan  ASA: III  Anesthesia Plan: MAC   Post-op Pain Management:    Induction: Intravenous  PONV Risk Score and Plan: 1 and Midazolam  Airway Management Planned: Natural Airway  Additional Equipment:   Intra-op Plan:   Post-operative Plan:   Informed Consent: I have reviewed the patients History and Physical, chart, labs and discussed the procedure including the risks, benefits and alternatives for the proposed anesthesia  with the patient or authorized representative who has indicated his/her understanding and acceptance.     Plan Discussed with: CRNA  Anesthesia Plan Comments:         Anesthesia Quick Evaluation  Anesthesia Physical Anesthesia Plan Anesthesia Quick Evaluation

## 2017-10-02 NOTE — Anesthesia Postprocedure Evaluation (Signed)
Anesthesia Post Note  Patient: Laurie Harris  Procedure(s) Performed: CATARACT EXTRACTION PHACO AND INTRAOCULAR LENS PLACEMENT (IOC) COMPLICATED  RIGHT DIABETIC (Right )  Patient location during evaluation: PACU Anesthesia Type: MAC Level of consciousness: awake and alert Pain management: pain level controlled Vital Signs Assessment: post-procedure vital signs reviewed and stable Respiratory status: spontaneous breathing, nonlabored ventilation, respiratory function stable and patient connected to nasal cannula oxygen Cardiovascular status: blood pressure returned to baseline and stable Postop Assessment: no apparent nausea or vomiting Anesthetic complications: no    SCOURAS, NICOLE ELAINE

## 2017-10-02 NOTE — H&P (Signed)
The History and Physical notes are on paper, have been signed, and are to be scanned. The patient remains stable and unchanged from the H&P.   Previous H&P reviewed, patient examined, and there are no changes.  [name] 10/02/2017 7:40 AM

## 2017-10-11 ENCOUNTER — Other Ambulatory Visit: Payer: Self-pay

## 2017-10-11 ENCOUNTER — Ambulatory Visit (INDEPENDENT_AMBULATORY_CARE_PROVIDER_SITE_OTHER): Payer: Medicare Other | Admitting: Vascular Surgery

## 2017-10-11 ENCOUNTER — Encounter: Payer: Self-pay | Admitting: Vascular Surgery

## 2017-10-11 VITALS — BP 143/69 | HR 74 | Temp 97.2°F | Resp 14 | Ht 66.0 in | Wt 125.0 lb

## 2017-10-11 DIAGNOSIS — I700 Atherosclerosis of aorta: Secondary | ICD-10-CM | POA: Diagnosis not present

## 2017-10-11 DIAGNOSIS — I708 Atherosclerosis of other arteries: Principal | ICD-10-CM

## 2017-10-11 DIAGNOSIS — I70299 Other atherosclerosis of native arteries of extremities, unspecified extremity: Secondary | ICD-10-CM

## 2017-10-11 NOTE — Progress Notes (Signed)
Requested by:  [name], MD [address] [place], [place] [address]  Reason for consultation: aortic atherosclerosis    History of Present Illness   Laurie Harris is a 77 y.o. (04-10-1941) female who presents with cc: prior appendicular abscess.  This patient with known appendicular abscess underwent drainage without interval appendectomy.  She had an interval CT abd to evaluate for additional pathology.  On that scan, she was found to have atherosclerotic changes in her aorta.  At this point, the patient denies any sx of mesenteric ischemia.  She denies intermittent claudication and rest pain.  Her atherosclerotic risk factors include: CKD Stage 3, DM, HTN, HLD  Past Medical History:  Diagnosis Date  . Carotid arterial disease ([facility])    a. 09/2016 Carotid U/S: R carotid bifurcation dzs of 50-69%, L carotid bifurcation dzs of <50%. Patent vertebral arteries w/ anegrade flow.  . Chicken pox   . CKD (chronic kidney disease), stage III (H[facility])    functioning at 46%  . Diabetes mellitus without complication ([facility])   . Hemorrhagic stroke ([facility]) 12/31/2014   a. in the setting of warfarin therapy.  . Hyperlipidemia   . Hypertension   . Syncope    a. 09/2016 Echo: EF 55-60%, no rwma, Gr1 DD.    Past Surgical History:  Procedure Laterality Date  . ABCESS DRAINAGE  06/14/2016   appendix  . APPENDECTOMY  06/13/2016  . CATARACT EXTRACTION W/PHACO Left 09/04/2017   Procedure: CATARACT EXTRACTION PHACO AND INTRAOCULAR LENS PLACEMENT ([facility]) COMPLICATED LEFT DIABETIC;  Surgeon: Leandrew Koyanagi, MD;  Location: [facility];  Service: Ophthalmology;  Laterality: Left;  Diabetic - insulin  . CATARACT EXTRACTION W/PHACO Right 10/02/2017   Procedure: CATARACT EXTRACTION PHACO AND INTRAOCULAR LENS PLACEMENT ([facility]) COMPLICATED  RIGHT DIABETIC;  Surgeon: Leandrew Koyanagi, MD;  Location: [facility];  Service: Ophthalmology;  Laterality: Right;  Diabetic - insulin  .  COLONOSCOPY    . NECK SURGERY       Social History   Socioeconomic History  . Marital status: Married    Spouse name: Not on file  . Number of children: Not on file  . Years of education: Not on file  . Highest education level: Not on file  Social Needs  . Financial resource strain: Not on file  . Food insecurity - worry: Not on file  . Food insecurity - inability: Not on file  . Transportation needs - medical: Not on file  . Transportation needs - non-medical: Not on file  Occupational History  . Not on file  Tobacco Use  . Smoking status: Never Smoker  . Smokeless tobacco: Never Used  Substance and Sexual Activity  . Alcohol use: No    Alcohol/week: 0.0 oz  . Drug use: No  . Sexual activity: Not on file  Other Topics Concern  . Not on file  Social History Narrative  . Not on file    Family History  Problem Relation Age of Onset  . Diabetes Mother   . Dementia Mother   . Heart disease Mother   . Cancer Mother        lung  . Cancer Father        colon  . Stroke Daughter   . Hyperlipidemia Daughter   . Breast cancer Neg Hx     Current Outpatient Medications  Medication Sig Dispense Refill  . alendronate (FOSAMAX) 70 MG tablet TAKE 1 TABLET EVERY 7 DAYS WITH A FULL GLASS  OF WATER ON AN EMPTY STOMACH DO NOT LIE DOWN FOR AT LEAST 30 MIN 4 tablet 3  . aspirin EC 81 MG tablet Take 81 mg by mouth daily.    Marland Kitchen atorvastatin (LIPITOR) 80 MG tablet TAKE ONE TABLET BY MOUTH EVERY DAY 90 tablet 2  . benazepril (LOTENSIN) 5 MG tablet Take 2.5mg  (half a tablet) by mouth at night. 45 tablet 2  . Calcium Carbonate (CALCIUM 600 PO) Take by mouth.    . calcium-vitamin D (OSCAL WITH D) 500-200 MG-UNIT tablet Take 1 tablet by mouth 2 (two) times daily.    . cholecalciferol (VITAMIN D) 1000 units tablet Take 2,000 Units by mouth daily.    . fenofibrate 160 MG tablet TAKE ONE TABLET BY MOUTH EVERY DAY 90 tablet 1  . Insulin Lispro Prot & Lispro (HUMALOG MIX 75/25 KWIKPEN) (75-25)  100 UNIT/ML Kwikpen INJECT 19 units in the a.m. and 18 units in the p.m. AS DIRECTED 15 mL 2  . Insulin Pen Needle (NOVOFINE PLUS) 32G X 4 MM MISC USE TWICE DAILY AS DIRECTED 100 each 6  . Multiple Vitamin (MULTIVITAMIN WITH MINERALS) TABS tablet Take 1 tablet by mouth daily.    . Omega-3 Fatty Acids (FISH OIL) 1200 MG CAPS Take 1,200 mg by mouth daily.    . pantoprazole (PROTONIX) 40 MG tablet TAKE 1 TABLET BY MOUTH DAILY 90 tablet 2   No current facility-administered medications for this visit.     Allergies  Allergen Reactions  . Warfarin And Related     Possible stroke    REVIEW OF SYSTEMS (negative unless checked):   Cardiac:  []  Chest pain or chest pressure? []  Shortness of breath upon activity? []  Shortness of breath when lying flat? []  Irregular heart rhythm?  Vascular:  []  Pain in calf, thigh, or hip brought on by walking? []  Pain in feet at night that wakes you up from your sleep? []  Blood clot in your veins? []  Leg swelling?  Pulmonary:  []  Oxygen at home? []  Productive cough? []  Wheezing?  Neurologic:  []  Sudden weakness in arms or legs? []  Sudden numbness in arms or legs? []  Sudden onset of difficult speaking or slurred speech? []  Temporary loss of vision in one eye? []  Problems with dizziness?  Gastrointestinal:  []  Blood in stool? []  Vomited blood?  Genitourinary:  []  Burning when urinating? []  Blood in urine?  Psychiatric:  []  Major depression  Hematologic:  []  Bleeding problems? []  Problems with blood clotting?  Dermatologic:  []  Rashes or ulcers?  Constitutional:  []  Fever or chills?  Ear/Nose/Throat:  []  Change in hearing? []  Nose bleeds? []  Sore throat?  Musculoskeletal:  []  Back pain? []  Joint pain? []  Muscle pain?   For VQI Use Only   PRE-ADM LIVING Home  AMB STATUS Ambulatory  CAD Sx None  PRIOR CHF None  STRESS TEST No    Physical Examination     Vitals:   10/11/17 0925 10/11/17 0930  BP: (!) 142/72 (!)  143/69  Pulse: 72 74  Resp: 14   Temp: (!) 97.2 F (36.2 C)   TempSrc: Oral   SpO2: 96%   Weight: 125 lb (56.7 kg)   Height: 5\' 6"  (1.676 m)    Body mass index is 20.18 kg/m.  General Alert, O x 3, WD, Elderly  Head [place]/AT,    Ear/Nose/ Throat Hearing grossly intact, nares without erythema or drainage, oropharynx without Erythema or Exudate, Mallampati score: 3,   Eyes PERRLA, EOMI,  Neck Supple, mid-line trachea,    Pulmonary Sym exp, good B air movt, CTA B  Cardiac RRR, Nl S1, S2, no Murmurs, No rubs, No S3,S4  Vascular Vessel Right Left  Radial Palpable Palpable  Brachial Palpable Palpable  Carotid Palpable, No Bruit Palpable, No Bruit  Aorta Not palpable N/A  Femoral Palpable Palpable  Popliteal Not palpable Not palpable  PT Palpable Palpable  DP Palpable Palpable    Gastro- intestinal soft, non-distended, non-tender to palpation, No guarding or rebound, no HSM, no masses, no CVAT B, No palpable prominent aortic pulse,    Musculo- skeletal M/S 5/5 throughout  , Extremities without ischemic changes  , No edema present, No visible varicosities , No Lipodermatosclerosis present  Neurologic Cranial nerves 2-12 intact , Pain and light touch intact in extremities , Motor exam as listed above  Psychiatric Judgement intact, Mood & affect appropriate for pt's clinical situation  Dermatologic See M/S exam for extremity exam, No rashes otherwise noted  Lymphatic  Palpable lymph nodes: None    Radiology     CT Abd with contrast (06/28/17)  1.  No abscess.  No periappendiceal region inflammation.  2. Widespread colonic diverticulosis without diverticulitis. No bowel obstruction.  3.  No renal or ureteral calculus.  No hydronephrosis.  4.  Cholelithiasis.  5.  Hepatic steatosis.  6. Extensive aortoiliac atherosclerosis with probable hemodynamically significant obstruction in both common iliac arteries. Extensive calcification in the proximal major  mesenteric vessels. No changes of bowel ischemia are evident by CT. There also foci of coronary artery calcification.  7. Extensive multilevel arthropathy in the lumbar spine. Lumbar scoliosis evident.  I reviewed this patient CT.  She has extensive calcific atherosclerotic changes with a possible dissection in the distal aorta.  Celiac artery appears to have <50% stenosis.  SMA appears to have some stenosis also but the cuts are too coarse to evaluate such.  There continues to be obvious perfusion via iliac arterial systems.   Medical Decision Making   Laurie Harris is a 77 y.o. female who presents with: asymptomatic aortoiliac atherosclerosis, asx possible mesenteric stenosis   Despite the CT findings, this patient is asymptomatic, so no intervention is needed at this point.  We discussed the signs and sx of mesenteric ischemia and PAD.  If develops any of these, she will follow up as needed.  Thank you for allowing [name] to participate in this patient's care.   Adele Barthel, MD, FACS [facility] Office: [phone] Pager: [phone]  10/11/2017, 9:51 AM

## 2017-10-14 ENCOUNTER — Other Ambulatory Visit: Payer: Self-pay | Admitting: Family Medicine

## 2017-12-19 ENCOUNTER — Ambulatory Visit: Payer: Medicare Other | Admitting: Family Medicine

## 2017-12-26 ENCOUNTER — Other Ambulatory Visit: Payer: Self-pay

## 2017-12-26 ENCOUNTER — Encounter: Payer: Self-pay | Admitting: Family Medicine

## 2017-12-26 ENCOUNTER — Ambulatory Visit (INDEPENDENT_AMBULATORY_CARE_PROVIDER_SITE_OTHER): Payer: Medicare Other | Admitting: Family Medicine

## 2017-12-26 DIAGNOSIS — M810 Age-related osteoporosis without current pathological fracture: Secondary | ICD-10-CM | POA: Diagnosis not present

## 2017-12-26 DIAGNOSIS — I700 Atherosclerosis of aorta: Secondary | ICD-10-CM | POA: Diagnosis not present

## 2017-12-26 DIAGNOSIS — Z794 Long term (current) use of insulin: Secondary | ICD-10-CM

## 2017-12-26 DIAGNOSIS — I70299 Other atherosclerosis of native arteries of extremities, unspecified extremity: Secondary | ICD-10-CM

## 2017-12-26 DIAGNOSIS — I10 Essential (primary) hypertension: Secondary | ICD-10-CM | POA: Diagnosis not present

## 2017-12-26 DIAGNOSIS — I708 Atherosclerosis of other arteries: Principal | ICD-10-CM

## 2017-12-26 DIAGNOSIS — E118 Type 2 diabetes mellitus with unspecified complications: Secondary | ICD-10-CM

## 2017-12-26 DIAGNOSIS — E782 Mixed hyperlipidemia: Secondary | ICD-10-CM

## 2017-12-26 LAB — POCT GLYCOSYLATED HEMOGLOBIN (HGB A1C): HEMOGLOBIN A1C: 7.2

## 2017-12-26 NOTE — Assessment & Plan Note (Addendum)
BP remains adequately controlled.  Borderline low in the morning so more normal later in the day.  No lightheadedness.  She continues on benazepril.

## 2017-12-26 NOTE — Assessment & Plan Note (Signed)
She has seen vascular surgery.  No further follow-up needed.

## 2017-12-26 NOTE — Assessment & Plan Note (Signed)
She will take a holiday from the Fosamax given she has been on this greater than 5 years.  Will obtain a DEXA scan.

## 2017-12-26 NOTE — Patient Instructions (Signed)
Nice to see you. Please discontinue the Fosamax.  We will give you a year off of this. We will get you set up for a bone density scan. Please continue to monitor your blood pressures.

## 2017-12-26 NOTE — Progress Notes (Signed)
  Tommi Rumps, MD Phone: [phone]  Laurie Harris is a 77 y.o. female who presents today for f/u.  DIABETES Disease Monitoring: Blood Sugar ranges-123-222 Polyuria/phagia/dipsia- no      Optho- UTD Medications: Compliance- taking humalog 75/25 19 in am and 18 in pm Hypoglycemic symptoms- no  HYPERLIPIDEMIA Symptoms Chest pain on exertion:  no    Medications: Compliance- taking lipitor, fenofibrate, omega 3 Right upper quadrant pain- no  Muscle aches- no  Did see vascular surgery.  They recommended no further follow-up with them unless she develops issues.  Osteoporosis: Continues on Fosamax.  She has no issues swallowing this.  She has not broken any bones.  She is due for DEXA scan.  She reports it has been greater than 5 years that she has been on this medication.     Social History   Tobacco Use  Smoking Status Never Smoker  Smokeless Tobacco Never Used     ROS see history of present illness  Objective  Physical Exam Vitals:   12/26/17 0826  BP: 140/78  Pulse: 78  Temp: 98 F (36.7 C)  SpO2: 93%    BP Readings from Last 3 Encounters:  12/26/17 140/78  10/11/17 (!) 143/69  10/02/17 138/75   Wt Readings from Last 3 Encounters:  12/26/17 126 lb 9.6 oz (57.4 kg)  10/11/17 125 lb (56.7 kg)  10/02/17 124 lb (56.2 kg)    Physical Exam  Constitutional: No distress.  Cardiovascular: Normal rate, regular rhythm and normal heart sounds.  Pulmonary/Chest: Effort normal and breath sounds normal.  Musculoskeletal: She exhibits no edema.  Neurological: She is alert.  Skin: Skin is warm and dry. She is not diaphoretic.     Assessment/Plan: Please see individual problem list.  Aorto-iliac atherosclerosis ([place]) She has seen vascular surgery.  No further follow-up needed.  Benign essential HTN BP remains adequately controlled.  Borderline low in the morning so more normal later in the day.  No lightheadedness.  She continues on benazepril.  Diabetes  mellitus type 2, controlled, with complications W4R adequately controlled for age.  She will continue her current regimen.  Foot exam completed.  Hyperlipidemia She will continue Lipitor, fenofibrate, and omega-3's.  Osteoporosis She will take a holiday from the Fosamax given she has been on this greater than 5 years.  Will obtain a DEXA scan.  She has renal functioned checked with nephrology.   Orders Placed This Encounter  Procedures  . DG Bone Density    Standing Status:   Future    Standing Expiration Date:   02/26/2019    Order Specific Question:   Reason for Exam (SYMPTOM  OR DIAGNOSIS REQUIRED)    Answer:   osteoporosis follow-up    Order Specific Question:   Preferred imaging location?    Answer:   [facility]  . POCT HgB A1C    No orders of the defined types were placed in this encounter.    Tommi Rumps, MD [place]

## 2017-12-26 NOTE — Assessment & Plan Note (Signed)
She will continue Lipitor, fenofibrate, and omega-3's.

## 2017-12-26 NOTE — Assessment & Plan Note (Addendum)
A1c adequately controlled for age.  She will continue her current regimen.  Foot exam completed.

## 2018-01-01 DIAGNOSIS — E559 Vitamin D deficiency, unspecified: Secondary | ICD-10-CM | POA: Diagnosis not present

## 2018-01-01 DIAGNOSIS — E1122 Type 2 diabetes mellitus with diabetic chronic kidney disease: Secondary | ICD-10-CM | POA: Diagnosis not present

## 2018-01-01 DIAGNOSIS — I10 Essential (primary) hypertension: Secondary | ICD-10-CM | POA: Diagnosis not present

## 2018-01-01 DIAGNOSIS — N183 Chronic kidney disease, stage 3 (moderate): Secondary | ICD-10-CM | POA: Diagnosis not present

## 2018-01-30 ENCOUNTER — Ambulatory Visit: Payer: Medicare Other | Admitting: Oncology

## 2018-01-30 ENCOUNTER — Other Ambulatory Visit: Payer: Medicare Other

## 2018-02-03 ENCOUNTER — Other Ambulatory Visit: Payer: Self-pay | Admitting: Family Medicine

## 2018-02-07 ENCOUNTER — Inpatient Hospital Stay: Payer: Medicare Other | Attending: Oncology

## 2018-02-07 ENCOUNTER — Inpatient Hospital Stay (HOSPITAL_BASED_OUTPATIENT_CLINIC_OR_DEPARTMENT_OTHER): Payer: Medicare Other | Admitting: Oncology

## 2018-02-07 ENCOUNTER — Other Ambulatory Visit: Payer: Self-pay

## 2018-02-07 ENCOUNTER — Encounter: Payer: Self-pay | Admitting: Oncology

## 2018-02-07 VITALS — BP 167/72 | HR 67 | Temp 97.8°F | Resp 18 | Ht 66.0 in | Wt 126.4 lb

## 2018-02-07 DIAGNOSIS — E785 Hyperlipidemia, unspecified: Secondary | ICD-10-CM | POA: Diagnosis not present

## 2018-02-07 DIAGNOSIS — D7589 Other specified diseases of blood and blood-forming organs: Secondary | ICD-10-CM | POA: Insufficient documentation

## 2018-02-07 DIAGNOSIS — Z800 Family history of malignant neoplasm of digestive organs: Secondary | ICD-10-CM

## 2018-02-07 DIAGNOSIS — Z7982 Long term (current) use of aspirin: Secondary | ICD-10-CM | POA: Insufficient documentation

## 2018-02-07 DIAGNOSIS — N183 Chronic kidney disease, stage 3 (moderate): Secondary | ICD-10-CM | POA: Diagnosis not present

## 2018-02-07 DIAGNOSIS — Z79899 Other long term (current) drug therapy: Secondary | ICD-10-CM | POA: Insufficient documentation

## 2018-02-07 DIAGNOSIS — E119 Type 2 diabetes mellitus without complications: Secondary | ICD-10-CM | POA: Insufficient documentation

## 2018-02-07 DIAGNOSIS — Z794 Long term (current) use of insulin: Secondary | ICD-10-CM | POA: Insufficient documentation

## 2018-02-07 DIAGNOSIS — Z8673 Personal history of transient ischemic attack (TIA), and cerebral infarction without residual deficits: Secondary | ICD-10-CM | POA: Diagnosis not present

## 2018-02-07 DIAGNOSIS — Z801 Family history of malignant neoplasm of trachea, bronchus and lung: Secondary | ICD-10-CM | POA: Insufficient documentation

## 2018-02-07 DIAGNOSIS — I10 Essential (primary) hypertension: Secondary | ICD-10-CM

## 2018-02-07 DIAGNOSIS — I129 Hypertensive chronic kidney disease with stage 1 through stage 4 chronic kidney disease, or unspecified chronic kidney disease: Secondary | ICD-10-CM | POA: Insufficient documentation

## 2018-02-07 LAB — CBC WITH DIFFERENTIAL/PLATELET
Basophils Absolute: 0.1 10*3/uL (ref 0–0.1)
Basophils Relative: 1 %
Eosinophils Absolute: 0.2 10*3/uL (ref 0–0.7)
Eosinophils Relative: 3 %
HEMATOCRIT: 36.8 % (ref 35.0–47.0)
HEMOGLOBIN: 12.6 g/dL (ref 12.0–16.0)
LYMPHS ABS: 2.6 10*3/uL (ref 1.0–3.6)
LYMPHS PCT: 42 %
MCH: 33.6 pg (ref 26.0–34.0)
MCHC: 34.2 g/dL (ref 32.0–36.0)
MCV: 98.2 fL (ref 80.0–100.0)
MONO ABS: 0.5 10*3/uL (ref 0.2–0.9)
MONOS PCT: 8 %
NEUTROS ABS: 3 10*3/uL (ref 1.4–6.5)
NEUTROS PCT: 46 %
Platelets: 190 10*3/uL (ref 150–440)
RBC: 3.74 MIL/uL — ABNORMAL LOW (ref 3.80–5.20)
RDW: 14.1 % (ref 11.5–14.5)
WBC: 6.3 10*3/uL (ref 3.6–11.0)

## 2018-02-07 NOTE — Progress Notes (Signed)
No new changes noted today 

## 2018-02-10 NOTE — Progress Notes (Signed)
Hematology/Oncology Consult note [facility]  [phone] [phone]  Patient Care Team: [name], MD as PCP - General (Family Medicine) Dionisio David, MD (Cardiology)   Name of the patient: Laurie Harris  [id number]  1940-11-24   Date of visit: 02/10/18  Diagnosis- intermittent macrocytosis without anemia  Chief complaint/ Reason for visit- routine f/u of macrocytosis  Heme/Onc history: patient is a 77 year old female with past medical history significant for hypertension hyperlipidemia and diabetes. He has been referred to [name] for evaluation of macrocytosis. Most recent CBC from 06/26/2017 showed white count of 6, H&H of 12.3/36.6 with an MCV of 103 and a platelet count of 224. Earlier this year between January - May 2018 her MCV was between 98-99. Her hemoglobin has been stable between 12-13 since this year. She does not have any pre-existing cytopenia such as leukopenia or thrombocytopenia. TSH in January 2018 was normal at 0.9. B12 levels in October 2018 were normal at 519. Folate was normal  Patient is able toambulate around the house and is independent of her ADLs. She needs some assistance with IADLs. Reports some fatigue. Denies any fevers, chills, unintentional weight loss or loss of appetite  Results of blood work from 07/18/2017 were as follows: CBC showed white count of 8.6, H&H of 12.8/38 with an MCV of 98.6 and a platelet count of 198.  Reticulocyte count was normal at 1.2%.  TSH was normal at 1.27.  Multiple myeloma panel did not reveal any monoclonal protein.  Immunofixation was normal  Interval history- patient feels well. Denies any fatigue. Her appetite is good and weight has been stable  ECOG PS- 1 Pain scale- 0   Review of systems- Review of Systems  Constitutional: Negative for chills, fever, malaise/fatigue and weight loss.  HENT: Negative for congestion, ear discharge and nosebleeds.     Eyes: Negative for blurred vision.  Respiratory: Negative for cough, hemoptysis, sputum production, shortness of breath and wheezing.   Cardiovascular: Negative for chest pain, palpitations, orthopnea and claudication.  Gastrointestinal: Negative for abdominal pain, blood in stool, constipation, diarrhea, heartburn, melena, nausea and vomiting.  Genitourinary: Negative for dysuria, flank pain, frequency, hematuria and urgency.  Musculoskeletal: Negative for back pain, joint pain and myalgias.  Skin: Negative for rash.  Neurological: Negative for dizziness, tingling, focal weakness, seizures, weakness and headaches.  Endo/Heme/Allergies: Does not bruise/bleed easily.  Psychiatric/Behavioral: Negative for depression and suicidal ideas. The patient does not have insomnia.       Allergies  Allergen Reactions  . Warfarin And Related     Possible stroke     Past Medical History:  Diagnosis Date  . Carotid arterial disease ([place])    a. 09/2016 Carotid U/S: R carotid bifurcation dzs of 50-69%, L carotid bifurcation dzs of <50%. Patent vertebral arteries w/ anegrade flow.  . Chicken pox   . CKD (chronic kidney disease), stage III (HCC)    functioning at 46%  . Diabetes mellitus without complication ([place])   . Hemorrhagic stroke ([place]) 12/31/2014   a. in the setting of warfarin therapy.  . Hyperlipidemia   . Hypertension   . Syncope    a. 09/2016 Echo: EF 55-60%, no rwma, Gr1 DD.     Past Surgical History:  Procedure Laterality Date  . ABCESS DRAINAGE  06/14/2016   appendix  . APPENDECTOMY  06/13/2016  . CATARACT EXTRACTION W/PHACO Left 09/04/2017   Procedure: CATARACT EXTRACTION PHACO AND INTRAOCULAR LENS PLACEMENT (I[facility]) COMPLICATED LEFT DIABETIC;  Surgeon: Leandrew Koyanagi, MD;  Location: [facility];  Service: Ophthalmology;  Laterality: Left;  Diabetic - insulin  . CATARACT EXTRACTION W/PHACO Right 10/02/2017   Procedure: CATARACT EXTRACTION PHACO AND INTRAOCULAR LENS  PLACEMENT ([place]) COMPLICATED  RIGHT DIABETIC;  Surgeon: Leandrew Koyanagi, MD;  Location: [facility];  Service: Ophthalmology;  Laterality: Right;  Diabetic - insulin  . COLONOSCOPY    . NECK SURGERY      Social History   Socioeconomic History  . Marital status: Married    Spouse name: Not on file  . Number of children: Not on file  . Years of education: Not on file  . Highest education level: Not on file  Occupational History  . Not on file  Social Needs  . Financial resource strain: Not on file  . Food insecurity:    Worry: Not on file    Inability: Not on file  . Transportation needs:    Medical: Not on file    Non-medical: Not on file  Tobacco Use  . Smoking status: Never Smoker  . Smokeless tobacco: Never Used  Substance and Sexual Activity  . Alcohol use: No    Alcohol/week: 0.0 oz  . Drug use: No  . Sexual activity: Not on file  Lifestyle  . Physical activity:    Days per week: Not on file    Minutes per session: Not on file  . Stress: Not on file  Relationships  . Social connections:    Talks on phone: Not on file    Gets together: Not on file    Attends religious service: Not on file    Active member of club or organization: Not on file    Attends meetings of clubs or organizations: Not on file    Relationship status: Not on file  . Intimate partner violence:    Fear of current or ex partner: Not on file    Emotionally abused: Not on file    Physically abused: Not on file    Forced sexual activity: Not on file  Other Topics Concern  . Not on file  Social History Narrative  . Not on file    Family History  Problem Relation Age of Onset  . Diabetes Mother   . Dementia Mother   . Heart disease Mother   . Cancer Mother        lung  . Cancer Father        colon  . Stroke Daughter   . Hyperlipidemia Daughter   . Breast cancer Neg Hx      Current Outpatient Medications:  .  aspirin EC 81 MG tablet, Take 81 mg by mouth daily., Disp: ,  Rfl:  .  atorvastatin (LIPITOR) 80 MG tablet, TAKE ONE TABLET BY MOUTH EVERY DAY, Disp: 90 tablet, Rfl: 2 .  benazepril (LOTENSIN) 5 MG tablet, Take 2.'5mg'$  (half a tablet) by mouth at night., Disp: 45 tablet, Rfl: 2 .  Calcium Carbonate (CALCIUM 600 PO), Take by mouth., Disp: , Rfl:  .  calcium-vitamin D (OSCAL WITH D) 500-200 MG-UNIT tablet, Take 1 tablet by mouth 2 (two) times daily., Disp: , Rfl:  .  cholecalciferol (VITAMIN D) 1000 units tablet, Take 2,000 Units by mouth daily., Disp: , Rfl:  .  fenofibrate 160 MG tablet, TAKE ONE TABLET BY MOUTH EVERY DAY, Disp: 90 tablet, Rfl: 1 .  Multiple Vitamin (MULTIVITAMIN WITH MINERALS) TABS tablet, Take 1 tablet by mouth daily., Disp: , Rfl:  .  Omega-3  Fatty Acids (FISH OIL) 1200 MG CAPS, Take 1,200 mg by mouth daily., Disp: , Rfl:  .  pantoprazole (PROTONIX) 40 MG tablet, TAKE 1 TABLET BY MOUTH DAILY, Disp: 90 tablet, Rfl: 2 .  alendronate (FOSAMAX) 70 MG tablet, TAKE 1 TABLET EVERY 7 DAYS WITH A FULL GLASS OF WATER ON AN EMPTY STOMACH DO NOT LIE DOWN FOR AT LEAST 30 MIN (Patient not taking: Reported on 02/07/2018), Disp: 4 tablet, Rfl: 3 .  Insulin Lispro Prot & Lispro (HUMALOG MIX 75/25 KWIKPEN) (75-25) 100 UNIT/ML Kwikpen, INJECT 17 UNITS SQ TWICE A DAY AS DIRECTED (Patient not taking: Reported on 02/07/2018), Disp: 15 mL, Rfl: 2 .  Insulin Pen Needle (NOVOFINE PLUS) 32G X 4 MM MISC, USE TWICE DAILY AS DIRECTED (Patient not taking: Reported on 02/07/2018), Disp: 100 each, Rfl: 6  Physical exam:  Vitals:   02/07/18 1526  BP: (!) 167/72  Pulse: 67  Resp: 18  Temp: 97.8 F (36.6 C)  TempSrc: Tympanic  SpO2: 97%  Weight: 126 lb 6.4 oz (57.3 kg)  Height: _0  (1.676 m)   Physical Exam  Constitutional: She is oriented to person, place, and time. She appears well-developed and well-nourished.  HENT:  Head: Normocephalic and atraumatic.  Eyes: Pupils are equal, round, and reactive to light. EOM are normal.  Neck: Normal range of motion.    Cardiovascular: Normal rate, regular rhythm and normal heart sounds.  Pulmonary/Chest: Effort normal and breath sounds normal.  Abdominal: Soft. Bowel sounds are normal.  Neurological: She is alert and oriented to person, place, and time.  Skin: Skin is warm and dry.     CMP Latest Ref Rng & Units 09/25/2017  Glucose 70 - 99 mg/dL -  BUN 6 - 23 mg/dL -  Creatinine 0.44 - 1.00 mg/dL -  Sodium 135 - 145 mEq/L -  Potassium 3.5 - 5.1 mEq/L -  Chloride 96 - 112 mEq/L -  CO2 19 - 32 mEq/L -  Calcium 8.4 - 10.5 mg/dL -  Total Protein 6.0 - 8.3 g/dL 7.3  Total Bilirubin 0.2 - 1.2 mg/dL 0.6  Alkaline Phos 39 - 117 U/L 64  AST 0 - 37 U/L 24  ALT 0 - 35 U/L 24   CBC Latest Ref Rng & Units 02/07/2018  WBC 3.6 - 11.0 K/uL 6.3  Hemoglobin 12.0 - 16.0 g/dL 12.6  Hematocrit 35.0 - 47.0 % 36.8  Platelets 150 - 440 K/uL 190      Assessment and plan- Patient is a 77 y.o. female with intermittent macrocytosis without anemia  Work up for macrocytosis as above was unremarkable. She has had a normal hb for over a year now. No other cytopenias. She had some macrocytosis in oct 2018 which has subsequently resolved. She can continue to f/u with Dr. Caryl Bis at this time. No hematology required at this time  Visit Diagnosis 1. Macrocytosis without anemia      Dr. Randa Evens, MD, MPH [facility] at [facility] [phone] 02/10/2018 8:59 AM

## 2018-02-24 ENCOUNTER — Other Ambulatory Visit: Payer: Self-pay | Admitting: Family Medicine

## 2018-02-25 ENCOUNTER — Other Ambulatory Visit: Payer: Self-pay | Admitting: Family Medicine

## 2018-02-25 ENCOUNTER — Ambulatory Visit
Admission: RE | Admit: 2018-02-25 | Discharge: 2018-02-25 | Disposition: A | Discharge status: Home / Self Care | Payer: Medicare Other | Source: Ambulatory Visit | Attending: Family Medicine | Admitting: Family Medicine

## 2018-02-25 DIAGNOSIS — Z780 Asymptomatic menopausal state: Secondary | ICD-10-CM | POA: Diagnosis not present

## 2018-02-25 DIAGNOSIS — M810 Age-related osteoporosis without current pathological fracture: Secondary | ICD-10-CM | POA: Insufficient documentation

## 2018-02-25 DIAGNOSIS — Z1231 Encounter for screening mammogram for malignant neoplasm of breast: Secondary | ICD-10-CM

## 2018-02-25 DIAGNOSIS — M85852 Other specified disorders of bone density and structure, left thigh: Secondary | ICD-10-CM | POA: Diagnosis not present

## 2018-03-19 ENCOUNTER — Other Ambulatory Visit: Payer: Self-pay | Admitting: Family Medicine

## 2018-03-22 ENCOUNTER — Other Ambulatory Visit: Payer: Self-pay | Admitting: Family Medicine

## 2018-03-24 ENCOUNTER — Ambulatory Visit
Admission: RE | Admit: 2018-03-24 | Discharge: 2018-03-24 | Disposition: A | Discharge status: Home / Self Care | Payer: Medicare Other | Source: Ambulatory Visit | Attending: Family Medicine | Admitting: Family Medicine

## 2018-03-24 DIAGNOSIS — Z1231 Encounter for screening mammogram for malignant neoplasm of breast: Secondary | ICD-10-CM | POA: Diagnosis not present

## 2018-04-04 ENCOUNTER — Ambulatory Visit (INDEPENDENT_AMBULATORY_CARE_PROVIDER_SITE_OTHER): Payer: Medicare Other | Admitting: Family Medicine

## 2018-04-04 ENCOUNTER — Encounter: Payer: Self-pay | Admitting: Family Medicine

## 2018-04-04 VITALS — BP 140/80 | HR 78 | Temp 97.7°F | Ht 66.0 in | Wt 126.2 lb

## 2018-04-04 DIAGNOSIS — I70299 Other atherosclerosis of native arteries of extremities, unspecified extremity: Secondary | ICD-10-CM | POA: Diagnosis not present

## 2018-04-04 DIAGNOSIS — I10 Essential (primary) hypertension: Secondary | ICD-10-CM | POA: Diagnosis not present

## 2018-04-04 DIAGNOSIS — I700 Atherosclerosis of aorta: Secondary | ICD-10-CM | POA: Diagnosis not present

## 2018-04-04 DIAGNOSIS — E118 Type 2 diabetes mellitus with unspecified complications: Secondary | ICD-10-CM | POA: Diagnosis not present

## 2018-04-04 DIAGNOSIS — E782 Mixed hyperlipidemia: Secondary | ICD-10-CM

## 2018-04-04 DIAGNOSIS — Z794 Long term (current) use of insulin: Secondary | ICD-10-CM

## 2018-04-04 LAB — BASIC METABOLIC PANEL
BUN: 32 mg/dL — AB (ref 6–23)
CO2: 26 mEq/L (ref 19–32)
CREATININE: 1.25 mg/dL — AB (ref 0.40–1.20)
Calcium: 10.3 mg/dL (ref 8.4–10.5)
Chloride: 108 mEq/L (ref 96–112)
GFR: 43.89 mL/min — ABNORMAL LOW (ref >60.00)
Glucose, Bld: 102 mg/dL — ABNORMAL HIGH (ref 70–99)
POTASSIUM: 4.4 meq/L (ref 3.5–5.1)
Sodium: 141 mEq/L (ref 135–145)

## 2018-04-04 LAB — HEMOGLOBIN A1C: Hgb A1c MFr Bld: 7.5 % — ABNORMAL HIGH (ref 4.6–6.5)

## 2018-04-04 NOTE — Patient Instructions (Signed)
Nice to see you. We will check lab work today and contact you with the results. 

## 2018-04-04 NOTE — Assessment & Plan Note (Signed)
Low diastolics in the morning.  We will check her kidney function today and consider discontinuing her benazepril.

## 2018-04-04 NOTE — Progress Notes (Signed)
  Tommi Rumps, MD Phone: [phone]  Laurie Harris is a 77 y.o. female who presents today for f/u.  CC: dm, htn, hld  HYPERTENSION Disease Monitoring: Blood pressure range-95-120/47-60 in am, 120-144/57-73 in pm Chest pain- no      Dyspnea- no Medications: Compliance- taking benazepril Lightheadedness- no   Edema- no  DIABETES Disease Monitoring: Blood Sugar ranges-130-202 fasting, 118-242 in pm  Polyuria/phagia/dipsia- no      Optho- UTD Medications: Compliance- taking humalog 75/25 19 u in am and 18 u in pm Hypoglycemic symptoms- no  HYPERLIPIDEMIA Disease Monitoring: See symptoms for Hypertension Medications: Compliance- taking lipitor Right upper quadrant pain- no  Muscle aches- no     Social History   Tobacco Use  Smoking Status Never Smoker  Smokeless Tobacco Never Used     ROS see history of present illness  Objective  Physical Exam Vitals:   04/04/18 0921  BP: 140/80  Pulse: 78  Temp: 97.7 F (36.5 C)  SpO2: 98%    BP Readings from Last 3 Encounters:  04/04/18 140/80  02/07/18 (!) 167/72  12/26/17 140/78   Wt Readings from Last 3 Encounters:  04/04/18 126 lb 3.2 oz (57.2 kg)  02/07/18 126 lb 6.4 oz (57.3 kg)  12/26/17 126 lb 9.6 oz (57.4 kg)    Physical Exam  Constitutional: No distress.  Cardiovascular: Normal rate, regular rhythm and normal heart sounds.  Pulmonary/Chest: Effort normal and breath sounds normal.  Musculoskeletal: She exhibits no edema.  Neurological: She is alert.  Skin: Skin is warm and dry. She is not diaphoretic.     Assessment/Plan: Please see individual problem list.  Benign essential HTN Low diastolics in the morning.  We will check her kidney function today and consider discontinuing her benazepril.  Diabetes mellitus type 2, controlled, with complications Check H6P.  Continue current regimen.  Hyperlipidemia Continue Lipitor.   Orders Placed This Encounter  Procedures  . HgB A1c  . Basic  Metabolic Panel (BMET)    No orders of the defined types were placed in this encounter.    Tommi Rumps, MD [place]

## 2018-04-04 NOTE — Assessment & Plan Note (Signed)
Check A1c.  Continue current regimen. 

## 2018-04-04 NOTE — Assessment & Plan Note (Signed)
-  Continue Lipitor °

## 2018-04-15 ENCOUNTER — Other Ambulatory Visit: Payer: Self-pay | Admitting: Family Medicine

## 2018-04-16 ENCOUNTER — Ambulatory Visit: Payer: Medicare Other

## 2018-04-17 ENCOUNTER — Ambulatory Visit (INDEPENDENT_AMBULATORY_CARE_PROVIDER_SITE_OTHER): Payer: Medicare Other

## 2018-04-17 DIAGNOSIS — I10 Essential (primary) hypertension: Secondary | ICD-10-CM

## 2018-04-17 NOTE — Progress Notes (Signed)
Patient came in for BP check.  She was taken off benzopril 2.5mg  2AUG2019 due to dropping her diastolic 40 or below.  First BP today was 172/80, BPM was 94.  Ten Minutes later BP was 156/70, BPM was 85.  Informed provider.  Patient has no complaints of headaches, blurry vision, chest pain, arm pain, light headedness, dizziness, and nor jaw pain.  Provider stated patient would need a follow up with PCP. Appointment has been made for 27AUG2019 @ 1500.  Patient was also informed that if systolic became 287 or above to please call office.  She had no questions, comments, and or concerns.

## 2018-04-17 NOTE — Progress Notes (Signed)
I am aware of BP results. Repeat BP was better than original reading. She will continue current medications and come in for office visit in 2 weeks for follow up.

## 2018-04-29 ENCOUNTER — Ambulatory Visit (INDEPENDENT_AMBULATORY_CARE_PROVIDER_SITE_OTHER): Payer: Medicare Other | Admitting: Family Medicine

## 2018-04-29 ENCOUNTER — Encounter: Payer: Self-pay | Admitting: Family Medicine

## 2018-04-29 VITALS — BP 150/78 | HR 89 | Temp 98.3°F | Wt 126.0 lb

## 2018-04-29 DIAGNOSIS — I70299 Other atherosclerosis of native arteries of extremities, unspecified extremity: Secondary | ICD-10-CM | POA: Diagnosis not present

## 2018-04-29 DIAGNOSIS — N281 Cyst of kidney, acquired: Secondary | ICD-10-CM | POA: Diagnosis not present

## 2018-04-29 DIAGNOSIS — I10 Essential (primary) hypertension: Secondary | ICD-10-CM | POA: Diagnosis not present

## 2018-04-29 DIAGNOSIS — I700 Atherosclerosis of aorta: Secondary | ICD-10-CM

## 2018-04-29 DIAGNOSIS — H1132 Conjunctival hemorrhage, left eye: Secondary | ICD-10-CM | POA: Diagnosis not present

## 2018-04-29 DIAGNOSIS — I6523 Occlusion and stenosis of bilateral carotid arteries: Secondary | ICD-10-CM | POA: Diagnosis not present

## 2018-04-29 MED ORDER — BENAZEPRIL HCL 5 MG PO TABS: ORAL_TABLET | ORAL | 2 refills | Status: DC

## 2018-04-29 NOTE — Assessment & Plan Note (Signed)
Mild bilateral carotid bruits noted on exam.  She had follow-up carotid ultrasound in January 2019.  She sees her cardiologist in 2 days for follow-up on this.

## 2018-04-29 NOTE — Assessment & Plan Note (Signed)
BP has been above goal at times.  We will add back her benazepril and she will monitor her blood pressure.  Lab work in 1 week.  BP check in 1 week.

## 2018-04-29 NOTE — Progress Notes (Signed)
Tommi Rumps, MD Phone: [phone]  Laurie Harris is a 77 y.o. female who presents today for f/u.  CC: htn, subconjunctival hemorrhage, carotid artery stenosis  Hypertension: Checking blood pressures at home after coming off of benazepril due to low diastolics.  She has been in the upper 120s-167 over upper 60s-low 80s.  She notes no chest pain, shortness of breath, or edema.  She reports waking up with a subconjunctival hemorrhage of the left inner eye earlier this morning.  Notes it is already starting to improve.  She had no injury.  No vision changes.  No other symptoms with this.  Patient follows with her cardiologist for carotid artery stenosis.  She has known bruits.  She had follow-up ultrasound previously.  She sees her cardiologist on Thursday.  She reports chronic balance issues since she had her hemorrhagic stroke.  This is unchanged from then.  Social History   Tobacco Use  Smoking Status Never Smoker  Smokeless Tobacco Never Used     ROS see history of present illness  Objective  Physical Exam Vitals:   04/29/18 1510  BP: (!) 150/78  Pulse: 89  Temp: 98.3 F (36.8 C)  SpO2: 98%    BP Readings from Last 3 Encounters:  04/29/18 (!) 150/78  04/04/18 140/80  02/07/18 (!) 167/72   Wt Readings from Last 3 Encounters:  04/29/18 126 lb (57.2 kg)  04/04/18 126 lb 3.2 oz (57.2 kg)  02/07/18 126 lb 6.4 oz (57.3 kg)    Physical Exam  Constitutional: No distress.  Eyes:  Small subconjunctival hemorrhage noted on the medial portion of her left eye, does not involve the cornea, no gross corneal changes, pupils equal and reactive to light, otherwise conjunctive appear normal  Cardiovascular: Normal rate, regular rhythm and normal heart sounds.  Slight bilateral carotid bruits  Pulmonary/Chest: Effort normal and breath sounds normal.  Musculoskeletal: She exhibits no edema.  Neurological: She is alert.  Skin: Skin is warm and dry. She is not diaphoretic.       Assessment/Plan: Please see individual problem list.  Benign essential HTN BP has been above goal at times.  We will add back her benazepril and she will monitor her blood pressure.  Lab work in 1 week.  BP check in 1 week.  Carotid artery disease (HCC) Mild bilateral carotid bruits noted on exam.  She had follow-up carotid ultrasound in January 2019.  She sees her cardiologist in 2 days for follow-up on this.  Subconjunctival hemorrhage of left eye Already improving per patient report.  No other symptoms.  No vision changes.  Monitor for improvement.  Return precautions in AVS.  Renal cyst Follow-up ultrasound ordered.  I will have CMA contact the patient to let her know that this has been ordered.    Orders Placed This Encounter  Procedures  . US Renal    Please schedule for october    Standing Status:   Future    Standing Expiration Date:   06/30/2019    Order Specific Question:   Reason for Exam (SYMPTOM  OR DIAGNOSIS REQUIRED)    Answer:   follow-up on prior cyst    Order Specific Question:   Preferred imaging location?    Answer:   [facility]  . Basic Metabolic Panel (BMET)    Standing Status:   Future    Standing Expiration Date:   04/30/2019    Meds ordered this encounter  Medications  . benazepril (LOTENSIN) 5 MG tablet  Sig: Take 2.5mg  (half a tablet) by mouth at night.    Dispense:  45 tablet    Refill:  2     Tommi Rumps, MD [place]

## 2018-04-29 NOTE — Assessment & Plan Note (Signed)
Already improving per patient report.  No other symptoms.  No vision changes.  Monitor for improvement.  Return precautions in AVS.

## 2018-04-29 NOTE — Assessment & Plan Note (Addendum)
Follow-up ultrasound ordered.  I will have CMA contact the patient to let her know that this has been ordered.

## 2018-04-29 NOTE — Patient Instructions (Signed)
Nice to see you. We will have you restart your benazepril.  Please monitor your blood pressure.  We will have you return in 1 week for labs and a BP check. Please monitor the subconjunctival hemorrhage in her left eye.  If it worsens or you develop vision changes please seek medical attention. We will get you set up for an ultrasound of your carotid arteries.

## 2018-04-30 NOTE — Progress Notes (Signed)
Patient notified

## 2018-05-01 ENCOUNTER — Telehealth: Payer: Self-pay

## 2018-05-01 ENCOUNTER — Ambulatory Visit (INDEPENDENT_AMBULATORY_CARE_PROVIDER_SITE_OTHER): Payer: Medicare Other | Admitting: Cardiovascular Disease

## 2018-05-01 ENCOUNTER — Encounter: Payer: Self-pay | Admitting: Cardiovascular Disease

## 2018-05-01 VITALS — BP 140/60 | HR 75 | Ht 66.0 in | Wt 128.0 lb

## 2018-05-01 DIAGNOSIS — E785 Hyperlipidemia, unspecified: Secondary | ICD-10-CM | POA: Diagnosis not present

## 2018-05-01 DIAGNOSIS — I700 Atherosclerosis of aorta: Secondary | ICD-10-CM | POA: Diagnosis not present

## 2018-05-01 DIAGNOSIS — I779 Disorder of arteries and arterioles, unspecified: Secondary | ICD-10-CM

## 2018-05-01 DIAGNOSIS — I10 Essential (primary) hypertension: Secondary | ICD-10-CM | POA: Diagnosis not present

## 2018-05-01 DIAGNOSIS — I70299 Other atherosclerosis of native arteries of extremities, unspecified extremity: Secondary | ICD-10-CM | POA: Diagnosis not present

## 2018-05-01 DIAGNOSIS — I739 Peripheral vascular disease, unspecified: Principal | ICD-10-CM

## 2018-05-01 NOTE — Patient Instructions (Addendum)
Medication Instructions: Your physician recommends that you continue on your current medications as directed. Please refer to the Current Medication list given to you today.  If you need a refill on your cardiac medications before your next appointment, please call your pharmacy.   Procedure: Your physician has requested that you have a carotid duplex in January. This test is an ultrasound of the carotid arteries in your neck. It looks at blood flow through these arteries that supply the brain with blood. Allow one hour for this exam. There are no restrictions or special instructions. This will take place at [id number], the [facility] office.  Follow-Up: Your physician wants you to follow-up in 12 months with Dr. Fletcher Anon. You will receive a reminder letter in the mail two months in advance. If you don't receive a letter, please call our office at [phone] to schedule this follow-up appointment.  Thank you for choosing [facility] at [facility]!

## 2018-05-01 NOTE — Progress Notes (Signed)
Cardiology Office Note   Date:  05/01/2018   ID:  Laurie, Harris 07-Nov-1940, MRN [id number]  PCP:  [name], MD  Cardiologist:   Kathlyn Sacramento, MD   Chief Complaint  Patient presents with  . other    12 month follow up. Meds reviewed by the pt. verbally. "doing well."       History of Present Illness: Laurie Harris is a 77 y.o. female who Is here today for a follow-up visit regarding previous syncope, chest pain and moderate bilateral carotid stenosis. She has known history of essential hypertension, hyperlipidemia, intracranial hemorrhage while on warfarin, chronic kidney disease, diabetes mellitus and carotid artery disease. She had a prior syncopal episode which was felt to be vasovagal with a background of volume depletion. Carotid Doppler showed moderate bilateral carotid disease worse on the right side at 50-69%. Echocardiogram showed normal LV systolic function with grade 1 diastolic dysfunction which was normal.   She has been doing well with no recent chest pain, shortness of breath or palpitations.    Past Medical History:  Diagnosis Date  . Carotid arterial disease ([place])    a. 09/2016 Carotid U/S: R carotid bifurcation dzs of 50-69%, L carotid bifurcation dzs of <50%. Patent vertebral arteries w/ anegrade flow.  . Chicken pox   . CKD (chronic kidney disease), stage III (HCC)    functioning at 46%  . Diabetes mellitus without complication ([place])   . Hemorrhagic stroke ([place]) 12/31/2014   a. in the setting of warfarin therapy.  . Hyperlipidemia   . Hypertension   . Syncope    a. 09/2016 Echo: EF 55-60%, no rwma, Gr1 DD.    Past Surgical History:  Procedure Laterality Date  . ABCESS DRAINAGE  06/14/2016   appendix  . APPENDECTOMY  06/13/2016  . CATARACT EXTRACTION W/PHACO Left 09/04/2017   Procedure: CATARACT EXTRACTION PHACO AND INTRAOCULAR LENS PLACEMENT ([place]) COMPLICATED LEFT DIABETIC;  Surgeon: Leandrew Koyanagi, MD;  Location: [facility];  Service: Ophthalmology;  Laterality: Left;  Diabetic - insulin  . CATARACT EXTRACTION W/PHACO Right 10/02/2017   Procedure: CATARACT EXTRACTION PHACO AND INTRAOCULAR LENS PLACEMENT ([place]) COMPLICATED  RIGHT DIABETIC;  Surgeon: Leandrew Koyanagi, MD;  Location: [facility];  Service: Ophthalmology;  Laterality: Right;  Diabetic - insulin  . COLONOSCOPY    . NECK SURGERY       Current Outpatient Medications  Medication Sig Dispense Refill  . aspirin EC 81 MG tablet Take 81 mg by mouth daily.    Marland Kitchen atorvastatin (LIPITOR) 80 MG tablet TAKE 1 TABLET BY MOUTH DAILY 90 tablet 2  . benazepril (LOTENSIN) 5 MG tablet Take 2.5mg  (half a tablet) by mouth at night. 45 tablet 2  . Calcium Carbonate (CALCIUM 600 PO) Take by mouth.    . calcium-vitamin D (OSCAL WITH D) 500-200 MG-UNIT tablet Take 1 tablet by mouth 2 (two) times daily.    . cholecalciferol (VITAMIN D) 1000 units tablet Take 2,000 Units by mouth daily.    . fenofibrate 160 MG tablet TAKE 1 TABLET BY MOUTH DAILY 90 tablet 1  . insulin lispro protamine-lispro (HUMALOG 75/25 MIX) (75-25) 100 UNIT/ML SUSP injection Inject into the skin. Inject 19 units in the morning and 18 at night    . Multiple Vitamin (MULTIVITAMIN WITH MINERALS) TABS tablet Take 1 tablet by mouth daily.    Marland Kitchen NOVOFINE PLUS 32G X 4 MM MISC USE TWICE A DAY AS DIRECTED 100 each 6  . Omega-3  Fatty Acids (FISH OIL) 1200 MG CAPS Take 1,200 mg by mouth daily.    . pantoprazole (PROTONIX) 40 MG tablet TAKE ONE TABLET BY MOUTH EVERY DAY 90 tablet 2   No current facility-administered medications for this visit.     Allergies:   Warfarin and related    Social History:  The patient  reports that she has never smoked. She has never used smokeless tobacco. She reports that she does not drink alcohol or use drugs.   Family History:  The patient's family history includes Cancer in her father and mother; Dementia in her mother; Diabetes in her mother; Heart disease  in her mother; Hyperlipidemia in her daughter; Stroke in her daughter.    ROS:  Please see the history of present illness.   Otherwise, review of systems are positive for none.   All other systems are reviewed and negative.    PHYSICAL EXAM: VS:  BP 140/60 (BP Location: Left Arm, Patient Position: Sitting, Cuff Size: Normal)   Pulse 75   Ht 5\' 6"  (1.676 m)   Wt 128 lb (58.1 kg)   BMI 20.66 kg/m  , BMI Body mass index is 20.66 kg/m. GEN: Well nourished, well developed, in no acute distress  HEENT: normal  Neck: no JVD, or masses. Bilateral carotid bruits  Cardiac: RRR; no murmurs, rubs, or gallops,no edema  Respiratory:  clear to auscultation bilaterally, normal work of breathing GI: soft, nontender, nondistended, + BS MS: no deformity or atrophy  Skin: warm and dry, no rash Neuro:  Strength and sensation are intact Psych: euthymic mood, full affect   EKG:  EKG is ordered today. The ekg ordered today demonstrates normal sinus rhythm with no significant ST or T wave changes.   Recent Labs: 07/18/2017: TSH 1.272 09/25/2017: ALT 24 02/07/2018: Hemoglobin 12.6; Platelets 190 04/04/2018: BUN 32; Creatinine, Ser 1.25; Potassium 4.4; Sodium 141    Lipid Panel    Component Value Date/Time   CHOL 140 09/25/2017 0748   TRIG 191.0 (H) 09/25/2017 0748   HDL 34.20 (L) 09/25/2017 0748   CHOLHDL 4 09/25/2017 0748   VLDL 38.2 09/25/2017 0748   LDLCALC 67 09/25/2017 0748      Wt Readings from Last 3 Encounters:  05/01/18 128 lb (58.1 kg)  04/29/18 126 lb (57.2 kg)  04/04/18 126 lb 3.2 oz (57.2 kg)       PAD Screen 10/15/2016  Previous PAD dx? No  Previous surgical procedure? No  Pain with walking? No  Feet/toe relief with dangling? No  Painful, non-healing ulcers? No  Extremities discolored? No      ASSESSMENT AND PLAN:  1. Bilateral carotid disease: Most recent carotid Doppler in January 2019 showed progression of right carotid stenosis to 60 to 79% with less than 40%  stenosis on the left side.  Continue low-dose aspirin and treatment of hyperlipidemia.  Repeat study in January 2020.  2. Hyperlipidemia: Currently on high dose atorvastatin and fenofibrate.  Most recent lipid profile showed an LDL of 67 with a triglyceride of 191.  3. Essential hypertension: Blood pressure is reasonably controlled.  Continue enalapril.  Disposition:   FU with me in 1 year  Signed,  Kathlyn Sacramento, MD  05/01/2018 10:10 AM    [place]

## 2018-05-01 NOTE — Telephone Encounter (Signed)
Copied from [place] [id number]. Topic: General - Other >> May 01, 2018  9:47 AM Judyann Munson wrote: Reason for CRM: Patient is requesting a call back from [place]  she stated she will be available between 11:30- 12. Please advise

## 2018-05-07 ENCOUNTER — Other Ambulatory Visit: Payer: Medicare Other

## 2018-05-07 ENCOUNTER — Ambulatory Visit (INDEPENDENT_AMBULATORY_CARE_PROVIDER_SITE_OTHER): Payer: Medicare Other | Admitting: *Deleted

## 2018-05-07 VITALS — BP 136/78 | HR 70 | Resp 16

## 2018-05-07 DIAGNOSIS — E559 Vitamin D deficiency, unspecified: Secondary | ICD-10-CM | POA: Diagnosis not present

## 2018-05-07 DIAGNOSIS — E1122 Type 2 diabetes mellitus with diabetic chronic kidney disease: Secondary | ICD-10-CM | POA: Diagnosis not present

## 2018-05-07 DIAGNOSIS — N183 Chronic kidney disease, stage 3 (moderate): Secondary | ICD-10-CM | POA: Diagnosis not present

## 2018-05-07 DIAGNOSIS — I10 Essential (primary) hypertension: Secondary | ICD-10-CM

## 2018-05-07 NOTE — Progress Notes (Signed)
Patient here for nurse visit BP check per order from one 1 week recheck after adding back Benazepril.   Patient reports compliance with prescribed BP medications: yes  Last dose of BP medication:  This morning.  BP Readings from Last 3 Encounters:  05/01/18 140/60  04/29/18 (!) 150/78  04/04/18 140/80   Pulse Readings from Last 3 Encounters:  05/01/18 75  04/29/18 89  04/04/18 78    Patient BP taken in left arm 132/74 pulse 73 patient given few minutes to rest and BP taken in right arm 136/78 pulse 70.

## 2018-05-07 NOTE — Addendum Note (Signed)
Addended by: Arby Barrette on: 05/07/2018 02:34 PM   Modules accepted: Orders

## 2018-05-08 NOTE — Progress Notes (Signed)
Blood pressure is acceptable.  Please see if we were able to track down her lab results from her nephrologist.  Thanks.

## 2018-05-09 NOTE — Progress Notes (Signed)
Dr. Holley Raring is her nephrologist office closed early on Friday will call for labs on Monday . Patient ware of BP and top continue current regimen.

## 2018-05-19 DIAGNOSIS — E119 Type 2 diabetes mellitus without complications: Secondary | ICD-10-CM | POA: Diagnosis not present

## 2018-05-19 LAB — HM DIABETES EYE EXAM

## 2018-05-22 IMAGING — CT CT ABD-PELV W/ CM
1 of 3 series · 13 of 32 positions shown, 19 images · IV contrast (APPLIED)
Comparison: 06/14/2016 and 06/13/2016 CTs

CLINICAL DATA: 75-year-old female -followup ruptured appendicitis
and peritoneal abscess with drainage on 06/13/2016.

EXAM:
CT ABDOMEN AND PELVIS WITH CONTRAST
TECHNIQUE: Multidetector CT imaging of the abdomen and pelvis was performed
using the standard protocol following bolus administration of
intravenous contrast.
CONTRAST:  75 cc intravenous Qsovue-WJJ

[Series 2: axial st · axial · 0.71mm/px · z∈[-864,-509]mm · 13 of 83 slices shown, 19 images]
[im 6/83  soft-tissue]
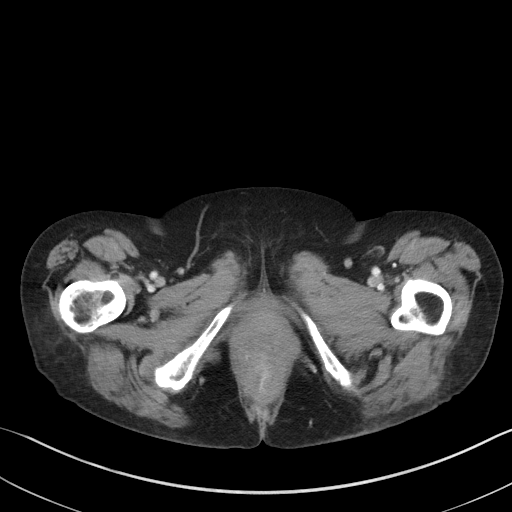
[im 6/83  bone]
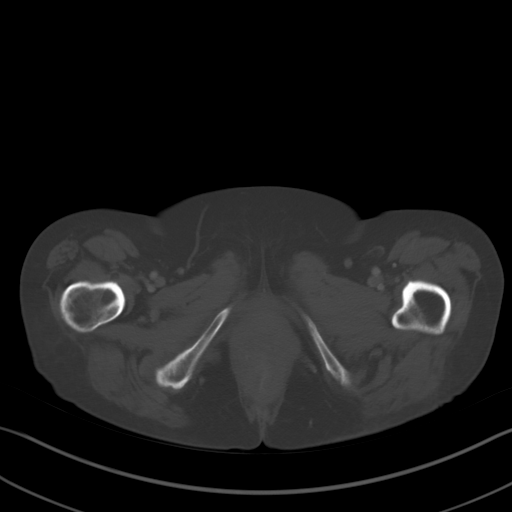
[im 12/83  soft-tissue]
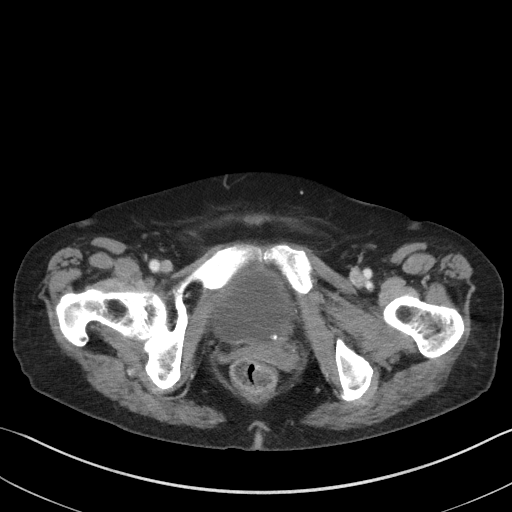
[im 18/83  soft-tissue]
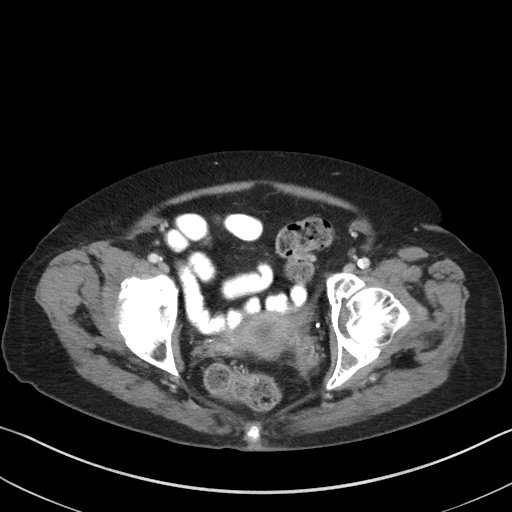
[im 24/83  soft-tissue]
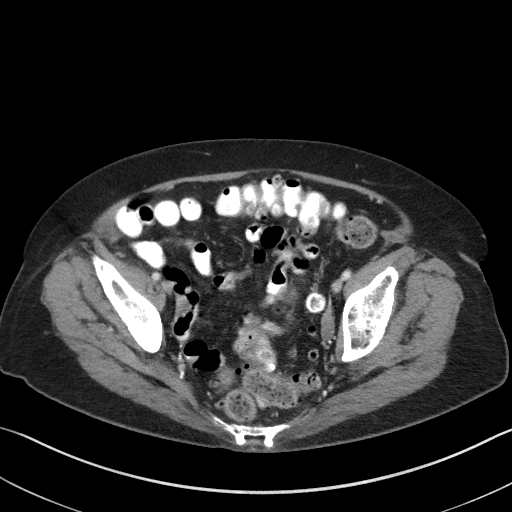
[im 30/83  soft-tissue]
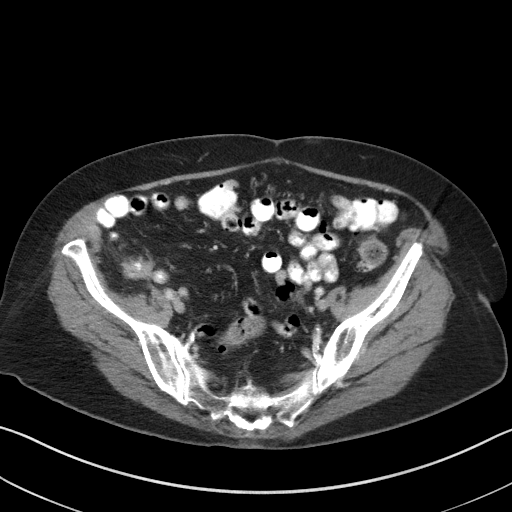
[im 36/83  soft-tissue]
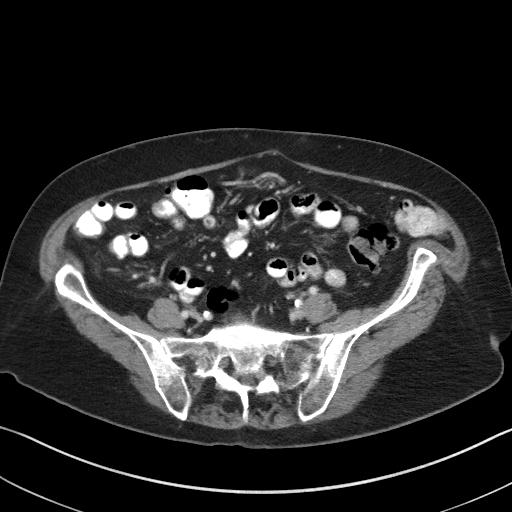
[im 42/83  soft-tissue]
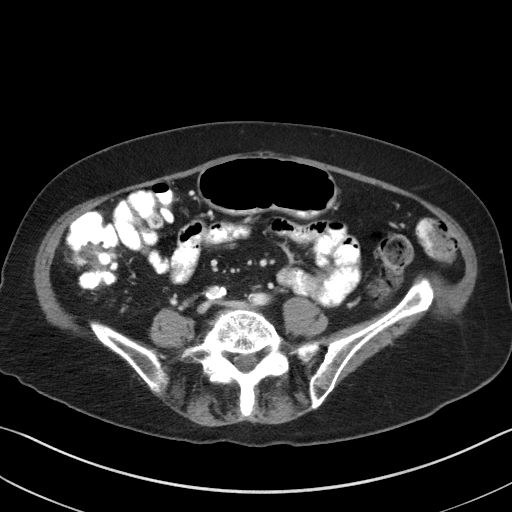
[im 47/83  soft-tissue]
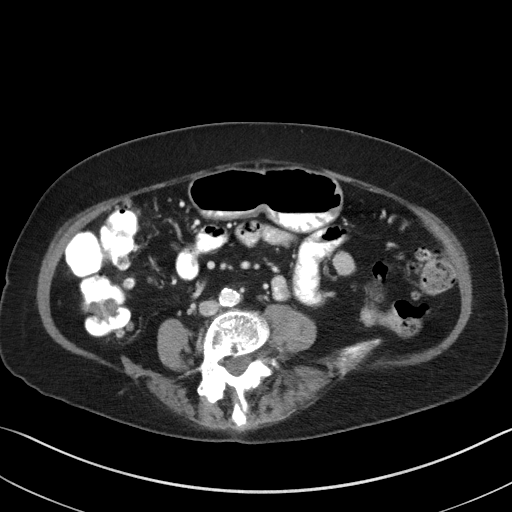
[im 53/83  soft-tissue]
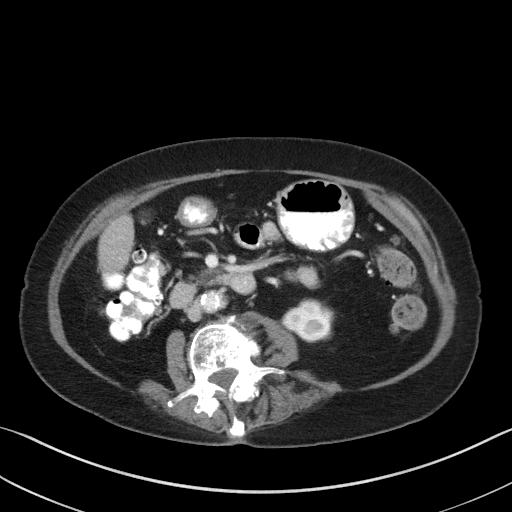
[im 53/83  bone]
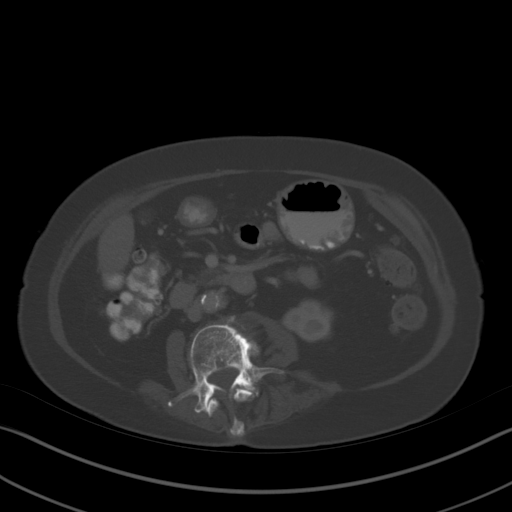
[im 59/83  soft-tissue]
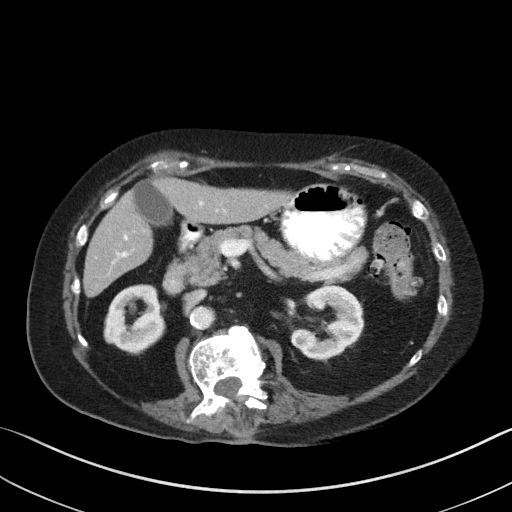
[im 59/83  lung]
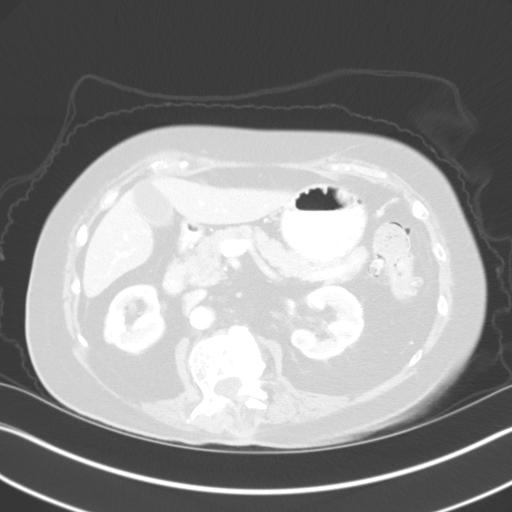
[im 65/83  soft-tissue]
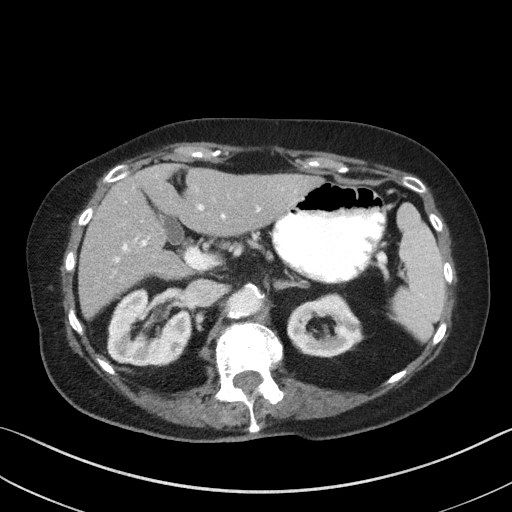
[im 65/83  lung]
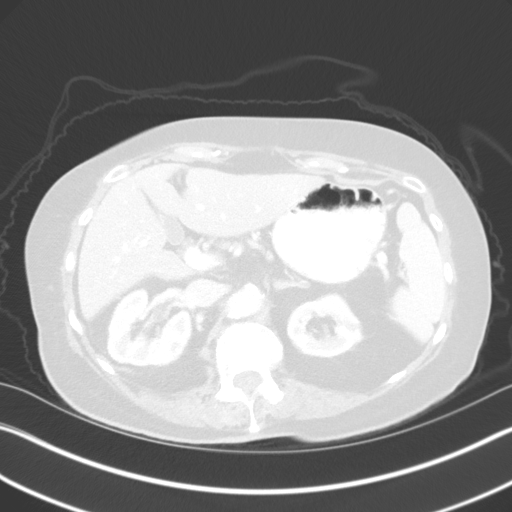
[im 71/83  soft-tissue]
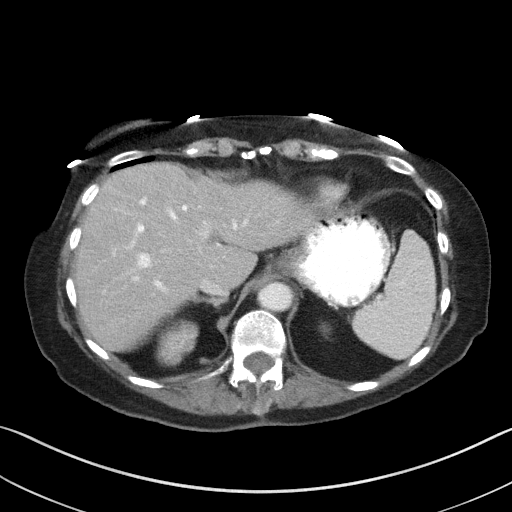
[im 71/83  lung]
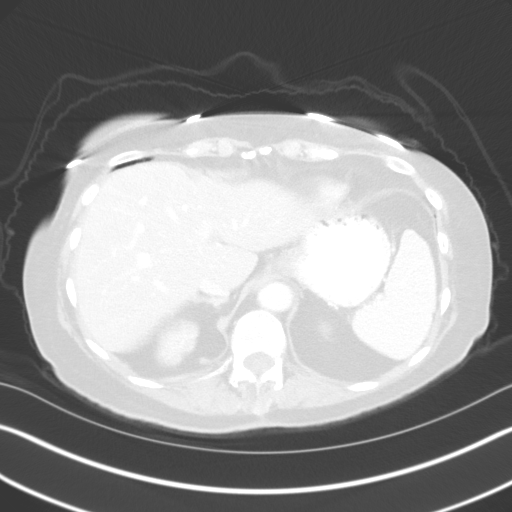
[im 77/83  soft-tissue]
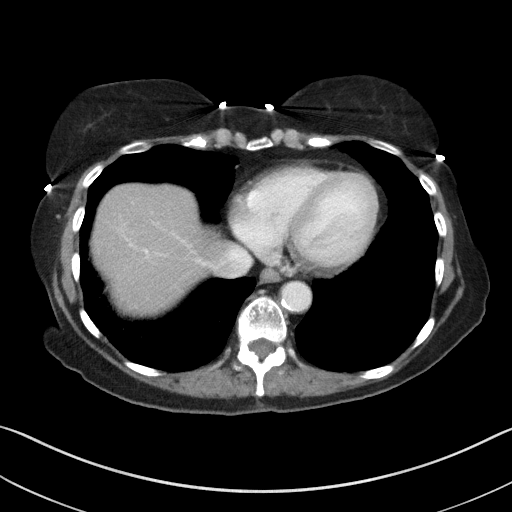
[im 77/83  lung]
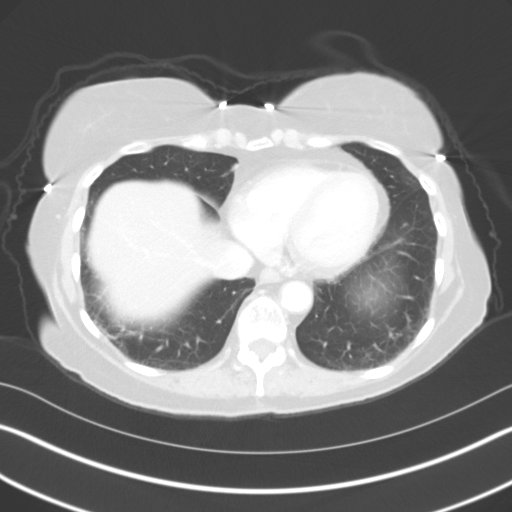

[13 of 32 positions shown; findings below may reference images not displayed]

FINDINGS: Lower chest: No acute abnormality. Coronary artery and proximal
thoracic aortic atherosclerotic calcifications noted.

Hepatobiliary: The liver is unremarkable. Cholelithiasis again
identified without CT evidence of acute cholecystitis. There is no
evidence of biliary dilatation.

Pancreas: Unremarkable

Spleen: Unremarkable

Adrenals/Urinary Tract: Bilateral renal cortical atrophy again
noted. No hydronephrosis, renal mass or ureteral calculi identified.
A calcification within the bladder/ left UPJ region noted. The
adrenal glands are unremarkable.

Stomach/Bowel: Interval resolution of para-appendiceal abscess and
inflammation noted with small amount of soft tissue/
postinflammatory changes in the area. Oral contrast now fills the
appendix. Colonic diverticulosis again identified without evidence
of acute diverticulitis. There is no evidence of bowel obstruction
or pneumoperitoneum.

Vascular/Lymphatic: Aortic atherosclerotic calcifications noted
without aneurysm. No enlarged lymph nodes identified.

Reproductive: Unremarkable

Other: No free fluid.

Musculoskeletal: No acute abnormality. Lumbar scoliosis and
degenerative disc disease/spondylosis again noted.
IMPRESSION: Resolution of para-appendiceal abscess and inflammation, with small
amount of residual soft tissue/postinflammatory changes. Oral
contrast now fills the appendix.

No acute abnormalities.

Unchanged 5 mm calcification within the bladder/left UPJ region. No
evidence of hydroureter or hydronephrosis.

Cholelithiasis and colonic diverticulosis.

Abdominal aortic atherosclerosis.

## 2018-06-02 NOTE — Progress Notes (Signed)
Received lab results.

## 2018-06-16 ENCOUNTER — Encounter: Payer: Self-pay | Admitting: Emergency Medicine

## 2018-06-16 ENCOUNTER — Other Ambulatory Visit: Payer: Self-pay

## 2018-06-16 ENCOUNTER — Ambulatory Visit: Payer: Self-pay

## 2018-06-16 ENCOUNTER — Emergency Department: Payer: Medicare Other

## 2018-06-16 ENCOUNTER — Inpatient Hospital Stay
Admission: EM | Admit: 2018-06-16 | Discharge: 2018-06-19 | DRG: 419 | Disposition: A | Discharge status: Home / Self Care | Payer: Medicare Other | Attending: Surgery | Admitting: Surgery

## 2018-06-16 ENCOUNTER — Emergency Department
Admission: EM | Admit: 2018-06-16 | Discharge: 2018-06-16 | Disposition: A | Discharge status: Home / Self Care | Payer: Medicare Other | Source: Home / Self Care | Attending: Emergency Medicine | Admitting: Emergency Medicine

## 2018-06-16 DIAGNOSIS — Z8673 Personal history of transient ischemic attack (TIA), and cerebral infarction without residual deficits: Secondary | ICD-10-CM

## 2018-06-16 DIAGNOSIS — E1122 Type 2 diabetes mellitus with diabetic chronic kidney disease: Secondary | ICD-10-CM | POA: Insufficient documentation

## 2018-06-16 DIAGNOSIS — Z794 Long term (current) use of insulin: Secondary | ICD-10-CM | POA: Insufficient documentation

## 2018-06-16 DIAGNOSIS — R079 Chest pain, unspecified: Secondary | ICD-10-CM | POA: Diagnosis not present

## 2018-06-16 DIAGNOSIS — Z9841 Cataract extraction status, right eye: Secondary | ICD-10-CM

## 2018-06-16 DIAGNOSIS — Z8249 Family history of ischemic heart disease and other diseases of the circulatory system: Secondary | ICD-10-CM

## 2018-06-16 DIAGNOSIS — K8000 Calculus of gallbladder with acute cholecystitis without obstruction: Principal | ICD-10-CM | POA: Diagnosis present

## 2018-06-16 DIAGNOSIS — E785 Hyperlipidemia, unspecified: Secondary | ICD-10-CM | POA: Diagnosis present

## 2018-06-16 DIAGNOSIS — Z9842 Cataract extraction status, left eye: Secondary | ICD-10-CM

## 2018-06-16 DIAGNOSIS — Z7982 Long term (current) use of aspirin: Secondary | ICD-10-CM

## 2018-06-16 DIAGNOSIS — Z961 Presence of intraocular lens: Secondary | ICD-10-CM | POA: Diagnosis present

## 2018-06-16 DIAGNOSIS — N183 Chronic kidney disease, stage 3 (moderate): Secondary | ICD-10-CM

## 2018-06-16 DIAGNOSIS — R1011 Right upper quadrant pain: Secondary | ICD-10-CM | POA: Diagnosis not present

## 2018-06-16 DIAGNOSIS — K8020 Calculus of gallbladder without cholecystitis without obstruction: Secondary | ICD-10-CM | POA: Diagnosis not present

## 2018-06-16 DIAGNOSIS — Z66 Do not resuscitate: Secondary | ICD-10-CM | POA: Diagnosis not present

## 2018-06-16 DIAGNOSIS — Z79899 Other long term (current) drug therapy: Secondary | ICD-10-CM

## 2018-06-16 DIAGNOSIS — K5730 Diverticulosis of large intestine without perforation or abscess without bleeding: Secondary | ICD-10-CM | POA: Diagnosis present

## 2018-06-16 DIAGNOSIS — I129 Hypertensive chronic kidney disease with stage 1 through stage 4 chronic kidney disease, or unspecified chronic kidney disease: Secondary | ICD-10-CM

## 2018-06-16 DIAGNOSIS — Z818 Family history of other mental and behavioral disorders: Secondary | ICD-10-CM

## 2018-06-16 DIAGNOSIS — Z888 Allergy status to other drugs, medicaments and biological substances status: Secondary | ICD-10-CM

## 2018-06-16 DIAGNOSIS — Z9049 Acquired absence of other specified parts of digestive tract: Secondary | ICD-10-CM | POA: Diagnosis present

## 2018-06-16 DIAGNOSIS — K810 Acute cholecystitis: Secondary | ICD-10-CM | POA: Diagnosis not present

## 2018-06-16 DIAGNOSIS — R0789 Other chest pain: Secondary | ICD-10-CM | POA: Diagnosis not present

## 2018-06-16 DIAGNOSIS — R109 Unspecified abdominal pain: Secondary | ICD-10-CM

## 2018-06-16 DIAGNOSIS — R1032 Left lower quadrant pain: Secondary | ICD-10-CM | POA: Diagnosis not present

## 2018-06-16 LAB — COMPREHENSIVE METABOLIC PANEL
ALT: 29 U/L (ref 0–44)
AST: 33 U/L (ref 15–41)
Albumin: 4 g/dL (ref 3.5–5.0)
Alkaline Phosphatase: 72 U/L (ref 38–126)
Anion gap: 6 (ref 5–15)
BUN: 25 mg/dL — AB (ref 8–23)
CHLORIDE: 108 mmol/L (ref 98–111)
CO2: 24 mmol/L (ref 22–32)
Calcium: 9.2 mg/dL (ref 8.9–10.3)
Creatinine, Ser: 1.05 mg/dL — ABNORMAL HIGH (ref 0.44–1.00)
GFR calc Af Amer: 57 mL/min — ABNORMAL LOW (ref >60)
GFR, EST NON AFRICAN AMERICAN: 49 mL/min — AB (ref >60)
Glucose, Bld: 209 mg/dL — ABNORMAL HIGH (ref 70–99)
POTASSIUM: 3.8 mmol/L (ref 3.5–5.1)
SODIUM: 138 mmol/L (ref 135–145)
Total Bilirubin: 0.6 mg/dL (ref 0.3–1.2)
Total Protein: 6.7 g/dL (ref 6.5–8.1)

## 2018-06-16 LAB — TROPONIN I

## 2018-06-16 LAB — CBC
HCT: 36.3 % (ref 36.0–46.0)
Hemoglobin: 12.7 g/dL (ref 12.0–15.0)
MCH: 35.7 pg — AB (ref 26.0–34.0)
MCHC: 35 g/dL (ref 30.0–36.0)
MCV: 102 fL — ABNORMAL HIGH (ref 80.0–100.0)
NRBC: 0 % (ref 0.0–0.2)
PLATELETS: 205 10*3/uL (ref 150–400)
RBC: 3.56 MIL/uL — AB (ref 3.87–5.11)
RDW: 13.3 % (ref 11.5–15.5)
WBC: 6.5 10*3/uL (ref 4.0–10.5)

## 2018-06-16 LAB — GLUCOSE, CAPILLARY: GLUCOSE-CAPILLARY: 228 mg/dL — AB (ref 70–99)

## 2018-06-16 MED ORDER — TRAMADOL HCL 50 MG PO TABS: 50.0000 mg | ORAL_TABLET | Freq: Four times a day (QID) | ORAL | 0 refills | Status: DC | PRN

## 2018-06-16 NOTE — Telephone Encounter (Signed)
Pt called with C/O pain to shoulder that wraps to back. Pt states the pain started on Saturday and would come and go. Today her BP is 166/88 with a BS 207. Pt states she has a Hx of stroke and that her parents had heart disease. She rates the pain as moderate but almost severe and progressing. Today the pain is constant. Pt states that she has not injured her shoulder or done anything that would have caused the pain. Pt denies other symptoms. Per protocol patient husband will take her to the ED. Care advice read to patient. Patient verbalized understanding of all. Reason for Disposition . Pain also present in shoulder(s) or arm(s) or jaw  (Exception: pain is clearly made worse by movement)  Answer Assessment - Initial Assessment Questions 1. LOCATION: "Where does it hurt?"       Shoulder to back 2. RADIATION: "Does the pain go anywhere else?" (e.g., into neck, jaw, arms, back)     back 3. ONSET: "When did the chest pain begin?" (Minutes, hours or days)      yesterday 4. PATTERN "Does the pain come and go, or has it been constant since it started?"  "Does it get worse with exertion?"      costant at 5 am this morning 5. DURATION: "How long does it last" (e.g., seconds, minutes, hours)     seconds 6. SEVERITY: "How bad is the pain?"  (e.g., Scale 1-10; mild, moderate, or severe)    - MILD (1-3): doesn't interfere with normal activities     - MODERATE (4-7): interferes with normal activities or awakens from sleep    - SEVERE (8-10): excruciating pain, unable to do any normal activities       7 moderate 7. CARDIAC RISK FACTORS: "Do you have any history of heart problems or risk factors for heart disease?" (e.g., prior heart attack, angina; high blood pressure, diabetes, being overweight, high cholesterol, smoking, or strong family history of heart disease)    Htn DM Hx of Heart in familty 8. PULMONARY RISK FACTORS: "Do you have any history of lung disease?"  (e.g., blood clots in lung, asthma,  emphysema, birth control pills)    no 9. CAUSE: "What do you think is causing the chest pain?"     no 10. OTHER SYMPTOMS: "Do you have any other symptoms?" (e.g., dizziness, nausea, vomiting, sweating, fever, difficulty breathing, cough)       Just don't feel good 11. PREGNANCY: "Is there any chance you are pregnant?" "When was your last menstrual period?"     N/A  Protocols used: CHEST PAIN-A-AH

## 2018-06-16 NOTE — ED Notes (Signed)
Lab reported they will run labs now

## 2018-06-16 NOTE — ED Notes (Signed)
Pt ambulated to the toilet in the room with minimal assist. Adjusted in bed. Pt refused crackers.

## 2018-06-16 NOTE — ED Notes (Signed)

## 2018-06-16 NOTE — ED Provider Notes (Addendum)
[facility] Emergency Department Provider Note  Time seen: 12:37 PM  I have reviewed the triage vital signs and the nursing notes.   HISTORY  Chief Complaint Chest Pain    HPI Laurie Harris is a 77 y.o. female with a past medical history of CKD, diabetes, hypertension, hyperlipidemia presents to the emergency department with concerns of chest discomfort.  According to the patient around 5:00 this morning she awoke with discomfort to her left chest/left shoulder.  Denies any nausea, shortness of breath or diaphoresis.  Denies any pain with movement of the left shoulder.  Denies any pleuritic pain.  Denies any cough congestion or sputum production.  Denies any fever.  Denies abdominal pain vomiting or diarrhea.  Largely negative review of systems.  Denies any discomfort currently states the pain waxes and wanes throughout the day sometimes it will be moderate aching type pain and sometimes it will resolve.   Past Medical History:  Diagnosis Date  . Carotid arterial disease ([place])    a. 09/2016 Carotid U/S: R carotid bifurcation dzs of 50-69%, L carotid bifurcation dzs of <50%. Patent vertebral arteries w/ anegrade flow.  . Chicken pox   . CKD (chronic kidney disease), stage III (HCC)    functioning at 46%  . Diabetes mellitus without complication ([place])   . Hemorrhagic stroke ([place]) 12/31/2014   a. in the setting of warfarin therapy.  . Hyperlipidemia   . Hypertension   . Syncope    a. 09/2016 Echo: EF 55-60%, no rwma, Gr1 DD.    Patient Active Problem List   Diagnosis Date Noted  . Subconjunctival hemorrhage of left eye 04/29/2018  . Osteoporosis 12/26/2017  . Aorto-iliac atherosclerosis ([place]) 09/20/2017  . Renal cyst 09/20/2017  . Macrocytosis without anemia 09/20/2017  . Abdominal pain 11/05/2016  . Syncope 09/10/2016  . Appendicitis 08/07/2016  . Carotid artery disease ([place]) 06/19/2016  . CKD (chronic kidney disease) stage 3, GFR 30-59 ml/min (HCC)  05/10/2016  . Diabetes mellitus type 2, controlled, with complications ([place]) 41/32/4401  . Benign essential HTN 03/16/2015  . Hyperlipidemia 03/16/2015  . Cerebellar hemorrhage ([place]) 03/16/2015  . Adenomatous polyp of colon 06/04/2014  . Carotid artery occlusion without infarction 08/18/2013    Past Surgical History:  Procedure Laterality Date  . ABCESS DRAINAGE  06/14/2016   appendix  . APPENDECTOMY  06/13/2016  . CATARACT EXTRACTION W/PHACO Left 09/04/2017   Procedure: CATARACT EXTRACTION PHACO AND INTRAOCULAR LENS PLACEMENT ([place]) COMPLICATED LEFT DIABETIC;  Surgeon: Leandrew Koyanagi, MD;  Location: [facility];  Service: Ophthalmology;  Laterality: Left;  Diabetic - insulin  . CATARACT EXTRACTION W/PHACO Right 10/02/2017   Procedure: CATARACT EXTRACTION PHACO AND INTRAOCULAR LENS PLACEMENT ([place]) COMPLICATED  RIGHT DIABETIC;  Surgeon: Leandrew Koyanagi, MD;  Location: [facility];  Service: Ophthalmology;  Laterality: Right;  Diabetic - insulin  . COLONOSCOPY    . NECK SURGERY      Prior to Admission medications   Medication Sig Start Date End Date Taking? Authorizing Provider  aspirin EC 81 MG tablet Take 81 mg by mouth daily.    [provider]  atorvastatin (LIPITOR) 80 MG tablet TAKE 1 TABLET BY MOUTH DAILY 03/19/18   [name], MD  benazepril (LOTENSIN) 5 MG tablet Take 2.5mg  (half a tablet) by mouth at night. 04/29/18   [name], MD  Calcium Carbonate (CALCIUM 600 PO) Take by mouth.    [provider]  calcium-vitamin D (OSCAL WITH D) 500-200 MG-UNIT tablet  Take 1 tablet by mouth 2 (two) times daily.    [provider]  cholecalciferol (VITAMIN D) 1000 units tablet Take 2,000 Units by mouth daily.    [provider]  fenofibrate 160 MG tablet TAKE 1 TABLET BY MOUTH DAILY 03/24/18   [name], MD  insulin lispro protamine-lispro (HUMALOG 75/25 MIX) (75-25) 100 UNIT/ML SUSP injection Inject into the  skin. Inject 19 units in the morning and 18 at night    [provider]  Multiple Vitamin (MULTIVITAMIN WITH MINERALS) TABS tablet Take 1 tablet by mouth daily.    [provider]  NOVOFINE PLUS 32G X 4 MM MISC USE TWICE A DAY AS DIRECTED 02/25/18   [name], MD  Omega-3 Fatty Acids (FISH OIL) 1200 MG CAPS Take 1,200 mg by mouth daily.    [provider]  pantoprazole (PROTONIX) 40 MG tablet TAKE ONE TABLET BY MOUTH EVERY DAY 04/15/18   [name], MD    Allergies  Allergen Reactions  . Warfarin And Related     Possible stroke    Family History  Problem Relation Age of Onset  . Diabetes Mother   . Dementia Mother   . Heart disease Mother   . Cancer Mother        lung  . Cancer Father        colon  . Stroke Daughter   . Hyperlipidemia Daughter   . Breast cancer Neg Hx     Social History Social History   Tobacco Use  . Smoking status: Never Smoker  . Smokeless tobacco: Never Used  Substance Use Topics  . Alcohol use: No    Alcohol/week: 0.0 standard drinks  . Drug use: No    Review of Systems Constitutional: Negative for fever. Cardiovascular: Intermittent left-sided chest discomfort. Respiratory: Negative for shortness of breath. Gastrointestinal: Negative for abdominal pain, vomiting  Musculoskeletal: Negative for leg pain Skin: Negative for skin complaints  Neurological: Negative for headache All other ROS negative  ____________________________________________   PHYSICAL EXAM:  VITAL SIGNS: ED Triage Vitals  Enc Vitals Group     BP 06/16/18 1017 (!) 152/69     Pulse Rate 06/16/18 1017 84     Resp 06/16/18 1017 18     Temp 06/16/18 1017 97.6 F (36.4 C)     Temp src --      SpO2 06/16/18 1017 96 %     Weight 06/16/18 1034 125 lb (56.7 kg)     Height 06/16/18 1034 5\' 6"  (1.676 m)     Head Circumference --      Peak Flow --      Pain Score 06/16/18 1034 9     Pain Loc --      Pain Edu? --      Excl. in [place]?  --    Constitutional: Alert and oriented. Well appearing and in no distress. Eyes: Normal exam ENT   Head: Normocephalic and atraumatic.   Mouth/Throat: Mucous membranes are moist. Cardiovascular: Normal rate, regular rhythm. No murmur Respiratory: Normal respiratory effort without tachypnea nor retractions. Breath sounds are clear  Gastrointestinal: Soft and nontender. No distention.   Musculoskeletal: Nontender with normal range of motion in all extremities.  Neurologic:  Normal speech and language. No gross focal neurologic deficits Skin:  Skin is warm, dry and intact.  Psychiatric: Mood and affect are normal.  ____________________________________________    EKG  EKG reviewed and interpreted by myself shows normal sinus rhythm 84 bpm  with a narrow QRS, normal axis, normal intervals, nonspecific ST changes without ST elevation.  ____________________________________________    RADIOLOGY  Chest x-ray shows chronic changes without acute infiltrate.  ____________________________________________   INITIAL IMPRESSION / ASSESSMENT AND PLAN / ED COURSE  Pertinent labs & imaging results that were available during my care of the patient were reviewed by me and considered in my medical decision making (see chart for details).  Patient presents emergency department for chest discomfort and left chest is been waxing and waning throughout the day today.  Differential would include pneumonia, ACS, angina, chest wall pain.  Reassuringly patient's EKG is overall well-appearing, nonspecific but no concerning findings.  Labs are largely at baseline including a negative troponin.  Chest x-ray is reassuring.  Overall the patient appears well denies any discomfort currently but states the pain has been waxing and waning throughout the day today.  Patient denies any cardiac history.  Does not have a cardiologist.  Given the patient's ongoing discomfort and she states it is very intermittent  and sometimes it is in her chest sometimes it is in her arm sometimes is in her upper abdomen sometimes in her back.  I offered to admit the patient to the hospital for continued work-up and cardiology consultation.  Patient states she was strongly wish to go home, states she will follow-up with her doctor.  She denies any pain at this time but states it comes and goes.  She is agreeable to stay for a repeat troponin.  If the patient's repeat troponin is negative and she still wishes to go home I believe it would be reasonable to discharge her home and have her follow-up with a cardiologist and her primary care doctor.  Patient agreeable to plan of care.  Repeat troponin is negative.  Patient still wishes to be discharged home.  We will discharge with a short course of tramadol have the patient follow-up with a cardiologist as well as her primary care doctor.  Patient will call her primary care doctor tomorrow as well as cardiology.  ____________________________________________   FINAL CLINICAL IMPRESSION(S) / ED DIAGNOSES  Chest pain    Harvest Dark, MD 06/16/18 1338    Harvest Dark, MD 06/16/18 1438

## 2018-06-16 NOTE — ED Triage Notes (Signed)
Awoke at 5 am with upper L chest pain.

## 2018-06-16 NOTE — Discharge Instructions (Addendum)
Please call the number provided for cardiology today or tomorrow morning to arrange a follow-up appointment as soon as possible.  Please follow-up with your primary care doctor tomorrow for recheck/reevaluation.  If you have any return of/worsening chest pain or development of any shortness of breath, or any other symptom personally concerning to yourself please return to the emergency department for further evaluation.

## 2018-06-16 NOTE — ED Notes (Signed)
20G PIV removed from RAC, tip intact. Dressing applied.

## 2018-06-17 ENCOUNTER — Emergency Department: Payer: Medicare Other

## 2018-06-17 ENCOUNTER — Other Ambulatory Visit: Payer: Self-pay

## 2018-06-17 ENCOUNTER — Observation Stay: Payer: Medicare Other | Admitting: Anesthesiology

## 2018-06-17 ENCOUNTER — Encounter
Admission: EM | Disposition: A | Discharge status: Home / Self Care | Payer: Self-pay | Source: Home / Self Care | Attending: Surgery

## 2018-06-17 ENCOUNTER — Encounter: Payer: Self-pay | Admitting: *Deleted

## 2018-06-17 DIAGNOSIS — K8020 Calculus of gallbladder without cholecystitis without obstruction: Secondary | ICD-10-CM | POA: Diagnosis not present

## 2018-06-17 DIAGNOSIS — Z794 Long term (current) use of insulin: Secondary | ICD-10-CM | POA: Diagnosis not present

## 2018-06-17 DIAGNOSIS — K8000 Calculus of gallbladder with acute cholecystitis without obstruction: Secondary | ICD-10-CM | POA: Diagnosis not present

## 2018-06-17 DIAGNOSIS — Z9049 Acquired absence of other specified parts of digestive tract: Secondary | ICD-10-CM | POA: Diagnosis present

## 2018-06-17 DIAGNOSIS — I739 Peripheral vascular disease, unspecified: Secondary | ICD-10-CM | POA: Diagnosis not present

## 2018-06-17 DIAGNOSIS — K810 Acute cholecystitis: Secondary | ICD-10-CM | POA: Diagnosis not present

## 2018-06-17 DIAGNOSIS — K5730 Diverticulosis of large intestine without perforation or abscess without bleeding: Secondary | ICD-10-CM | POA: Diagnosis not present

## 2018-06-17 DIAGNOSIS — E119 Type 2 diabetes mellitus without complications: Secondary | ICD-10-CM | POA: Diagnosis not present

## 2018-06-17 HISTORY — PX: CHOLECYSTECTOMY: SHX55

## 2018-06-17 LAB — LACTIC ACID, PLASMA
Lactic Acid, Venous: 2.1 mmol/L (ref 0.5–1.9)
Lactic Acid, Venous: 1.8 mmol/L (ref 0.5–1.9)

## 2018-06-17 LAB — COMPREHENSIVE METABOLIC PANEL
ALK PHOS: 59 U/L (ref 38–126)
ALT: 30 U/L (ref 0–44)
AST: 39 U/L (ref 15–41)
Albumin: 4.1 g/dL (ref 3.5–5.0)
Anion gap: 9 (ref 5–15)
BUN: 20 mg/dL (ref 8–23)
CALCIUM: 9.1 mg/dL (ref 8.9–10.3)
CO2: 24 mmol/L (ref 22–32)
CREATININE: 0.96 mg/dL (ref 0.44–1.00)
Chloride: 102 mmol/L (ref 98–111)
GFR, EST NON AFRICAN AMERICAN: 55 mL/min — AB (ref >60)
Glucose, Bld: 251 mg/dL — ABNORMAL HIGH (ref 70–99)
Potassium: 4.3 mmol/L (ref 3.5–5.1)
Sodium: 135 mmol/L (ref 135–145)
Total Bilirubin: 1.1 mg/dL (ref 0.3–1.2)
Total Protein: 7.2 g/dL (ref 6.5–8.1)

## 2018-06-17 LAB — LIPASE, BLOOD: Lipase: 30 U/L (ref 11–51)

## 2018-06-17 LAB — CBC
HCT: 38.8 % (ref 36.0–46.0)
Hemoglobin: 13.5 g/dL (ref 12.0–15.0)
MCH: 34.6 pg — ABNORMAL HIGH (ref 26.0–34.0)
MCHC: 34.8 g/dL (ref 30.0–36.0)
MCV: 99.5 fL (ref 80.0–100.0)
PLATELETS: 208 10*3/uL (ref 150–400)
RBC: 3.9 MIL/uL (ref 3.87–5.11)
RDW: 13.2 % (ref 11.5–15.5)
WBC: 15.6 10*3/uL — ABNORMAL HIGH (ref 4.0–10.5)
nRBC: 0 % (ref 0.0–0.2)

## 2018-06-17 LAB — GLUCOSE, CAPILLARY
GLUCOSE-CAPILLARY: 224 mg/dL — AB (ref 70–99)
GLUCOSE-CAPILLARY: 211 mg/dL — AB (ref 70–99)
Glucose-Capillary: 162 mg/dL — ABNORMAL HIGH (ref 70–99)
Glucose-Capillary: 166 mg/dL — ABNORMAL HIGH (ref 70–99)
Glucose-Capillary: 176 mg/dL — ABNORMAL HIGH (ref 70–99)
Glucose-Capillary: 194 mg/dL — ABNORMAL HIGH (ref 70–99)

## 2018-06-17 LAB — APTT: APTT: 28 s (ref 24–36)

## 2018-06-17 LAB — PROTIME-INR
INR: 1.04
Prothrombin Time: 13.5 seconds (ref 11.4–15.2)

## 2018-06-17 LAB — TROPONIN I: Troponin I: 0.03 ng/mL (ref <0.03)

## 2018-06-17 SURGERY — LAPAROSCOPIC CHOLECYSTECTOMY
Anesthesia: General

## 2018-06-17 MED ORDER — INSULIN ASPART 100 UNIT/ML ~~LOC~~ SOLN
SUBCUTANEOUS | Status: AC
  Administered 2018-06-17: 5 [IU] via SUBCUTANEOUS
  Filled 2018-06-17: qty 1

## 2018-06-17 MED ORDER — PIPERACILLIN-TAZOBACTAM 3.375 G IVPB
3.3750 g | Freq: Three times a day (TID) | INTRAVENOUS | Status: DC
  Administered 2018-06-17: 3.375 g via INTRAVENOUS
  Filled 2018-06-17: qty 50

## 2018-06-17 MED ORDER — ENOXAPARIN SODIUM 40 MG/0.4ML ~~LOC~~ SOLN
40.0000 mg | SUBCUTANEOUS | Status: DC
  Administered 2018-06-18: 40 mg via SUBCUTANEOUS
  Filled 2018-06-17: qty 0.4

## 2018-06-17 MED ORDER — BUPIVACAINE HCL (PF) 0.5 % IJ SOLN
INTRAMUSCULAR | Status: AC
  Filled 2018-06-17: qty 30

## 2018-06-17 MED ORDER — ONDANSETRON HCL 4 MG/2ML IJ SOLN: 4.0000 mg | Freq: Four times a day (QID) | INTRAMUSCULAR | Status: DC | PRN

## 2018-06-17 MED ORDER — ONDANSETRON 4 MG PO TBDP: 4.0000 mg | ORAL_TABLET | Freq: Four times a day (QID) | ORAL | Status: DC | PRN

## 2018-06-17 MED ORDER — FENTANYL CITRATE (PF) 100 MCG/2ML IJ SOLN
INTRAMUSCULAR | Status: DC | PRN
  Administered 2018-06-17: 25 ug via INTRAVENOUS
  Administered 2018-06-17: 50 ug via INTRAVENOUS
  Administered 2018-06-17: 100 ug via INTRAVENOUS
  Administered 2018-06-17: 25 ug via INTRAVENOUS

## 2018-06-17 MED ORDER — LACTATED RINGERS IV SOLN
INTRAVENOUS | Status: DC
  Administered 2018-06-17 (×2): via INTRAVENOUS

## 2018-06-17 MED ORDER — OXYCODONE HCL 5 MG PO TABS: 5.0000 mg | ORAL_TABLET | Freq: Once | ORAL | Status: DC | PRN

## 2018-06-17 MED ORDER — SUGAMMADEX SODIUM 200 MG/2ML IV SOLN
INTRAVENOUS | Status: AC
  Filled 2018-06-17: qty 2

## 2018-06-17 MED ORDER — SODIUM CHLORIDE 0.9 % IV SOLN
INTRAVENOUS | Status: DC | PRN
  Administered 2018-06-17: 15:00:00 via INTRAVENOUS

## 2018-06-17 MED ORDER — PROPOFOL 10 MG/ML IV BOLUS
INTRAVENOUS | Status: AC
  Filled 2018-06-17: qty 20

## 2018-06-17 MED ORDER — ACETAMINOPHEN 500 MG PO TABS
1000.0000 mg | ORAL_TABLET | Freq: Four times a day (QID) | ORAL | Status: DC
  Administered 2018-06-17: 1000 mg via ORAL
  Filled 2018-06-17 (×4): qty 2

## 2018-06-17 MED ORDER — PHENYLEPHRINE HCL 10 MG/ML IJ SOLN
INTRAMUSCULAR | Status: AC
  Filled 2018-06-17: qty 1

## 2018-06-17 MED ORDER — FENTANYL CITRATE (PF) 100 MCG/2ML IJ SOLN
INTRAMUSCULAR | Status: AC
  Filled 2018-06-17: qty 2

## 2018-06-17 MED ORDER — FAMOTIDINE IN NACL 20-0.9 MG/50ML-% IV SOLN
20.0000 mg | Freq: Two times a day (BID) | INTRAVENOUS | Status: DC
  Administered 2018-06-17 – 2018-06-18 (×4): 20 mg via INTRAVENOUS
  Filled 2018-06-17 (×4): qty 50

## 2018-06-17 MED ORDER — ROCURONIUM BROMIDE 50 MG/5ML IV SOLN
INTRAVENOUS | Status: AC
  Filled 2018-06-17: qty 1

## 2018-06-17 MED ORDER — INSULIN ASPART 100 UNIT/ML ~~LOC~~ SOLN
0.0000 [IU] | Freq: Three times a day (TID) | SUBCUTANEOUS | Status: DC
  Administered 2018-06-17 (×2): 3 [IU] via SUBCUTANEOUS
  Administered 2018-06-18: 5 [IU] via SUBCUTANEOUS
  Administered 2018-06-18 – 2018-06-19 (×3): 2 [IU] via SUBCUTANEOUS
  Filled 2018-06-17 (×6): qty 1

## 2018-06-17 MED ORDER — ONDANSETRON HCL 4 MG/2ML IJ SOLN
INTRAMUSCULAR | Status: AC
  Filled 2018-06-17: qty 2

## 2018-06-17 MED ORDER — INSULIN ASPART 100 UNIT/ML ~~LOC~~ SOLN
5.0000 [IU] | Freq: Once | SUBCUTANEOUS | Status: AC
  Administered 2018-06-17: 5 [IU] via SUBCUTANEOUS

## 2018-06-17 MED ORDER — PIPERACILLIN-TAZOBACTAM 3.375 G IVPB 30 MIN: 3.3750 g | Freq: Once | INTRAVENOUS | Status: DC

## 2018-06-17 MED ORDER — SODIUM CHLORIDE 0.9 % IV BOLUS
500.0000 mL | Freq: Once | INTRAVENOUS | Status: AC
  Administered 2018-06-17: 500 mL via INTRAVENOUS

## 2018-06-17 MED ORDER — PROPOFOL 10 MG/ML IV BOLUS
INTRAVENOUS | Status: DC | PRN
  Administered 2018-06-17: 140 mg via INTRAVENOUS

## 2018-06-17 MED ORDER — PIPERACILLIN-TAZOBACTAM 3.375 G IVPB 30 MIN
3.3750 g | Freq: Once | INTRAVENOUS | Status: AC
  Administered 2018-06-17: 3.375 g via INTRAVENOUS
  Filled 2018-06-17: qty 50

## 2018-06-17 MED ORDER — PHENYLEPHRINE HCL 10 MG/ML IJ SOLN
INTRAMUSCULAR | Status: DC | PRN
  Administered 2018-06-17 (×8): 100 ug via INTRAVENOUS

## 2018-06-17 MED ORDER — OXYCODONE HCL 5 MG/5ML PO SOLN: 5.0000 mg | Freq: Once | ORAL | Status: DC | PRN

## 2018-06-17 MED ORDER — DOCUSATE SODIUM 100 MG PO CAPS: 100.0000 mg | ORAL_CAPSULE | Freq: Two times a day (BID) | ORAL | Status: DC | PRN

## 2018-06-17 MED ORDER — INSULIN ASPART 100 UNIT/ML ~~LOC~~ SOLN
0.0000 [IU] | Freq: Every day | SUBCUTANEOUS | Status: DC
  Administered 2018-06-18: 2 [IU] via SUBCUTANEOUS
  Filled 2018-06-17: qty 1

## 2018-06-17 MED ORDER — LIDOCAINE HCL (CARDIAC) PF 100 MG/5ML IV SOSY
PREFILLED_SYRINGE | INTRAVENOUS | Status: DC | PRN
  Administered 2018-06-17: 100 mg via INTRAVENOUS

## 2018-06-17 MED ORDER — LIDOCAINE HCL (PF) 2 % IJ SOLN
INTRAMUSCULAR | Status: AC
  Filled 2018-06-17: qty 10

## 2018-06-17 MED ORDER — DEXAMETHASONE SODIUM PHOSPHATE 10 MG/ML IJ SOLN
INTRAMUSCULAR | Status: AC
  Filled 2018-06-17: qty 1

## 2018-06-17 MED ORDER — DEXAMETHASONE SODIUM PHOSPHATE 10 MG/ML IJ SOLN
INTRAMUSCULAR | Status: DC | PRN
  Administered 2018-06-17: 5 mg via INTRAVENOUS

## 2018-06-17 MED ORDER — TRAMADOL HCL 50 MG PO TABS: 50.0000 mg | ORAL_TABLET | Freq: Four times a day (QID) | ORAL | Status: DC | PRN

## 2018-06-17 MED ORDER — PROMETHAZINE HCL 25 MG/ML IJ SOLN: 6.2500 mg | INTRAMUSCULAR | Status: DC | PRN

## 2018-06-17 MED ORDER — FENTANYL CITRATE (PF) 100 MCG/2ML IJ SOLN: 25.0000 ug | INTRAMUSCULAR | Status: DC | PRN

## 2018-06-17 MED ORDER — LIDOCAINE-EPINEPHRINE (PF) 1 %-1:200000 IJ SOLN
INTRAMUSCULAR | Status: AC
  Filled 2018-06-17: qty 30

## 2018-06-17 MED ORDER — SODIUM CHLORIDE 0.9 % IV BOLUS: 500.0000 mL | Freq: Once | INTRAVENOUS | Status: DC

## 2018-06-17 MED ORDER — MEPERIDINE HCL 50 MG/ML IJ SOLN: 6.2500 mg | INTRAMUSCULAR | Status: DC | PRN

## 2018-06-17 MED ORDER — SUGAMMADEX SODIUM 200 MG/2ML IV SOLN
INTRAVENOUS | Status: DC | PRN
  Administered 2018-06-17: 120 mg via INTRAVENOUS

## 2018-06-17 MED ORDER — ROCURONIUM BROMIDE 100 MG/10ML IV SOLN
INTRAVENOUS | Status: DC | PRN
  Administered 2018-06-17: 40 mg via INTRAVENOUS

## 2018-06-17 MED ORDER — HYDROCODONE-ACETAMINOPHEN 5-325 MG PO TABS
1.0000 | ORAL_TABLET | ORAL | Status: DC | PRN
  Administered 2018-06-17 – 2018-06-18 (×3): 2 via ORAL
  Filled 2018-06-17 (×3): qty 2

## 2018-06-17 MED ORDER — ONDANSETRON HCL 4 MG/2ML IJ SOLN
INTRAMUSCULAR | Status: DC | PRN
  Administered 2018-06-17: 4 mg via INTRAVENOUS

## 2018-06-17 MED ORDER — PIPERACILLIN-TAZOBACTAM 3.375 G IVPB: 3.3750 g | Freq: Three times a day (TID) | INTRAVENOUS | Status: DC

## 2018-06-17 MED ORDER — LIDOCAINE-EPINEPHRINE (PF) 1 %-1:200000 IJ SOLN
INTRAMUSCULAR | Status: DC | PRN
  Administered 2018-06-17: 32 mL via INTRAMUSCULAR

## 2018-06-17 MED ORDER — MORPHINE SULFATE (PF) 2 MG/ML IV SOLN: 2.0000 mg | INTRAVENOUS | Status: DC | PRN

## 2018-06-17 MED ORDER — IOHEXOL 300 MG/ML  SOLN
75.0000 mL | Freq: Once | INTRAMUSCULAR | Status: AC | PRN
  Administered 2018-06-17: 75 mL via INTRAVENOUS

## 2018-06-17 SURGICAL SUPPLY — 63 items
APPLICATOR ARISTA FLEXITIP XL (MISCELLANEOUS) ×3 IMPLANT
APPLICATOR COTTON TIP 6 STRL (MISCELLANEOUS) IMPLANT
APPLICATOR COTTON TIP 6IN STRL (MISCELLANEOUS)
APPLIER CLIP 5 13 M/L LIGAMAX5 (MISCELLANEOUS) ×3
BLADE SURG SZ11 CARB STEEL (BLADE) ×3 IMPLANT
BULB RESERV EVAC DRAIN JP 100C (MISCELLANEOUS) ×3 IMPLANT
CANISTER SUCT 1200ML W/VALVE (MISCELLANEOUS) ×3 IMPLANT
CHLORAPREP W/TINT 26ML (MISCELLANEOUS) ×3 IMPLANT
CHOLANGIOGRAM CATH TAUT (CATHETERS) IMPLANT
CLIP APPLIE 5 13 M/L LIGAMAX5 (MISCELLANEOUS) ×1 IMPLANT
COVER WAND RF STERILE (DRAPES) IMPLANT
DECANTER SPIKE VIAL GLASS SM (MISCELLANEOUS) IMPLANT
DEFOGGER SCOPE WARMER CLEARIFY (MISCELLANEOUS) ×3 IMPLANT
DERMABOND ADVANCED (GAUZE/BANDAGES/DRESSINGS) ×2
DERMABOND ADVANCED .7 DNX12 (GAUZE/BANDAGES/DRESSINGS) ×1 IMPLANT
DISSECTOR BLUNT TIP ENDO 5MM (MISCELLANEOUS) IMPLANT
DISSECTOR KITTNER STICK (MISCELLANEOUS) ×1 IMPLANT
DISSECTORS/KITTNER STICK (MISCELLANEOUS) ×3
DRAIN CHANNEL JP 15F RND 16 (MISCELLANEOUS) ×3 IMPLANT
DRAPE C-ARM XRAY 36X54 (DRAPES) IMPLANT
DRAPE SHEET LG 3/4 BI-LAMINATE (DRAPES) IMPLANT
ELECT CAUTERY BLADE 6.4 (BLADE) ×3 IMPLANT
ELECT REM PT RETURN 9FT ADLT (ELECTROSURGICAL) ×3
ELECTRODE REM PT RTRN 9FT ADLT (ELECTROSURGICAL) ×1 IMPLANT
GLOVE BIOGEL PI IND STRL 7.0 (GLOVE) ×3 IMPLANT
GLOVE BIOGEL PI INDICATOR 7.0 (GLOVE) ×6
GLOVE SURG SYN 7.0 (GLOVE) ×3 IMPLANT
GOWN STRL REUS W/ TWL LRG LVL3 (GOWN DISPOSABLE) ×3 IMPLANT
GOWN STRL REUS W/TWL LRG LVL3 (GOWN DISPOSABLE) ×6
GRASPER SUT TROCAR 14GX15 (MISCELLANEOUS) ×3 IMPLANT
HEMOSTAT ARISTA ABSORB 1G (MISCELLANEOUS) ×3 IMPLANT
IRRIGATION STRYKERFLOW (MISCELLANEOUS) ×1 IMPLANT
IRRIGATOR STRYKERFLOW (MISCELLANEOUS) ×3
IV CATH ANGIO 12GX3 LT BLUE (NEEDLE) IMPLANT
IV NS 1000ML (IV SOLUTION) ×2
IV NS 1000ML BAXH (IV SOLUTION) ×1 IMPLANT
JACKSON PRATT 10 (INSTRUMENTS) IMPLANT
L-HOOK LAP DISP 36CM (ELECTROSURGICAL) ×3
LHOOK LAP DISP 36CM (ELECTROSURGICAL) ×1 IMPLANT
NEEDLE HYPO 22GX1.5 SAFETY (NEEDLE) ×3 IMPLANT
PACK LAP CHOLECYSTECTOMY (MISCELLANEOUS) ×3 IMPLANT
PENCIL ELECTRO HAND CTR (MISCELLANEOUS) ×3 IMPLANT
PORT ACCESS TROCAR AIRSEAL 5 (TROCAR) ×3 IMPLANT
POUCH SPECIMEN RETRIEVAL 10MM (ENDOMECHANICALS) ×3 IMPLANT
SCISSORS METZENBAUM CVD 33 (INSTRUMENTS) ×3 IMPLANT
SET TRI-LUMEN FLTR TB AIRSEAL (TUBING) ×3 IMPLANT
SLEEVE ENDOPATH XCEL 5M (ENDOMECHANICALS) ×3 IMPLANT
SPONGE DRAIN TRACH 4X4 STRL 2S (GAUZE/BANDAGES/DRESSINGS) ×3 IMPLANT
SPONGE LAP 18X18 RF (DISPOSABLE) IMPLANT
STOPCOCK 4 WAY LG BORE MALE ST (IV SETS) IMPLANT
SUT ETHILON 3-0 FS-10 30 BLK (SUTURE) ×3
SUT MNCRL 4-0 (SUTURE) ×2
SUT MNCRL 4-0 27XMFL (SUTURE) ×1
SUT VIC AB 3-0 SH 27 (SUTURE)
SUT VIC AB 3-0 SH 27X BRD (SUTURE) IMPLANT
SUT VICRYL 0 AB UR-6 (SUTURE) ×6 IMPLANT
SUTURE EHLN 3-0 FS-10 30 BLK (SUTURE) ×1 IMPLANT
SUTURE MNCRL 4-0 27XMF (SUTURE) ×1 IMPLANT
SYR 20CC LL (SYRINGE) ×3 IMPLANT
TOWEL OR 17X26 4PK STRL BLUE (TOWEL DISPOSABLE) ×3 IMPLANT
TROCAR XCEL BLUNT TIP 100MML (ENDOMECHANICALS) ×6 IMPLANT
TROCAR XCEL NON-BLD 5MMX100MML (ENDOMECHANICALS) ×3 IMPLANT
WATER STERILE IRR 1000ML POUR (IV SOLUTION) ×3 IMPLANT

## 2018-06-17 NOTE — ED Notes (Signed)
Surgeon is at the bedside 

## 2018-06-17 NOTE — ED Triage Notes (Signed)
Pt brought in via ems from home with right lower quad abd pain.  No n/v  Pt alert.  Pt seen in er earlier today .

## 2018-06-17 NOTE — H&P (Signed)
Subjective:   CC: acute cholecystitis  HPI:  Laurie Harris is a 77 y.o. female who is consulted by [facility] for evaluation of above cc.  Symptoms were first noted 1 day ago. Pain initially started in the left upper quadrant but then migrated to the right side.   Associated with nausea, exacerbated by moving around.     Past Medical History:  has a past medical history of Carotid arterial disease ([place]), Chicken pox, CKD (chronic kidney disease), stage III ([place]), Diabetes mellitus without complication ([place]), Hemorrhagic stroke ([place]) (12/31/2014), Hyperlipidemia, Hypertension, and Syncope.  Past Surgical History:  has a past surgical history that includes Neck surgery; Abscess drainage (06/14/2016); Appendectomy (06/13/2016); Colonoscopy; Cataract extraction w/PHACO (Left, 09/04/2017); and Cataract extraction w/PHACO (Right, 10/02/2017).  Family History: family history includes Cancer in her father and mother; Dementia in her mother; Diabetes in her mother; Heart disease in her mother; Hyperlipidemia in her daughter; Stroke in her daughter.  Social History:  reports that she has never smoked. She has never used smokeless tobacco. She reports that she does not drink alcohol or use drugs.  Current Medications: reviewed and includes aspirin  Allergies:  Allergies as of 06/16/2018 - Review Complete 06/16/2018  Allergen Reaction Noted  . Warfarin and related  07/01/2017  hx of stroke while on it  ROS:  A 15 point review of systems was performed and pertinent positives and negatives noted in HPI    Objective:     BP (!) 161/71   Pulse 85   Temp 98.6 F (37 C) (Oral)   Resp 19   Ht 5\' 6"  (1.676 m)   Wt 56.7 kg   SpO2 96%   BMI 20.18 kg/m    Constitutional :  alert, cooperative, appears stated age and no distress  Lymphatics/Throat:  no asymmetry, masses, or scars  Respiratory:  clear to auscultation bilaterally  Cardiovascular:  regular rate and rhythm  Gastrointestinal: Soft no  guarding but tenderness noted along the right upper quadrant extending to the mid abdomen..   Musculoskeletal: Steady gait and movement  Skin: Cool and moist  Psychiatric: Normal affect, non-agitated, not confused       LABS:  CMP Latest Ref Rng & Units 06/17/2018 06/16/2018 04/04/2018  Glucose 70 - 99 mg/dL 251(H) 209(H) 102(H)  BUN 8 - 23 mg/dL 20 25(H) 32(H)  Creatinine 0.44 - 1.00 mg/dL 0.96 1.05(H) 1.25(H)  Sodium 135 - 145 mmol/L 135 138 141  Potassium 3.5 - 5.1 mmol/L 4.3 3.8 4.4  Chloride 98 - 111 mmol/L 102 108 108  CO2 22 - 32 mmol/L 24 24 26   Calcium 8.9 - 10.3 mg/dL 9.1 9.2 10.3  Total Protein 6.5 - 8.1 g/dL 7.2 6.7 -  Total Bilirubin 0.3 - 1.2 mg/dL 1.1 0.6 -  Alkaline Phos 38 - 126 U/L 59 72 -  AST 15 - 41 U/L 39 33 -  ALT 0 - 44 U/L 30 29 -   CBC Latest Ref Rng & Units 06/17/2018 06/16/2018 02/07/2018  WBC 4.0 - 10.5 K/uL 15.6(H) 6.5 6.3  Hemoglobin 12.0 - 15.0 g/dL 13.5 12.7 12.6  Hematocrit 36.0 - 46.0 % 38.8 36.3 36.8  Platelets 150 - 400 K/uL 208 205 190     RADS: CLINICAL DATA:  77 y/o  F; right lower quadrant abdominal pain.  EXAM: CT ABDOMEN AND PELVIS WITH CONTRAST  TECHNIQUE: Multidetector CT imaging of the abdomen and pelvis was performed using the standard protocol following bolus administration of intravenous contrast.  CONTRAST:  75 cc Omnipaque 300  COMPARISON:  06/28/2017 CT abdomen and pelvis.  FINDINGS: Lower chest: No acute abnormality.  Hepatobiliary: No focal liver lesion. No significant intra or extrahepatic biliary ductal dilatation. The gallbladder is distended with pericholecystic fluid, trace perihepatic ascites near the gallbladder fossa, and there is cholelithiasis.  Pancreas: Unremarkable. No pancreatic ductal dilatation or surrounding inflammatory changes.  Spleen: Normal in size without focal abnormality.  Adrenals/Urinary Tract: Adrenal glands are unremarkable. Kidneys are normal, without renal calculi,  focal lesion, or hydronephrosis. Bladder is unremarkable.  Stomach/Bowel: Stomach is within normal limits. Appendix appears normal. No evidence of bowel wall thickening, distention, or inflammatory changes. Extensive pan colonic diverticulosis, no findings of acute diverticulitis.  Vascular/Lymphatic: Extensive coronary artery and aortic calcific atherosclerosis. No aneurysm or dissection.  Reproductive: Uterus and bilateral adnexa are unremarkable.  Other: No abdominal wall hernia.  Musculoskeletal: Moderate dextrocurvature of the lumbar spine as well as multilevel disc and facet degenerative changes. Bones are demineralized. No acute osseous abnormality is evident.  IMPRESSION: 1. Distended gallbladder with pericholecystic fluid, trace perihepatic ascites, and cholelithiasis. Findings are suspicious for acute cholecystitis. 2. Normal appendix. 3. Colonic diverticulosis without findings of acute diverticulitis.   Electronically Signed   By: Kristine Garbe M.D.   On: 06/17/2018 01:30  CLINICAL DATA:  Right-sided abdominal pain  EXAM: ULTRASOUND ABDOMEN LIMITED RIGHT UPPER QUADRANT  COMPARISON:  CT 06/17/2018  FINDINGS: Gallbladder:  Dilated gallbladder with multiple small stones and sludge. Stones measure up to 3.7 mm. Increased wall thickness up to 8.9 mm with trace pericholecystic fluid. Positive sonographic Murphy.  Common bile duct:  Diameter: 4.1 mm  Liver:  No focal lesion identified. Within normal limits in parenchymal echogenicity. Portal vein is patent on color Doppler imaging with normal direction of blood flow towards the liver.  IMPRESSION: 1. Dilated gallbladder with multiple stones and sludge, increased wall thickness, pericholecystic fluid and positive sonographic Murphy; constellation of findings would be consistent with acute cholecystitis. 2. There is no evidence for biliary dilatation   Electronically  Signed   By: Donavan Foil M.D.   On: 06/17/2018 03:09   Assessment:      Acute cholecystitis Insulin-dependent diabetes  History of carotid stenosis  Plan:      Discussed the risk of surgery including post-op infxn, seroma, biloma, chronic pain, poor-delayed wound healing, retained gallstone, conversion to open procedure, post-op SBO or ileus, and need for additional procedures to address said risks.  The risks of general anesthetic including MI, CVA, sudden death or even reaction to anesthetic medications also discussed. Alternatives include continued observation.  Benefits include possible symptom relief, prevention of complications including acute cholecystitis, pancreatitis.  Typical post operative recovery of 3-5 days rest, continued pain in area and incision sites, possible loose stools up to 4-6 weeks, also discussed.  The patient understands the risks, any and all questions were answered to the patient's satisfaction.  Will admit and continue Zosyn that was started in the ED, IV fluids, sliding scale insulin.  We did discuss how she is at an increased risk of perioperative complication due to her to age and diabetes as well as a history of stroke.  She did states she has a official documents dictation stating she is a DNR status and her power of attorney is her husband who is at bedside.

## 2018-06-17 NOTE — ED Notes (Signed)
Pt was seen in er earlier today with chest pain.  Now pt has rlq pain.  No n/v/d.  No back pain.  No urinary sx.  Sx began at 1900 tonight.  Pt alert  Skin warm and dry.

## 2018-06-17 NOTE — ED Notes (Signed)
ED Provider at bedside. 

## 2018-06-17 NOTE — Op Note (Signed)
Preoperative diagnosis:  acute and cholecystitis  Postoperative diagnosis: same as above  Procedure: Laparoscopic Cholecystectomy.   Anesthesia: GETA   Surgeon: Benjamine Sprague  Specimen: Gallbladder  Complications: None  EBL: 13mL  Wound Classification: Clean Contaminated  Indications: see HPI  Findings: Critical view of safety noted Cystic duct and artery identified, ligated and divided, clips remained intact at end of procedure Adequate hemostasis  Description of procedure: The patient was placed on the operating table in the supine position. SCDs placed, pre-op abx administered.  General anesthesia was induced and OG tube placed by anesthesia. A time-out was completed verifying correct patient, procedure, site, positioning, and implant(s) and/or special equipment prior to beginning this procedure. The abdomen was prepped and draped in the usual sterile fashion.  An incision was made in a natural skin line under the umbilicus.  Dissection carried down to fascia where two 0 vicryl sutures placed to use as anchor sutures for hasson port.  Incision made into fascia and blunt dissection used to enter peritoneum.  Hasson port placed and insufflation started up to 21mm Hg without any dramatic increase in pressure.    The laparoscope was inserted and the abdomen inspected. No injuries from initial trocar placement were noted. Additional trocars were then inserted under direct visualization in the following locations: a 5-mm trocar in the subxyphoid region and two 5-mm trocars along the right costal margin. The abdomen was inspected and no abnormalities or injuries were found. The table was placed in the reverse Trendelenburg position with the right side up.  Filmy adhesions between the gallbladder and omentum, duodenum and transverse colon were lysed sharply. The dome of the gallbladder was grasped with an atraumatic grasper passed through the lateral port and retracted over the dome of the  liver. The infundibulum was also grasped with an atraumatic grasper and retracted toward the right lower quadrant. This maneuver exposed Calot's triangle area but extensive adhesions noted covering the gangernous gallbladder. The peritoneum overlying the gallbladder infundibulum was then meticulously dissected for apporximately one hour and the cystic duct and cystic artery identified.  Critical view of safety with the liver bed clearly visible behind the duct and artery with no additional structures noted.  Picture taken before the cystic duct and cystic artery clipped and divided close to the gallbladder.  The gallbladder was then dissected from its peritoneal and liver bed attachments by electrocautery. Hemostasis was checked and the gallbladder was removed using an endoscopic retrieval bag placed through the umbilical port. The gallbladder was passed off the table as a specimen. The gallbladder fossa was copiously irrigated with saline and any leaked bile was suctioned out, and hemostasis was obtained with additional electrocautery and arista application.  There was no evidence of bleeding from the gallbladder fossa or cystic artery or leakage of the bile from the cystic duct stump.   Dye to extensive dissection and bleeding from the operatin, a blake drain was placed through lateral port site and secured with 3-0 nylon to monitor.  Abdomen desufflated and secondary trocars were removed under direct vision. No bleeding was noted. The laparoscope was withdrawn and the umbilical trocar removed.  The fascia of the Hasson trocar site was closed with figure-of-eight 0 vicryl sutures.  3-0 vicryl used to close deep dermal layer at umbilical site.  All skin incisions then closed with subcuticular sutures of 4-0 monocryl and dressed with topical skin adhesive. The orogastric tube was removed and patient extubated. The patient tolerated the procedure well and was taken to  the postanesthesia care unit in stable  condition.  All sponge and instrument count correct at end of procedure.

## 2018-06-17 NOTE — Interval H&P Note (Deleted)
History and Physical Interval Note:  06/17/2018 2:56 PM  Laurie Harris  has presented today for surgery, with the diagnosis of N/A  The various methods of treatment have been discussed with the patient and family. After consideration of risks, benefits and other options for treatment, the patient has consented to  Procedure(s): LAPAROSCOPIC CHOLECYSTECTOMY (N/A) as a surgical intervention .  The patient's history has been reviewed, patient examined, no change in status, stable for surgery.  I have reviewed the patient's chart and labs.  Questions were answered to the patient's satisfaction.     Raymont Andreoni Lysle Pearl

## 2018-06-17 NOTE — Anesthesia Post-op Follow-up Note (Signed)
Anesthesia QCDR form completed.        

## 2018-06-17 NOTE — ED Provider Notes (Signed)
[facility] Emergency Department Provider Note  ____________________________________________   None    (approximate)  I have reviewed the triage vital signs and the nursing notes.   HISTORY  Chief Complaint Abdominal Pain  HPI Laurie Harris is a 77 y.o. female who presents to the emergency department for treatment and evaluation of right lower quadrant pain without any nausea, vomiting, or diarrhea.  She denies back pain or urinary symptoms.  Symptoms began at approximately 7 PM tonight.  She was here earlier today for evaluation of chest pain.  Serial troponins were negative and the patient chose to go home instead of stay for observation which was offered. Since discharge, she has taken 2 of the Tramadol with relief of pain. She denies chest pain at this time. She was able to eat a potato and a biscuit for supper this evening. No increase in abdominal pain after eating. She states that 2 years ago she had a ruptured appendix which required "a drain, but they did not remove the appendix." She has not had any fever that she is aware of. She has not attempted any alleviating measures with the exception of the tramadol.  Past Medical History:  Diagnosis Date  . Carotid arterial disease ([place])    a. 09/2016 Carotid U/S: R carotid bifurcation dzs of 50-69%, L carotid bifurcation dzs of <50%. Patent vertebral arteries w/ anegrade flow.  . Chicken pox   . CKD (chronic kidney disease), stage III (HCC)    functioning at 46%  . Diabetes mellitus without complication ([place])   . Hemorrhagic stroke ([place]) 12/31/2014   a. in the setting of warfarin therapy.  . Hyperlipidemia   . Hypertension   . Syncope    a. 09/2016 Echo: EF 55-60%, no rwma, Gr1 DD.    Patient Active Problem List   Diagnosis Date Noted  . Subconjunctival hemorrhage of left eye 04/29/2018  . Osteoporosis 12/26/2017  . Aorto-iliac atherosclerosis ([place]) 09/20/2017  . Renal cyst 09/20/2017  .  Macrocytosis without anemia 09/20/2017  . Abdominal pain 11/05/2016  . Syncope 09/10/2016  . Appendicitis 08/07/2016  . Carotid artery disease ([place]) 06/19/2016  . CKD (chronic kidney disease) stage 3, GFR 30-59 ml/min (HCC) 05/10/2016  . Diabetes mellitus type 2, controlled, with complications ([place]) 40/97/3532  . Benign essential HTN 03/16/2015  . Hyperlipidemia 03/16/2015  . Cerebellar hemorrhage ([place]) 03/16/2015  . Adenomatous polyp of colon 06/04/2014  . Carotid artery occlusion without infarction 08/18/2013    Past Surgical History:  Procedure Laterality Date  . ABCESS DRAINAGE  06/14/2016   appendix  . APPENDECTOMY  06/13/2016  . CATARACT EXTRACTION W/PHACO Left 09/04/2017   Procedure: CATARACT EXTRACTION PHACO AND INTRAOCULAR LENS PLACEMENT ([place]) COMPLICATED LEFT DIABETIC;  Surgeon: Leandrew Koyanagi, MD;  Location: [place];  Service: Ophthalmology;  Laterality: Left;  Diabetic - insulin  . CATARACT EXTRACTION W/PHACO Right 10/02/2017   Procedure: CATARACT EXTRACTION PHACO AND INTRAOCULAR LENS PLACEMENT ([place]) COMPLICATED  RIGHT DIABETIC;  Surgeon: Leandrew Koyanagi, MD;  Location: [place];  Service: Ophthalmology;  Laterality: Right;  Diabetic - insulin  . COLONOSCOPY    . NECK SURGERY      Prior to Admission medications   Medication Sig Start Date End Date Taking? Authorizing Provider  aspirin EC 81 MG tablet Take 81 mg by mouth daily.   Yes [provider]  atorvastatin (LIPITOR) 80 MG tablet TAKE 1 TABLET BY MOUTH DAILY 03/19/18  Yes [name], MD  benazepril (LOTENSIN)  5 MG tablet Take 2.5mg  (half a tablet) by mouth at night. 04/29/18  Yes [name], MD  Calcium Carbonate (CALCIUM 600 PO) Take 1 tablet by mouth daily.    Yes [provider]  calcium-vitamin D (OSCAL WITH D) 500-200 MG-UNIT tablet Take 1 tablet by mouth 2 (two) times daily.   Yes [provider]  cholecalciferol (VITAMIN D) 1000 units  tablet Take 2,000 Units by mouth daily.   Yes [provider]  fenofibrate 160 MG tablet TAKE 1 TABLET BY MOUTH DAILY 03/24/18  Yes [name], MD  insulin lispro protamine-lispro (HUMALOG 75/25 MIX) (75-25) 100 UNIT/ML SUSP injection Inject into the skin. Inject 19 units in the morning and 18 at night   Yes [provider]  Multiple Vitamin (MULTIVITAMIN WITH MINERALS) TABS tablet Take 1 tablet by mouth daily.   Yes [provider]  Omega-3 Fatty Acids (FISH OIL) 1200 MG CAPS Take 1,200 mg by mouth daily.   Yes [provider]  pantoprazole (PROTONIX) 40 MG tablet TAKE ONE TABLET BY MOUTH EVERY DAY 04/15/18  Yes [name], MD  traMADol (ULTRAM) 50 MG tablet Take 1 tablet (50 mg total) by mouth every 6 (six) hours as needed. 06/16/18  Yes Harvest Dark, MD  NOVOFINE PLUS 32G X 4 MM MISC USE TWICE A DAY AS DIRECTED 02/25/18   [name], MD    Allergies Warfarin and related  Family History  Problem Relation Age of Onset  . Diabetes Mother   . Dementia Mother   . Heart disease Mother   . Cancer Mother        lung  . Cancer Father        colon  . Stroke Daughter   . Hyperlipidemia Daughter   . Breast cancer Neg Hx     Social History Social History   Tobacco Use  . Smoking status: Never Smoker  . Smokeless tobacco: Never Used  Substance Use Topics  . Alcohol use: No    Alcohol/week: 0.0 standard drinks  . Drug use: No    Review of Systems  Constitutional: No fever/chills Eyes: No visual changes. ENT: No sore throat. Cardiovascular: Denies chest pain. Respiratory: Denies shortness of breath. Gastrointestinal: Positive for abdominal pain.  No nausea, no vomiting.  No diarrhea.  No constipation. Genitourinary: Negative for dysuria. Musculoskeletal: Negative for back pain. Skin: Negative for rash. Neurological: Negative for headaches, focal weakness or  numbness. ____________________________________________   PHYSICAL EXAM:  VITAL SIGNS: ED Triage Vitals  Enc Vitals Group     BP 06/17/18 0011 127/86     Pulse Rate 06/17/18 0011 95     Resp 06/17/18 0011 20     Temp 06/17/18 0011 98.6 F (37 C)     Temp Source 06/17/18 0011 Oral     SpO2 06/17/18 0011 95 %     Weight 06/17/18 0010 125 lb (56.7 kg)     Height 06/17/18 0010 5\' 6"  (1.676 m)     Head Circumference --      Peak Flow --      Pain Score --      Pain Loc --      Pain Edu? --      Excl. in [place]? --     Constitutional: Alert and oriented. Chronically ill appearing and in no acute distress. Eyes: Conjunctivae are normal. PERRL. EOMI. Head: Atraumatic. Nose: No congestion/rhinnorhea. Mouth/Throat: Mucous membranes are moist.  Oropharynx non-erythematous. Neck: No stridor.  Cardiovascular: Normal rate, regular rhythm. Grossly normal heart sounds.  Good peripheral circulation. Respiratory: Normal respiratory effort.  No retractions. Lungs CTAB. Gastrointestinal: Soft, rebound tenderness in the RLQ, tenderness in the RUQ with palpation. Bowel sounds present x 4 quadrants. No distention. No abdominal bruits. Musculoskeletal: No lower extremity tenderness nor edema.  No joint effusions. Neurologic:  Normal speech and language. No gross focal neurologic deficits are appreciated. No gait instability. Skin:  Skin is warm, dry and intact. No rash noted. Psychiatric: Mood and affect are normal. Speech and behavior are normal.  ____________________________________________   LABS (all labs ordered are listed, but only abnormal results are displayed)  Labs Reviewed  COMPREHENSIVE METABOLIC PANEL - Abnormal; Notable for the following components:      Result Value   Glucose, Bld 251 (*)    GFR calc non Af Amer 55 (*)    All other components within normal limits  CBC - Abnormal; Notable for the following components:   WBC 15.6 (*)    MCH 34.6 (*)    All other components  within normal limits  TROPONIN I - Abnormal; Notable for the following components:   Troponin I 0.03 (*)    All other components within normal limits  LIPASE, BLOOD  PROTIME-INR  APTT  LACTIC ACID, PLASMA  LACTIC ACID, PLASMA   ____________________________________________  EKG  ED ECG REPORT I, Sherrie George, the attending physician, personally viewed and interpreted this ECG.   Date: 06/17/2018  EKG Time: 0022  Rate: Sinus rhythm  Rhythm: unchanged from previous tracings  Axis: normal  Intervals:none  ST&T Change: no ST elevation  ____________________________________________  RADIOLOGY  Official radiology report(s): Dg Chest 2 View  Result Date: 06/16/2018 CLINICAL DATA:  Awoke at 0500 hours with LEFT upper chest pain, weakness, history of stroke, diabetes mellitus, hypertension, stage III chronic kidney disease EXAM: CHEST - 2 VIEW COMPARISON:  01/21/2017 FINDINGS: Normal heart size, mediastinal contours, and pulmonary vascularity. Chronic accentuation of RIGHT infrahilar markings appears unchanged. Mild central peribronchial thickening. No acute infiltrate, pleural effusion, or pneumothorax. Osseous structures unremarkable. IMPRESSION: Chronic bronchitic changes without acute infiltrate. Electronically Signed   By: Lavonia Dana M.D.   On: 06/16/2018 11:38   Ct Abdomen Pelvis W Contrast  Result Date: 06/17/2018 CLINICAL DATA:  77 y/o  F; right lower quadrant abdominal pain. EXAM: CT ABDOMEN AND PELVIS WITH CONTRAST TECHNIQUE: Multidetector CT imaging of the abdomen and pelvis was performed using the standard protocol following bolus administration of intravenous contrast. CONTRAST:  75 cc Omnipaque 300 COMPARISON:  06/28/2017 CT abdomen and pelvis. FINDINGS: Lower chest: No acute abnormality. Hepatobiliary: No focal liver lesion. No significant intra or extrahepatic biliary ductal dilatation. The gallbladder is distended with pericholecystic fluid, trace perihepatic ascites  near the gallbladder fossa, and there is cholelithiasis. Pancreas: Unremarkable. No pancreatic ductal dilatation or surrounding inflammatory changes. Spleen: Normal in size without focal abnormality. Adrenals/Urinary Tract: Adrenal glands are unremarkable. Kidneys are normal, without renal calculi, focal lesion, or hydronephrosis. Bladder is unremarkable. Stomach/Bowel: Stomach is within normal limits. Appendix appears normal. No evidence of bowel wall thickening, distention, or inflammatory changes. Extensive pan colonic diverticulosis, no findings of acute diverticulitis. Vascular/Lymphatic: Extensive coronary artery and aortic calcific atherosclerosis. No aneurysm or dissection. Reproductive: Uterus and bilateral adnexa are unremarkable. Other: No abdominal wall hernia. Musculoskeletal: Moderate dextrocurvature of the lumbar spine as well as multilevel disc and facet degenerative changes. Bones are demineralized. No acute osseous abnormality is evident. IMPRESSION: 1. Distended gallbladder with pericholecystic fluid, trace  perihepatic ascites, and cholelithiasis. Findings are suspicious for acute cholecystitis. 2. Normal appendix. 3. Colonic diverticulosis without findings of acute diverticulitis. Electronically Signed   By: Kristine Garbe M.D.   On: 06/17/2018 01:30   US Abdomen Limited Ruq  Result Date: 06/17/2018 CLINICAL DATA:  Right-sided abdominal pain EXAM: ULTRASOUND ABDOMEN LIMITED RIGHT UPPER QUADRANT COMPARISON:  CT 06/17/2018 FINDINGS: Gallbladder: Dilated gallbladder with multiple small stones and sludge. Stones measure up to 3.7 mm. Increased wall thickness up to 8.9 mm with trace pericholecystic fluid. Positive sonographic Murphy. Common bile duct: Diameter: 4.1 mm Liver: No focal lesion identified. Within normal limits in parenchymal echogenicity. Portal vein is patent on color Doppler imaging with normal direction of blood flow towards the liver. IMPRESSION: 1. Dilated gallbladder  with multiple stones and sludge, increased wall thickness, pericholecystic fluid and positive sonographic Murphy; constellation of findings would be consistent with acute cholecystitis. 2. There is no evidence for biliary dilatation Electronically Signed   By: Donavan Foil M.D.   On: 06/17/2018 03:09    ____________________________________________   PROCEDURES  Procedure(s) performed: None  Procedures  Critical Care performed: No  ____________________________________________   INITIAL IMPRESSION / ASSESSMENT AND PLAN / ED COURSE  As part of my medical decision making, I reviewed the following data within the electronic MEDICAL RECORD NUMBER Notes from prior ED visits  77 year old female presenting to the emergency department for treatment and evaluation of right lower quadrant abdominal pain. Pain started acutely at 7pm this evening. Differential diagnosis includes, but is not limited to, acute appendicitis, diverticulitis, urinary tract infection/pyelonephritis, endometriosis, bowel obstruction, colitis, renal colic, gastroenteritis, hernia, fibroids, endometriosis, etc. CT abdomen and pelvis with contrast has been ordered.   ----------------------------------------- 1:30 AM on 06/17/2018 -----------------------------------------  Lab results reviewed. She has an acute leukocytosis compared to labs from 06/16/18. Her troponin is now 0.03 compared to <0.03.  ----------------------------------------- 3:12 AM on 06/17/2018 -----------------------------------------  Results of the CT were discussed with the patient and family. US of the RUQ has been ordered and is in progress at this time. Care relinquished to C. Karma Greaser, MD who will follow up on results and form disposition.       ____________________________________________   FINAL CLINICAL IMPRESSION(S) / ED DIAGNOSES  Final diagnoses:  Abdominal pain, unspecified abdominal location     ED Discharge Orders    None        Note:  This document was prepared using Dragon voice recognition software and may include unintentional dictation errors.    Victorino Dike, FNP 06/17/18 [id number]    Hinda Kehr, MD 06/17/18 [phone]

## 2018-06-17 NOTE — Anesthesia Procedure Notes (Signed)
Procedure Name: Intubation Date/Time: 06/17/2018 3:22 PM Performed by: Jonna Clark, CRNA Pre-anesthesia Checklist: Patient identified, Patient being monitored, Timeout performed, Emergency Drugs available and Suction available Patient Re-evaluated:Patient Re-evaluated prior to induction Oxygen Delivery Method: Circle system utilized Preoxygenation: Pre-oxygenation with 100% oxygen Induction Type: IV induction Ventilation: Mask ventilation without difficulty Laryngoscope Size: Mac and 3 Grade View: Grade I Tube type: Oral Tube size: 7.0 mm Number of attempts: 1 Placement Confirmation: ETT inserted through vocal cords under direct vision,  positive ETCO2 and breath sounds checked- equal and bilateral Secured at: 21 cm Tube secured with: Tape Dental Injury: Teeth and Oropharynx as per pre-operative assessment

## 2018-06-17 NOTE — Anesthesia Postprocedure Evaluation (Signed)
Anesthesia Post Note  Patient: Laurie Harris  Procedure(s) Performed: LAPAROSCOPIC CHOLECYSTECTOMY (N/A )  Patient location during evaluation: PACU Anesthesia Type: General Level of consciousness: awake and alert Pain management: pain level controlled Vital Signs Assessment: post-procedure vital signs reviewed and stable Respiratory status: spontaneous breathing and respiratory function stable Cardiovascular status: stable Anesthetic complications: no     Last Vitals:  Vitals:   06/17/18 1904 06/17/18 1937  BP: (!) 173/78 (!) 175/79  Pulse: 94 89  Resp: 20 16  Temp: 36.6 C 36.4 C  SpO2: 91% 91%    Last Pain:  Vitals:   06/17/18 1937  TempSrc: Oral  PainSc:                  KEPHART,WILLIAM K

## 2018-06-17 NOTE — Interval H&P Note (Signed)
History and Physical Interval Note:  06/17/2018 2:56 PM  Laurie Harris  has presented today for surgery, with the diagnosis of N/A  The various methods of treatment have been discussed with the patient and family. After consideration of risks, benefits and other options for treatment, the patient has consented to  Procedure(s): LAPAROSCOPIC CHOLECYSTECTOMY (N/A) as a surgical intervention .  The patient's history has been reviewed, patient examined, no change in status, stable for surgery.  I have reviewed the patient's chart and labs.  Questions were answered to the patient's satisfaction.     [name] Lysle Pearl

## 2018-06-17 NOTE — ED Notes (Signed)
Pt alert.  Family with pt  nsr on monitor.  Iv in place.

## 2018-06-17 NOTE — Transfer of Care (Signed)
Immediate Anesthesia Transfer of Care Note  Patient: Laurie Harris  Procedure(s) Performed: LAPAROSCOPIC CHOLECYSTECTOMY (N/A )  Patient Location: PACU  Anesthesia Type:General  Level of Consciousness: drowsy  Airway & Oxygen Therapy: Patient connected to face mask oxygen  Post-op Assessment: Post -op Vital signs reviewed and stable  Post vital signs: stable  Last Vitals:  Vitals Value Taken Time  BP 138/46 06/17/2018  5:58 PM  Temp    Pulse 95 06/17/2018  5:59 PM  Resp 15 06/17/2018  5:59 PM  SpO2 100 % 06/17/2018  5:59 PM  Vitals shown include unvalidated device data.  Last Pain:  Vitals:   06/17/18 1328  TempSrc: Temporal  PainSc: 0-No pain         Complications: No apparent anesthesia complications

## 2018-06-17 NOTE — Care Management Obs Status (Signed)
MEDICARE OBSERVATION STATUS NOTIFICATION   Patient Details  Name: Laurie Harris MRN: [id number] Date of Birth: 1941-02-16   Medicare Observation Status Notification Given:  No(off the floor )    Beverly Sessions, RN 06/17/2018, 3:17 PM

## 2018-06-17 NOTE — ED Notes (Signed)
Report off to stephanie rn

## 2018-06-17 NOTE — Anesthesia Preprocedure Evaluation (Addendum)
Anesthesia Evaluation  Patient identified by MRN, date of birth, ID band Patient awake    Reviewed: Allergy & Precautions, NPO status , Patient's Chart, lab work & pertinent test results  History of Anesthesia Complications Negative for: history of anesthetic complications  Airway Mallampati: II  TM Distance: >3 FB Neck ROM: Full    Dental  (+) Implants   Pulmonary neg pulmonary ROS, neg sleep apnea, neg COPD,    breath sounds clear to auscultation- rhonchi (-) wheezing      Cardiovascular hypertension, Pt. on medications (-) CAD, (-) Past MI, (-) Cardiac Stents and (-) CABG  Rhythm:Regular Rate:Normal - Systolic murmurs and - Diastolic murmurs    Neuro/Psych negative neurological ROS  negative psych ROS   GI/Hepatic negative GI ROS, Neg liver ROS,   Endo/Other  diabetes, Insulin Dependent  Renal/GU Renal InsufficiencyRenal disease     Musculoskeletal negative musculoskeletal ROS (+)   Abdominal (+) - obese,   Peds  Hematology negative hematology ROS (+)   Anesthesia Other Findings Past Medical History: No date: Carotid arterial disease (HCC)     Comment:  a. 09/2016 Carotid U/S: R carotid bifurcation dzs of               50-69%, L carotid bifurcation dzs of <50%. Patent               vertebral arteries w/ anegrade flow. No date: Chicken pox No date: CKD (chronic kidney disease), stage III (HCC)     Comment:  functioning at 46% No date: Diabetes mellitus without complication ([place]) 67/59/1638: Hemorrhagic stroke ([place])     Comment:  a. in the setting of warfarin therapy. No date: Hyperlipidemia No date: Hypertension No date: Syncope     Comment:  a. 09/2016 Echo: EF 55-60%, no rwma, Gr1 DD.   Reproductive/Obstetrics                             Anesthesia Physical Anesthesia Plan  ASA: II  Anesthesia Plan: General   Post-op Pain Management:    Induction: Intravenous  PONV Risk  Score and Plan: 2 and Ondansetron, Dexamethasone and Midazolam  Airway Management Planned: Oral ETT  Additional Equipment:   Intra-op Plan:   Post-operative Plan: Extubation in OR  Informed Consent: I have reviewed the patients History and Physical, chart, labs and discussed the procedure including the risks, benefits and alternatives for the proposed anesthesia with the patient or authorized representative who has indicated his/her understanding and acceptance.   Dental advisory given  Plan Discussed with: CRNA and Anesthesiologist  Anesthesia Plan Comments: (Pt has DNR order, we discussed plan for suspension of DNR order during periop period due to possible need for respiratory support, BP support and potential reactions to medications during the procedure. )       Anesthesia Quick Evaluation

## 2018-06-18 ENCOUNTER — Ambulatory Visit: Payer: Medicare Other

## 2018-06-18 ENCOUNTER — Encounter: Payer: Self-pay | Admitting: Surgery

## 2018-06-18 LAB — BASIC METABOLIC PANEL
Anion gap: 7 (ref 5–15)
BUN: 18 mg/dL (ref 8–23)
CO2: 20 mmol/L — ABNORMAL LOW (ref 22–32)
CREATININE: 1.25 mg/dL — AB (ref 0.44–1.00)
Calcium: 7.9 mg/dL — ABNORMAL LOW (ref 8.9–10.3)
Chloride: 113 mmol/L — ABNORMAL HIGH (ref 98–111)
GFR calc Af Amer: 46 mL/min — ABNORMAL LOW (ref >60)
GFR, EST NON AFRICAN AMERICAN: 40 mL/min — AB (ref >60)
Glucose, Bld: 167 mg/dL — ABNORMAL HIGH (ref 70–99)
Potassium: 3.8 mmol/L (ref 3.5–5.1)
SODIUM: 140 mmol/L (ref 135–145)

## 2018-06-18 LAB — GLUCOSE, CAPILLARY
GLUCOSE-CAPILLARY: 127 mg/dL — AB (ref 70–99)
GLUCOSE-CAPILLARY: 212 mg/dL — AB (ref 70–99)
Glucose-Capillary: 143 mg/dL — ABNORMAL HIGH (ref 70–99)
Glucose-Capillary: 206 mg/dL — ABNORMAL HIGH (ref 70–99)

## 2018-06-18 LAB — CBC WITH DIFFERENTIAL/PLATELET
Abs Immature Granulocytes: 0.03 10*3/uL (ref 0.00–0.07)
BASOS PCT: 0 %
Basophils Absolute: 0 10*3/uL (ref 0.0–0.1)
Eosinophils Absolute: 0 10*3/uL (ref 0.0–0.5)
Eosinophils Relative: 0 %
HEMATOCRIT: 32.5 % — AB (ref 36.0–46.0)
Hemoglobin: 10.8 g/dL — ABNORMAL LOW (ref 12.0–15.0)
IMMATURE GRANULOCYTES: 0 %
Lymphocytes Relative: 18 %
Lymphs Abs: 1.9 10*3/uL (ref 0.7–4.0)
MCH: 34 pg (ref 26.0–34.0)
MCHC: 33.2 g/dL (ref 30.0–36.0)
MCV: 102.2 fL — AB (ref 80.0–100.0)
MONOS PCT: 6 %
Monocytes Absolute: 0.6 10*3/uL (ref 0.1–1.0)
NEUTROS PCT: 76 %
Neutro Abs: 7.7 10*3/uL (ref 1.7–7.7)
PLATELETS: 164 10*3/uL (ref 150–400)
RBC: 3.18 MIL/uL — ABNORMAL LOW (ref 3.87–5.11)
RDW: 13.6 % (ref 11.5–15.5)
WBC: 10.2 10*3/uL (ref 4.0–10.5)
nRBC: 0 % (ref 0.0–0.2)

## 2018-06-18 LAB — HEPATIC FUNCTION PANEL
ALT: 55 U/L — AB (ref 0–44)
AST: 67 U/L — ABNORMAL HIGH (ref 15–41)
Albumin: 3 g/dL — ABNORMAL LOW (ref 3.5–5.0)
Alkaline Phosphatase: 39 U/L (ref 38–126)
BILIRUBIN DIRECT: 0.3 mg/dL — AB (ref 0.0–0.2)
BILIRUBIN INDIRECT: 0.6 mg/dL (ref 0.3–0.9)
BILIRUBIN TOTAL: 0.9 mg/dL (ref 0.3–1.2)
Total Protein: 5.8 g/dL — ABNORMAL LOW (ref 6.5–8.1)

## 2018-06-18 LAB — CBC
HCT: 33.5 % — ABNORMAL LOW (ref 36.0–46.0)
Hemoglobin: 11.1 g/dL — ABNORMAL LOW (ref 12.0–15.0)
MCH: 33 pg (ref 26.0–34.0)
MCHC: 33.1 g/dL (ref 30.0–36.0)
MCV: 99.7 fL (ref 80.0–100.0)
PLATELETS: 184 10*3/uL (ref 150–400)
RBC: 3.36 MIL/uL — ABNORMAL LOW (ref 3.87–5.11)
RDW: 13.5 % (ref 11.5–15.5)
WBC: 12.1 10*3/uL — ABNORMAL HIGH (ref 4.0–10.5)
nRBC: 0 % (ref 0.0–0.2)

## 2018-06-18 LAB — PHOSPHORUS: Phosphorus: 3.4 mg/dL (ref 2.5–4.6)

## 2018-06-18 LAB — MAGNESIUM: MAGNESIUM: 1.8 mg/dL (ref 1.7–2.4)

## 2018-06-18 MED ORDER — BENAZEPRIL HCL 5 MG PO TABS
2.5000 mg | ORAL_TABLET | Freq: Every day | ORAL | Status: DC
  Administered 2018-06-18: 2.5 mg via ORAL
  Filled 2018-06-18 (×2): qty 1

## 2018-06-18 NOTE — Progress Notes (Signed)
Subjective:  CC:  Laurie Harris is a 77 y.o. female  Hospital stay day 0, 1 Day Post-Op lap chole  HPI: States she feels "off" today, and reports tenderness at her drain site.  Otherwise no specific complaints.  ROS:  A 5 point review of systems was performed and pertinent positives and negatives noted in HPI.   Objective:      Temp:  [97.2 F (36.2 C)-99.4 F (37.4 C)] 98 F (36.7 C) (10/16 0752) Pulse Rate:  [76-98] 85 (10/16 0752) Resp:  [15-20] 18 (10/16 0452) BP: (113-175)/(46-82) 151/63 (10/16 0752) SpO2:  [91 %-100 %] 98 % (10/16 0752)     Height: 5\' 6"  (167.6 cm) Weight: 56.7 kg BMI (Calculated): 20.19   Intake/Output this shift:  No intake/output data recorded.       Constitutional :  alert, cooperative, appears stated age and no distress  Respiratory:  clear to auscultation bilaterally  Cardiovascular:  regular rate and rhythm  Gastrointestinal: soft, no guarding, focal tenderness around drain site in RUQ.  incisions c/d/i.Marland Kitchen   Skin: Cool and moist.   Psychiatric: Normal affect, non-agitated, not confused       LABS:  CMP Latest Ref Rng & Units 06/18/2018 06/17/2018 06/16/2018  Glucose 70 - 99 mg/dL 167(H) 251(H) 209(H)  BUN 8 - 23 mg/dL 18 20 25(H)  Creatinine 0.44 - 1.00 mg/dL 1.25(H) 0.96 1.05(H)  Sodium 135 - 145 mmol/L 140 135 138  Potassium 3.5 - 5.1 mmol/L 3.8 4.3 3.8  Chloride 98 - 111 mmol/L 113(H) 102 108  CO2 22 - 32 mmol/L 20(L) 24 24  Calcium 8.9 - 10.3 mg/dL 7.9(L) 9.1 9.2  Total Protein 6.5 - 8.1 g/dL 5.8(L) 7.2 6.7  Total Bilirubin 0.3 - 1.2 mg/dL 0.9 1.1 0.6  Alkaline Phos 38 - 126 U/L 39 59 72  AST 15 - 41 U/L 67(H) 39 33  ALT 0 - 44 U/L 55(H) 30 29   CBC Latest Ref Rng & Units 06/18/2018 06/17/2018 06/16/2018  WBC 4.0 - 10.5 K/uL 12.1(H) 15.6(H) 6.5  Hemoglobin 12.0 - 15.0 g/dL 11.1(L) 13.5 12.7  Hematocrit 36.0 - 46.0 % 33.5(L) 38.8 36.3  Platelets 150 - 400 K/uL 184 208 205    RADS: n/a Assessment:   S/p lap chole.   Will repeat hgb this pm to ensure no continuing drop.  JP output is serosanguinous and amount is not concerning at this time.  If feeling better and tolerating diet, with pain controlled with PO meds, could potentially d/c home later this pm/early evening

## 2018-06-18 NOTE — Care Management Obs Status (Signed)
[place] NOTIFICATION   Patient Details  Name: Laurie Harris MRN: [id number] Date of Birth: 1941/08/03   Medicare Observation Status Notification Given:  Yes    Beverly Sessions, RN 06/18/2018, 1:36 PM

## 2018-06-19 DIAGNOSIS — Z8249 Family history of ischemic heart disease and other diseases of the circulatory system: Secondary | ICD-10-CM | POA: Diagnosis not present

## 2018-06-19 DIAGNOSIS — K5730 Diverticulosis of large intestine without perforation or abscess without bleeding: Secondary | ICD-10-CM | POA: Diagnosis present

## 2018-06-19 DIAGNOSIS — Z818 Family history of other mental and behavioral disorders: Secondary | ICD-10-CM | POA: Diagnosis not present

## 2018-06-19 DIAGNOSIS — R079 Chest pain, unspecified: Secondary | ICD-10-CM | POA: Diagnosis not present

## 2018-06-19 DIAGNOSIS — N183 Chronic kidney disease, stage 3 (moderate): Secondary | ICD-10-CM | POA: Diagnosis present

## 2018-06-19 DIAGNOSIS — I129 Hypertensive chronic kidney disease with stage 1 through stage 4 chronic kidney disease, or unspecified chronic kidney disease: Secondary | ICD-10-CM | POA: Diagnosis present

## 2018-06-19 DIAGNOSIS — Z66 Do not resuscitate: Secondary | ICD-10-CM | POA: Diagnosis present

## 2018-06-19 DIAGNOSIS — Z794 Long term (current) use of insulin: Secondary | ICD-10-CM | POA: Diagnosis not present

## 2018-06-19 DIAGNOSIS — E1122 Type 2 diabetes mellitus with diabetic chronic kidney disease: Secondary | ICD-10-CM | POA: Diagnosis present

## 2018-06-19 DIAGNOSIS — K8000 Calculus of gallbladder with acute cholecystitis without obstruction: Secondary | ICD-10-CM | POA: Diagnosis present

## 2018-06-19 DIAGNOSIS — Z9842 Cataract extraction status, left eye: Secondary | ICD-10-CM | POA: Diagnosis not present

## 2018-06-19 DIAGNOSIS — Z961 Presence of intraocular lens: Secondary | ICD-10-CM | POA: Diagnosis present

## 2018-06-19 DIAGNOSIS — Z8673 Personal history of transient ischemic attack (TIA), and cerebral infarction without residual deficits: Secondary | ICD-10-CM | POA: Diagnosis not present

## 2018-06-19 DIAGNOSIS — Z9841 Cataract extraction status, right eye: Secondary | ICD-10-CM | POA: Diagnosis not present

## 2018-06-19 DIAGNOSIS — Z888 Allergy status to other drugs, medicaments and biological substances status: Secondary | ICD-10-CM | POA: Diagnosis not present

## 2018-06-19 DIAGNOSIS — Z79899 Other long term (current) drug therapy: Secondary | ICD-10-CM | POA: Diagnosis not present

## 2018-06-19 DIAGNOSIS — Z9049 Acquired absence of other specified parts of digestive tract: Secondary | ICD-10-CM | POA: Diagnosis not present

## 2018-06-19 DIAGNOSIS — E785 Hyperlipidemia, unspecified: Secondary | ICD-10-CM | POA: Diagnosis present

## 2018-06-19 DIAGNOSIS — Z7982 Long term (current) use of aspirin: Secondary | ICD-10-CM | POA: Diagnosis not present

## 2018-06-19 LAB — CBC
HCT: 33.1 % — ABNORMAL LOW (ref 36.0–46.0)
Hemoglobin: 11 g/dL — ABNORMAL LOW (ref 12.0–15.0)
MCH: 33.6 pg (ref 26.0–34.0)
MCHC: 33.2 g/dL (ref 30.0–36.0)
MCV: 101.2 fL — ABNORMAL HIGH (ref 80.0–100.0)
Platelets: 192 10*3/uL (ref 150–400)
RBC: 3.27 MIL/uL — ABNORMAL LOW (ref 3.87–5.11)
RDW: 13.2 % (ref 11.5–15.5)
WBC: 8.3 10*3/uL (ref 4.0–10.5)
nRBC: 0 % (ref 0.0–0.2)

## 2018-06-19 LAB — BASIC METABOLIC PANEL
ANION GAP: 8 (ref 5–15)
BUN: 28 mg/dL — ABNORMAL HIGH (ref 8–23)
CO2: 21 mmol/L — ABNORMAL LOW (ref 22–32)
CREATININE: 1.05 mg/dL — AB (ref 0.44–1.00)
Calcium: 8.5 mg/dL — ABNORMAL LOW (ref 8.9–10.3)
Chloride: 113 mmol/L — ABNORMAL HIGH (ref 98–111)
GFR, EST AFRICAN AMERICAN: 57 mL/min — AB (ref >60)
GFR, EST NON AFRICAN AMERICAN: 49 mL/min — AB (ref >60)
Glucose, Bld: 156 mg/dL — ABNORMAL HIGH (ref 70–99)
Potassium: 3.9 mmol/L (ref 3.5–5.1)
SODIUM: 142 mmol/L (ref 135–145)

## 2018-06-19 LAB — GLUCOSE, CAPILLARY: GLUCOSE-CAPILLARY: 136 mg/dL — AB (ref 70–99)

## 2018-06-19 LAB — SURGICAL PATHOLOGY

## 2018-06-19 LAB — MAGNESIUM: MAGNESIUM: 2 mg/dL (ref 1.7–2.4)

## 2018-06-19 LAB — PHOSPHORUS: PHOSPHORUS: 1.7 mg/dL — AB (ref 2.5–4.6)

## 2018-06-19 MED ORDER — DOCUSATE SODIUM 100 MG PO CAPS: 100.0000 mg | ORAL_CAPSULE | Freq: Two times a day (BID) | ORAL | 0 refills | Status: DC | PRN

## 2018-06-19 MED ORDER — IBUPROFEN 800 MG PO TABS: 800.0000 mg | ORAL_TABLET | Freq: Three times a day (TID) | ORAL | 0 refills | Status: DC | PRN

## 2018-06-19 MED ORDER — ACETAMINOPHEN 325 MG PO TABS: 650.0000 mg | ORAL_TABLET | Freq: Three times a day (TID) | ORAL | 0 refills | Status: DC | PRN

## 2018-06-19 NOTE — Progress Notes (Signed)
Nsg Discharge Note  Admit Date:  06/16/2018 Discharge date: 06/19/2018   Laurie Harris to be D/C'd Home per MD order.  AVS completed.  Copy for chart, and copy for patient signed, and dated. Patient/caregiver able to verbalize understanding.  Discharge Medication: Allergies as of 06/19/2018      Reactions   Warfarin And Related    Possible stroke      Medication List    TAKE these medications   acetaminophen 325 MG tablet Commonly known as:  TYLENOL Take 2 tablets (650 mg total) by mouth every 8 (eight) hours as needed for mild pain.   aspirin EC 81 MG tablet Take 81 mg by mouth daily.   atorvastatin 80 MG tablet Commonly known as:  LIPITOR TAKE 1 TABLET BY MOUTH DAILY   benazepril 5 MG tablet Commonly known as:  LOTENSIN Take 2.5mg  (half a tablet) by mouth at night.   CALCIUM 600 PO Take 1 tablet by mouth daily.   calcium-vitamin D 500-200 MG-UNIT tablet Commonly known as:  OSCAL WITH D Take 1 tablet by mouth 2 (two) times daily.   cholecalciferol 1000 units tablet Commonly known as:  VITAMIN D Take 2,000 Units by mouth daily.   docusate sodium 100 MG capsule Commonly known as:  COLACE Take 1 capsule (100 mg total) by mouth 2 (two) times daily as needed for mild constipation.   fenofibrate 160 MG tablet TAKE 1 TABLET BY MOUTH DAILY   Fish Oil 1200 MG Caps Take 1,200 mg by mouth daily.   ibuprofen 800 MG tablet Commonly known as:  ADVIL,MOTRIN Take 1 tablet (800 mg total) by mouth every 8 (eight) hours as needed for mild pain or moderate pain.   insulin lispro protamine-lispro (75-25) 100 UNIT/ML Susp injection Commonly known as:  HUMALOG 75/25 MIX Inject into the skin. Inject 19 units in the morning and 18 at night   multivitamin with minerals Tabs tablet Take 1 tablet by mouth daily.   NOVOFINE PLUS 32G X 4 MM Misc Generic drug:  Insulin Pen Needle USE TWICE A DAY AS DIRECTED   pantoprazole 40 MG tablet Commonly known as:  PROTONIX TAKE ONE  TABLET BY MOUTH EVERY DAY   traMADol 50 MG tablet Commonly known as:  ULTRAM Take 1 tablet (50 mg total) by mouth every 6 (six) hours as needed.       Discharge Assessment: Vitals:   06/19/18 0003 06/19/18 0609  BP: (!) 157/66 (!) 161/70  Pulse: 83 83  Resp: 18 16  Temp:  (!) 97.5 F (36.4 C)  SpO2: 93% 95%   Skin clean, dry and intact without evidence of skin break down, no evidence of skin tears noted. IV catheter discontinued intact. Site without signs and symptoms of complications - no redness or edema noted at insertion site, patient denies c/o pain - only slight tenderness at site.  Dressing with slight pressure applied.  D/c Instructions-Education: Discharge instructions given to patient/family with verbalized understanding. D/c education completed with patient/family including follow up instructions, medication list, d/c activities limitations if indicated, with other d/c instructions as indicated by MD - patient able to verbalize understanding, all questions fully answered. Patient instructed to return to ED, call 911, or call MD for any changes in condition.  Patient escorted via [place], and D/C home via private auto.  Eda Keys, RN 06/19/2018 11:39 AM

## 2018-06-19 NOTE — Progress Notes (Signed)
Pt and family stated they wanted to wait and eat lunch. Provided d/c paperwork. IV removed. Questions answered. Denies further questions. Educated pt and family to notify staff when they are ready to leave. Both verbalizes understanding.

## 2018-06-19 NOTE — Discharge Summary (Signed)
Physician Discharge Summary  Patient ID: Laurie Harris MRN: [id number] DOB/AGE: [birth year] 79 y.o.  Admit date: 06/16/2018 Discharge date: 06/19/2018  Admission Diagnoses: acute cholecystitis  Discharge Diagnoses:  Same as above  Discharged Condition: good  Hospital Course: dx with acute cholecystitis, underwent lap chole. Please see op note for details.  Recovered well.  JP drain pulled prior to d/c.   Consults: None  Discharge Exam: Blood pressure (!) 161/70, pulse 83, temperature (!) 97.5 F (36.4 C), temperature source Oral, resp. rate 16, height 5\' 6"  (1.676 m), weight 56.7 kg, SpO2 95 %. General appearance: alert, cooperative and no distress GI: soft, non-tender; bowel sounds normal; no masses,  no organomegaly and incisions c/d/i.  JP site covered with dressing after removing at bedside  Disposition:  Discharge disposition: 01-Home or Self Care       Discharge Instructions    Discharge patient   Complete by:  As directed    Discharge disposition:  01-Home or Self Care   Discharge patient date:  06/19/2018     Allergies as of 06/19/2018      Reactions   Warfarin And Related    Possible stroke      Medication List    TAKE these medications   acetaminophen 325 MG tablet Commonly known as:  TYLENOL Take 2 tablets (650 mg total) by mouth every 8 (eight) hours as needed for mild pain.   aspirin EC 81 MG tablet Take 81 mg by mouth daily.   atorvastatin 80 MG tablet Commonly known as:  LIPITOR TAKE 1 TABLET BY MOUTH DAILY   benazepril 5 MG tablet Commonly known as:  LOTENSIN Take 2.5mg  (half a tablet) by mouth at night.   CALCIUM 600 PO Take 1 tablet by mouth daily.   calcium-vitamin D 500-200 MG-UNIT tablet Commonly known as:  OSCAL WITH D Take 1 tablet by mouth 2 (two) times daily.   cholecalciferol 1000 units tablet Commonly known as:  VITAMIN D Take 2,000 Units by mouth daily.   docusate sodium 100 MG capsule Commonly known as:   COLACE Take 1 capsule (100 mg total) by mouth 2 (two) times daily as needed for mild constipation.   fenofibrate 160 MG tablet TAKE 1 TABLET BY MOUTH DAILY   Fish Oil 1200 MG Caps Take 1,200 mg by mouth daily.   ibuprofen 800 MG tablet Commonly known as:  ADVIL,MOTRIN Take 1 tablet (800 mg total) by mouth every 8 (eight) hours as needed for mild pain or moderate pain.   insulin lispro protamine-lispro (75-25) 100 UNIT/ML Susp injection Commonly known as:  HUMALOG 75/25 MIX Inject into the skin. Inject 19 units in the morning and 18 at night   multivitamin with minerals Tabs tablet Take 1 tablet by mouth daily.   NOVOFINE PLUS 32G X 4 MM Misc Generic drug:  Insulin Pen Needle USE TWICE A DAY AS DIRECTED   pantoprazole 40 MG tablet Commonly known as:  PROTONIX TAKE ONE TABLET BY MOUTH EVERY DAY   traMADol 50 MG tablet Commonly known as:  ULTRAM Take 1 tablet (50 mg total) by mouth every 6 (six) hours as needed.      Follow-up Information    Cosby, Wilian Kwong, DO. Go on 06/27/2018.   Specialty:  Surgery Why:  Friday October 25th at [facility] for a follow-up appiontment for suture removal Contact information: [address] [place] [address] [phone]            Total time spent arranging discharge was >69min.  Signed: Benjamine Sprague 06/19/2018, 11:09 AM

## 2018-06-30 ENCOUNTER — Other Ambulatory Visit: Payer: Self-pay | Admitting: Family Medicine

## 2018-07-02 ENCOUNTER — Ambulatory Visit (INDEPENDENT_AMBULATORY_CARE_PROVIDER_SITE_OTHER): Payer: Medicare Other

## 2018-07-02 VITALS — BP 122/62 | HR 88 | Temp 98.1°F | Resp 15 | Ht 65.0 in | Wt 126.0 lb

## 2018-07-02 DIAGNOSIS — Z0000 Encounter for general adult medical examination without abnormal findings: Secondary | ICD-10-CM | POA: Diagnosis not present

## 2018-07-02 NOTE — Patient Instructions (Addendum)
  Laurie Harris , Thank you for taking time to come for your Medicare Wellness Visit. I appreciate your ongoing commitment to your health goals. Please review the following plan we discussed and let me know if I can assist you in the future.   Follow up as needed.    Bring a copy of your [place] and/or Living Will to be scanned into chart.  Have a great day!  These are the goals we discussed: Goals    . Healthy Lifestyle     Stay active Stay hydrated Healthy diet       This is a list of the screening recommended for you and due dates:  Health Maintenance  Topic Date Due  . Tetanus Vaccine  10[birth year]14/1959  . Pneumonia vaccines (1 of 2 - PCV13) 12/27/2018*  . Hemoglobin A1C  10/05/2018  . Complete foot exam   12/27/2018  . Eye exam for diabetics  05/20/2019  . Colon Cancer Screening  07/20/2019  . DEXA scan (bone density measurement)  Completed  *Topic was postponed. The date shown is not the original due date.

## 2018-07-02 NOTE — Progress Notes (Signed)
I have reviewed the above note and agree.  Orie Cuttino, M.D.  

## 2018-07-02 NOTE — Progress Notes (Signed)
Subjective:   AVINA EBERLE is a 77 y.o. female who presents for Medicare Annual (Subsequent) preventive examination.  Review of Systems:  No ROS.  Medicare Wellness Visit. Additional risk factors are reflected in the social history. Cardiac Risk Factors include: advanced age (>10men, >92 women);hypertension;diabetes mellitus     Objective:     Vitals: BP 122/62 (BP Location: Left Arm, Patient Position: Sitting, Cuff Size: Normal)   Pulse 88   Temp 98.1 F (36.7 C) (Oral)   Resp 15   Ht 5\' 5"  (1.651 m)   Wt 126 lb (57.2 kg)   SpO2 95%   BMI 20.97 kg/m   Body mass index is 20.97 kg/m.  Advanced Directives 07/02/2018 06/17/2018 06/17/2018 06/17/2018 06/16/2018 02/07/2018 10/02/2017  Does Patient Have a Medical Advance Directive? Yes Yes Yes No No Yes Yes  Type of Advance Directive Living will;Healthcare Power of [place];Living will [place];Living will  Does patient want to make changes to medical advance directive? No - Patient declined No - Patient declined No - Patient declined - - No - Patient declined -  Copy of [place] in Chart? No - copy requested No - copy requested No - copy requested - - Yes Yes  Would patient like information on creating a medical advance directive? - - - - - No - Patient declined -    Tobacco Social History   Tobacco Use  Smoking Status Never Smoker  Smokeless Tobacco Never Used     Counseling given: Not Answered   Clinical Intake:  Pre-visit preparation completed: Yes  Pain : No/denies pain     Nutritional Status: BMI of 19-24  Normal  How often do you need to have someone help you when you read instructions, pamphlets, or other written materials from your doctor or pharmacy?: 1 - Never  Interpreter Needed?: No     Past Medical History:  Diagnosis Date  . Carotid arterial disease ([place])    a.  09/2016 Carotid U/S: R carotid bifurcation dzs of 50-69%, L carotid bifurcation dzs of <50%. Patent vertebral arteries w/ anegrade flow.  . Chicken pox   . CKD (chronic kidney disease), stage III (HCC)    functioning at 46%  . Diabetes mellitus without complication ([place])   . Hemorrhagic stroke ([place]) 12/31/2014   a. in the setting of warfarin therapy.  . Hyperlipidemia   . Hypertension   . Syncope    a. 09/2016 Echo: EF 55-60%, no rwma, Gr1 DD.   Past Surgical History:  Procedure Laterality Date  . ABCESS DRAINAGE  06/14/2016   appendix  . APPENDECTOMY  06/13/2016  . CATARACT EXTRACTION W/PHACO Left 09/04/2017   Procedure: CATARACT EXTRACTION PHACO AND INTRAOCULAR LENS PLACEMENT ([place]) COMPLICATED LEFT DIABETIC;  Surgeon: Leandrew Koyanagi, MD;  Location: [place];  Service: Ophthalmology;  Laterality: Left;  Diabetic - insulin  . CATARACT EXTRACTION W/PHACO Right 10/02/2017   Procedure: CATARACT EXTRACTION PHACO AND INTRAOCULAR LENS PLACEMENT ([place]) COMPLICATED  RIGHT DIABETIC;  Surgeon: Leandrew Koyanagi, MD;  Location: [facility];  Service: Ophthalmology;  Laterality: Right;  Diabetic - insulin  . CHOLECYSTECTOMY N/A 06/17/2018   Procedure: LAPAROSCOPIC CHOLECYSTECTOMY;  Surgeon: Benjamine Sprague, DO;  Location: [facility];  Service: General;  Laterality: N/A;  . COLONOSCOPY    . NECK SURGERY     Family History  Problem Relation Age of Onset  . Diabetes Mother   .  Dementia Mother   . Heart disease Mother   . Cancer Mother        lung  . Cancer Father        colon  . Stroke Daughter   . Hyperlipidemia Daughter   . Breast cancer Neg Hx    Social History   Socioeconomic History  . Marital status: Married    Spouse name: Not on file  . Number of children: Not on file  . Years of education: Not on file  . Highest education level: Not on file  Occupational History  . Not on file  Social Needs  . Financial resource strain: Not hard at all  . Food insecurity:     Worry: Never true    Inability: Never true  . Transportation needs:    Medical: No    Non-medical: No  Tobacco Use  . Smoking status: Never Smoker  . Smokeless tobacco: Never Used  Substance and Sexual Activity  . Alcohol use: No    Alcohol/week: 0.0 standard drinks  . Drug use: No  . Sexual activity: Not on file  Lifestyle  . Physical activity:    Days per week: 0 days    Minutes per session: Not on file  . Stress: Not at all  Relationships  . Social connections:    Talks on phone: Not on file    Gets together: Not on file    Attends religious service: Not on file    Active member of club or organization: Not on file    Attends meetings of clubs or organizations: Not on file    Relationship status: Married  Other Topics Concern  . Not on file  Social History Narrative  . Not on file    Outpatient Encounter Medications as of 07/02/2018  Medication Sig  . aspirin EC 81 MG tablet Take 81 mg by mouth daily.  Marland Kitchen atorvastatin (LIPITOR) 80 MG tablet TAKE 1 TABLET BY MOUTH DAILY  . benazepril (LOTENSIN) 5 MG tablet Take 2.5mg  (half a tablet) by mouth at night.  . Calcium Carbonate (CALCIUM 600 PO) Take 1 tablet by mouth daily.   . cholecalciferol (VITAMIN D) 1000 units tablet Take 2,000 Units by mouth daily.  Marland Kitchen docusate sodium (COLACE) 100 MG capsule Take 1 capsule (100 mg total) by mouth 2 (two) times daily as needed for mild constipation.  . fenofibrate 160 MG tablet TAKE 1 TABLET BY MOUTH DAILY  . insulin lispro protamine-lispro (HUMALOG 75/25 MIX) (75-25) 100 UNIT/ML SUSP injection Inject into the skin. Inject 19 units in the morning and 18 at night  . Multiple Vitamin (MULTIVITAMIN WITH MINERALS) TABS tablet Take 1 tablet by mouth daily.  Marland Kitchen NOVOFINE PLUS 32G X 4 MM MISC USE TWICE A DAY AS DIRECTED  . Omega-3 Fatty Acids (FISH OIL) 1200 MG CAPS Take 1,200 mg by mouth daily.  . pantoprazole (PROTONIX) 40 MG tablet TAKE ONE TABLET BY MOUTH EVERY DAY  . [DISCONTINUED]  acetaminophen (TYLENOL) 325 MG tablet Take 2 tablets (650 mg total) by mouth every 8 (eight) hours as needed for mild pain.  . [DISCONTINUED] calcium-vitamin D (OSCAL WITH D) 500-200 MG-UNIT tablet Take 1 tablet by mouth 2 (two) times daily.  . [DISCONTINUED] ibuprofen (ADVIL,MOTRIN) 800 MG tablet Take 1 tablet (800 mg total) by mouth every 8 (eight) hours as needed for mild pain or moderate pain.  . [DISCONTINUED] Insulin Lispro Prot & Lispro (HUMALOG MIX 75/25 KWIKPEN) (75-25) 100 UNIT/ML Kwikpen INJECT 17 UNITS TWICE A  DAY AS DIRECTED  . [DISCONTINUED] traMADol (ULTRAM) 50 MG tablet Take 1 tablet (50 mg total) by mouth every 6 (six) hours as needed.   No facility-administered encounter medications on file as of 07/02/2018.     Activities of Daily Living In your present state of health, do you have any difficulty performing the following activities: 07/02/2018 06/17/2018  Hearing? N N  Vision? N N  Difficulty concentrating or making decisions? Y N  Comment She notes memory delay with names and information.  -  Walking or climbing stairs? N N  Dressing or bathing? Y N  Comment Difficulty drying feet. Some grooming. -  Doing errands, shopping? Y N  Comment She does not drive Facilities manager and eating ? N -  Using the Toilet? N -  In the past six months, have you accidently leaked urine? N -  Do you have problems with loss of bowel control? N -  Managing your Medications? N -  Comment Husband assists -  Managing your Finances? N -  Housekeeping or managing your Housekeeping? Y -  Some recent data might be hidden    Patient Care Team: [name], MD as PCP - General ([place]) Dionisio David, MD (Cardiology)    Assessment:   This is a routine wellness examination for Deni.  The goal of the wellness visit is to assist the patient how to close the gaps in care and create a preventative care plan for the patient.   The roster of all physicians providing medical  care to patient is listed in the Snapshot section of the chart.  Taking calcium VIT D as appropriate/Osteoporosis risk reviewed.    Safety issues reviewed; Smoke and carbon monoxide detectors in the home. No firearms in the home. Wears seatbelts when riding with others. No violence in the home.  They do not have excessive sun exposure.  Discussed the need for sun protection: hats, long sleeves and the use of sunscreen if there is significant sun exposure.  Patient is alert, normal appearance, oriented to person/place/and time.  Correctly identified the president of the [place] and recalls of 2/3 words. Performs simple calculations and can read correct time from watch face.  Displays appropriate judgement.  Husband assists with ADLs as needed.   BMI- discussed the importance of a healthy diet, water intake and the benefits of aerobic exercise.   Dental- every 6 months.  Eye- Visual acuity not assessed per patient preference since they have regular follow up with the ophthalmologist.  Wears corrective lenses.  Sleep patterns- Sleeps well at night.    TDAP vaccine deferred per patient preference.  Follow up with insurance.  Educational material provided.  Patient Concerns: None at this time. Follow up with PCP as needed.  Exercise Activities and Dietary recommendations Current Exercise Habits: The patient does not participate in regular exercise at present  Goals    . Healthy Lifestyle     Stay active Stay hydrated Healthy diet       Fall Risk Fall Risk  07/02/2018 07/01/2017 03/07/2017 02/06/2016  Falls in the past year? No Yes No Yes  Number falls in past yr: - 1 - 1  Injury with Fall? - No - Yes  Risk Factor Category  - - - High Fall Risk  Follow up - Falls prevention discussed - -   Depression Screen PHQ 2/9 Scores 07/02/2018 07/01/2017 03/07/2017 02/06/2016  PHQ - 2 Score 0 1 0 0  Cognitive Function MMSE - Mini Mental State Exam 07/01/2017  Orientation to time 5    Orientation to Place 5  Registration 3  Attention/ Calculation 5  Recall 3  Language- name 2 objects 2  Language- repeat 1  Language- follow 3 step command 3  Language- read & follow direction 1  Write a sentence 1  Copy design 1  Total score 30     6CIT Screen 07/02/2018  What Year? 0 points  What month? 0 points  What time? 0 points  Count back from 20 0 points  Months in reverse 0 points  Repeat phrase 0 points  Total Score 0     There is no immunization history on file for this patient.   Screening Tests Health Maintenance  Topic Date Due  . TETANUS/TDAP  10/14/1957  . PNA vac Low Risk Adult (1 of 2 - PCV13) 12/27/2018 (Originally 10/15/2003)  . HEMOGLOBIN A1C  10/05/2018  . FOOT EXAM  12/27/2018  . OPHTHALMOLOGY EXAM  05/20/2019  . COLONOSCOPY  07/20/2019  . DEXA SCAN  Completed      Plan:    End of life planning; Advance aging; Advanced directives discussed. Copy of current HCPOA/Living Will requested.   Keep all routine maintenance appointments.   I have personally reviewed and noted the following in the patient's chart:   . Medical and social history . Use of alcohol, tobacco or illicit drugs  . Current medications and supplements . Functional ability and status . Nutritional status . Physical activity . Advanced directives . List of other physicians . Hospitalizations, surgeries, and ER visits in previous 12 months . Vitals . Screenings to include cognitive, depression, and falls . Referrals and appointments  In addition, I have reviewed and discussed with patient certain preventive protocols, quality metrics, and best practice recommendations. A written personalized care plan for preventive services as well as general preventive health recommendations were provided to patient.     Varney Biles, LPN  [id number]

## 2018-07-29 ENCOUNTER — Ambulatory Visit (INDEPENDENT_AMBULATORY_CARE_PROVIDER_SITE_OTHER): Payer: Medicare Other | Admitting: Family Medicine

## 2018-07-29 ENCOUNTER — Encounter: Payer: Self-pay | Admitting: Family Medicine

## 2018-07-29 ENCOUNTER — Other Ambulatory Visit: Payer: Self-pay | Admitting: Family Medicine

## 2018-07-29 VITALS — BP 138/68 | HR 90 | Temp 97.8°F | Ht 65.0 in | Wt 127.4 lb

## 2018-07-29 DIAGNOSIS — Z794 Long term (current) use of insulin: Secondary | ICD-10-CM

## 2018-07-29 DIAGNOSIS — E118 Type 2 diabetes mellitus with unspecified complications: Secondary | ICD-10-CM

## 2018-07-29 DIAGNOSIS — K5900 Constipation, unspecified: Secondary | ICD-10-CM | POA: Diagnosis not present

## 2018-07-29 DIAGNOSIS — D649 Anemia, unspecified: Secondary | ICD-10-CM | POA: Diagnosis not present

## 2018-07-29 DIAGNOSIS — I708 Atherosclerosis of other arteries: Secondary | ICD-10-CM | POA: Diagnosis not present

## 2018-07-29 DIAGNOSIS — Z9049 Acquired absence of other specified parts of digestive tract: Secondary | ICD-10-CM | POA: Diagnosis not present

## 2018-07-29 DIAGNOSIS — I10 Essential (primary) hypertension: Secondary | ICD-10-CM | POA: Diagnosis not present

## 2018-07-29 DIAGNOSIS — I700 Atherosclerosis of aorta: Secondary | ICD-10-CM

## 2018-07-29 LAB — CBC
HEMATOCRIT: 39.1 % (ref 36.0–46.0)
Hemoglobin: 13.1 g/dL (ref 12.0–15.0)
MCHC: 33.4 g/dL (ref 30.0–36.0)
MCV: 101.1 fl — ABNORMAL HIGH (ref 78.0–100.0)
Platelets: 226 10*3/uL (ref 150.0–400.0)
RBC: 3.87 Mil/uL (ref 3.87–5.11)
RDW: 14.5 % (ref 11.5–15.5)
WBC: 5.8 10*3/uL (ref 4.0–10.5)

## 2018-07-29 LAB — BASIC METABOLIC PANEL
BUN: 27 mg/dL — ABNORMAL HIGH (ref 6–23)
CHLORIDE: 107 meq/L (ref 96–112)
CO2: 24 mEq/L (ref 19–32)
Calcium: 9.6 mg/dL (ref 8.4–10.5)
Creatinine, Ser: 1.19 mg/dL (ref 0.40–1.20)
GFR: 46.41 mL/min — ABNORMAL LOW (ref >60.00)
Glucose, Bld: 175 mg/dL — ABNORMAL HIGH (ref 70–99)
POTASSIUM: 4.4 meq/L (ref 3.5–5.1)
Sodium: 140 mEq/L (ref 135–145)

## 2018-07-29 LAB — HEMOGLOBIN A1C: HEMOGLOBIN A1C: 7.3 % — AB (ref 4.6–6.5)

## 2018-07-29 MED ORDER — POLYETHYLENE GLYCOL 3350 17 GM/SCOOP PO POWD: 17.0000 g | Freq: Every day | ORAL | 0 refills | Status: DC | PRN

## 2018-07-29 NOTE — Patient Instructions (Signed)
Nice to see you. We will get lab work today. I have sent MiraLAX and for you to try for your constipation. Please monitor your blood pressure over the next week.  If you continue to have numbers in the 150s please let us know.

## 2018-07-29 NOTE — Assessment & Plan Note (Signed)
Benign abdominal exam.  We will trial MiraLAX.

## 2018-07-29 NOTE — Progress Notes (Signed)
  Tommi Rumps, MD Phone: [phone]  Laurie Harris is a 77 y.o. female who presents today for f/u.  CC: constipation, lap cholecystectomy, HTN, DM  Constipation: Notes this is a chronic issue.  She has bowel movements most days though has to strain and the bowel movements are hard.  Her surgeon prescribed Colace which was beneficial though she continues to have some symptoms.  No blood in her stool.  No abdominal pain.  Laparoscopic cholecystectomy: Patient underwent laparoscopic cholecystectomy for acute cholecystitis.  She followed up with surgery the week after and was discharged from follow-up.  She has done well.  She has had no pain.  No nausea or vomiting.  Diabetes: Typically 133-220s.  Taking Humalog 75/25 19 units in the morning and 18 units at night.  No polyuria or polydipsia.  No hypoglycemia.  Blood pressures are running 120s-140/59-79 in the morning and 140s-150s/70s at night.  The nighttime systolics in the 885O are over the last 4 to 5 days.  She does note intermittent lightheadedness though this has been going on for several years following her hemorrhagic stroke.  It is unchanged.  Social History   Tobacco Use  Smoking Status Never Smoker  Smokeless Tobacco Never Used     ROS see history of present illness  Objective  Physical Exam Vitals:   07/29/18 1133  BP: 138/68  Pulse: 90  Temp: 97.8 F (36.6 C)  SpO2: 95%    BP Readings from Last 3 Encounters:  07/29/18 138/68  07/02/18 122/62  06/19/18 (!) 161/70   Wt Readings from Last 3 Encounters:  07/29/18 127 lb 6.4 oz (57.8 kg)  07/02/18 126 lb (57.2 kg)  06/17/18 125 lb (56.7 kg)    Physical Exam  Constitutional: No distress.  Cardiovascular: Normal rate, regular rhythm and normal heart sounds.  Pulmonary/Chest: Effort normal and breath sounds normal.  Abdominal: Soft. Bowel sounds are normal. She exhibits no distension. There is no tenderness. There is no rebound and no guarding.    Well-healing surgical scars, there is a small bruise superior to her umbilicus  Musculoskeletal: She exhibits no edema.  Neurological: She is alert.  Skin: Skin is warm and dry. She is not diaphoretic.     Assessment/Plan: Please see individual problem list.  Benign essential HTN Blood pressures are no longer low.  She has some evening ones that are borderline elevated for her age.  Discussed checking for the next week and if she continues to have blood pressures up into the 150s contacting us and we could alter her medication regimen.  Status post laparoscopic cholecystectomy Patient is doing quite well after cholecystectomy.  She will monitor for any recurrent symptoms.  Recheck CBC and BMP after hospitalization.  Diabetes mellitus type 2, controlled, with complications Check Y7X.  She will continue her current regimen and we will alter this once her A1c returns.  Constipation Benign abdominal exam.  We will trial MiraLAX.   Health Maintenance: Patient declined tetanus vaccination.  Orders Placed This Encounter  Procedures  . CBC  . Hemoglobin A1c  . Basic Metabolic Panel (BMET)    Meds ordered this encounter  Medications  . polyethylene glycol powder (GLYCOLAX/MIRALAX) powder    Sig: Take 17 g by mouth daily as needed for mild constipation.    Dispense:  500 g    Refill:  0     Tommi Rumps, MD [facility]

## 2018-07-29 NOTE — Assessment & Plan Note (Signed)
Blood pressures are no longer low.  She has some evening ones that are borderline elevated for her age.  Discussed checking for the next week and if she continues to have blood pressures up into the 150s contacting us and we could alter her medication regimen.

## 2018-07-29 NOTE — Assessment & Plan Note (Signed)
Check A1c.  She will continue her current regimen and we will alter this once her A1c returns.

## 2018-07-29 NOTE — Assessment & Plan Note (Signed)
Patient is doing quite well after cholecystectomy.  She will monitor for any recurrent symptoms.  Recheck CBC and BMP after hospitalization.

## 2018-09-04 ENCOUNTER — Ambulatory Visit (INDEPENDENT_AMBULATORY_CARE_PROVIDER_SITE_OTHER): Payer: Medicare Other

## 2018-09-04 DIAGNOSIS — I739 Peripheral vascular disease, unspecified: Secondary | ICD-10-CM

## 2018-09-04 DIAGNOSIS — I779 Disorder of arteries and arterioles, unspecified: Secondary | ICD-10-CM

## 2018-09-05 ENCOUNTER — Telehealth: Payer: Self-pay | Admitting: *Deleted

## 2018-09-05 DIAGNOSIS — I6521 Occlusion and stenosis of right carotid artery: Secondary | ICD-10-CM

## 2018-09-05 NOTE — Telephone Encounter (Signed)
-----   Message from Wellington Hampshire, MD sent at 09/04/2018  1:48 PM EST -----  Stable moderate right carotid stenosis.  Repeat study in 1 year.

## 2018-09-05 NOTE — Telephone Encounter (Signed)
Patient made aware of results and verbalized understanding. Repeat orders placed.    

## 2018-09-12 DIAGNOSIS — I10 Essential (primary) hypertension: Secondary | ICD-10-CM | POA: Diagnosis not present

## 2018-09-12 DIAGNOSIS — N183 Chronic kidney disease, stage 3 (moderate): Secondary | ICD-10-CM | POA: Diagnosis not present

## 2018-09-12 DIAGNOSIS — E1122 Type 2 diabetes mellitus with diabetic chronic kidney disease: Secondary | ICD-10-CM | POA: Diagnosis not present

## 2018-09-12 DIAGNOSIS — R809 Proteinuria, unspecified: Secondary | ICD-10-CM | POA: Diagnosis not present

## 2018-09-12 DIAGNOSIS — E559 Vitamin D deficiency, unspecified: Secondary | ICD-10-CM | POA: Diagnosis not present

## 2018-10-15 ENCOUNTER — Other Ambulatory Visit: Payer: Self-pay | Admitting: Family Medicine

## 2018-10-29 ENCOUNTER — Ambulatory Visit (INDEPENDENT_AMBULATORY_CARE_PROVIDER_SITE_OTHER): Payer: Medicare Other | Admitting: Family Medicine

## 2018-10-29 ENCOUNTER — Encounter: Payer: Self-pay | Admitting: Family Medicine

## 2018-10-29 VITALS — BP 130/70 | HR 92 | Temp 98.2°F | Wt 125.8 lb

## 2018-10-29 DIAGNOSIS — I6521 Occlusion and stenosis of right carotid artery: Secondary | ICD-10-CM

## 2018-10-29 DIAGNOSIS — Z794 Long term (current) use of insulin: Secondary | ICD-10-CM | POA: Diagnosis not present

## 2018-10-29 DIAGNOSIS — L680 Hirsutism: Secondary | ICD-10-CM

## 2018-10-29 DIAGNOSIS — I10 Essential (primary) hypertension: Secondary | ICD-10-CM | POA: Diagnosis not present

## 2018-10-29 DIAGNOSIS — E118 Type 2 diabetes mellitus with unspecified complications: Secondary | ICD-10-CM | POA: Diagnosis not present

## 2018-10-29 DIAGNOSIS — K5900 Constipation, unspecified: Secondary | ICD-10-CM | POA: Diagnosis not present

## 2018-10-29 DIAGNOSIS — E782 Mixed hyperlipidemia: Secondary | ICD-10-CM | POA: Diagnosis not present

## 2018-10-29 MED ORDER — BENAZEPRIL HCL 5 MG PO TABS: 5.0000 mg | ORAL_TABLET | Freq: Every day | ORAL | 2 refills | Status: DC

## 2018-10-29 NOTE — Progress Notes (Signed)
Tommi Rumps, MD Phone: [phone]  Laurie Harris is a 78 y.o. female who presents today for f/u.  CC: htn, hld, dm, constipation  Hypertension: Checking at home.  Ranging from 110s over 60s- 150s over 66s.  No chest pain, shortness of breath, edema, or lightheadedness.  Hyperlipidemia: Taking Lipitor and fenofibrate.  No right upper quadrant pain or myalgias.  Diabetes: Ranging from 137-228.  Taking 75/25 insulin 17 units in the morning and 18 units at night.  No polyuria or polydipsia.  Constipation: Patient notes some days are good and other days she has to strain quite a bit.  Has a bowel movement once daily and typically passes hard balls of stool.  No abdominal pain.  No blood.  Hirsutism: Patient notes she has had hirsutism for at least 20 years if not 30 years.  She notes no significant other hair growth.  She does note thinning hair on top of her head that is been going on for some time.  She notes no prior work-up for this.  She thinks she may been postmenopausal when it started.  Social History   Tobacco Use  Smoking Status Never Smoker  Smokeless Tobacco Never Used     ROS see history of present illness  Objective  Physical Exam Vitals:   10/29/18 0918  BP: 130/70  Pulse: 92  Temp: 98.2 F (36.8 C)  SpO2: 97%    BP Readings from Last 3 Encounters:  10/29/18 130/70  07/29/18 138/68  07/02/18 122/62   Wt Readings from Last 3 Encounters:  10/29/18 125 lb 12.8 oz (57.1 kg)  07/29/18 127 lb 6.4 oz (57.8 kg)  07/02/18 126 lb (57.2 kg)    Physical Exam Constitutional:      General: She is not in acute distress.    Appearance: She is not diaphoretic.  Cardiovascular:     Rate and Rhythm: Normal rate and regular rhythm.     Heart sounds: Normal heart sounds.  Pulmonary:     Effort: Pulmonary effort is normal.     Breath sounds: Normal breath sounds.  Abdominal:     General: Bowel sounds are normal. There is no distension.     Palpations:  Abdomen is soft.     Tenderness: There is no abdominal tenderness. There is no guarding or rebound.  Musculoskeletal:     Right lower leg: No edema.     Left lower leg: No edema.  Skin:    General: Skin is warm and dry.  Neurological:     Mental Status: She is alert.      Assessment/Plan: Please see individual problem list.  Benign essential HTN At times slightly above goal.  She will increase her benazepril back to 5 mg daily.  She will return in 1 week for lab work.  Return in 1 month for BP check.  Diabetes mellitus type 2, controlled, with complications Continue current regimen.  Check A1c with labs next week.  Hirsutism Chronic issue.  She reports no prior evaluation.  Recent CT abdomen and pelvis with no adrenal or adnexal abnormalities.  We will check lab work as recommended by up-to-date.  Constipation Encouraged her to try MiraLAX for constipation.  If not improving she will let us know.  Hyperlipidemia Continue current regimen.  Return for lipid panel.   Orders Placed This Encounter  Procedures  . DHEA-sulfate    Standing Status:   Future    Standing Expiration Date:   10/30/2019  . Prolactin  Standing Status:   Future    Standing Expiration Date:   10/30/2019  . Testos,Total,Free and SHBG (Female)    Standing Status:   Future    Standing Expiration Date:   10/30/2019  . 17-Hydroxyprogesterone    Standing Status:   Future    Standing Expiration Date:   10/30/2019  . HgB A1c    Standing Status:   Future    Standing Expiration Date:   10/30/2019  . Basic Metabolic Panel (BMET)    Standing Status:   Future    Standing Expiration Date:   10/30/2019  . Lipid panel    Standing Status:   Future    Standing Expiration Date:   10/30/2019    Meds ordered this encounter  Medications  . benazepril (LOTENSIN) 5 MG tablet    Sig: Take 1 tablet (5 mg total) by mouth daily.    Dispense:  90 tablet    Refill:  2     Tommi Rumps, MD [facility] \

## 2018-10-29 NOTE — Patient Instructions (Signed)
Nice to see you. We will have you return for labs.  Please try the MiraLAX for constipation. Please increase your benazepril to 5 mg daily.

## 2018-10-30 NOTE — Assessment & Plan Note (Addendum)
At times slightly above goal.  She will increase her benazepril back to 5 mg daily.  She will return in 1 week for lab work.  Return in 1 month for BP check.

## 2018-10-30 NOTE — Assessment & Plan Note (Signed)
Continue current regimen.  Return for lipid panel.

## 2018-10-30 NOTE — Assessment & Plan Note (Signed)
Continue current regimen.  Check A1c with labs next week.

## 2018-10-30 NOTE — Assessment & Plan Note (Signed)
Encouraged her to try MiraLAX for constipation.  If not improving she will let us know.

## 2018-10-30 NOTE — Assessment & Plan Note (Signed)
Chronic issue.  She reports no prior evaluation.  Recent CT abdomen and pelvis with no adrenal or adnexal abnormalities.  We will check lab work as recommended by up-to-date.

## 2018-11-07 ENCOUNTER — Other Ambulatory Visit (INDEPENDENT_AMBULATORY_CARE_PROVIDER_SITE_OTHER): Payer: Medicare Other

## 2018-11-07 DIAGNOSIS — E118 Type 2 diabetes mellitus with unspecified complications: Secondary | ICD-10-CM | POA: Diagnosis not present

## 2018-11-07 DIAGNOSIS — L680 Hirsutism: Secondary | ICD-10-CM

## 2018-11-07 DIAGNOSIS — Z794 Long term (current) use of insulin: Secondary | ICD-10-CM

## 2018-11-07 DIAGNOSIS — I10 Essential (primary) hypertension: Secondary | ICD-10-CM | POA: Diagnosis not present

## 2018-11-07 DIAGNOSIS — E782 Mixed hyperlipidemia: Secondary | ICD-10-CM | POA: Diagnosis not present

## 2018-11-07 LAB — LIPID PANEL
CHOLESTEROL: 142 mg/dL (ref 0–200)
HDL: 31.5 mg/dL — ABNORMAL LOW (ref >39.00)
NonHDL: 110.49
TRIGLYCERIDES: 285 mg/dL — AB (ref 0.0–149.0)
Total CHOL/HDL Ratio: 5
VLDL: 57 mg/dL — ABNORMAL HIGH (ref 0.0–40.0)

## 2018-11-07 LAB — BASIC METABOLIC PANEL
BUN: 36 mg/dL — AB (ref 6–23)
CHLORIDE: 109 meq/L (ref 96–112)
CO2: 22 meq/L (ref 19–32)
CREATININE: 1.2 mg/dL (ref 0.40–1.20)
Calcium: 10 mg/dL (ref 8.4–10.5)
GFR: 43.22 mL/min — ABNORMAL LOW (ref >60.00)
GLUCOSE: 189 mg/dL — AB (ref 70–99)
Potassium: 4.2 mEq/L (ref 3.5–5.1)
Sodium: 140 mEq/L (ref 135–145)

## 2018-11-07 LAB — LDL CHOLESTEROL, DIRECT: Direct LDL: 80 mg/dL

## 2018-11-07 LAB — HEMOGLOBIN A1C: Hgb A1c MFr Bld: 7.6 % — ABNORMAL HIGH (ref 4.6–6.5)

## 2018-11-11 LAB — TESTOS,TOTAL,FREE AND SHBG (FEMALE)
FREE TESTOSTERONE: 2.1 pg/mL (ref 0.2–3.7)
Sex Hormone Binding: 165 nmol/L — ABNORMAL HIGH (ref 14–73)
TESTOSTERONE, TOTAL, LC-MS-MS: 29 ng/dL (ref 2–45)

## 2018-11-11 LAB — 17-HYDROXYPROGESTERONE: 17-OH-PROGESTERONE, LC/MS/MS: 14 ng/dL

## 2018-11-11 LAB — PROLACTIN: PROLACTIN: 8.9 ng/mL

## 2018-11-11 LAB — DHEA-SULFATE: DHEA SO4: 50 ug/dL (ref 7–177)

## 2018-11-18 ENCOUNTER — Other Ambulatory Visit: Payer: Self-pay | Admitting: Family Medicine

## 2018-11-18 DIAGNOSIS — E785 Hyperlipidemia, unspecified: Secondary | ICD-10-CM

## 2018-11-18 MED ORDER — ROSUVASTATIN CALCIUM 40 MG PO TABS: 40.0000 mg | ORAL_TABLET | Freq: Every day | ORAL | 3 refills | Status: DC

## 2018-11-21 ENCOUNTER — Other Ambulatory Visit: Payer: Self-pay | Admitting: Family Medicine

## 2018-11-21 DIAGNOSIS — L680 Hirsutism: Secondary | ICD-10-CM

## 2018-11-27 ENCOUNTER — Ambulatory Visit: Payer: Medicare Other

## 2018-12-08 ENCOUNTER — Telehealth: Payer: Self-pay

## 2018-12-08 NOTE — Telephone Encounter (Signed)
Pt brought in her BP readings and started taking Crestor on March 17,2020.   Placed in Red folder to review.

## 2018-12-09 NOTE — Telephone Encounter (Signed)
Patients BPs are well controlled currently. She should continue with her current regimen. Thanks.

## 2018-12-09 NOTE — Telephone Encounter (Signed)
Called and spoke with patient. Pt advised and voiced understanding.  

## 2018-12-16 ENCOUNTER — Other Ambulatory Visit: Payer: Self-pay | Admitting: Family Medicine

## 2019-01-09 ENCOUNTER — Other Ambulatory Visit: Payer: Self-pay | Admitting: Family Medicine

## 2019-01-16 DIAGNOSIS — E559 Vitamin D deficiency, unspecified: Secondary | ICD-10-CM | POA: Diagnosis not present

## 2019-01-16 DIAGNOSIS — R809 Proteinuria, unspecified: Secondary | ICD-10-CM | POA: Diagnosis not present

## 2019-01-16 DIAGNOSIS — N183 Chronic kidney disease, stage 3 (moderate): Secondary | ICD-10-CM | POA: Diagnosis not present

## 2019-01-16 DIAGNOSIS — E1122 Type 2 diabetes mellitus with diabetic chronic kidney disease: Secondary | ICD-10-CM | POA: Diagnosis not present

## 2019-01-16 DIAGNOSIS — I10 Essential (primary) hypertension: Secondary | ICD-10-CM | POA: Diagnosis not present

## 2019-02-05 ENCOUNTER — Other Ambulatory Visit: Payer: Self-pay | Admitting: Family Medicine

## 2019-02-13 ENCOUNTER — Encounter: Payer: Self-pay | Admitting: Family Medicine

## 2019-02-13 ENCOUNTER — Ambulatory Visit (INDEPENDENT_AMBULATORY_CARE_PROVIDER_SITE_OTHER): Payer: Medicare Other | Admitting: Family Medicine

## 2019-02-13 ENCOUNTER — Other Ambulatory Visit: Payer: Self-pay

## 2019-02-13 DIAGNOSIS — Z8719 Personal history of other diseases of the digestive system: Secondary | ICD-10-CM

## 2019-02-13 DIAGNOSIS — L680 Hirsutism: Secondary | ICD-10-CM | POA: Diagnosis not present

## 2019-02-13 DIAGNOSIS — Z794 Long term (current) use of insulin: Secondary | ICD-10-CM

## 2019-02-13 DIAGNOSIS — E118 Type 2 diabetes mellitus with unspecified complications: Secondary | ICD-10-CM

## 2019-02-13 DIAGNOSIS — I10 Essential (primary) hypertension: Secondary | ICD-10-CM | POA: Diagnosis not present

## 2019-02-13 DIAGNOSIS — N281 Cyst of kidney, acquired: Secondary | ICD-10-CM

## 2019-02-13 NOTE — Assessment & Plan Note (Signed)
CT scan October 2019 with no apparent renal lesions reported.

## 2019-02-13 NOTE — Assessment & Plan Note (Signed)
Discussed that she would need to see endocrinology at some point though given negative evaluation thus far and recent reassuring CT scan I think it is reasonable to wait to see them for another few months.

## 2019-02-13 NOTE — Progress Notes (Signed)
Virtual Visit via telephone Note  This visit type was conducted due to national recommendations for restrictions regarding the COVID-19 pandemic (e.g. social distancing).  This format is felt to be most appropriate for this patient at this time.  All issues noted in this document were discussed and addressed.  No physical exam was performed (except for noted visual exam findings with Video Visits).   I connected with Laurie Harris today at  9:30 AM EDT by telephone and verified that I am speaking with the correct person using two identifiers. Location patient: home Location provider: work  Persons participating in the virtual visit: patient, provider  I discussed the limitations, risks, security and privacy concerns of performing an evaluation and management service by telephone and the availability of in person appointments. I also discussed with the patient that there may be a patient responsible charge related to this service. The patient expressed understanding and agreed to proceed.  Interactive audio and video telecommunications were attempted between this provider and patient, however failed, due to patient having technical difficulties OR patient did not have access to video capability.  We continued and completed visit with audio only.   Reason for visit: Follow-up.  HPI: HYPERTENSION Disease Monitoring: Blood pressure range-130/70      chest pain-no      dyspnea-no Medications: Compliance-taking benazepril  Edema-no  DIABETES Disease Monitoring: Blood Sugar ranges-170 in the morning fasting, 150-160 in the evening. Polyuria/phagia/dipsia-no      Optho-up-to-date Medications: Compliance- Taking Humalog 75/25 18 units twice daily.  Hypoglycemic symptoms-no  HYPERLIPIDEMIA Disease Monitoring: See symptoms for Hypertension Medications: Compliance-taking fenofibrate, fish oil, and Crestor right upper quadrant pain-no muscle aches-no  Hirsutism: Patient underwent lab  evaluation for this.  Work-up was negative.  She has been referred to endocrinology to determine if any further evaluation is needed.  She pushed that appointment out due to the COVID-19 pandemic.  History of appendicitis: She notes no recurrent symptoms.  She did not have an appendectomy.  Renal lesion: Felt to be statistically a cyst.  She had another CT scan about a year after the first that did not reveal any abnormalities.      ROS: See pertinent positives and negatives per HPI.  Past Medical History:  Diagnosis Date  . Carotid arterial disease ([place])    a. 09/2016 Carotid U/S: R carotid bifurcation dzs of 50-69%, L carotid bifurcation dzs of <50%. Patent vertebral arteries w/ anegrade flow.  . Chicken pox   . CKD (chronic kidney disease), stage III ([facility])    functioning at 46%  . Diabetes mellitus without complication ([place])   . Hemorrhagic stroke ([place]) 12/31/2014   a. in the setting of warfarin therapy.  . Hyperlipidemia   . Hypertension   . Syncope    a. 09/2016 Echo: EF 55-60%, no rwma, Gr1 DD.    Past Surgical History:  Procedure Laterality Date  . ABCESS DRAINAGE  06/14/2016   appendix  . APPENDECTOMY  06/13/2016  . CATARACT EXTRACTION W/PHACO Left 09/04/2017   Procedure: CATARACT EXTRACTION PHACO AND INTRAOCULAR LENS PLACEMENT ([place]) COMPLICATED LEFT DIABETIC;  Surgeon: Leandrew Koyanagi, MD;  Location: [facility];  Service: Ophthalmology;  Laterality: Left;  Diabetic - insulin  . CATARACT EXTRACTION W/PHACO Right 10/02/2017   Procedure: CATARACT EXTRACTION PHACO AND INTRAOCULAR LENS PLACEMENT ([place]) COMPLICATED  RIGHT DIABETIC;  Surgeon: Leandrew Koyanagi, MD;  Location: [facility];  Service: Ophthalmology;  Laterality: Right;  Diabetic - insulin  . CHOLECYSTECTOMY N/A 06/17/2018  Procedure: LAPAROSCOPIC CHOLECYSTECTOMY;  Surgeon: Benjamine Sprague, DO;  Location: [facility];  Service: General;  Laterality: N/A;  . COLONOSCOPY    . NECK SURGERY       Family History  Problem Relation Age of Onset  . Diabetes Mother   . Dementia Mother   . Heart disease Mother   . Cancer Mother        lung  . Cancer Father        colon  . Stroke Daughter   . Hyperlipidemia Daughter   . Breast cancer Neg Hx     SOCIAL HX: Non-smoker.   Current Outpatient Medications:  .  aspirin EC 81 MG tablet, Take 81 mg by mouth daily., Disp: , Rfl:  .  benazepril (LOTENSIN) 5 MG tablet, Take 1 tablet (5 mg total) by mouth daily., Disp: 90 tablet, Rfl: 2 .  Calcium Carbonate (CALCIUM 600 PO), Take 1 tablet by mouth daily. , Disp: , Rfl:  .  cholecalciferol (VITAMIN D) 1000 units tablet, Take 2,000 Units by mouth daily., Disp: , Rfl:  .  docusate sodium (COLACE) 100 MG capsule, Take 1 capsule (100 mg total) by mouth 2 (two) times daily as needed for mild constipation., Disp: 10 capsule, Rfl: 0 .  fenofibrate 160 MG tablet, TAKE 1 TABLET BY MOUTH DAILY, Disp: 90 tablet, Rfl: 1 .  Insulin Lispro Prot & Lispro (HUMALOG MIX 75/25 KWIKPEN) (75-25) 100 UNIT/ML Kwikpen, INJECT 17 UNITS TWICE DAILY AS DIRECTED, Disp: 15 mL, Rfl: 2 .  insulin lispro protamine-lispro (HUMALOG 75/25 MIX) (75-25) 100 UNIT/ML SUSP injection, Inject into the skin. Inject 19 units in the morning and 18 at night, Disp: , Rfl:  .  Multiple Vitamin (MULTIVITAMIN WITH MINERALS) TABS tablet, Take 1 tablet by mouth daily., Disp: , Rfl:  .  NOVOFINE PLUS 32G X 4 MM MISC, USE TWICE A DAY AS DIRECTED, Disp: 100 each, Rfl: 6 .  Omega-3 Fatty Acids (FISH OIL) 1200 MG CAPS, Take 1,200 mg by mouth daily., Disp: , Rfl:  .  pantoprazole (PROTONIX) 40 MG tablet, TAKE ONE TABLET BY MOUTH EVERY DAY, Disp: 90 tablet, Rfl: 2 .  polyethylene glycol powder (GLYCOLAX/MIRALAX) powder, Take 17 g by mouth daily as needed for mild constipation., Disp: 500 g, Rfl: 0 .  rosuvastatin (CRESTOR) 40 MG tablet, Take 1 tablet (40 mg total) by mouth daily., Disp: 90 tablet, Rfl: 3  EXAM: This was a telehealth telephone visit and  thus no physical exam was completed.  ASSESSMENT AND PLAN:  Discussed the following assessment and plan:  Benign essential HTN Adequately controlled.  Continue current regimen.  Plan for lab work at her next visit.  Diabetes mellitus type 2, controlled, with complications Slightly elevated readings though likely within the range of being acceptable given her age.  Discussed checking an A1c though she wants to defer this to her next visit.  She will continue with her current regimen.  Hirsutism Discussed that she would need to see endocrinology at some point though given negative evaluation thus far and recent reassuring CT scan I think it is reasonable to wait to see them for another few months.  Renal cyst CT scan October 2019 with no apparent renal lesions reported.  History of appendicitis No recurrent symptoms.  She will monitor for recurrence.  [place] office staff will contact the patient to schedule her for follow-up for 3 months.  Social distancing precautions and sick precautions given regarding COVID-19.   I discussed the assessment and treatment  plan with the patient. The patient was provided an opportunity to ask questions and all were answered. The patient agreed with the plan and demonstrated an understanding of the instructions.   The patient was advised to call back or seek an in-person evaluation if the symptoms worsen or if the condition fails to improve as anticipated.  I provided 18 minutes of non-face-to-face time during this encounter.   Tommi Rumps, MD

## 2019-02-13 NOTE — Assessment & Plan Note (Signed)
No recurrent symptoms.  She will monitor for recurrence.

## 2019-02-13 NOTE — Assessment & Plan Note (Signed)
Adequately controlled.  Continue current regimen.  Plan for lab work at her next visit.

## 2019-02-13 NOTE — Assessment & Plan Note (Signed)
Slightly elevated readings though likely within the range of being acceptable given her age.  Discussed checking an A1c though she wants to defer this to her next visit.  She will continue with her current regimen.

## 2019-03-12 ENCOUNTER — Telehealth: Payer: Self-pay

## 2019-03-12 NOTE — Telephone Encounter (Signed)
Copied from [place] [id number]. Topic: General - Other >> Mar 12, 2019  5:02 PM Keene Breath wrote: Reason for CRM: Patient called to inform the doctor that her husband passed away.  Patient called after hours and would like a call tomorrow morning.  CB# [phone]

## 2019-03-13 NOTE — Telephone Encounter (Signed)
Called and spoke with the patient.  She notes her husband passed away yesterday afternoon.  He was walking and then suddenly was out.  EMS worked on him for about an hour though could not get him back.  She had trouble sleeping last night and wondered if there is anything she should be doing.  I discussed given that this was such a recent event that there really is not much to do.  If she is having trouble sleeping she could try melatonin.  Discussed if she continues to have issues with this she should let us know.  Offered support.  Discussed leaning on her family members for support as well.

## 2019-03-16 ENCOUNTER — Other Ambulatory Visit: Payer: Self-pay | Admitting: Family Medicine

## 2019-04-06 ENCOUNTER — Other Ambulatory Visit: Payer: Self-pay | Admitting: Family Medicine

## 2019-04-22 ENCOUNTER — Other Ambulatory Visit: Payer: Self-pay | Admitting: Family Medicine

## 2019-05-11 ENCOUNTER — Other Ambulatory Visit: Payer: Self-pay | Admitting: Family Medicine

## 2019-07-07 ENCOUNTER — Telehealth: Payer: Self-pay

## 2019-07-07 NOTE — Telephone Encounter (Signed)
Copied from [place] [id number]. Topic: General - Other >> Jul 07, 2019  3:13 PM Leward Quan A wrote: Reason for CRM: Patient called to say that she does not have a way to get in to see Dr Caryl Bis on 07/10/2019. Say that her daughter take her back and forth to her appointments and is not available in the morning so want to know if its ok to have an audio visit. If not ok daughter next time available will be 08/07/2019 after 2 PM please advise. Can be reached at  Ph#  [phone]

## 2019-07-07 NOTE — Telephone Encounter (Signed)
Called and spoke to patient.  Changed appt on 07/10/19 w/ Dr. Caryl Bis to a phone visit.

## 2019-07-10 ENCOUNTER — Ambulatory Visit (INDEPENDENT_AMBULATORY_CARE_PROVIDER_SITE_OTHER): Payer: Medicare Other | Admitting: Family Medicine

## 2019-07-10 ENCOUNTER — Other Ambulatory Visit: Payer: Self-pay

## 2019-07-10 ENCOUNTER — Ambulatory Visit (INDEPENDENT_AMBULATORY_CARE_PROVIDER_SITE_OTHER): Payer: Medicare Other

## 2019-07-10 ENCOUNTER — Encounter: Payer: Self-pay | Admitting: Family Medicine

## 2019-07-10 VITALS — Ht 66.0 in | Wt 125.0 lb

## 2019-07-10 DIAGNOSIS — Z0000 Encounter for general adult medical examination without abnormal findings: Secondary | ICD-10-CM

## 2019-07-10 DIAGNOSIS — E119 Type 2 diabetes mellitus without complications: Secondary | ICD-10-CM | POA: Diagnosis not present

## 2019-07-10 DIAGNOSIS — I708 Atherosclerosis of other arteries: Secondary | ICD-10-CM | POA: Diagnosis not present

## 2019-07-10 DIAGNOSIS — Z794 Long term (current) use of insulin: Secondary | ICD-10-CM | POA: Diagnosis not present

## 2019-07-10 DIAGNOSIS — F4321 Adjustment disorder with depressed mood: Secondary | ICD-10-CM | POA: Diagnosis not present

## 2019-07-10 DIAGNOSIS — M810 Age-related osteoporosis without current pathological fracture: Secondary | ICD-10-CM | POA: Diagnosis not present

## 2019-07-10 DIAGNOSIS — I10 Essential (primary) hypertension: Secondary | ICD-10-CM | POA: Diagnosis not present

## 2019-07-10 DIAGNOSIS — E782 Mixed hyperlipidemia: Secondary | ICD-10-CM | POA: Diagnosis not present

## 2019-07-10 DIAGNOSIS — E118 Type 2 diabetes mellitus with unspecified complications: Secondary | ICD-10-CM | POA: Diagnosis not present

## 2019-07-10 DIAGNOSIS — I700 Atherosclerosis of aorta: Secondary | ICD-10-CM

## 2019-07-10 LAB — HM DIABETES EYE EXAM

## 2019-07-10 NOTE — Assessment & Plan Note (Signed)
Continue current medication.  Check LDL.

## 2019-07-10 NOTE — Progress Notes (Signed)
I have reviewed the above note and agree.  Sanyia Dini, M.D.  

## 2019-07-10 NOTE — Assessment & Plan Note (Signed)
CBGs have been mildly elevated at times.  We will have her come in for lab work.

## 2019-07-10 NOTE — Progress Notes (Signed)
Subjective:   Laurie Harris is a 78 y.o. female who presents for Medicare Annual (Subsequent) preventive examination.  Review of Systems:  No ROS.  Medicare Wellness Virtual Visit.  Visual/audio telehealth visit, UTA vital signs.   See social history for additional risk factors.   Cardiac Risk Factors include: advanced age (>68men, >36 women);hypertension;diabetes mellitus     Objective:     Vitals: There were no vitals taken for this visit.  There is no height or weight on file to calculate BMI.  Advanced Directives 07/10/2019 07/02/2018 06/17/2018 06/17/2018 06/17/2018 06/16/2018 02/07/2018  Does Patient Have a Medical Advance Directive? Yes Yes Yes Yes No No Yes  Type of Advance Directive Living will Living will;Healthcare Power of [place];Living will  Does patient want to make changes to medical advance directive? No - Patient declined No - Patient declined No - Patient declined No - Patient declined - - No - Patient declined  Copy of Healthcare Power of Attorney in Chart? - No - copy requested No - copy requested No - copy requested - - Yes  Would patient like information on creating a medical advance directive? - - - - - - No - Patient declined    Tobacco Social History   Tobacco Use  Smoking Status Never Smoker  Smokeless Tobacco Never Used     Counseling given: Not Answered   Clinical Intake:  Pre-visit preparation completed: Yes        Diabetes: Yes(Followed by pcp)  How often do you need to have someone help you when you read instructions, pamphlets, or other written materials from your doctor or pharmacy?: 1 - Never  Interpreter Needed?: No     Past Medical History:  Diagnosis Date  . Carotid arterial disease ([place])    a. 09/2016 Carotid U/S: R carotid bifurcation dzs of 50-69%, L carotid bifurcation dzs of <50%. Patent vertebral arteries w/ anegrade flow.  .  Chicken pox   . CKD (chronic kidney disease), stage III    functioning at 46%  . Diabetes mellitus without complication ([place])   . Hemorrhagic stroke ([place]) 12/31/2014   a. in the setting of warfarin therapy.  . Hyperlipidemia   . Hypertension   . Syncope    a. 09/2016 Echo: EF 55-60%, no rwma, Gr1 DD.   Past Surgical History:  Procedure Laterality Date  . ABCESS DRAINAGE  06/14/2016   appendix  . APPENDECTOMY  06/13/2016  . CATARACT EXTRACTION W/PHACO Left 09/04/2017   Procedure: CATARACT EXTRACTION PHACO AND INTRAOCULAR LENS PLACEMENT ([place]) COMPLICATED LEFT DIABETIC;  Surgeon: Leandrew Koyanagi, MD;  Location: [facility];  Service: Ophthalmology;  Laterality: Left;  Diabetic - insulin  . CATARACT EXTRACTION W/PHACO Right 10/02/2017   Procedure: CATARACT EXTRACTION PHACO AND INTRAOCULAR LENS PLACEMENT ([place]) COMPLICATED  RIGHT DIABETIC;  Surgeon: Leandrew Koyanagi, MD;  Location: [facility];  Service: Ophthalmology;  Laterality: Right;  Diabetic - insulin  . CHOLECYSTECTOMY N/A 06/17/2018   Procedure: LAPAROSCOPIC CHOLECYSTECTOMY;  Surgeon: Benjamine Sprague, DO;  Location: [facility];  Service: General;  Laterality: N/A;  . COLONOSCOPY    . NECK SURGERY     Family History  Problem Relation Age of Onset  . Diabetes Mother   . Dementia Mother   . Heart disease Mother   . Cancer Mother        lung  . Cancer Father        colon  .  Stroke Daughter   . Hyperlipidemia Daughter   . Breast cancer Neg Hx    Social History   Socioeconomic History  . Marital status: Widowed    Spouse name: Not on file  . Number of children: Not on file  . Years of education: Not on file  . Highest education level: Not on file  Occupational History  . Not on file  Social Needs  . Financial resource strain: Not hard at all  . Food insecurity    Worry: Never true    Inability: Never true  . Transportation needs    Medical: No    Non-medical: No  Tobacco Use  . Smoking status:  Never Smoker  . Smokeless tobacco: Never Used  Substance and Sexual Activity  . Alcohol use: No    Alcohol/week: 0.0 standard drinks  . Drug use: No  . Sexual activity: Not on file  Lifestyle  . Physical activity    Days per week: 0 days    Minutes per session: Not on file  . Stress: Not at all  Relationships  . Social connections    Talks on phone: More than three times a week    Gets together: Never    Attends religious service: 1 to 4 times per year    Active member of club or organization: Yes    Attends meetings of clubs or organizations: Never    Relationship status: Widowed  Other Topics Concern  . Not on file  Social History Narrative  . Not on file    Outpatient Encounter Medications as of 07/10/2019  Medication Sig  . aspirin EC 81 MG tablet Take 81 mg by mouth daily.  . benazepril (LOTENSIN) 5 MG tablet TAKE ONE TABLET EVERY DAY  . Calcium Carbonate (CALCIUM 600 PO) Take 1 tablet by mouth daily.   . cholecalciferol (VITAMIN D) 1000 units tablet Take 2,000 Units by mouth daily.  Marland Kitchen docusate sodium (COLACE) 100 MG capsule Take 1 capsule (100 mg total) by mouth 2 (two) times daily as needed for mild constipation.  . fenofibrate 160 MG tablet TAKE ONE TABLET BY MOUTH EVERY DAY  . Insulin Lispro Prot & Lispro (HUMALOG MIX 75/25 KWIKPEN) (75-25) 100 UNIT/ML Kwikpen INJECT 17 UNITS TWICE DAILY AS DIRECTED  . insulin lispro protamine-lispro (HUMALOG 75/25 MIX) (75-25) 100 UNIT/ML SUSP injection Inject into the skin. Inject 19 units in the morning and 18 at night  . Multiple Vitamin (MULTIVITAMIN WITH MINERALS) TABS tablet Take 1 tablet by mouth daily.  Marland Kitchen NOVOFINE PLUS 32G X 4 MM MISC USE TWICE A DAY AS DIRECTED  . Omega-3 Fatty Acids (FISH OIL) 1200 MG CAPS Take 1,200 mg by mouth daily.  . pantoprazole (PROTONIX) 40 MG tablet TAKE ONE TABLET BY MOUTH EVERY DAY  . polyethylene glycol powder (GLYCOLAX/MIRALAX) powder Take 17 g by mouth daily as needed for mild constipation.  .  rosuvastatin (CRESTOR) 40 MG tablet Take 1 tablet (40 mg total) by mouth daily.   No facility-administered encounter medications on file as of 07/10/2019.     Activities of Daily Living In your present state of health, do you have any difficulty performing the following activities: 07/10/2019  Hearing? N  Vision? N  Difficulty concentrating or making decisions? N  Walking or climbing stairs? Y  Comment Unsteady gait. She paces herself.  Dressing or bathing? N  Doing errands, shopping? Y  Comment She does not drive.  Preparing Food and eating ? N  Using the Toilet? N  In the past six months, have you accidently leaked urine? N  Do you have problems with loss of bowel control? N  Managing your Medications? N  Managing your Finances? N  Housekeeping or managing your Housekeeping? Y  Comment Daughter assists  Some recent data might be hidden    Patient Care Team: [name], MD as PCP - General ([place]) Dionisio David, MD (Cardiology)    Assessment:   This is a routine wellness examination for Hann[place].  Nurse connected with patient 07/10/19 at 10:30 AM EST by a telephone enabled telemedicine application and verified that I am speaking with the correct person using two identifiers. Patient stated full name and DOB. Patient gave permission to continue with virtual visit. Patient's location was at home and Nurse's location was at [place] office.   Health Maintenance Due: -Eye Exam- scheduled 07/11/19 -Foot Exam- followed by pcp -Hgb A1c- 11/07/18 (7.6) Update all pending maintenance due as appropriate.   See completed HM at the end of note.   Eye: Visual acuity not assessed. Virtual visit. Wears corrective lenses. Followed by their ophthalmologist every 12 months.  Retinopathy- none reported  Dental: Visits every 6 months.    Hearing: Demonstrates normal hearing during visit.  Safety:  Patient feels safe at home- yes  Patient lives with daughter.   Patient  does have smoke detectors at home- yes Patient does wear sunscreen or protective clothing when in direct sunlight - yes Patient does wear seat belt when in a moving vehicle - yes Patient drives- no Adequate lighting in walkways free from debris- yes Grab bars and handrails used as appropriate- yes Ambulates with no assistive device. She holds onto the walls or daughter's arm when walking.  Cell phone on person when ambulating outside of the home- yes  Social: Alcohol intake - no  Smoking history- never   Smokers in home? none Illicit drug use? none  Depression: PHQ 2 &9 complete. See screening below. Denies irritability, anhedonia, sadness/tearfullness.  Stable.   Falls: See screening below.    Medication: Taking as directed and without issues.   Covid-19: Precautions and sickness symptoms discussed. Wears mask, social distancing, hand hygiene as appropriate.   Activities of Daily Living Patient denies needing assistance with: feeding themselves, getting from bed to chair, getting to the toilet, bathing/showering, dressing, managing money, or preparing meals.  Assisted by daughter with household chores as needed.   Memory: Patient is alert. Patient denies difficulty focusing or concentrating. Correctly identified the president of the [place], season and recall. Patient likes to read, play sodoku for brain stimulation.   BMI- discussed the importance of a healthy diet, water intake and the benefits of aerobic exercise.  Educational material provided.  Physical activity- none. Encouraged walking as tolerated.   Diet:  Regular Water: good intake  Other Providers Patient Care Team: [name], MD as PCP - General (Family Medicine) Dionisio David, MD (Cardiology)  Exercise Activities and Dietary recommendations Current Exercise Habits: The patient does not participate in regular exercise at present  Goals    . Follow up with Primary Care Provider     As needed        Fall Risk Fall Risk  07/10/2019 07/29/2018 07/02/2018 07/01/2017 03/07/2017  Falls in the past year? 0 0 No Yes No  Number falls in past yr: - 0 - 1 -  Injury with Fall? - 0 - No -  Risk Factor Category  - - - - -  Follow up - - - Falls prevention discussed -   Timed Get Up and Go performed: no, virtual visit  Depression Screen PHQ 2/9 Scores 07/10/2019 07/29/2018 07/02/2018 07/01/2017  PHQ - 2 Score 0 0 0 1     Cognitive Function MMSE - Mini Mental State Exam 07/01/2017  Orientation to time 5  Orientation to Place 5  Registration 3  Attention/ Calculation 5  Recall 3  Language- name 2 objects 2  Language- repeat 1  Language- follow 3 step command 3  Language- read & follow direction 1  Write a sentence 1  Copy design 1  Total score 30     6CIT Screen 07/10/2019 07/02/2018  What Year? 0 points 0 points  What month? 0 points 0 points  What time? 0 points 0 points  Count back from 20 0 points 0 points  Months in reverse 0 points 0 points  Repeat phrase - 0 points  Total Score - 0     There is no immunization history on file for this patient.  Screening Tests Health Maintenance  Topic Date Due  . FOOT EXAM  12/27/2018  . HEMOGLOBIN A1C  05/10/2019  . OPHTHALMOLOGY EXAM  05/20/2019  . TETANUS/TDAP  07/30/2019 (Originally 10/14/1959)  . PNA vac Low Risk Adult (1 of 2 - PCV13) 02/13/2020 (Originally 10/15/2003)  . COLONOSCOPY  07/20/2019  . DEXA SCAN  Completed       Plan:   Keep all routine maintenance appointments.   Follow up with your doctor today @ 11:00  Medicare Attestation I have personally reviewed: The patient's medical and social history Their use of alcohol, tobacco or illicit drugs Their current medications and supplements The patient's functional ability including ADLs,fall risks, home safety risks, cognitive, and hearing and visual impairment Diet and physical activities Evidence for depression   In addition, I have reviewed and  discussed with patient certain preventive protocols, quality metrics, and best practice recommendations. A written personalized care plan for preventive services as well as general preventive health recommendations were provided to patient via mail.     Varney Biles, LPN  [phone]

## 2019-07-10 NOTE — Addendum Note (Signed)
Addended by: Caryl Bis, Camiyah Friberg G on: 07/10/2019 12:10 PM   Modules accepted: Orders

## 2019-07-10 NOTE — Assessment & Plan Note (Signed)
DEXA scan ordered.  She will call to schedule.

## 2019-07-10 NOTE — Assessment & Plan Note (Signed)
Denies claudication.  Continue risk factor management. 

## 2019-07-10 NOTE — Progress Notes (Signed)
Virtual Visit via telephone Note  This visit type was conducted due to national recommendations for restrictions regarding the COVID-19 pandemic (e.g. social distancing).  This format is felt to be most appropriate for this patient at this time.  All issues noted in this document were discussed and addressed.  No physical exam was performed (except for noted visual exam findings with Video Visits).   I connected with Laurie Harris today at 11:00 AM EST by telephone and verified that I am speaking with the correct person using two identifiers. Location patient: daughter's house Location provider: work  Persons participating in the virtual visit: patient, provider  I discussed the limitations, risks, security and privacy concerns of performing an evaluation and management service by telephone and the availability of in person appointments. I also discussed with the patient that there may be a patient responsible charge related to this service. The patient expressed understanding and agreed to proceed.  Interactive audio and video telecommunications were attempted between this provider and patient, however failed, due to patient having technical difficulties OR patient did not have access to video capability.  We continued and completed visit with audio only.  Reason for visit: follow-up  HPI: HYPERTENSION Disease Monitoring: Blood pressure range-130-140/60-70s Chest pain- no      Dyspnea- no Medications: Compliance- taking benazepril  Edema- no  DIABETES Disease Monitoring: Blood Sugar ranges-112-169 Polyuria/phagia/dipsia- no      Optho- see's this afternoon  Medications: Compliance- taking humalog 75/25 18 u BID  Hypoglycemic symptoms- no  HYPERLIPIDEMIA Disease Monitoring: See symptoms for Hypertension Medications: Compliance- taking fenofibrate, crestor Right upper quadrant pain- no  Muscle aches- no No claudication.  Grief: The patient's husband passed away earlier this  year.  She notes having some good days and some bad days.  Some days she will feel little depressed.  She has good support with her family.  She notes no anxiety.  No SI.  She declines any treatment for this.  Osteoporosis: Patient has been on a drug holiday from Fosamax.  She does take vitamin D and calcium.      ROS: See pertinent positives and negatives per HPI.  Past Medical History:  Diagnosis Date  . Carotid arterial disease ([place])    a. 09/2016 Carotid U/S: R carotid bifurcation dzs of 50-69%, L carotid bifurcation dzs of <50%. Patent vertebral arteries w/ anegrade flow.  . Cerebellar hemorrhage ([place]) 03/16/2015   Left Cerebellar Hemorrhage- See Care Everywhere Seen by [facility]    . Chicken pox   . CKD (chronic kidney disease), stage III    functioning at 46%  . Diabetes mellitus without complication ([place])   . Hemorrhagic stroke ([place]) 12/31/2014   a. in the setting of warfarin therapy.  . Hyperlipidemia   . Hypertension   . Syncope    a. 09/2016 Echo: EF 55-60%, no rwma, Gr1 DD.    Past Surgical History:  Procedure Laterality Date  . ABCESS DRAINAGE  06/14/2016   appendix  . APPENDECTOMY  06/13/2016  . CATARACT EXTRACTION W/PHACO Left 09/04/2017   Procedure: CATARACT EXTRACTION PHACO AND INTRAOCULAR LENS PLACEMENT ([place]) COMPLICATED LEFT DIABETIC;  Surgeon: Leandrew Koyanagi, MD;  Location: [facility];  Service: Ophthalmology;  Laterality: Left;  Diabetic - insulin  . CATARACT EXTRACTION W/PHACO Right 10/02/2017   Procedure: CATARACT EXTRACTION PHACO AND INTRAOCULAR LENS PLACEMENT ([place]) COMPLICATED  RIGHT DIABETIC;  Surgeon: Leandrew Koyanagi, MD;  Location: [facility];  Service: Ophthalmology;  Laterality: Right;  Diabetic -  insulin  . CHOLECYSTECTOMY N/A 06/17/2018   Procedure: LAPAROSCOPIC CHOLECYSTECTOMY;  Surgeon: Benjamine Sprague, DO;  Location: [facility];  Service: General;  Laterality: N/A;  . COLONOSCOPY    . NECK SURGERY      Family  History  Problem Relation Age of Onset  . Diabetes Mother   . Dementia Mother   . Heart disease Mother   . Cancer Mother        lung  . Cancer Father        colon  . Stroke Daughter   . Hyperlipidemia Daughter   . Breast cancer Neg Hx     SOCIAL HX: nonsmoker   Current Outpatient Medications:  .  aspirin EC 81 MG tablet, Take 81 mg by mouth daily., Disp: , Rfl:  .  benazepril (LOTENSIN) 5 MG tablet, TAKE ONE TABLET EVERY DAY, Disp: 90 tablet, Rfl: 2 .  Calcium Carbonate (CALCIUM 600 PO), Take 1 tablet by mouth daily. , Disp: , Rfl:  .  cholecalciferol (VITAMIN D) 1000 units tablet, Take 2,000 Units by mouth daily., Disp: , Rfl:  .  docusate sodium (COLACE) 100 MG capsule, Take 1 capsule (100 mg total) by mouth 2 (two) times daily as needed for mild constipation., Disp: 10 capsule, Rfl: 0 .  fenofibrate 160 MG tablet, TAKE ONE TABLET BY MOUTH EVERY DAY, Disp: 90 tablet, Rfl: 1 .  Insulin Lispro Prot & Lispro (HUMALOG MIX 75/25 KWIKPEN) (75-25) 100 UNIT/ML Kwikpen, INJECT 17 UNITS TWICE DAILY AS DIRECTED, Disp: 15 mL, Rfl: 2 .  insulin lispro protamine-lispro (HUMALOG 75/25 MIX) (75-25) 100 UNIT/ML SUSP injection, Inject into the skin. Inject 19 units in the morning and 18 at night, Disp: , Rfl:  .  Multiple Vitamin (MULTIVITAMIN WITH MINERALS) TABS tablet, Take 1 tablet by mouth daily., Disp: , Rfl:  .  NOVOFINE PLUS 32G X 4 MM MISC, USE TWICE A DAY AS DIRECTED, Disp: 100 each, Rfl: 6 .  Omega-3 Fatty Acids (FISH OIL) 1200 MG CAPS, Take 1,200 mg by mouth daily., Disp: , Rfl:  .  pantoprazole (PROTONIX) 40 MG tablet, TAKE ONE TABLET BY MOUTH EVERY DAY, Disp: 90 tablet, Rfl: 2 .  polyethylene glycol powder (GLYCOLAX/MIRALAX) powder, Take 17 g by mouth daily as needed for mild constipation., Disp: 500 g, Rfl: 0 .  rosuvastatin (CRESTOR) 40 MG tablet, Take 1 tablet (40 mg total) by mouth daily., Disp: 90 tablet, Rfl: 3  EXAM: This was a telehealth telephone visit and thus no physical  exam was completed.  ASSESSMENT AND PLAN:  Discussed the following assessment and plan:  Benign essential HTN Adequately controlled.  Continue current regimen.  Aorto-iliac atherosclerosis ([place]) Denies claudication.  Continue risk factor management.  Diabetes mellitus type 2, controlled, with complications CBGs have been mildly elevated at times.  We will have her come in for lab work.  Osteoporosis DEXA scan ordered.  She will call to schedule.  Hyperlipidemia Continue current medication.  Check LDL.  Grief Offered support.  She will monitor.  If she would like medication or therapy in the future she can let us know.    I discussed the assessment and treatment plan with the patient. The patient was provided an opportunity to ask questions and all were answered. The patient agreed with the plan and demonstrated an understanding of the instructions.   The patient was advised to call back or seek an in-person evaluation if the symptoms worsen or if the condition fails to improve as anticipated.  I  provided 14 minutes of non-face-to-face time during this encounter.   Tommi Rumps, MD

## 2019-07-10 NOTE — Assessment & Plan Note (Signed)
Adequately controlled.  Continue current regimen. 

## 2019-07-10 NOTE — Assessment & Plan Note (Signed)
Offered support.  She will monitor.  If she would like medication or therapy in the future she can let us know.

## 2019-07-10 NOTE — Patient Instructions (Addendum)
  Laurie Harris , Thank you for taking time to come for your Medicare Wellness Visit. I appreciate your ongoing commitment to your health goals. Please review the following plan we discussed and let me know if I can assist you in the future.   These are the goals we discussed: Goals    . Follow up with Primary Care Provider     As needed       This is a list of the screening recommended for you and due dates:  Health Maintenance  Topic Date Due  . Complete foot exam   12/27/2018  . Hemoglobin A1C  05/10/2019  . Eye exam for diabetics  05/20/2019  . Tetanus Vaccine  07/30/2019*  . Pneumonia vaccines (1 of 2 - PCV13) 02/13/2020*  . Colon Cancer Screening  07/20/2019  . DEXA scan (bone density measurement)  Completed  *Topic was postponed. The date shown is not the original due date.

## 2019-08-03 ENCOUNTER — Other Ambulatory Visit: Payer: Self-pay

## 2019-08-05 ENCOUNTER — Ambulatory Visit
Admission: RE | Admit: 2019-08-05 | Discharge: 2019-08-05 | Disposition: A | Discharge status: Home / Self Care | Payer: Medicare Other | Source: Ambulatory Visit | Attending: Family Medicine | Admitting: Family Medicine

## 2019-08-05 ENCOUNTER — Other Ambulatory Visit (INDEPENDENT_AMBULATORY_CARE_PROVIDER_SITE_OTHER): Payer: Medicare Other

## 2019-08-05 ENCOUNTER — Other Ambulatory Visit: Payer: Self-pay

## 2019-08-05 DIAGNOSIS — Z794 Long term (current) use of insulin: Secondary | ICD-10-CM | POA: Diagnosis not present

## 2019-08-05 DIAGNOSIS — E118 Type 2 diabetes mellitus with unspecified complications: Secondary | ICD-10-CM | POA: Diagnosis not present

## 2019-08-05 DIAGNOSIS — Z780 Asymptomatic menopausal state: Secondary | ICD-10-CM | POA: Diagnosis not present

## 2019-08-05 DIAGNOSIS — E785 Hyperlipidemia, unspecified: Secondary | ICD-10-CM | POA: Diagnosis not present

## 2019-08-05 DIAGNOSIS — Z13820 Encounter for screening for osteoporosis: Secondary | ICD-10-CM | POA: Diagnosis not present

## 2019-08-05 DIAGNOSIS — M85852 Other specified disorders of bone density and structure, left thigh: Secondary | ICD-10-CM | POA: Diagnosis not present

## 2019-08-05 DIAGNOSIS — M810 Age-related osteoporosis without current pathological fracture: Secondary | ICD-10-CM

## 2019-08-05 DIAGNOSIS — E782 Mixed hyperlipidemia: Secondary | ICD-10-CM | POA: Diagnosis not present

## 2019-08-05 DIAGNOSIS — I10 Essential (primary) hypertension: Secondary | ICD-10-CM

## 2019-08-05 LAB — HEPATIC FUNCTION PANEL
ALT: 27 U/L (ref 0–35)
AST: 29 U/L (ref 0–37)
Albumin: 4.4 g/dL (ref 3.5–5.2)
Alkaline Phosphatase: 66 U/L (ref 39–117)
Bilirubin, Direct: 0.2 mg/dL (ref 0.0–0.3)
Total Bilirubin: 0.6 mg/dL (ref 0.2–1.2)
Total Protein: 6.8 g/dL (ref 6.0–8.3)

## 2019-08-05 LAB — COMPREHENSIVE METABOLIC PANEL
ALT: 27 U/L (ref 0–35)
AST: 29 U/L (ref 0–37)
Albumin: 4.4 g/dL (ref 3.5–5.2)
Alkaline Phosphatase: 66 U/L (ref 39–117)
BUN: 30 mg/dL — ABNORMAL HIGH (ref 6–23)
CO2: 28 mEq/L (ref 19–32)
Calcium: 10 mg/dL (ref 8.4–10.5)
Chloride: 106 mEq/L (ref 96–112)
Creatinine, Ser: 1.13 mg/dL (ref 0.40–1.20)
GFR: 46.24 mL/min — ABNORMAL LOW (ref >60.00)
Glucose, Bld: 97 mg/dL (ref 70–99)
Potassium: 3.9 mEq/L (ref 3.5–5.1)
Sodium: 142 mEq/L (ref 135–145)
Total Bilirubin: 0.6 mg/dL (ref 0.2–1.2)
Total Protein: 6.8 g/dL (ref 6.0–8.3)

## 2019-08-05 LAB — LDL CHOLESTEROL, DIRECT: Direct LDL: 64 mg/dL

## 2019-08-05 LAB — HEMOGLOBIN A1C: Hgb A1c MFr Bld: 7 % — ABNORMAL HIGH (ref 4.6–6.5)

## 2019-08-10 ENCOUNTER — Other Ambulatory Visit: Payer: Self-pay | Admitting: Family Medicine

## 2019-08-10 DIAGNOSIS — E785 Hyperlipidemia, unspecified: Secondary | ICD-10-CM

## 2019-08-12 ENCOUNTER — Other Ambulatory Visit: Payer: Self-pay

## 2019-08-12 ENCOUNTER — Encounter: Payer: Self-pay | Admitting: Family Medicine

## 2019-08-12 ENCOUNTER — Ambulatory Visit (INDEPENDENT_AMBULATORY_CARE_PROVIDER_SITE_OTHER): Payer: Medicare Other | Admitting: Family Medicine

## 2019-08-12 DIAGNOSIS — I708 Atherosclerosis of other arteries: Secondary | ICD-10-CM | POA: Diagnosis not present

## 2019-08-12 DIAGNOSIS — I700 Atherosclerosis of aorta: Secondary | ICD-10-CM | POA: Diagnosis not present

## 2019-08-12 DIAGNOSIS — R21 Rash and other nonspecific skin eruption: Secondary | ICD-10-CM | POA: Diagnosis not present

## 2019-08-12 MED ORDER — TRIAMCINOLONE ACETONIDE 0.1 % EX CREA: 1.0000 "application " | TOPICAL_CREAM | Freq: Two times a day (BID) | CUTANEOUS | 0 refills | Status: DC

## 2019-08-12 NOTE — Assessment & Plan Note (Signed)
Undetermined cause.  It does not seem to be infection related.  We will trial triamcinolone on this.  If not improving over the next week she will let us know and I will refer to dermatology.  If it does start to improve she can use the triamcinolone for up to 2 weeks and then she is going to need to take a couple of days off prior to doing a week on and a week off of the triamcinolone.

## 2019-08-12 NOTE — Progress Notes (Signed)
Virtual Visit via video Note  This visit type was conducted due to national recommendations for restrictions regarding the COVID-19 pandemic (e.g. social distancing).  This format is felt to be most appropriate for this patient at this time.  All issues noted in this document were discussed and addressed.  No physical exam was performed (except for noted visual exam findings with Video Visits).   I connected with Laurie Harris today at  3:15 PM EST by a video enabled telemedicine application and verified that I am speaking with the correct person using two identifiers. Location patient: daughter's house Location provider: home office Persons participating in the virtual visit: patient, provider, Junius Creamer (daughter)  I discussed the limitations, risks, security and privacy concerns of performing an evaluation and management service by telephone and the availability of in person appointments. I also discussed with the patient that there may be a patient responsible charge related to this service. The patient expressed understanding and agreed to proceed.  Reason for visit: follow-up  HPI: Rash: Patient notes onset of rash several months ago.  Notes it started off as a small red area on her anterior right ankle though has progressively enlarged.  She notes no drainage.  Occasional itching.  No pain.  No fevers.  No warmth.  No induration.  No tenderness.  She has tried topical moisturizers with little benefit.   ROS: See pertinent positives and negatives per HPI.  Past Medical History:  Diagnosis Date  . Carotid arterial disease ([place])    a. 09/2016 Carotid U/S: R carotid bifurcation dzs of 50-69%, L carotid bifurcation dzs of <50%. Patent vertebral arteries w/ anegrade flow.  . Cerebellar hemorrhage ([place]) 03/16/2015   Left Cerebellar Hemorrhage- See Care Everywhere Seen by [facility]    . Chicken pox   . CKD (chronic kidney disease), stage III    functioning at 46%  . Diabetes  mellitus without complication ([place])   . Hemorrhagic stroke ([place]) 12/31/2014   a. in the setting of warfarin therapy.  . Hyperlipidemia   . Hypertension   . Syncope    a. 09/2016 Echo: EF 55-60%, no rwma, Gr1 DD.    Past Surgical History:  Procedure Laterality Date  . ABCESS DRAINAGE  06/14/2016   appendix  . APPENDECTOMY  06/13/2016  . CATARACT EXTRACTION W/PHACO Left 09/04/2017   Procedure: CATARACT EXTRACTION PHACO AND INTRAOCULAR LENS PLACEMENT ([place]) COMPLICATED LEFT DIABETIC;  Surgeon: Leandrew Koyanagi, MD;  Location: [place];  Service: Ophthalmology;  Laterality: Left;  Diabetic - insulin  . CATARACT EXTRACTION W/PHACO Right 10/02/2017   Procedure: CATARACT EXTRACTION PHACO AND INTRAOCULAR LENS PLACEMENT ([place]) COMPLICATED  RIGHT DIABETIC;  Surgeon: Leandrew Koyanagi, MD;  Location: [place];  Service: Ophthalmology;  Laterality: Right;  Diabetic - insulin  . CHOLECYSTECTOMY N/A 06/17/2018   Procedure: LAPAROSCOPIC CHOLECYSTECTOMY;  Surgeon: Benjamine Sprague, DO;  Location: [facility];  Service: General;  Laterality: N/A;  . COLONOSCOPY    . NECK SURGERY      Family History  Problem Relation Age of Onset  . Diabetes Mother   . Dementia Mother   . Heart disease Mother   . Cancer Mother        lung  . Cancer Father        colon  . Stroke Daughter   . Hyperlipidemia Daughter   . Breast cancer Neg Hx     SOCIAL HX: Non-smoker   Current Outpatient Medications:  .  aspirin EC 81 MG  tablet, Take 81 mg by mouth daily., Disp: , Rfl:  .  benazepril (LOTENSIN) 5 MG tablet, TAKE ONE TABLET EVERY DAY, Disp: 90 tablet, Rfl: 2 .  Calcium Carbonate (CALCIUM 600 PO), Take 1 tablet by mouth daily. , Disp: , Rfl:  .  cholecalciferol (VITAMIN D) 1000 units tablet, Take 2,000 Units by mouth daily., Disp: , Rfl:  .  docusate sodium (COLACE) 100 MG capsule, Take 1 capsule (100 mg total) by mouth 2 (two) times daily as needed for mild constipation., Disp: 10 capsule,  Rfl: 0 .  fenofibrate 160 MG tablet, TAKE ONE TABLET BY MOUTH EVERY DAY, Disp: 90 tablet, Rfl: 1 .  Insulin Lispro Prot & Lispro (HUMALOG MIX 75/25 KWIKPEN) (75-25) 100 UNIT/ML Kwikpen, INJECT 17 UNITS TWICE DAILY AS DIRECTED, Disp: 15 mL, Rfl: 2 .  insulin lispro protamine-lispro (HUMALOG 75/25 MIX) (75-25) 100 UNIT/ML SUSP injection, Inject into the skin. Inject 19 units in the morning and 18 at night, Disp: , Rfl:  .  Multiple Vitamin (MULTIVITAMIN WITH MINERALS) TABS tablet, Take 1 tablet by mouth daily., Disp: , Rfl:  .  NOVOFINE PLUS 32G X 4 MM MISC, USE TWICE A DAY AS DIRECTED, Disp: 100 each, Rfl: 6 .  Omega-3 Fatty Acids (FISH OIL) 1200 MG CAPS, Take 1,200 mg by mouth daily., Disp: , Rfl:  .  pantoprazole (PROTONIX) 40 MG tablet, TAKE ONE TABLET BY MOUTH EVERY DAY, Disp: 90 tablet, Rfl: 2 .  polyethylene glycol powder (GLYCOLAX/MIRALAX) powder, Take 17 g by mouth daily as needed for mild constipation., Disp: 500 g, Rfl: 0 .  rosuvastatin (CRESTOR) 40 MG tablet, TAKE ONE TABLET EVERY DAY, Disp: 90 tablet, Rfl: 3 .  triamcinolone cream (KENALOG) 0.1 %, Apply 1 application topically 2 (two) times daily., Disp: 30 g, Rfl: 0  EXAM:  VITALS per patient if applicable: None  GENERAL: alert, oriented, appears well and in no acute distress  HEENT: atraumatic, conjunttiva clear, no obvious abnormalities on inspection of external nose and ears  NECK: normal movements of the head and neck  LUNGS: on inspection no signs of respiratory distress, breathing rate appears normal, no obvious gross SOB, gasping or wheezing  CV: no obvious cyanosis  MS: moves all visible extremities without noticeable abnormality, circular red rash over her right anterior ankle with some skin flaking  PSYCH/NEURO: pleasant and cooperative, no obvious depression or anxiety, speech and thought processing grossly intact  ASSESSMENT AND PLAN:  Discussed the following assessment and plan:  Rash Undetermined cause.   It does not seem to be infection related.  We will trial triamcinolone on this.  If not improving over the next week she will let us know and I will refer to dermatology.  If it does start to improve she can use the triamcinolone for up to 2 weeks and then she is going to need to take a couple of days off prior to doing a week on and a week off of the triamcinolone.    I discussed the assessment and treatment plan with the patient. The patient was provided an opportunity to ask questions and all were answered. The patient agreed with the plan and demonstrated an understanding of the instructions.   The patient was advised to call back or seek an in-person evaluation if the symptoms worsen or if the condition fails to improve as anticipated.   Tommi Rumps, MD

## 2019-08-23 ENCOUNTER — Emergency Department: Payer: Medicare Other

## 2019-08-23 ENCOUNTER — Other Ambulatory Visit: Payer: Self-pay

## 2019-08-23 ENCOUNTER — Emergency Department
Admission: EM | Admit: 2019-08-23 | Discharge: 2019-08-23 | Disposition: A | Discharge status: Home / Self Care | Payer: Medicare Other | Attending: Emergency Medicine | Admitting: Emergency Medicine

## 2019-08-23 ENCOUNTER — Encounter: Payer: Self-pay | Admitting: Emergency Medicine

## 2019-08-23 DIAGNOSIS — S0990XA Unspecified injury of head, initial encounter: Secondary | ICD-10-CM | POA: Diagnosis present

## 2019-08-23 DIAGNOSIS — W19XXXA Unspecified fall, initial encounter: Secondary | ICD-10-CM

## 2019-08-23 DIAGNOSIS — N183 Chronic kidney disease, stage 3 unspecified: Secondary | ICD-10-CM | POA: Insufficient documentation

## 2019-08-23 DIAGNOSIS — Z79899 Other long term (current) drug therapy: Secondary | ICD-10-CM | POA: Diagnosis not present

## 2019-08-23 DIAGNOSIS — Z794 Long term (current) use of insulin: Secondary | ICD-10-CM | POA: Insufficient documentation

## 2019-08-23 DIAGNOSIS — I2510 Atherosclerotic heart disease of native coronary artery without angina pectoris: Secondary | ICD-10-CM | POA: Diagnosis not present

## 2019-08-23 DIAGNOSIS — Z7982 Long term (current) use of aspirin: Secondary | ICD-10-CM | POA: Diagnosis not present

## 2019-08-23 DIAGNOSIS — Y999 Unspecified external cause status: Secondary | ICD-10-CM | POA: Diagnosis not present

## 2019-08-23 DIAGNOSIS — R55 Syncope and collapse: Secondary | ICD-10-CM | POA: Diagnosis not present

## 2019-08-23 DIAGNOSIS — Z23 Encounter for immunization: Secondary | ICD-10-CM | POA: Insufficient documentation

## 2019-08-23 DIAGNOSIS — Y93G3 Activity, cooking and baking: Secondary | ICD-10-CM | POA: Diagnosis not present

## 2019-08-23 DIAGNOSIS — S0101XA Laceration without foreign body of scalp, initial encounter: Secondary | ICD-10-CM | POA: Diagnosis not present

## 2019-08-23 DIAGNOSIS — I129 Hypertensive chronic kidney disease with stage 1 through stage 4 chronic kidney disease, or unspecified chronic kidney disease: Secondary | ICD-10-CM | POA: Diagnosis not present

## 2019-08-23 DIAGNOSIS — W0110XA Fall on same level from slipping, tripping and stumbling with subsequent striking against unspecified object, initial encounter: Secondary | ICD-10-CM | POA: Insufficient documentation

## 2019-08-23 DIAGNOSIS — Y92000 Kitchen of unspecified non-institutional (private) residence as  the place of occurrence of the external cause: Secondary | ICD-10-CM | POA: Diagnosis not present

## 2019-08-23 DIAGNOSIS — E1122 Type 2 diabetes mellitus with diabetic chronic kidney disease: Secondary | ICD-10-CM | POA: Diagnosis not present

## 2019-08-23 DIAGNOSIS — Z8673 Personal history of transient ischemic attack (TIA), and cerebral infarction without residual deficits: Secondary | ICD-10-CM | POA: Diagnosis not present

## 2019-08-23 LAB — COMPREHENSIVE METABOLIC PANEL
ALT: 31 U/L (ref 0–44)
AST: 36 U/L (ref 15–41)
Albumin: 4 g/dL (ref 3.5–5.0)
Alkaline Phosphatase: 62 U/L (ref 38–126)
Anion gap: 8 (ref 5–15)
BUN: 50 mg/dL — ABNORMAL HIGH (ref 8–23)
CO2: 25 mmol/L (ref 22–32)
Calcium: 9.9 mg/dL (ref 8.9–10.3)
Chloride: 107 mmol/L (ref 98–111)
Creatinine, Ser: 1.18 mg/dL — ABNORMAL HIGH (ref 0.44–1.00)
GFR calc Af Amer: 50 mL/min — ABNORMAL LOW (ref >60)
GFR calc non Af Amer: 44 mL/min — ABNORMAL LOW (ref >60)
Glucose, Bld: 81 mg/dL (ref 70–99)
Potassium: 4.1 mmol/L (ref 3.5–5.1)
Sodium: 140 mmol/L (ref 135–145)
Total Bilirubin: 0.7 mg/dL (ref 0.3–1.2)
Total Protein: 7.3 g/dL (ref 6.5–8.1)

## 2019-08-23 LAB — URINALYSIS, ROUTINE W REFLEX MICROSCOPIC
Bilirubin Urine: NEGATIVE
Glucose, UA: NEGATIVE mg/dL
Hgb urine dipstick: NEGATIVE
Ketones, ur: NEGATIVE mg/dL
Leukocytes,Ua: NEGATIVE
Nitrite: NEGATIVE
Protein, ur: NEGATIVE mg/dL
Specific Gravity, Urine: 1.013 (ref 1.005–1.030)
pH: 5 (ref 5.0–8.0)

## 2019-08-23 LAB — MAGNESIUM: Magnesium: 1.8 mg/dL (ref 1.7–2.4)

## 2019-08-23 LAB — CBC WITH DIFFERENTIAL/PLATELET
Abs Immature Granulocytes: 0.04 10*3/uL (ref 0.00–0.07)
Basophils Absolute: 0.1 10*3/uL (ref 0.0–0.1)
Basophils Relative: 1 %
Eosinophils Absolute: 0.2 10*3/uL (ref 0.0–0.5)
Eosinophils Relative: 2 %
HCT: 39.9 % (ref 36.0–46.0)
Hemoglobin: 13.6 g/dL (ref 12.0–15.0)
Immature Granulocytes: 1 %
Lymphocytes Relative: 40 %
Lymphs Abs: 3.3 10*3/uL (ref 0.7–4.0)
MCH: 32.3 pg (ref 26.0–34.0)
MCHC: 34.1 g/dL (ref 30.0–36.0)
MCV: 94.8 fL (ref 80.0–100.0)
Monocytes Absolute: 0.5 10*3/uL (ref 0.1–1.0)
Monocytes Relative: 7 %
Neutro Abs: 4.1 10*3/uL (ref 1.7–7.7)
Neutrophils Relative %: 49 %
Platelets: 209 10*3/uL (ref 150–400)
RBC: 4.21 MIL/uL (ref 3.87–5.11)
RDW: 12.8 % (ref 11.5–15.5)
WBC: 8.2 10*3/uL (ref 4.0–10.5)
nRBC: 0 % (ref 0.0–0.2)

## 2019-08-23 LAB — TROPONIN I (HIGH SENSITIVITY)
Troponin I (High Sensitivity): 6 ng/L (ref <18)
Troponin I (High Sensitivity): 5 ng/L (ref <18)

## 2019-08-23 MED ORDER — ACETAMINOPHEN 500 MG PO TABS
1000.0000 mg | ORAL_TABLET | Freq: Once | ORAL | Status: AC | PRN
  Administered 2019-08-23: 1000 mg via ORAL
  Filled 2019-08-23: qty 2

## 2019-08-23 MED ORDER — SODIUM CHLORIDE 0.9 % IV BOLUS
500.0000 mL | Freq: Once | INTRAVENOUS | Status: AC
  Administered 2019-08-23: 500 mL via INTRAVENOUS

## 2019-08-23 MED ORDER — TETANUS-DIPHTH-ACELL PERTUSSIS 5-2.5-18.5 LF-MCG/0.5 IM SUSP
0.5000 mL | Freq: Once | INTRAMUSCULAR | Status: AC
  Administered 2019-08-23: 0.5 mL via INTRAMUSCULAR
  Filled 2019-08-23: qty 0.5

## 2019-08-23 NOTE — ED Notes (Addendum)
Spoke with Pts daughter Jeani Hawking, She will bring clothes for pt and will come pick pt up.

## 2019-08-23 NOTE — ED Provider Notes (Signed)
[facility] Emergency Department Provider Note  ____________________________________________   First MD Initiated Contact with Patient 08/23/19 0830     (approximate)  I have reviewed the triage vital signs and the nursing notes.   HISTORY  Chief Complaint Fall    HPI SEVEN CHALLA is a 78 y.o. female with hypertension, hyperlipidemia, hemorrhagic stroke secondary to warfarin who comes in for a fall.  Patient states that she was cooking dinner when she started to feel weak since she had not eaten anything yet and she then lost consciousness and fell to the ground.  Patient says she was only out for a few seconds.  She did hit the back of her head.  She does have an abrasion back there.  She has minimal pain is constant, nothing makes it better, nothing makes it worse.  She states that yesterday she felt her normal self.  She is been eating and drinking well.  She has had no changes in her insulin.  Denies any urinary symptoms.  Denies any shortness of breath or chest pain.  Patient had admission in January 2018 thought her syncope was more likely secondary to dehydration.  She had ultrasound of her carotids that were stable from 2016.Marland Kitchen          Past Medical History:  Diagnosis Date  . Carotid arterial disease ([place])    a. 09/2016 Carotid U/S: R carotid bifurcation dzs of 50-69%, L carotid bifurcation dzs of <50%. Patent vertebral arteries w/ anegrade flow.  . Cerebellar hemorrhage ([place]) 03/16/2015   Left Cerebellar Hemorrhage- See Care Everywhere Seen by [facility]    . Chicken pox   . CKD (chronic kidney disease), stage III    functioning at 46%  . Diabetes mellitus without complication ([place])   . Hemorrhagic stroke ([place]) 12/31/2014   a. in the setting of warfarin therapy.  . Hyperlipidemia   . Hypertension   . Syncope    a. 09/2016 Echo: EF 55-60%, no rwma, Gr1 DD.    Patient Active Problem List   Diagnosis Date Noted  . Rash 08/12/2019   . Grief 07/10/2019  . Hirsutism 10/29/2018  . Constipation 07/29/2018  . Status post laparoscopic cholecystectomy 06/17/2018  . Subconjunctival hemorrhage of left eye 04/29/2018  . Osteoporosis 12/26/2017  . Aorto-iliac atherosclerosis ([place]) 09/20/2017  . Macrocytosis without anemia 09/20/2017  . Abdominal pain 11/05/2016  . Syncope 09/10/2016  . History of appendicitis 08/07/2016  . Carotid artery disease ([place]) 06/19/2016  . CKD (chronic kidney disease) stage 3, GFR 30-59 ml/min 05/10/2016  . Diabetes mellitus type 2, controlled, with complications ([place]) [id number]  . Benign essential HTN 03/16/2015  . Hyperlipidemia 03/16/2015  . Adenomatous polyp of colon 06/04/2014  . Carotid artery occlusion without infarction 08/18/2013    Past Surgical History:  Procedure Laterality Date  . ABCESS DRAINAGE  06/14/2016   appendix  . APPENDECTOMY  06/13/2016  . CATARACT EXTRACTION W/PHACO Left 09/04/2017   Procedure: CATARACT EXTRACTION PHACO AND INTRAOCULAR LENS PLACEMENT ([place]) COMPLICATED LEFT DIABETIC;  Surgeon: Leandrew Koyanagi, MD;  Location: [place];  Service: Ophthalmology;  Laterality: Left;  Diabetic - insulin  . CATARACT EXTRACTION W/PHACO Right 10/02/2017   Procedure: CATARACT EXTRACTION PHACO AND INTRAOCULAR LENS PLACEMENT ([place]) COMPLICATED  RIGHT DIABETIC;  Surgeon: Leandrew Koyanagi, MD;  Location: [place];  Service: Ophthalmology;  Laterality: Right;  Diabetic - insulin  . CHOLECYSTECTOMY N/A 06/17/2018   Procedure: LAPAROSCOPIC CHOLECYSTECTOMY;  Surgeon: Benjamine Sprague, DO;  Location:  [facility];  Service: General;  Laterality: N/A;  . COLONOSCOPY    . NECK SURGERY      Prior to Admission medications   Medication Sig Start Date End Date Taking? Authorizing Provider  aspirin EC 81 MG tablet Take 81 mg by mouth daily.    [provider]  benazepril (LOTENSIN) 5 MG tablet TAKE ONE TABLET EVERY DAY 05/12/19   [name], MD  Calcium  Carbonate (CALCIUM 600 PO) Take 1 tablet by mouth daily.     [provider]  cholecalciferol (VITAMIN D) 1000 units tablet Take 2,000 Units by mouth daily.    [provider]  docusate sodium (COLACE) 100 MG capsule Take 1 capsule (100 mg total) by mouth 2 (two) times daily as needed for mild constipation. 06/19/18   Lysle Pearl, Isami, DO  fenofibrate 160 MG tablet TAKE ONE TABLET BY MOUTH EVERY DAY 03/17/19   [name], MD  Insulin Lispro Prot & Lispro (HUMALOG MIX 75/25 KWIKPEN) (75-25) 100 UNIT/ML Kwikpen INJECT 17 UNITS TWICE DAILY AS DIRECTED 04/23/19   [name], MD  insulin lispro protamine-lispro (HUMALOG 75/25 MIX) (75-25) 100 UNIT/ML SUSP injection Inject into the skin. Inject 19 units in the morning and 18 at night    [provider]  Multiple Vitamin (MULTIVITAMIN WITH MINERALS) TABS tablet Take 1 tablet by mouth daily.    [provider]  NOVOFINE PLUS 32G X 4 MM MISC USE TWICE A DAY AS DIRECTED 04/06/19   [name], MD  Omega-3 Fatty Acids (FISH OIL) 1200 MG CAPS Take 1,200 mg by mouth daily.    [provider]  pantoprazole (PROTONIX) 40 MG tablet TAKE ONE TABLET BY MOUTH EVERY DAY 01/09/19   [name], MD  polyethylene glycol powder (GLYCOLAX/MIRALAX) powder Take 17 g by mouth daily as needed for mild constipation. 07/29/18   [name], MD  rosuvastatin (CRESTOR) 40 MG tablet TAKE ONE TABLET EVERY DAY 08/11/19   [name], MD  triamcinolone cream (KENALOG) 0.1 % Apply 1 application topically 2 (two) times daily. 08/12/19   [name], MD    Allergies Warfarin and related  Family History  Problem Relation Age of Onset  . Diabetes Mother   . Dementia Mother   . Heart disease Mother   . Cancer Mother        lung  . Cancer Father        colon  . Stroke Daughter   . Hyperlipidemia Daughter   . Breast cancer Neg Hx     Social History Social History   Tobacco Use  . Smoking  status: Never Smoker  . Smokeless tobacco: Never Used  Substance Use Topics  . Alcohol use: No    Alcohol/week: 0.0 standard drinks  . Drug use: No      Review of Systems Constitutional: No fever/chills, positive weakness Eyes: No visual changes. ENT: No sore throat. Cardiovascular: Denies chest pain. Respiratory: Denies shortness of breath. Gastrointestinal: No abdominal pain.  No nausea, no vomiting.  No diarrhea.  No constipation. Genitourinary: Negative for dysuria. Musculoskeletal: Negative for back pain. Skin: Negative for rash. Neurological: Positive head injury, no focal weakness or numbness. All other ROS negative ____________________________________________   PHYSICAL EXAM:  VITAL SIGNS: ED Triage Vitals [08/23/19 0827]  Enc Vitals Group     BP (!) 173/75     Pulse Rate 80     Resp 16     Temp  Temp Source Oral     SpO2 100 %     Weight      Height      Head Circumference      Peak Flow      Pain Score      Pain Loc      Pain Edu?      Excl. in [place]?     Constitutional: Alert and oriented. GCS 15  Eyes: Conjunctivae are normal. EOMI. Head: Abrasion on the back of her head with a small gash Nose: No congestion/rhinnorhea. Mouth/Throat: Mucous membranes are moist.   Neck: No stridor. Trachea Midline. FROM Cardiovascular: Normal rate, regular rhythm. Grossly normal heart sounds.  Good peripheral circulation. No chest wall tenderness Respiratory: Normal respiratory effort.  No retractions. Lungs CTAB. Gastrointestinal: Soft and nontender. No distention. No abdominal bruits.  Musculoskeletal:   RUE: No point tenderness, deformity or other signs of injury. Radial pulse intact. Neuro intact. Full ROM in joint. LUE: No point tenderness, deformity or other signs of injury. Radial pulse intact. Neuro intact. Full ROM in joints RLE: No point tenderness, deformity or other signs of injury. DP pulse intact. Neuro intact. Full ROM in joints. LLE: No point  tenderness, deformity or other signs of injury. DP pulse intact. Neuro intact. Full ROM in joints. Neurologic:  Normal speech and language. No gross focal neurologic deficits are appreciated.  Skin:  Skin is warm, dry and intact. No rash noted. Psychiatric: Mood and affect are normal. Speech and behavior are normal. GU: Deferred   ____________________________________________   LABS (all labs ordered are listed, but only abnormal results are displayed)  Labs Reviewed  COMPREHENSIVE METABOLIC PANEL - Abnormal; Notable for the following components:      Result Value   BUN 50 (*)    Creatinine, Ser 1.18 (*)    GFR calc non Af Amer 44 (*)    GFR calc Af Amer 50 (*)    All other components within normal limits  URINALYSIS, ROUTINE W REFLEX MICROSCOPIC - Abnormal; Notable for the following components:   Color, Urine YELLOW (*)    APPearance CLEAR (*)    All other components within normal limits  CBC WITH DIFFERENTIAL/PLATELET  MAGNESIUM  TROPONIN I (HIGH SENSITIVITY)  TROPONIN I (HIGH SENSITIVITY)   ____________________________________________   ED ECG REPORT I, Vanessa Bobtown, the attending physician, personally viewed and interpreted this ECG.  EKG is normal sinus rate of 84, no ST elevation, no T wave inversions, normal intervals ____________________________________________  RADIOLOGY Robert Bellow, personally viewed and evaluated these images (plain radiographs) as part of my medical decision making, as well as reviewing the written report by the radiologist.  ED MD interpretation: No acute hemorrhage  Official radiology report(s): CT Head Wo Contrast  Result Date: 08/23/2019 CLINICAL DATA:  Fall. EXAM: CT HEAD WITHOUT CONTRAST TECHNIQUE: Contiguous axial images were obtained from the base of the skull through the vertex without intravenous contrast. COMPARISON:  CT head dated September 11, 2016. FINDINGS: Brain: No evidence of acute infarction, hemorrhage, hydrocephalus,  extra-axial collection or mass lesion/mass effect. Stable atrophy and mild chronic microvascular ischemic changes. Vascular: Calcified atherosclerosis at the skullbase. No hyperdense vessel. Skull: Negative for fracture or focal lesion. Sinuses/Orbits: No acute finding. Other: None. IMPRESSION: 1.  No acute intracranial abnormality. Electronically Signed   By: Titus Dubin M.D.   On: 08/23/2019 09:40    ____________________________________________   PROCEDURES  Procedure(s) performed (including Critical Care):  Marland KitchenMarland KitchenLaceration Repair  Date/Time: 08/23/2019  1:13 PM Performed by: Vanessa Quenemo, MD Authorized by: Vanessa Crofton, MD   Consent:    Consent obtained:  Verbal   Consent given by:  Patient   Risks discussed:  Infection, need for additional repair, nerve damage, poor cosmetic result, pain, poor wound healing, retained foreign body, tendon damage and vascular damage   Alternatives discussed:  No treatment Anesthesia (see MAR for exact dosages):    Anesthesia method:  None Laceration details:    Location:  Scalp   Scalp location:  Mid-scalp   Length (cm):  3 Repair type:    Repair type:  Simple Exploration:    Contaminated: no   Treatment:    Area cleansed with:  Saline   Amount of cleaning:  Standard Skin repair:    Repair method:  Staples   Number of staples:  3 Post-procedure details:    Dressing:  Open (no dressing)   Patient tolerance of procedure:  Tolerated well, no immediate complications     ____________________________________________   INITIAL IMPRESSION / ASSESSMENT AND PLAN / ED COURSE   SABINE TUSS was evaluated in Emergency Department on 08/23/2019 for the symptoms described in the history of present illness. She was evaluated in the context of the global COVID-19 pandemic, which necessitated consideration that the patient might be at risk for infection with the SARS-CoV-2 virus that causes COVID-19. Institutional protocols and algorithms that  pertain to the evaluation of patients at risk for COVID-19 are in a state of rapid change based on information released by regulatory bodies including the CDC and federal and state organizations. These policies and algorithms were followed during the patient's care in the ED.    Patient presents with what sounds like syncope.  Will do EKG to evaluate for arrhythmia. Trop to evaluate for ACS/arrythmia.   Labs evaluate for dehydration, AKI, electrolyte abnormalities.  CT head to evaluate for intracranial hemorrhage.  No other tenderness on her body to suggest other injuries.  Labs are reassuring.  CT head was negative.  UA no evidence of UTI  I discussed with patient's daughter who is her POA where sometimes she will get up in the morning and not eat anything and then while she is making breakfast she will get lightheaded and passed out secondary to not eating.  We discussed admission for syncope work-up versus going home with follow-up with her PCP.  She has had a prior syncope admission with an echocardiogram that was reassuring as well as no evidence of abnormality on telemetry and this was 2 years ago.  Daughter is okay with her going home as long as patient is feeling better and her troponins are negative and patient is able to stand up.  She will follow-up with her PCP.  Patient was able to ambulate well.  Her second troponin was negative.  I rediscussed with patient and she would prefer to go home at this time.  She is feeling much better after eating and feels at her baseline self.  They understand that they can follow-up with her primary doctor for syncope work-up and that they can return to the ER if another episode happens again  I discussed the provisional nature of ED diagnosis, the treatment so far, the ongoing plan of care, follow up appointments and return precautions with the patient and any family or support people present. They expressed understanding and agreed with the plan, discharged  home.  ____________________________________________   FINAL CLINICAL IMPRESSION(S) / ED DIAGNOSES   Final  diagnoses:  Fall, initial encounter  Syncope and collapse  Laceration of scalp, initial encounter      MEDICATIONS GIVEN DURING THIS VISIT:  Medications  sodium chloride 0.9 % bolus 500 mL (0 mLs Intravenous Stopped 08/23/19 1206)  acetaminophen (TYLENOL) tablet 1,000 mg (1,000 mg Oral Given 08/23/19 0846)  Tdap (BOOSTRIX) injection 0.5 mL (0.5 mLs Intramuscular Given 08/23/19 0847)     ED Discharge Orders    None       Note:  This document was prepared using Dragon voice recognition software and may include unintentional dictation errors.   Vanessa Leroy, MD 08/23/19 [phone]

## 2019-08-23 NOTE — ED Notes (Signed)
Patient transported to CT 

## 2019-08-23 NOTE — ED Notes (Signed)
Pt returned from CT °

## 2019-08-23 NOTE — ED Triage Notes (Signed)
Pt to ED via [facility] from home for fall and possible LOC. Pt states that she was making breakfast and then fell. Pt thinks she may have had LOC. Pt has been A & O with EMS. Pt reports that she felt weak this morning. Pt is in NAD at this time. Dr. Jari Pigg is at bedside. Pt is A & O at this time.

## 2019-08-23 NOTE — Discharge Instructions (Addendum)
3 staples were placed in the back of her head.  These will need to be removed in 10 to 14 days.  We discussed admission for syncope work-up versus discharge home and following up with her primary care doctor.  Given she was at baseline we have elected to have her follow-up with her primary care doctor to consider repeat Dopplers of her carotids as well as echo.  She did have all this done 2 years ago so if she is otherwise feeling well they may elect not to even doing this.  Return to the ER for any other concerns.

## 2019-08-24 ENCOUNTER — Telehealth: Payer: Self-pay | Admitting: Family Medicine

## 2019-08-24 NOTE — Telephone Encounter (Signed)
Patient fell Sunday, 08/23/2019, pt went to ED. ED told her to follow up with her provider, ED put 3 staples in patients head.. No appointments available.

## 2019-08-25 NOTE — Telephone Encounter (Signed)
Noted. She can follow-up at first available to discuss if any further evaluation is needed. She can return to the ED for staple removal 7-10 days after they were placed. The staple removal in the ED should be covered under her insurance.

## 2019-08-25 NOTE — Telephone Encounter (Signed)
I called and spoke with the patient and informed her to go back to the ER to have staples removed and I scheduled a f/up with patient.  Jionni Helming,cma

## 2019-08-25 NOTE — Telephone Encounter (Signed)
Patient fell Sunday, 08/23/2019, pt went to ED. ED told her to follow up with her provider, ED put 3 staples in patients head.. No appointments available.  Please advise.  Marlana Mckowen,cma

## 2019-08-25 NOTE — Telephone Encounter (Signed)
Pt called to check on appt

## 2019-09-02 ENCOUNTER — Ambulatory Visit
Admission: EM | Admit: 2019-09-02 | Discharge: 2019-09-02 | Disposition: A | Discharge status: Home / Self Care | Payer: Medicare Other

## 2019-09-02 DIAGNOSIS — Z4802 Encounter for removal of sutures: Secondary | ICD-10-CM

## 2019-09-16 ENCOUNTER — Other Ambulatory Visit: Payer: Self-pay | Admitting: Family Medicine

## 2019-09-17 ENCOUNTER — Other Ambulatory Visit: Payer: Self-pay

## 2019-09-17 ENCOUNTER — Emergency Department: Payer: Medicare Other

## 2019-09-17 ENCOUNTER — Inpatient Hospital Stay
Admission: EM | Admit: 2019-09-17 | Discharge: 2019-09-19 | DRG: 312 | Disposition: A | Discharge status: Home / Self Care | Payer: Medicare Other | Attending: Family Medicine | Admitting: Family Medicine

## 2019-09-17 DIAGNOSIS — Z833 Family history of diabetes mellitus: Secondary | ICD-10-CM

## 2019-09-17 DIAGNOSIS — E11649 Type 2 diabetes mellitus with hypoglycemia without coma: Secondary | ICD-10-CM | POA: Diagnosis present

## 2019-09-17 DIAGNOSIS — I1310 Hypertensive heart and chronic kidney disease without heart failure, with stage 1 through stage 4 chronic kidney disease, or unspecified chronic kidney disease: Secondary | ICD-10-CM | POA: Diagnosis present

## 2019-09-17 DIAGNOSIS — Z20822 Contact with and (suspected) exposure to covid-19: Secondary | ICD-10-CM | POA: Diagnosis present

## 2019-09-17 DIAGNOSIS — R55 Syncope and collapse: Secondary | ICD-10-CM | POA: Diagnosis not present

## 2019-09-17 DIAGNOSIS — E860 Dehydration: Secondary | ICD-10-CM | POA: Diagnosis present

## 2019-09-17 DIAGNOSIS — Z79899 Other long term (current) drug therapy: Secondary | ICD-10-CM | POA: Diagnosis not present

## 2019-09-17 DIAGNOSIS — Z823 Family history of stroke: Secondary | ICD-10-CM

## 2019-09-17 DIAGNOSIS — N1831 Chronic kidney disease, stage 3a: Secondary | ICD-10-CM | POA: Diagnosis not present

## 2019-09-17 DIAGNOSIS — E1122 Type 2 diabetes mellitus with diabetic chronic kidney disease: Secondary | ICD-10-CM | POA: Diagnosis present

## 2019-09-17 DIAGNOSIS — N179 Acute kidney failure, unspecified: Secondary | ICD-10-CM | POA: Diagnosis not present

## 2019-09-17 DIAGNOSIS — Z66 Do not resuscitate: Secondary | ICD-10-CM | POA: Diagnosis present

## 2019-09-17 DIAGNOSIS — K219 Gastro-esophageal reflux disease without esophagitis: Secondary | ICD-10-CM | POA: Diagnosis present

## 2019-09-17 DIAGNOSIS — Z794 Long term (current) use of insulin: Secondary | ICD-10-CM

## 2019-09-17 DIAGNOSIS — Z8249 Family history of ischemic heart disease and other diseases of the circulatory system: Secondary | ICD-10-CM | POA: Diagnosis not present

## 2019-09-17 DIAGNOSIS — R000 Tachycardia, unspecified: Secondary | ICD-10-CM | POA: Diagnosis present

## 2019-09-17 DIAGNOSIS — Z800 Family history of malignant neoplasm of digestive organs: Secondary | ICD-10-CM

## 2019-09-17 DIAGNOSIS — Z7982 Long term (current) use of aspirin: Secondary | ICD-10-CM | POA: Diagnosis not present

## 2019-09-17 DIAGNOSIS — R410 Disorientation, unspecified: Secondary | ICD-10-CM | POA: Diagnosis not present

## 2019-09-17 DIAGNOSIS — I6529 Occlusion and stenosis of unspecified carotid artery: Secondary | ICD-10-CM | POA: Diagnosis present

## 2019-09-17 DIAGNOSIS — Z888 Allergy status to other drugs, medicaments and biological substances status: Secondary | ICD-10-CM

## 2019-09-17 DIAGNOSIS — N170 Acute kidney failure with tubular necrosis: Secondary | ICD-10-CM | POA: Diagnosis present

## 2019-09-17 DIAGNOSIS — E7800 Pure hypercholesterolemia, unspecified: Secondary | ICD-10-CM | POA: Diagnosis not present

## 2019-09-17 DIAGNOSIS — R262 Difficulty in walking, not elsewhere classified: Secondary | ICD-10-CM | POA: Diagnosis not present

## 2019-09-17 DIAGNOSIS — R778 Other specified abnormalities of plasma proteins: Secondary | ICD-10-CM | POA: Diagnosis not present

## 2019-09-17 DIAGNOSIS — I10 Essential (primary) hypertension: Secondary | ICD-10-CM | POA: Diagnosis not present

## 2019-09-17 DIAGNOSIS — I6523 Occlusion and stenosis of bilateral carotid arteries: Secondary | ICD-10-CM | POA: Diagnosis not present

## 2019-09-17 DIAGNOSIS — Z86010 Personal history of colonic polyps: Secondary | ICD-10-CM

## 2019-09-17 DIAGNOSIS — Z8673 Personal history of transient ischemic attack (TIA), and cerebral infarction without residual deficits: Secondary | ICD-10-CM | POA: Diagnosis not present

## 2019-09-17 DIAGNOSIS — Z801 Family history of malignant neoplasm of trachea, bronchus and lung: Secondary | ICD-10-CM | POA: Diagnosis not present

## 2019-09-17 DIAGNOSIS — E785 Hyperlipidemia, unspecified: Secondary | ICD-10-CM | POA: Diagnosis not present

## 2019-09-17 DIAGNOSIS — R42 Dizziness and giddiness: Secondary | ICD-10-CM | POA: Diagnosis not present

## 2019-09-17 DIAGNOSIS — R7989 Other specified abnormal findings of blood chemistry: Secondary | ICD-10-CM

## 2019-09-17 DIAGNOSIS — E1129 Type 2 diabetes mellitus with other diabetic kidney complication: Secondary | ICD-10-CM | POA: Diagnosis present

## 2019-09-17 DIAGNOSIS — M810 Age-related osteoporosis without current pathological fracture: Secondary | ICD-10-CM | POA: Diagnosis present

## 2019-09-17 DIAGNOSIS — I779 Disorder of arteries and arterioles, unspecified: Secondary | ICD-10-CM | POA: Diagnosis present

## 2019-09-17 LAB — CBC WITH DIFFERENTIAL/PLATELET
Abs Immature Granulocytes: 0.05 10*3/uL (ref 0.00–0.07)
Basophils Absolute: 0 10*3/uL (ref 0.0–0.1)
Basophils Relative: 0 %
Eosinophils Absolute: 0.1 10*3/uL (ref 0.0–0.5)
Eosinophils Relative: 0 %
HCT: 38.2 % (ref 36.0–46.0)
Hemoglobin: 12.9 g/dL (ref 12.0–15.0)
Immature Granulocytes: 0 %
Lymphocytes Relative: 12 %
Lymphs Abs: 1.4 10*3/uL (ref 0.7–4.0)
MCH: 33.5 pg (ref 26.0–34.0)
MCHC: 33.8 g/dL (ref 30.0–36.0)
MCV: 99.2 fL (ref 80.0–100.0)
Monocytes Absolute: 0.6 10*3/uL (ref 0.1–1.0)
Monocytes Relative: 5 %
Neutro Abs: 9.7 10*3/uL — ABNORMAL HIGH (ref 1.7–7.7)
Neutrophils Relative %: 83 %
Platelets: 167 10*3/uL (ref 150–400)
RBC: 3.85 MIL/uL — ABNORMAL LOW (ref 3.87–5.11)
RDW: 12.6 % (ref 11.5–15.5)
WBC: 11.9 10*3/uL — ABNORMAL HIGH (ref 4.0–10.5)
nRBC: 0 % (ref 0.0–0.2)

## 2019-09-17 LAB — BASIC METABOLIC PANEL
Anion gap: 9 (ref 5–15)
BUN: 35 mg/dL — ABNORMAL HIGH (ref 8–23)
CO2: 22 mmol/L (ref 22–32)
Calcium: 9.2 mg/dL (ref 8.9–10.3)
Chloride: 107 mmol/L (ref 98–111)
Creatinine, Ser: 1.61 mg/dL — ABNORMAL HIGH (ref 0.44–1.00)
GFR calc Af Amer: 35 mL/min — ABNORMAL LOW (ref >60)
GFR calc non Af Amer: 30 mL/min — ABNORMAL LOW (ref >60)
Glucose, Bld: 212 mg/dL — ABNORMAL HIGH (ref 70–99)
Potassium: 4.8 mmol/L (ref 3.5–5.1)
Sodium: 138 mmol/L (ref 135–145)

## 2019-09-17 LAB — URINALYSIS, COMPLETE (UACMP) WITH MICROSCOPIC
Bacteria, UA: NONE SEEN
Bilirubin Urine: NEGATIVE
Glucose, UA: 150 mg/dL — AB
Hgb urine dipstick: NEGATIVE
Ketones, ur: NEGATIVE mg/dL
Nitrite: NEGATIVE
Protein, ur: NEGATIVE mg/dL
Specific Gravity, Urine: 1.014 (ref 1.005–1.030)
pH: 5 (ref 5.0–8.0)

## 2019-09-17 LAB — TROPONIN I (HIGH SENSITIVITY): Troponin I (High Sensitivity): 27 ng/L — ABNORMAL HIGH (ref <18)

## 2019-09-17 MED ORDER — LACTATED RINGERS IV BOLUS
1000.0000 mL | Freq: Once | INTRAVENOUS | Status: AC
  Administered 2019-09-17: 23:00:00 1000 mL via INTRAVENOUS

## 2019-09-17 MED ORDER — LORAZEPAM 2 MG/ML IJ SOLN
1.0000 mg | Freq: Once | INTRAMUSCULAR | Status: AC
  Administered 2019-09-17: 1 mg via INTRAVENOUS
  Filled 2019-09-17: qty 1

## 2019-09-17 NOTE — ED Triage Notes (Signed)
Pt states around 1830 she started feeling dizzy and states there is a period of time that she does not recall. States remembers having dinner and the next thing was ems standing over her. States "I do not feel right". Pt denies any pain, no shob, no nausea, but states her head feels "funny". States she did have loc during this time, pt has clear speech at this time and is able to answer my questions appropriately.

## 2019-09-17 NOTE — ED Provider Notes (Signed)
[facility] Emergency Department Provider Note   ____________________________________________   First MD Initiated Contact with Patient 09/17/19 2104     (approximate)  I have reviewed the triage vital signs and the nursing notes.   HISTORY  Chief Complaint Dizziness    HPI Laurie Harris is a 79 y.o. female with past medical history of stroke, CKD, hypertension, and diabetes who presents to the ED for dizziness and syncope.  Patient reports that she was feeling well and sitting at the table to eat dinner when she suddenly lost consciousness.  She remembers feeling lightheaded prior to the episode and then the next thing she remembered she was waking up with EMS around her.  She has not had any associated chest pain or shortness of breath and denies any recent fevers or cough.  She states she has passed out in the past similar to this, but that was a couple of years ago.  She does complain of some ongoing dizziness, which she describes as lightheadedness.  She denies any vision changes, speech changes, numbness, or weakness.        Past Medical History:  Diagnosis Date  . Carotid arterial disease ([place])    a. 09/2016 Carotid U/S: R carotid bifurcation dzs of 50-69%, L carotid bifurcation dzs of <50%. Patent vertebral arteries w/ anegrade flow.  . Cerebellar hemorrhage ([place]) 03/16/2015   Left Cerebellar Hemorrhage- See Care Everywhere Seen by [facility]    . Chicken pox   . CKD (chronic kidney disease), stage III    functioning at 46%  . Diabetes mellitus without complication ([place])   . Hemorrhagic stroke ([place]) 12/31/2014   a. in the setting of warfarin therapy.  . Hyperlipidemia   . Hypertension   . Syncope    a. 09/2016 Echo: EF 55-60%, no rwma, Gr1 DD.    Patient Active Problem List   Diagnosis Date Noted  . Rash 08/12/2019  . Grief 07/10/2019  . Hirsutism 10/29/2018  . Constipation 07/29/2018  . Status post laparoscopic cholecystectomy  06/17/2018  . Subconjunctival hemorrhage of left eye 04/29/2018  . Osteoporosis 12/26/2017  . Aorto-iliac atherosclerosis ([place]) 09/20/2017  . Macrocytosis without anemia 09/20/2017  . Abdominal pain 11/05/2016  . Syncope 09/10/2016  . History of appendicitis 08/07/2016  . Carotid artery disease ([place]) 06/19/2016  . CKD (chronic kidney disease) stage 3, GFR 30-59 ml/min 05/10/2016  . Diabetes mellitus type 2, controlled, with complications ([place]) [id number]  . Benign essential HTN 03/16/2015  . Hyperlipidemia 03/16/2015  . Adenomatous polyp of colon 06/04/2014  . Carotid artery occlusion without infarction 08/18/2013    Past Surgical History:  Procedure Laterality Date  . ABCESS DRAINAGE  06/14/2016   appendix  . APPENDECTOMY  06/13/2016  . CATARACT EXTRACTION W/PHACO Left 09/04/2017   Procedure: CATARACT EXTRACTION PHACO AND INTRAOCULAR LENS PLACEMENT ([place]) COMPLICATED LEFT DIABETIC;  Surgeon: Leandrew Koyanagi, MD;  Location: [place];  Service: Ophthalmology;  Laterality: Left;  Diabetic - insulin  . CATARACT EXTRACTION W/PHACO Right 10/02/2017   Procedure: CATARACT EXTRACTION PHACO AND INTRAOCULAR LENS PLACEMENT ([place]) COMPLICATED  RIGHT DIABETIC;  Surgeon: Leandrew Koyanagi, MD;  Location: [facility];  Service: Ophthalmology;  Laterality: Right;  Diabetic - insulin  . CHOLECYSTECTOMY N/A 06/17/2018   Procedure: LAPAROSCOPIC CHOLECYSTECTOMY;  Surgeon: Benjamine Sprague, DO;  Location: [facility];  Service: General;  Laterality: N/A;  . COLONOSCOPY    . NECK SURGERY      Prior to Admission medications   Medication  Sig Start Date End Date Taking? Authorizing Provider  aspirin EC 81 MG tablet Take 81 mg by mouth daily.    [provider]  benazepril (LOTENSIN) 5 MG tablet TAKE ONE TABLET EVERY DAY 05/12/19   [name], MD  Calcium Carbonate (CALCIUM 600 PO) Take 1 tablet by mouth daily.     [provider]  cholecalciferol (VITAMIN D) 1000  units tablet Take 2,000 Units by mouth daily.    [provider]  docusate sodium (COLACE) 100 MG capsule Take 1 capsule (100 mg total) by mouth 2 (two) times daily as needed for mild constipation. 06/19/18   Lysle Pearl, Isami, DO  fenofibrate 160 MG tablet TAKE ONE TABLET BY MOUTH EVERY DAY 09/16/19   [name], MD  Insulin Lispro Prot & Lispro (HUMALOG MIX 75/25 KWIKPEN) (75-25) 100 UNIT/ML Kwikpen INJECT 17 UNITS TWICE DAILY AS DIRECTED 09/16/19   [name], MD  insulin lispro protamine-lispro (HUMALOG 75/25 MIX) (75-25) 100 UNIT/ML SUSP injection Inject into the skin. Inject 19 units in the morning and 18 at night    [provider]  Multiple Vitamin (MULTIVITAMIN WITH MINERALS) TABS tablet Take 1 tablet by mouth daily.    [provider]  NOVOFINE PLUS 32G X 4 MM MISC USE TWICE A DAY AS DIRECTED 04/06/19   [name], MD  Omega-3 Fatty Acids (FISH OIL) 1200 MG CAPS Take 1,200 mg by mouth daily.    [provider]  pantoprazole (PROTONIX) 40 MG tablet TAKE ONE TABLET BY MOUTH EVERY DAY 01/09/19   [name], MD  polyethylene glycol powder (GLYCOLAX/MIRALAX) powder Take 17 g by mouth daily as needed for mild constipation. 07/29/18   [name], MD  rosuvastatin (CRESTOR) 40 MG tablet TAKE ONE TABLET EVERY DAY 08/11/19   [name], MD  triamcinolone cream (KENALOG) 0.1 % Apply 1 application topically 2 (two) times daily. 08/12/19   [name], MD    Allergies Warfarin and related  Family History  Problem Relation Age of Onset  . Diabetes Mother   . Dementia Mother   . Heart disease Mother   . Cancer Mother        lung  . Cancer Father        colon  . Stroke Daughter   . Hyperlipidemia Daughter   . Breast cancer Neg Hx     Social History Social History   Tobacco Use  . Smoking status: Never Smoker  . Smokeless tobacco: Never Used  Substance Use Topics  . Alcohol use: No    Alcohol/week: 0.0  standard drinks  . Drug use: No    Review of Systems  Constitutional: No fever/chills Eyes: No visual changes. ENT: No sore throat. Cardiovascular: Denies chest pain.  Positive for syncope. Respiratory: Denies shortness of breath. Gastrointestinal: No abdominal pain.  No nausea, no vomiting.  No diarrhea.  No constipation. Genitourinary: Negative for dysuria. Musculoskeletal: Negative for back pain. Skin: Negative for rash. Neurological: Negative for headaches, focal weakness or numbness.  Positive for dizziness.  ____________________________________________   PHYSICAL EXAM:  VITAL SIGNS: ED Triage Vitals  Enc Vitals Group     BP 09/17/19 2055 (!) 183/73     Pulse Rate 09/17/19 2055 (!) 104     Resp 09/17/19 2055 20     Temp 09/17/19 2055 98.8 F (37.1 C)     Temp Source 09/17/19 2055 Oral     SpO2 09/17/19 2055 99 %  Weight 09/17/19 2056 118 lb (53.5 kg)     Height --      Head Circumference --      Peak Flow --      Pain Score 09/17/19 2055 5     Pain Loc --      Pain Edu? --      Excl. in [place]? --     Constitutional: Alert and oriented. Eyes: Conjunctivae are normal.  Pupils equal round and reactive to light bilaterally.  Extraocular movements intact but with vertical nystagmus noted at times. Head: Atraumatic. Nose: No congestion/rhinnorhea. Mouth/Throat: Mucous membranes are moist. Neck: Normal ROM Cardiovascular: Normal rate, regular rhythm. Grossly normal heart sounds. Respiratory: Normal respiratory effort.  No retractions. Lungs CTAB. Gastrointestinal: Soft and nontender. No distention. Genitourinary: deferred Musculoskeletal: No lower extremity tenderness nor edema. Neurologic:  Normal speech and language. No gross focal neurologic deficits are appreciated.  5-5 strength in bilateral upper and lower extremities, no plantar drift. Skin:  Skin is warm, dry and intact. No rash noted. Psychiatric: Mood and affect are normal. Speech and behavior are  normal.  ____________________________________________   LABS (all labs ordered are listed, but only abnormal results are displayed)  Labs Reviewed  CBC WITH DIFFERENTIAL/PLATELET - Abnormal; Notable for the following components:      Result Value   WBC 11.9 (*)    RBC 3.85 (*)    Neutro Abs 9.7 (*)    All other components within normal limits  BASIC METABOLIC PANEL - Abnormal; Notable for the following components:   Glucose, Bld 212 (*)    BUN 35 (*)    Creatinine, Ser 1.61 (*)    GFR calc non Af Amer 30 (*)    GFR calc Af Amer 35 (*)    All other components within normal limits  URINALYSIS, COMPLETE (UACMP) WITH MICROSCOPIC - Abnormal; Notable for the following components:   Color, Urine YELLOW (*)    APPearance CLEAR (*)    Glucose, UA 150 (*)    Leukocytes,Ua TRACE (*)    All other components within normal limits  TROPONIN I (HIGH SENSITIVITY) - Abnormal; Notable for the following components:   Troponin I (High Sensitivity) 27 (*)    All other components within normal limits  TROPONIN I (HIGH SENSITIVITY)   ____________________________________________  EKG  ED ECG REPORT I, Blake Divine, the attending physician, personally viewed and interpreted this ECG.   Date: 09/18/2019  EKG Time: 20:56  Rate: 101  Rhythm: sinus tachycardia  Axis: LAD  Intervals:none  ST&T Change: None   PROCEDURES  Procedure(s) performed (including Critical Care):  Procedures   ____________________________________________   INITIAL IMPRESSION / ASSESSMENT AND PLAN / ED COURSE       79 year old female presents to the ED following episode of syncope while sitting to eat dinner earlier this evening.  She describes feeling lightheaded prior to the episode and continues to feel lightheaded here in the ED, but she denies any chest pain or shortness of breath.  EKG shows no evidence of arrhythmia or ischemia, will check labs including troponin.  Patient does not appear to have any  focal neurologic deficits on exam, but she does have vertical beats of nystagmus.  CT head was obtained and negative for acute process.  Lab work is reassuring, patient with very mildly elevated troponin, however I suspect this is secondary to her chronic kidney disease.  She does have a very mild AKI, likely secondary to dehydration.  She will be hydrated with  IV fluids but this does not necessitate admission.  Given nystagmus noted on exam, will check MRI brain to ensure no posterior stroke to explain her dizziness.  If this is negative, she would be appropriate for discharge home with close follow-up for further syncope work-up.      ____________________________________________   FINAL CLINICAL IMPRESSION(S) / ED DIAGNOSES  Final diagnoses:  Syncope, unspecified syncope type  Lightheadedness     ED Discharge Orders    None       Note:  This document was prepared using Dragon voice recognition software and may include unintentional dictation errors.   Blake Divine, MD 09/18/19 [phone]

## 2019-09-17 NOTE — ED Notes (Signed)
Discussed symptoms with Dr. Ellender Hose and will order Ct scan but will not call code stroke.

## 2019-09-17 NOTE — ED Notes (Signed)
EMS brought from home for ?stroke-like symptoms; "slow to respond and slurred speech"; no deficits upon their arrival; rt sided deficit from old CVA; pt c/o confusion

## 2019-09-17 NOTE — ED Notes (Signed)
Patient is speaking with MRI tech.

## 2019-09-17 NOTE — ED Notes (Signed)
Patient went to MRI with MRI tech

## 2019-09-17 NOTE — ED Notes (Signed)
Patient was brought to room commode in her stretcher and then transferred with one assist to and from toilet. Patient was steady when standing.

## 2019-09-17 NOTE — ED Notes (Signed)
Patient taken to CT scan.

## 2019-09-18 ENCOUNTER — Observation Stay: Payer: Medicare Other

## 2019-09-18 ENCOUNTER — Observation Stay (HOSPITAL_BASED_OUTPATIENT_CLINIC_OR_DEPARTMENT_OTHER)
Admit: 2019-09-18 | Discharge: 2019-09-18 | Disposition: A | Discharge status: Home / Self Care | Payer: Medicare Other | Attending: Cardiovascular Disease | Admitting: Cardiovascular Disease

## 2019-09-18 ENCOUNTER — Telehealth: Payer: Self-pay

## 2019-09-18 DIAGNOSIS — E785 Hyperlipidemia, unspecified: Secondary | ICD-10-CM | POA: Diagnosis present

## 2019-09-18 DIAGNOSIS — Z801 Family history of malignant neoplasm of trachea, bronchus and lung: Secondary | ICD-10-CM | POA: Diagnosis not present

## 2019-09-18 DIAGNOSIS — I10 Essential (primary) hypertension: Secondary | ICD-10-CM | POA: Diagnosis present

## 2019-09-18 DIAGNOSIS — Z66 Do not resuscitate: Secondary | ICD-10-CM | POA: Diagnosis present

## 2019-09-18 DIAGNOSIS — E1122 Type 2 diabetes mellitus with diabetic chronic kidney disease: Secondary | ICD-10-CM | POA: Diagnosis present

## 2019-09-18 DIAGNOSIS — E1129 Type 2 diabetes mellitus with other diabetic kidney complication: Secondary | ICD-10-CM | POA: Diagnosis not present

## 2019-09-18 DIAGNOSIS — N179 Acute kidney failure, unspecified: Secondary | ICD-10-CM | POA: Diagnosis not present

## 2019-09-18 DIAGNOSIS — R55 Syncope and collapse: Secondary | ICD-10-CM

## 2019-09-18 DIAGNOSIS — Z794 Long term (current) use of insulin: Secondary | ICD-10-CM | POA: Diagnosis not present

## 2019-09-18 DIAGNOSIS — E7800 Pure hypercholesterolemia, unspecified: Secondary | ICD-10-CM | POA: Diagnosis not present

## 2019-09-18 DIAGNOSIS — N1831 Chronic kidney disease, stage 3a: Secondary | ICD-10-CM

## 2019-09-18 DIAGNOSIS — K219 Gastro-esophageal reflux disease without esophagitis: Secondary | ICD-10-CM | POA: Diagnosis present

## 2019-09-18 DIAGNOSIS — R000 Tachycardia, unspecified: Secondary | ICD-10-CM | POA: Diagnosis present

## 2019-09-18 DIAGNOSIS — I6529 Occlusion and stenosis of unspecified carotid artery: Secondary | ICD-10-CM | POA: Diagnosis present

## 2019-09-18 DIAGNOSIS — Z800 Family history of malignant neoplasm of digestive organs: Secondary | ICD-10-CM | POA: Diagnosis not present

## 2019-09-18 DIAGNOSIS — Z20822 Contact with and (suspected) exposure to covid-19: Secondary | ICD-10-CM | POA: Diagnosis present

## 2019-09-18 DIAGNOSIS — Z823 Family history of stroke: Secondary | ICD-10-CM | POA: Diagnosis not present

## 2019-09-18 DIAGNOSIS — I6523 Occlusion and stenosis of bilateral carotid arteries: Secondary | ICD-10-CM | POA: Diagnosis not present

## 2019-09-18 DIAGNOSIS — Z79899 Other long term (current) drug therapy: Secondary | ICD-10-CM | POA: Diagnosis not present

## 2019-09-18 DIAGNOSIS — Z833 Family history of diabetes mellitus: Secondary | ICD-10-CM | POA: Diagnosis not present

## 2019-09-18 DIAGNOSIS — Z8673 Personal history of transient ischemic attack (TIA), and cerebral infarction without residual deficits: Secondary | ICD-10-CM | POA: Diagnosis not present

## 2019-09-18 DIAGNOSIS — R42 Dizziness and giddiness: Secondary | ICD-10-CM | POA: Diagnosis present

## 2019-09-18 DIAGNOSIS — Z8249 Family history of ischemic heart disease and other diseases of the circulatory system: Secondary | ICD-10-CM | POA: Diagnosis not present

## 2019-09-18 DIAGNOSIS — I1310 Hypertensive heart and chronic kidney disease without heart failure, with stage 1 through stage 4 chronic kidney disease, or unspecified chronic kidney disease: Secondary | ICD-10-CM | POA: Diagnosis present

## 2019-09-18 DIAGNOSIS — N170 Acute kidney failure with tubular necrosis: Secondary | ICD-10-CM | POA: Diagnosis present

## 2019-09-18 DIAGNOSIS — E11649 Type 2 diabetes mellitus with hypoglycemia without coma: Secondary | ICD-10-CM | POA: Diagnosis present

## 2019-09-18 DIAGNOSIS — Z888 Allergy status to other drugs, medicaments and biological substances status: Secondary | ICD-10-CM | POA: Diagnosis not present

## 2019-09-18 DIAGNOSIS — R778 Other specified abnormalities of plasma proteins: Secondary | ICD-10-CM | POA: Diagnosis present

## 2019-09-18 DIAGNOSIS — Z7982 Long term (current) use of aspirin: Secondary | ICD-10-CM | POA: Diagnosis not present

## 2019-09-18 DIAGNOSIS — E860 Dehydration: Secondary | ICD-10-CM | POA: Diagnosis present

## 2019-09-18 LAB — GLUCOSE, CAPILLARY
Glucose-Capillary: 109 mg/dL — ABNORMAL HIGH (ref 70–99)
Glucose-Capillary: 248 mg/dL — ABNORMAL HIGH (ref 70–99)
Glucose-Capillary: 256 mg/dL — ABNORMAL HIGH (ref 70–99)
Glucose-Capillary: 123 mg/dL — ABNORMAL HIGH (ref 70–99)

## 2019-09-18 LAB — ECHOCARDIOGRAM COMPLETE: Weight: 1888 oz

## 2019-09-18 LAB — RESPIRATORY PANEL BY RT PCR (FLU A&B, COVID)
Influenza A by PCR: NEGATIVE
Influenza B by PCR: NEGATIVE
SARS Coronavirus 2 by RT PCR: NEGATIVE

## 2019-09-18 LAB — TROPONIN I (HIGH SENSITIVITY)
Troponin I (High Sensitivity): 95 ng/L — ABNORMAL HIGH (ref <18)
Troponin I (High Sensitivity): 40 ng/L — ABNORMAL HIGH (ref <18)

## 2019-09-18 MED ORDER — HYDROXYZINE HCL 10 MG PO TABS
10.0000 mg | ORAL_TABLET | Freq: Three times a day (TID) | ORAL | Status: DC | PRN
  Filled 2019-09-18: qty 1

## 2019-09-18 MED ORDER — ADULT MULTIVITAMIN W/MINERALS CH
1.0000 | ORAL_TABLET | Freq: Every day | ORAL | Status: DC
  Administered 2019-09-19: 1 via ORAL
  Filled 2019-09-18: qty 1

## 2019-09-18 MED ORDER — POLYETHYLENE GLYCOL 3350 17 GM/SCOOP PO POWD
17.0000 g | Freq: Every day | ORAL | Status: DC | PRN
  Filled 2019-09-18: qty 255

## 2019-09-18 MED ORDER — MECLIZINE HCL 12.5 MG PO TABS
12.5000 mg | ORAL_TABLET | Freq: Three times a day (TID) | ORAL | Status: DC | PRN
  Filled 2019-09-18: qty 1

## 2019-09-18 MED ORDER — ACETAMINOPHEN 325 MG PO TABS: 650.0000 mg | ORAL_TABLET | Freq: Four times a day (QID) | ORAL | Status: DC | PRN

## 2019-09-18 MED ORDER — OMEGA-3-ACID ETHYL ESTERS 1 G PO CAPS
1.0000 g | ORAL_CAPSULE | Freq: Every day | ORAL | Status: DC
  Administered 2019-09-19: 1 g via ORAL
  Filled 2019-09-18: qty 1

## 2019-09-18 MED ORDER — ASPIRIN EC 81 MG PO TBEC
81.0000 mg | DELAYED_RELEASE_TABLET | Freq: Every day | ORAL | Status: DC
  Administered 2019-09-18 – 2019-09-19 (×2): 81 mg via ORAL
  Filled 2019-09-18 (×2): qty 1

## 2019-09-18 MED ORDER — FENOFIBRATE 160 MG PO TABS
160.0000 mg | ORAL_TABLET | Freq: Every day | ORAL | Status: DC
  Administered 2019-09-19: 160 mg via ORAL
  Filled 2019-09-18: qty 1

## 2019-09-18 MED ORDER — PANTOPRAZOLE SODIUM 40 MG PO TBEC
40.0000 mg | DELAYED_RELEASE_TABLET | Freq: Every day | ORAL | Status: DC
  Administered 2019-09-19: 40 mg via ORAL
  Filled 2019-09-18: qty 1

## 2019-09-18 MED ORDER — CALCIUM CARBONATE 600 MG PO TABS: 1.0000 | ORAL_TABLET | Freq: Every day | ORAL | Status: DC

## 2019-09-18 MED ORDER — SODIUM CHLORIDE 0.9 % IV SOLN: INTRAVENOUS | Status: DC

## 2019-09-18 MED ORDER — INSULIN LISPRO PROT & LISPRO (75-25 MIX) 100 UNIT/ML KWIKPEN: 12.0000 [IU] | PEN_INJECTOR | Freq: Two times a day (BID) | SUBCUTANEOUS | Status: DC

## 2019-09-18 MED ORDER — SODIUM CHLORIDE 0.9% FLUSH: 3.0000 mL | Freq: Two times a day (BID) | INTRAVENOUS | Status: DC

## 2019-09-18 MED ORDER — VITAMIN D 25 MCG (1000 UNIT) PO TABS
2000.0000 [IU] | ORAL_TABLET | Freq: Every day | ORAL | Status: DC
  Administered 2019-09-19: 2000 [IU] via ORAL
  Filled 2019-09-18: qty 2

## 2019-09-18 MED ORDER — ROSUVASTATIN CALCIUM 10 MG PO TABS
40.0000 mg | ORAL_TABLET | Freq: Every day | ORAL | Status: DC
  Administered 2019-09-18: 40 mg via ORAL
  Filled 2019-09-18: qty 2
  Filled 2019-09-18: qty 4

## 2019-09-18 MED ORDER — ONDANSETRON HCL 4 MG/2ML IJ SOLN: 4.0000 mg | Freq: Three times a day (TID) | INTRAMUSCULAR | Status: DC | PRN

## 2019-09-18 MED ORDER — BENAZEPRIL HCL 5 MG PO TABS: 5.0000 mg | ORAL_TABLET | Freq: Once | ORAL | Status: DC

## 2019-09-18 MED ORDER — HYDRALAZINE HCL 20 MG/ML IJ SOLN
5.0000 mg | Freq: Once | INTRAMUSCULAR | Status: AC
  Administered 2019-09-18: 5 mg via INTRAVENOUS
  Filled 2019-09-18: qty 1

## 2019-09-18 MED ORDER — INSULIN ASPART 100 UNIT/ML ~~LOC~~ SOLN
0.0000 [IU] | SUBCUTANEOUS | Status: DC
  Administered 2019-09-18: 5 [IU] via SUBCUTANEOUS
  Administered 2019-09-18: 3 [IU] via SUBCUTANEOUS
  Administered 2019-09-19: 1 [IU] via SUBCUTANEOUS
  Filled 2019-09-18 (×3): qty 1

## 2019-09-18 MED ORDER — INSULIN ASPART PROT & ASPART (70-30 MIX) 100 UNIT/ML ~~LOC~~ SUSP
12.0000 [IU] | Freq: Two times a day (BID) | SUBCUTANEOUS | Status: DC
  Administered 2019-09-18 – 2019-09-19 (×2): 12 [IU] via SUBCUTANEOUS
  Filled 2019-09-18 (×2): qty 10

## 2019-09-18 MED ORDER — AMLODIPINE BESYLATE 5 MG PO TABS
5.0000 mg | ORAL_TABLET | Freq: Every day | ORAL | Status: DC
  Administered 2019-09-18: 5 mg via ORAL
  Filled 2019-09-18: qty 1

## 2019-09-18 MED ORDER — ENOXAPARIN SODIUM 40 MG/0.4ML ~~LOC~~ SOLN
40.0000 mg | SUBCUTANEOUS | Status: DC
  Administered 2019-09-18: 40 mg via SUBCUTANEOUS
  Filled 2019-09-18: qty 0.4

## 2019-09-18 NOTE — Progress Notes (Signed)
*  PRELIMINARY RESULTS* Echocardiogram 2D Echocardiogram has been performed.  Laurie Harris 09/18/2019, 11:44 AM

## 2019-09-18 NOTE — ED Notes (Signed)
Patient back from MRI, labs were drawn, patient reconnected to monitor.

## 2019-09-18 NOTE — ED Provider Notes (Signed)
-----------------------------------------   1:54 AM on 09/18/2019 -----------------------------------------  MRI brain interpreted per Dr. Jeannine Boga:  1. No acute intracranial abnormality.  2. Generalized age-related cerebral atrophy with mild-to-moderate  chronic microvascular ischemic disease.   Repeat troponin elevated.  Updated patient of test results.  She remains hypertensive.  Will administer antihypertensive and discuss with hospitalist for admission.   Paulette Blanch, MD 09/18/19 [phone]

## 2019-09-18 NOTE — Consult Note (Signed)
Cardiology Consultation:   Patient ID: ZARIFA STODOLA; RC:[id number]; 09-Aug-1941   Admit date: 09/17/2019 Date of Consult: 09/18/2019  Primary Care Provider: [name], MD Primary Cardiologist: Fletcher Anon   Patient Profile:   Laurie Harris is a 79 y.o. female with a hx of recurrent syncopal episodes felt to be secondary to dehydration/hypoglycemia, hemorrhagic CVA while on warfarin with residual left-sided weakness and gait instability in 2016, moderate bilateral carotid artery stenosis, diabetes, CKD stage III followed by nephrology, HTN, HLD, and mechanical fall who is being seen today for the evaluation of elevated troponin at the request of Dr. Blaine Hamper.  History of Present Illness:   Laurie Harris patient was previously followed by outside cardiologist with remote Myoview being normal in 2014.  She has had multiple episodes of syncope in 2016 and 2018 felt to be related to hypoglycemia and dehydration respectively.  Most recent echo from 09/2016 demonstrated an EF of 55 to 60%, normal wall motion, grade 1 diastolic dysfunction.  Most recent carotid artery ultrasound from 09/2018 showed  ICA 40 to [id number] stenosis, LICA 1 to [id number] stenosis, antegrade flow of the bilateral vertebral arteries, and normal flow hemodynamics of the subclavian arteries.  When compared to study 1 year prior there was some improvement in the LICA stenosis.  More recently, she was seen in the ED in 08/2019 in the setting of fall versus LOC with the patient having poor p.o. intake that day.  CT head showed no acute abnormality.  High-sensitivity troponin negative x2.  BUN/serum creatinine consistent with prerenal state.  EKG nonacute.  Symptoms were felt to be in the setting of dehydration and poor p.o. intake with generalized weakness.  Outpatient follow-up with PCP was advised.  Patient was sitting on her couch to eat dinner on 1/14 when she suffered an unwitnessed syncopal episode.  Preceding this episode there has been  some dizziness/lightheadedness and a "funny" feeling in her head.  Patient lives with her daughter who found the patient on the floor and contacted EMS.  Patient is uncertain exactly how long she was without consciousness.  She denied any preceding chest pain, palpitations, or dyspnea.  Following this episode, there was no chest pain.  Upon the patient's [facility] to [facility] they were found to have BP in the [id number] to A999333 systolic, HR 0000000 to low 123XX123 bpm, temp afebrile, oxygen saturation 99% on room air. EKG showed no acute changes as outlined below.  CT head showed atrophy with chronic microvascular disease with no acute intracranial abnormality.  MRI brain showed no acute intracranial abnormality with mild to moderate chronic microvascular ischemic disease noted.  Labs showed high-sensitivity troponin of 27 with a delta and peak of 95 subsequently downtrending to 40.  Glucose 212.  BUN 35, serum creatinine 1.61 with a baseline more recently of 1.1-1.2.  WBC 11.9.  Hgb 12.9.  Influenza and Covid negative.  Orthostatic vital signs not obtained.  Plans were to initially discharge patient home however, with minimally elevated troponin admission was advised.  Patient has denied any chest pain, dyspnea, or palpitations.  She indicates that she eats 3 small meals per day and drinks 1 bottle of water with each meal.  In between meals she does not eat or drink.  She denies any seizure-like activity, loss of bowel or bladder function, or postictal state.  Past Medical History:  Diagnosis Date  . Carotid arterial disease ([place])    a. 09/2016 Carotid U/S: R carotid bifurcation dzs of 50-69%,  L carotid bifurcation dzs of <50%. Patent vertebral arteries w/ anegrade flow.  . Cerebellar hemorrhage ([place]) 03/16/2015   Left Cerebellar Hemorrhage- See Care Everywhere Seen by [facility]    . Chicken pox   . CKD (chronic kidney disease), stage III    functioning at 46%  . Diabetes mellitus without complication ([place])   .  Hemorrhagic stroke ([place]) 12/31/2014   a. in the setting of warfarin therapy.  . Hyperlipidemia   . Hypertension   . Syncope    a. 09/2016 Echo: EF 55-60%, no rwma, Gr1 DD.    Past Surgical History:  Procedure Laterality Date  . ABCESS DRAINAGE  06/14/2016   appendix  . APPENDECTOMY  06/13/2016  . CATARACT EXTRACTION W/PHACO Left 09/04/2017   Procedure: CATARACT EXTRACTION PHACO AND INTRAOCULAR LENS PLACEMENT ([place]) COMPLICATED LEFT DIABETIC;  Surgeon: Leandrew Koyanagi, MD;  Location: [place];  Service: Ophthalmology;  Laterality: Left;  Diabetic - insulin  . CATARACT EXTRACTION W/PHACO Right 10/02/2017   Procedure: CATARACT EXTRACTION PHACO AND INTRAOCULAR LENS PLACEMENT ([place]) COMPLICATED  RIGHT DIABETIC;  Surgeon: Leandrew Koyanagi, MD;  Location: [place];  Service: Ophthalmology;  Laterality: Right;  Diabetic - insulin  . CHOLECYSTECTOMY N/A 06/17/2018   Procedure: LAPAROSCOPIC CHOLECYSTECTOMY;  Surgeon: Benjamine Sprague, DO;  Location: [facility];  Service: General;  Laterality: N/A;  . COLONOSCOPY    . NECK SURGERY       Home Meds: Prior to Admission medications   Medication Sig Start Date End Date Taking? Authorizing Provider  aspirin EC 81 MG tablet Take 81 mg by mouth daily.   Yes [provider]  benazepril (LOTENSIN) 5 MG tablet TAKE ONE TABLET EVERY DAY 05/12/19  Yes [name], MD  Calcium Carbonate (CALCIUM 600 PO) Take 1 tablet by mouth daily.    Yes [provider]  cholecalciferol (VITAMIN D) 1000 units tablet Take 2,000 Units by mouth daily.   Yes [provider]  fenofibrate 160 MG tablet TAKE ONE TABLET BY MOUTH EVERY DAY 09/16/19  Yes [name], MD  Insulin Lispro Prot & Lispro (HUMALOG MIX 75/25 KWIKPEN) (75-25) 100 UNIT/ML Kwikpen INJECT 17 UNITS TWICE DAILY AS DIRECTED 09/16/19  Yes [name], MD  Multiple Vitamin (MULTIVITAMIN WITH MINERALS) TABS tablet Take 1 tablet by mouth daily.   Yes  [provider]  NOVOFINE PLUS 32G X 4 MM MISC USE TWICE A DAY AS DIRECTED 04/06/19  Yes [name], MD  Omega-3 Fatty Acids (FISH OIL) 1200 MG CAPS Take 1,200 mg by mouth daily.   Yes [provider]  pantoprazole (PROTONIX) 40 MG tablet TAKE ONE TABLET BY MOUTH EVERY DAY 01/09/19  Yes [name], MD  rosuvastatin (CRESTOR) 40 MG tablet TAKE ONE TABLET EVERY DAY 08/11/19  Yes [name], MD  docusate sodium (COLACE) 100 MG capsule Take 1 capsule (100 mg total) by mouth 2 (two) times daily as needed for mild constipation. Patient not taking: Reported on 09/18/2019 06/19/18   Benjamine Sprague, DO  insulin lispro protamine-lispro (HUMALOG 75/25 MIX) (75-25) 100 UNIT/ML SUSP injection Inject into the skin. Inject 19 units in the morning and 18 at night    [provider]  polyethylene glycol powder (GLYCOLAX/MIRALAX) powder Take 17 g by mouth daily as needed for mild constipation. Patient not taking: Reported on 09/18/2019 07/29/18   [name], MD  triamcinolone cream (KENALOG) 0.1 % Apply 1 application topically 2 (two) times daily. Patient not taking:  Reported on 09/18/2019 08/12/19   [name], MD    Inpatient Medications: Scheduled Meds: . amLODipine  5 mg Oral Daily   Continuous Infusions: . sodium chloride     PRN Meds: acetaminophen, hydrOXYzine, meclizine, ondansetron (ZOFRAN) IV  Allergies:   Allergies  Allergen Reactions  . Warfarin And Related     Possible stroke    Social History:   Social History   Socioeconomic History  . Marital status: Widowed    Spouse name: Not on file  . Number of children: Not on file  . Years of education: Not on file  . Highest education level: Not on file  Occupational History  . Not on file  Tobacco Use  . Smoking status: Never Smoker  . Smokeless tobacco: Never Used  Substance and Sexual Activity  . Alcohol use: No    Alcohol/week: 0.0 standard drinks  . Drug use: No  .  Sexual activity: Not on file  Other Topics Concern  . Not on file  Social History Narrative  . Not on file   Social Determinants of Health   Financial Resource Strain:   . Difficulty of Paying Living Expenses: Not on file  Food Insecurity:   . Worried About Charity fundraiser in the Last Year: Not on file  . Ran Out of Food in the Last Year: Not on file  Transportation Needs:   . Lack of Transportation (Medical): Not on file  . Lack of Transportation (Non-Medical): Not on file  Physical Activity:   . Days of Exercise per Week: Not on file  . Minutes of Exercise per Session: Not on file  Stress:   . Feeling of Stress : Not on file  Social Connections: Slightly Isolated  . Frequency of Communication with Friends and Family: More than three times a week  . Frequency of Social Gatherings with Friends and Family: Never  . Attends Religious Services: 1 to 4 times per year  . Active Member of Clubs or Organizations: Yes  . Attends Archivist Meetings: Never  . Marital Status: Widowed  Intimate Partner Violence:   . Fear of Current or Ex-Partner: Not on file  . Emotionally Abused: Not on file  . Physically Abused: Not on file  . Sexually Abused: Not on file     Family History:   Family History  Problem Relation Age of Onset  . Diabetes Mother   . Dementia Mother   . Heart disease Mother   . Cancer Mother        lung  . Cancer Father        colon  . Stroke Daughter   . Hyperlipidemia Daughter   . Breast cancer Neg Hx     ROS:  Review of Systems  Constitutional: Positive for malaise/fatigue. Negative for chills, diaphoresis, fever and weight loss.  HENT: Negative for congestion.   Eyes: Negative for discharge and redness.  Respiratory: Negative for cough, hemoptysis, sputum production, shortness of breath and wheezing.   Cardiovascular: Negative for chest pain, palpitations, orthopnea, claudication, leg swelling and PND.  Gastrointestinal: Negative for  abdominal pain, blood in stool, heartburn, melena, nausea and vomiting.  Genitourinary: Negative for hematuria.  Musculoskeletal: Positive for falls. Negative for myalgias.  Skin: Negative for rash.  Neurological: Positive for loss of consciousness and weakness. Negative for dizziness, tingling, tremors, sensory change, speech change, focal weakness, seizures and headaches.  Endo/Heme/Allergies: Does not bruise/bleed easily.  Psychiatric/Behavioral: Negative for substance abuse. The patient is  not nervous/anxious.   All other systems reviewed and are negative.     Physical Exam/Data:   Vitals:   09/18/19 0300 09/18/19 0400 09/18/19 0533 09/18/19 0700  BP: (!) 175/67 (!) 150/55  136/70  Pulse:   69 77  Resp: (!) 21 19 14 15   Temp:      TempSrc:      SpO2:   100% 100%  Weight:        Intake/Output Summary (Last 24 hours) at 09/18/2019 1055 Last data filed at 09/18/2019 0225 Gross per 24 hour  Intake 1000 ml  Output --  Net 1000 ml   Filed Weights   09/17/19 2056  Weight: 53.5 kg   Body mass index is 19.64 kg/m.   Physical Exam: General: Elderly and frail appearing, in no acute distress. Head: Normocephalic, atraumatic, sclera non-icteric, no xanthomas, nares without discharge.  Neck: Negative for carotid bruits. JVD not elevated. Lungs: Clear bilaterally to auscultation without wheezes, rales, or rhonchi. Breathing is unlabored. Heart: RRR with S1 S2. No murmurs, rubs, or gallops appreciated. Abdomen: Soft, non-tender, non-distended with normoactive bowel sounds. No hepatomegaly. No rebound/guarding. No obvious abdominal masses. Msk:  Strength and tone appear normal for age. Extremities: No clubbing or cyanosis. No edema. Distal pedal pulses are 2+ and equal bilaterally. Neuro: Alert and oriented X 3. No facial asymmetry. No focal deficit. Moves all extremities spontaneously. Psych:  Responds to questions appropriately with a normal affect.   EKG:  The EKG was personally  reviewed and demonstrates: sinus tachycardia, 101 bpm, LAE, LVH, poor R wave progression along the precordial leads, no acute st/t changes Telemetry:  Telemetry was personally reviewed and demonstrates: SR  Weights: [employer]   09/17/19 2056  Weight: 53.5 kg    Relevant CV Studies:  2D Echo 09/2016: - Left ventricle: The cavity size was normal. Systolic function was   normal. The estimated ejection fraction was in the range of 55%   to 60%. Wall motion was normal; there were no regional wall   motion abnormalities. Doppler parameters are consistent with   abnormal left ventricular relaxation (grade 1 diastolic   dysfunction).  Impressions:  - There is an abnormal shadow in ascending aorta likely due to   artifact from surrouding calcifications. This can be evaluated   with CT imaging if there is clinical indication.  Laboratory Data:  Chemistry Recent Labs  Lab 09/17/19 2136  NA 138  K 4.8  CL 107  CO2 22  GLUCOSE 212*  BUN 35*  CREATININE 1.61*  CALCIUM 9.2  GFRNONAA 30*  GFRAA 35*  ANIONGAP 9    No results for input(s): PROT, ALBUMIN, AST, ALT, ALKPHOS, BILITOT in the last 168 hours. Hematology Recent Labs  Lab 09/17/19 2136  WBC 11.9*  RBC 3.85*  HGB 12.9  HCT 38.2  MCV 99.2  MCH 33.5  MCHC 33.8  RDW 12.6  PLT 167   Cardiac EnzymesNo results for input(s): TROPONINI in the last 168 hours. No results for input(s): TROPIPOC in the last 168 hours.  BNPNo results for input(s): BNP, PROBNP in the last 168 hours.  DDimer No results for input(s): DDIMER in the last 168 hours.  Radiology/Studies:  CT Head Wo Contrast  Result Date: 09/17/2019 IMPRESSION: Atrophy, chronic microvascular disease. No acute intracranial abnormality. Electronically Signed   By: Rolm Baptise M.D.   On: 09/17/2019 21:57   MR BRAIN WO CONTRAST  Result Date: 09/18/2019 IMPRESSION: 1. No acute intracranial abnormality. 2. Generalized age-related cerebral  atrophy with  mild-to-moderate chronic microvascular ischemic disease. Electronically Signed   By: Jeannine Boga M.D.   On: 09/18/2019 00:21    Assessment and Plan:   1. Elevated HS-Tn: -Minimally elevated and not consistent with ACS -No need for further cycling of high-sensitivity troponin as her value has plateaued and she is without symptoms -Likely supply demand ischemia in the setting of AKI/dehydration  -Check echo -Consider outpatient Lexiscan if she would like if echo is unrevealing  -No plans for inpatient ischemic evaluation at this time -No indication for heparin gtt -ASA  2. Syncope: -Possibly in the setting of dehydration with labs revealing a prerenal state, cannot exclude hypoglycemia based on poor p.o. intake and history of hypoglycemic related syncope, versus worsening carotid artery disease, versus arrhythmia -Less likely PE given heart rates in the 70s bpm and oxygen saturations of 100% on room air, await echo as below -Monitor on telemetry -If no significant arrhythmia or pause is noted inpatient, plan for Zio patch -Carotid artery ultrasound -Echo -Check orthostatics prior to starting IV fluids -Gentle IV hydration -No driving x 6 months  3. Carotid artery disease: -Check carotid artery ultrasound given syncopal episode -ASA, Crestor, fenofibrate   4. Acute on chronic CKD stage III: -Likely ATN in the setting of dehydration -Recommend gentle IV hydration per IM -Likely contributing to her syncope as above -Will need to increase PO water intake at home, she only drinks 3 bottles of water daily meals and nothing in between   5. HTN: -Blood pressure is reasonably controlled -PTA amlodipine   6. HLD: -LDL of 64 from 08/2019 -Remains on Crestor and fenofibrate   7. Leukocytosis: -Per IM  8. Diabetes: -Uncertain if hypoglycemia played a role in her episode, as she has a history of hypoglycemic syncope -Per IM   For questions or updates, please contact [place] Please consult [email or web address] for contact info under Cardiology/STEMI.   Signed, Laurie Faith, PA-C [place] Pager: [phone] 09/18/2019, 10:55 AM

## 2019-09-18 NOTE — ED Notes (Signed)
Patient up to bedside commode with minimal assistance.

## 2019-09-18 NOTE — H&P (Addendum)
History and Physical    Laurie Harris [id number] DOB: 10-01-1940 DOA: 09/17/2019  Referring MD/NP/PA:   PCP: [name], MD   Patient coming from:  The patient is coming from home.  At baseline, pt is independent for most of ADL.        Chief Complaint: syncope  HPI: Laurie Harris is a 79 y.o. female with medical history significant of hypertension, hyperlipidemia, diabetes mellitus, GERD, hemorrhagic stroke in 2016, CKD stage III, carotid artery stenosis, syncope, who presents with syncope.  Pt states that she had some dizziness and passed out last night. No injury. She states that she had funny feeling in her head prior to the episode. No unilateral weakness or numbness or tingling his extremities.  No facial droop or slurred speech.  Patient has mild dry cough, but no chest pain, shortness breath, fever or chills.  No nausea vomiting, diarrhea, abdominal pain, symptoms of UTI. Pt has positive orthostatic vitals in ED per RN report.  ED Course: pt was found to have Trop 27 -->95 -->40, WBC 11.9, negative RVP for COVID-19, worsening renal function, temperature normal, blood pressure 183/73, 136/70, tachycardia, oxygen saturation 99% on room air. CT scan head showed atrophy with chronic microvascular disease. MRI brain showedchronic microvascular ischemic disease and no stroke. Patient is placed on MedSurg bed for observation.  Cardiology, Dr. Rockey Situ was consulted   Review of Systems:   General: no fevers, chills, no body weight gain, has fatigue HEENT: no blurry vision, hearing changes or sore throat Respiratory: no dyspnea, has mild coughing, no wheezing CV: no chest pain, no palpitations GI: no nausea, vomiting, abdominal pain, diarrhea, constipation GU: no dysuria, burning on urination, increased urinary frequency, hematuria  Ext: no leg edema Neuro: no unilateral weakness, numbness, or tingling, no vision change or hearing loss. Has dizziness and syncope Skin: no  rash, no skin tear. MSK: No muscle spasm, no deformity, no limitation of range of movement in spin Heme: No easy bruising.  Travel history: No recent long distant travel.  Allergy:  Allergies  Allergen Reactions  . Warfarin And Related     Possible stroke    Past Medical History:  Diagnosis Date  . Carotid arterial disease ([place])    a. 09/2016 Carotid U/S: R carotid bifurcation dzs of 50-69%, L carotid bifurcation dzs of <50%. Patent vertebral arteries w/ anegrade flow.  . Cerebellar hemorrhage ([place]) 03/16/2015   Left Cerebellar Hemorrhage- See Care Everywhere Seen by [facility]    . Chicken pox   . CKD (chronic kidney disease), stage III    functioning at 46%  . Diabetes mellitus without complication ([place])   . Hemorrhagic stroke ([place]) 12/31/2014   a. in the setting of warfarin therapy.  . Hyperlipidemia   . Hypertension   . Syncope    a. 09/2016 Echo: EF 55-60%, no rwma, Gr1 DD.    Past Surgical History:  Procedure Laterality Date  . ABCESS DRAINAGE  06/14/2016   appendix  . APPENDECTOMY  06/13/2016  . CATARACT EXTRACTION W/PHACO Left 09/04/2017   Procedure: CATARACT EXTRACTION PHACO AND INTRAOCULAR LENS PLACEMENT ([place]) COMPLICATED LEFT DIABETIC;  Surgeon: Leandrew Koyanagi, MD;  Location: [place];  Service: Ophthalmology;  Laterality: Left;  Diabetic - insulin  . CATARACT EXTRACTION W/PHACO Right 10/02/2017   Procedure: CATARACT EXTRACTION PHACO AND INTRAOCULAR LENS PLACEMENT ([place]) COMPLICATED  RIGHT DIABETIC;  Surgeon: Leandrew Koyanagi, MD;  Location: [place];  Service: Ophthalmology;  Laterality: Right;  Diabetic -  insulin  . CHOLECYSTECTOMY N/A 06/17/2018   Procedure: LAPAROSCOPIC CHOLECYSTECTOMY;  Surgeon: Benjamine Sprague, DO;  Location: [facility];  Service: General;  Laterality: N/A;  . COLONOSCOPY    . NECK SURGERY      Social History:  reports that she has never smoked. She has never used smokeless tobacco. She reports that she does  not drink alcohol or use drugs.  Family History:  Family History  Problem Relation Age of Onset  . Diabetes Mother   . Dementia Mother   . Heart disease Mother   . Cancer Mother        lung  . Cancer Father        colon  . Stroke Daughter   . Hyperlipidemia Daughter   . Breast cancer Neg Hx      Prior to Admission medications   Medication Sig Start Date End Date Taking? Authorizing Provider  aspirin EC 81 MG tablet Take 81 mg by mouth daily.   Yes [provider]  benazepril (LOTENSIN) 5 MG tablet TAKE ONE TABLET EVERY DAY 05/12/19  Yes [name], MD  Calcium Carbonate (CALCIUM 600 PO) Take 1 tablet by mouth daily.    Yes [provider]  cholecalciferol (VITAMIN D) 1000 units tablet Take 2,000 Units by mouth daily.   Yes [provider]  fenofibrate 160 MG tablet TAKE ONE TABLET BY MOUTH EVERY DAY 09/16/19  Yes [name], MD  Insulin Lispro Prot & Lispro (HUMALOG MIX 75/25 KWIKPEN) (75-25) 100 UNIT/ML Kwikpen INJECT 17 UNITS TWICE DAILY AS DIRECTED 09/16/19  Yes [name], MD  Multiple Vitamin (MULTIVITAMIN WITH MINERALS) TABS tablet Take 1 tablet by mouth daily.   Yes [provider]  NOVOFINE PLUS 32G X 4 MM MISC USE TWICE A DAY AS DIRECTED 04/06/19  Yes [name], MD  Omega-3 Fatty Acids (FISH OIL) 1200 MG CAPS Take 1,200 mg by mouth daily.   Yes [provider]  pantoprazole (PROTONIX) 40 MG tablet TAKE ONE TABLET BY MOUTH EVERY DAY 01/09/19  Yes [name], MD  rosuvastatin (CRESTOR) 40 MG tablet TAKE ONE TABLET EVERY DAY 08/11/19  Yes [name], MD  docusate sodium (COLACE) 100 MG capsule Take 1 capsule (100 mg total) by mouth 2 (two) times daily as needed for mild constipation. Patient not taking: Reported on 09/18/2019 06/19/18   Benjamine Sprague, DO  insulin lispro protamine-lispro (HUMALOG 75/25 MIX) (75-25) 100 UNIT/ML SUSP injection Inject into the skin. Inject 19 units in the morning and  18 at night    [provider]  polyethylene glycol powder (GLYCOLAX/MIRALAX) powder Take 17 g by mouth daily as needed for mild constipation. Patient not taking: Reported on 09/18/2019 07/29/18   [name], MD  triamcinolone cream (KENALOG) 0.1 % Apply 1 application topically 2 (two) times daily. Patient not taking: Reported on 09/18/2019 08/12/19   [name], MD    Physical Exam: Vitals:   09/18/19 1200 09/18/19 1230 09/18/19 1300 09/18/19 1400  BP: (!) 144/63 (!) 142/74 (!) 154/68 (!) 150/62  Pulse: 66 74 73 73  Resp: 15 18 18 15   Temp:      TempSrc:      SpO2: 99% 100% 99% 99%  Weight:       General: Not in acute distress HEENT:       Eyes: PERRL, EOMI, no scleral icterus.       ENT: No discharge from the ears and nose, no pharynx  injection, no tonsillar enlargement.        Neck: No JVD, no bruit, no mass felt. Heme: No neck lymph node enlargement. Cardiac: S1/S2, RRR, No murmurs, No gallops or rubs. Respiratory: No rales, wheezing, rhonchi or rubs. GI: Soft, nondistended, nontender, no rebound pain, no organomegaly, BS present. GU: No hematuria Ext: No pitting leg edema bilaterally. 2+DP/PT pulse bilaterally. Musculoskeletal: No joint deformities, No joint redness or warmth, no limitation of ROM in spin. Skin: No rashes.  Neuro: Alert, oriented X3, cranial nerves II-XII grossly intact, moves all extremities normally. Muscle strength 5/5 in all extremities, sensation to light touch intact. Brachial reflex 2+ bilaterally.  Psych: Patient is not psychotic, no suicidal or hemocidal ideation.  Labs on Admission: I have personally reviewed following labs and imaging studies  CBC: Recent Labs  Lab 09/17/19 2136  WBC 11.9*  NEUTROABS 9.7*  HGB 12.9  HCT 38.2  MCV 99.2  PLT [id number]   Basic Metabolic Panel: Recent Labs  Lab 09/17/19 2136  NA 138  K 4.8  CL 107  CO2 22  GLUCOSE 212*  BUN 35*  CREATININE 1.61*  CALCIUM 9.2   GFR: Estimated  Creatinine Clearance: 23.5 mL/min (A) (by C-G formula based on SCr of 1.61 mg/dL (H)). Liver Function Tests: No results for input(s): AST, ALT, ALKPHOS, BILITOT, PROT, ALBUMIN in the last 168 hours. No results for input(s): LIPASE, AMYLASE in the last 168 hours. No results for input(s): AMMONIA in the last 168 hours. Coagulation Profile: No results for input(s): INR, PROTIME in the last 168 hours. Cardiac Enzymes: No results for input(s): CKTOTAL, CKMB, CKMBINDEX, TROPONINI in the last 168 hours. BNP (last 3 results) No results for input(s): PROBNP in the last 8760 hours. HbA1C: No results for input(s): HGBA1C in the last 72 hours. CBG: Recent Labs  Lab 09/18/19 0721 09/18/19 1846  GLUCAP 109* 248*   Lipid Profile: No results for input(s): CHOL, HDL, LDLCALC, TRIG, CHOLHDL, LDLDIRECT in the last 72 hours. Thyroid Function Tests: No results for input(s): TSH, T4TOTAL, FREET4, T3FREE, THYROIDAB in the last 72 hours. Anemia Panel: No results for input(s): VITAMINB12, FOLATE, FERRITIN, TIBC, IRON, RETICCTPCT in the last 72 hours. Urine analysis:    Component Value Date/Time   COLORURINE YELLOW (A) 09/17/2019 2229   APPEARANCEUR CLEAR (A) 09/17/2019 2229   APPEARANCEUR Clear 12/31/2014 1822   LABSPEC 1.014 09/17/2019 2229   LABSPEC 1.009 12/31/2014 1822   PHURINE 5.0 09/17/2019 2229   GLUCOSEU 150 (A) 09/17/2019 2229   GLUCOSEU >=500 12/31/2014 1822   HGBUR NEGATIVE 09/17/2019 2229   BILIRUBINUR NEGATIVE 09/17/2019 2229   BILIRUBINUR Negative 12/31/2014 1822   KETONESUR NEGATIVE 09/17/2019 2229   PROTEINUR NEGATIVE 09/17/2019 2229   NITRITE NEGATIVE 09/17/2019 2229   LEUKOCYTESUR TRACE (A) 09/17/2019 2229   LEUKOCYTESUR Negative 12/31/2014 1822   Sepsis Labs: @LABRCNTIP (procalcitonin:4,lacticidven:4) ) Recent Results (from the past 240 hour(s))  Respiratory Panel by RT PCR (Flu A&B, Covid) - Nasopharyngeal Swab     Status: None   Collection Time: 09/18/19  2:20 AM    Specimen: Nasopharyngeal Swab  Result Value Ref Range Status   SARS Coronavirus 2 by RT PCR NEGATIVE NEGATIVE Final    Comment: (NOTE) SARS-CoV-2 target nucleic acids are NOT DETECTED. The SARS-CoV-2 RNA is generally detectable in upper respiratoy specimens during the acute phase of infection. The lowest concentration of SARS-CoV-2 viral copies this assay can detect is 131 copies/mL. A negative result does not preclude SARS-Cov-2 infection and should not be used as  the sole basis for treatment or other patient management decisions. A negative result may occur with  improper specimen collection/handling, submission of specimen other than nasopharyngeal swab, presence of viral mutation(s) within the areas targeted by this assay, and inadequate number of viral copies (<131 copies/mL). A negative result must be combined with clinical observations, patient history, and epidemiological information. The expected result is Negative. Fact Sheet for Patients:  PinkCheek.be Fact Sheet for Healthcare Providers:  GravelBags.it This test is not yet ap proved or cleared by the [place] FDA and  has been authorized for detection and/or diagnosis of SARS-CoV-2 by FDA under an Emergency Use Authorization (EUA). This EUA will remain  in effect (meaning this test can be used) for the duration of the COVID-19 declaration under Section 564(b)(1) of the Act, 21 U.S.C. section 360bbb-3(b)(1), unless the authorization is terminated or revoked sooner.    Influenza A by PCR NEGATIVE NEGATIVE Final   Influenza B by PCR NEGATIVE NEGATIVE Final    Comment: (NOTE) The Xpert Xpress SARS-CoV-2/FLU/RSV assay is intended as an aid in  the diagnosis of influenza from Nasopharyngeal swab specimens and  should not be used as a sole basis for treatment. Nasal washings and  aspirates are unacceptable for Xpert Xpress SARS-CoV-2/FLU/RSV  testing. Fact Sheet  for Patients: PinkCheek.be Fact Sheet for Healthcare Providers: GravelBags.it This test is not yet approved or cleared by the [place] FDA and  has been authorized for detection and/or diagnosis of SARS-CoV-2 by  FDA under an Emergency Use Authorization (EUA). This EUA will remain  in effect (meaning this test can be used) for the duration of the  Covid-19 declaration under Section 564(b)(1) of the Act, 21  U.S.C. section 360bbb-3(b)(1), unless the authorization is  terminated or revoked. Performed at [facility], [address] [place], [place] [address]      Radiological Exams on Admission: CT Head Wo Contrast  Result Date: 09/17/2019 CLINICAL DATA:  Dizziness EXAM: CT HEAD WITHOUT CONTRAST TECHNIQUE: Contiguous axial images were obtained from the base of the skull through the vertex without intravenous contrast. COMPARISON:  08/23/2019 FINDINGS: Brain: There is atrophy and chronic small vessel disease changes. No acute intracranial abnormality. Specifically, no hemorrhage, hydrocephalus, mass lesion, acute infarction, or significant intracranial injury. Vascular: No hyperdense vessel or unexpected calcification. Skull: No acute calvarial abnormality. Sinuses/Orbits: Visualized paranasal sinuses and mastoids clear. Orbital soft tissues unremarkable. Other: None IMPRESSION: Atrophy, chronic microvascular disease. No acute intracranial abnormality. Electronically Signed   By: Rolm Baptise M.D.   On: 09/17/2019 21:57   MR BRAIN WO CONTRAST  Result Date: 09/18/2019 CLINICAL DATA:  Initial evaluation for acute ataxia, dizziness. EXAM: MRI HEAD WITHOUT CONTRAST TECHNIQUE: Multiplanar, multiecho pulse sequences of the brain and surrounding structures were obtained without intravenous contrast. COMPARISON:  Prior head CT from earlier the same day. FINDINGS: Brain: Examination mildly degraded by motion artifact. Generalized  age-related cerebral atrophy. Patchy T2/FLAIR hyperintensity within the periventricular and deep white matter both cerebral hemispheres most consistent with chronic small vessel ischemic disease, mild to moderate in nature. Patchy involvement of the pons noted. No abnormal foci of restricted diffusion to suggest acute or subacute ischemia. Gray-white matter differentiation maintained. No areas of remote cortical infarction identified. No evidence for acute intracranial hemorrhage. Single linear focus of susceptibility artifact noted at the left paramedian cerebellar vermis, nonspecific, but suggestive of small focus of chronic hemorrhage, of doubtful significance in the acute setting. No other evidence for chronic intracranial hemorrhage. No mass lesion,  midline shift or mass effect. No hydrocephalus. No extra-axial fluid collection. Pituitary gland and suprasellar region within normal limits. Midline structures intact. Vascular: Major intracranial vascular flow voids are maintained. Skull and upper cervical spine: Craniocervical junction within normal limits. Bone marrow signal intensity normal. No scalp soft tissue abnormality. Sinuses/Orbits: Patient status post bilateral ocular lens replacement. Globes and orbital soft tissues demonstrate no acute finding. Paranasal sinuses are largely clear. No mastoid effusion. Inner ear structures normal. Other: None. IMPRESSION: 1. No acute intracranial abnormality. 2. Generalized age-related cerebral atrophy with mild-to-moderate chronic microvascular ischemic disease. Electronically Signed   By: Jeannine Boga M.D.   On: 09/18/2019 00:21   ECHOCARDIOGRAM COMPLETE  Result Date: 09/18/2019   ECHOCARDIOGRAM REPORT   Patient Name:   Laurie Harris Date of Exam: 09/18/2019 Medical Rec #:  [id number]       Height:       65.0 in Accession #:    [id number]      Weight:       118.0 lb Date of Birth:  1940-09-17       BSA:          1.58 m Patient Age:    29 years        BP:            136/70 mmHg Patient Gender: F               HR:           77 bpm. Exam Location:  [facility] Procedure: 2D Echo, Color Doppler and Cardiac Doppler Indications:     Stroke 434.91  History:         Patient has prior history of Echocardiogram examinations, most                  recent 09/27/2016. Signs/Symptoms:Syncope; Risk                  Factors:Hypertension and Diabetes.  Sonographer:     Sherrie Sport RDCS (AE) Referring Phys:  [place] Diagnosing Phys: Ida Rogue MD IMPRESSIONS  1. Left ventricular ejection fraction, by visual estimation, is 60 to 65%. The left ventricle has normal function. There is mildly increased left ventricular hypertrophy.  2. Left ventricular diastolic parameters are consistent with Grade I diastolic dysfunction (impaired relaxation).  3. The left ventricle has no regional wall motion abnormalities.  4. Global right ventricle has normal systolic function.The right ventricular size is normal. No increase in right ventricular wall thickness.  5. Left atrial size was normal.  6. The aortic valve is normal in structure. Aortic valve regurgitation is not visualized. Mild to moderate aortic valve sclerosis/calcification without any evidence of aortic stenosis.  7. TR signal is inadequate for assessing pulmonary artery systolic pressure. FINDINGS  Left Ventricle: Left ventricular ejection fraction, by visual estimation, is 60 to 65%. The left ventricle has normal function. The left ventricle has no regional wall motion abnormalities. There is mildly increased left ventricular hypertrophy. Left ventricular diastolic parameters are consistent with Grade I diastolic dysfunction (impaired relaxation). Normal left atrial pressure. Right Ventricle: The right ventricular size is normal. No increase in right ventricular wall thickness. Global RV systolic function is has normal systolic function. Left Atrium: Left atrial size was normal in size. Right Atrium: Right atrial size was  normal in size Pericardium: There is no evidence of pericardial effusion. Mitral Valve: The mitral valve is normal in structure. No evidence of mitral valve regurgitation. No evidence  of mitral valve stenosis by observation. Tricuspid Valve: The tricuspid valve is normal in structure. Tricuspid valve regurgitation is not demonstrated. Aortic Valve: The aortic valve is normal in structure. Aortic valve regurgitation is not visualized. Mild to moderate aortic valve sclerosis/calcification is present, without any evidence of aortic stenosis. Aortic valve mean gradient measures 3.7 mmHg. Aortic valve peak gradient measures 6.0 mmHg. Aortic valve area, by VTI measures 2.19 cm. Pulmonic Valve: The pulmonic valve was normal in structure. Pulmonic valve regurgitation is not visualized. Pulmonic regurgitation is not visualized. Aorta: The aortic root, ascending aorta and aortic arch are all structurally normal, with no evidence of dilitation or obstruction. Venous: The inferior vena cava is normal in size with greater than 50% respiratory variability, suggesting right atrial pressure of 3 mmHg. IAS/Shunts: No atrial level shunt detected by color flow Doppler. There is no evidence of a patent foramen ovale. No ventricular septal defect is seen or detected. There is no evidence of an atrial septal defect.  LEFT VENTRICLE PLAX 2D LVIDd:         3.70 cm  Diastology LVIDs:         2.40 cm  LV e' lateral:   9.68 cm/s LV PW:         1.24 cm  LV E/e' lateral: 4.9 LV IVS:        1.04 cm  LV e' medial:    6.09 cm/s LVOT diam:     2.00 cm  LV E/e' medial:  7.8 LV SV:         38 ml LV SV Index:   24.30 LVOT Area:     3.14 cm  RIGHT VENTRICLE RV Basal diam:  2.64 cm RV S prime:     15.40 cm/s TAPSE (M-mode): 3.0 cm LEFT ATRIUM             Index       RIGHT ATRIUM          Index LA diam:        3.10 cm 1.96 cm/m  RA Area:     7.30 cm LA Vol (A2C):   32.0 ml 20.24 ml/m RA Volume:   10.90 ml 6.90 ml/m LA Vol (A4C):   16.1 ml 10.18  ml/m LA Biplane Vol: 24.5 ml 15.50 ml/m  AORTIC VALVE                   PULMONIC VALVE AV Area (Vmax):    2.03 cm    PV Vmax:        0.41 m/s AV Area (Vmean):   1.90 cm    PV Peak grad:   0.7 mmHg AV Area (VTI):     2.19 cm    RVOT Peak grad: 1 mmHg AV Vmax:           122.33 cm/s AV Vmean:          86.500 cm/s AV VTI:            0.247 m AV Peak Grad:      6.0 mmHg AV Mean Grad:      3.7 mmHg LVOT Vmax:         79.10 cm/s LVOT Vmean:        52.200 cm/s LVOT VTI:          0.172 m LVOT/AV VTI ratio: 0.70  AORTA Ao Root diam: 2.80 cm MITRAL VALVE MV Area (PHT): 5.13 cm  SHUNTS MV PHT:        42.92 msec           Systemic VTI:  0.17 m MV Decel Time: 148 msec             Systemic Diam: 2.00 cm MV E velocity: 47.50 cm/s 103 cm/s MV A velocity: 91.70 cm/s 70.3 cm/s MV E/A ratio:  0.52       1.5  Ida Rogue MD Electronically signed by Ida Rogue MD Signature Date/Time: 09/18/2019/12:17:40 PM    Final      EKG: Independently reviewed.  Sinus rhythm, QTC 476, LAE, LAD, nonspecific T wave change.  Assessment/Plan Principal Problem:   Syncope Active Problems:   Type II diabetes mellitus with renal manifestations (HCC)   Hyperlipidemia   Acute renal failure superimposed on stage 3a chronic kidney disease (HCC)   Carotid artery disease (HCC)   Hypertension   Elevated troponin   Syncope: Etiology is not clear.  CT head and MRI of the brain are both negative for acute issues. Pt has positive orthostatic vitals in ED per RN report, which may have contributed partially.  Patient has mildly elevated troponin 27--> 93-- >40. Card was consulted.   -Place on tele bed for obs - Carotid doppler - 2d echo - Neuro checks  - on ASA - IVF: 1L of NS, then NS 75 cc/h - PT/OT eval and treat  Elevated troponin: Troponin elevated minimally.  Most likely due to demand ischemia. cardiology was consulted. -trend trop -check A1c, FLP -Repeat EKG in morning -Follow-up 2D echo  Type II diabetes  mellitus with renal manifestations ([place]): Last A1c 7.0 on 08/05/19. Patient is taking 75/25 insulin at home -will decrease 75/25 insulin dose from 17 to 12 units bid -SSI  Hyperlipidemia: -Fibrate and Crestor  Acute renal failure superimposed on stage 3a chronic kidney disease ([place]): Baseline Cre is 1.1-1.2, pt's Cre is 1.61 and BUN 35 on admission. Likely due to prerenal secondary to dehydration and continuation of ARB - IVF as above - Follow up renal function by BMP - Avoid using renal toxic medications, hypotension and contrast dye (or carefully use) - Hold lotensin  Hx of Carotid artery disease ([place]): -f/u carotid Doppler -on ASA and statin  Essential hypertension: -prn Hydralazine -hold Lotensin -Start amlodipine 5 mg daily    DVT ppx: SQ Lovenox Code Status: DNR (I discussed with patient, and explained the meaning of CODE STATUS. Patient wants to be DNR) Family Communication: None at bed side.   Disposition Plan:  Anticipate discharge back to previous home environment Consults called: Cardiologist, Dr. Rockey Situ Admission status: Med-surg bed for obs     Date of Service 09/18/2019    [facility]   If 7PM-7AM, please contact night-coverage [email or web address] Password [facility] 09/18/2019, 7:21 PM

## 2019-09-18 NOTE — ED Notes (Signed)
Patient went to ultrasound.  She was eating prior to going, but said the food was cold.  I called dietary and asked for a new tray since th order was just put in and they will send it.

## 2019-09-18 NOTE — ED Notes (Signed)
Wells Guiles RN, aware of bed assigned 88

## 2019-09-18 NOTE — Telephone Encounter (Signed)
Order placed for zio AT per Christell Faith, PA request.   Will call Monday to review instructions.

## 2019-09-19 ENCOUNTER — Encounter: Payer: Self-pay | Admitting: Internal Medicine

## 2019-09-19 DIAGNOSIS — E7800 Pure hypercholesterolemia, unspecified: Secondary | ICD-10-CM

## 2019-09-19 DIAGNOSIS — I6523 Occlusion and stenosis of bilateral carotid arteries: Secondary | ICD-10-CM

## 2019-09-19 LAB — LIPID PANEL
Cholesterol: 135 mg/dL (ref 0–200)
HDL: 41 mg/dL (ref >40)
LDL Cholesterol: 52 mg/dL (ref 0–99)
Total CHOL/HDL Ratio: 3.3 RATIO
Triglycerides: 208 mg/dL — ABNORMAL HIGH (ref <150)
VLDL: 42 mg/dL — ABNORMAL HIGH (ref 0–40)

## 2019-09-19 LAB — CBC
HCT: 36.3 % (ref 36.0–46.0)
Hemoglobin: 12.1 g/dL (ref 12.0–15.0)
MCH: 33.1 pg (ref 26.0–34.0)
MCHC: 33.3 g/dL (ref 30.0–36.0)
MCV: 99.2 fL (ref 80.0–100.0)
Platelets: 173 10*3/uL (ref 150–400)
RBC: 3.66 MIL/uL — ABNORMAL LOW (ref 3.87–5.11)
RDW: 12.5 % (ref 11.5–15.5)
WBC: 7.1 10*3/uL (ref 4.0–10.5)
nRBC: 0 % (ref 0.0–0.2)

## 2019-09-19 LAB — BASIC METABOLIC PANEL
Anion gap: 7 (ref 5–15)
BUN: 30 mg/dL — ABNORMAL HIGH (ref 8–23)
CO2: 21 mmol/L — ABNORMAL LOW (ref 22–32)
Calcium: 9.2 mg/dL (ref 8.9–10.3)
Chloride: 114 mmol/L — ABNORMAL HIGH (ref 98–111)
Creatinine, Ser: 0.96 mg/dL (ref 0.44–1.00)
GFR calc Af Amer: >60 mL/min (ref >60)
GFR calc non Af Amer: 56 mL/min — ABNORMAL LOW (ref >60)
Glucose, Bld: 101 mg/dL — ABNORMAL HIGH (ref 70–99)
Potassium: 4.9 mmol/L (ref 3.5–5.1)
Sodium: 142 mmol/L (ref 135–145)

## 2019-09-19 LAB — GLUCOSE, CAPILLARY
Glucose-Capillary: 100 mg/dL — ABNORMAL HIGH (ref 70–99)
Glucose-Capillary: 110 mg/dL — ABNORMAL HIGH (ref 70–99)
Glucose-Capillary: 114 mg/dL — ABNORMAL HIGH (ref 70–99)

## 2019-09-19 LAB — HEMOGLOBIN A1C
Hgb A1c MFr Bld: 6.8 % — ABNORMAL HIGH (ref 4.8–5.6)
Mean Plasma Glucose: 148.46 mg/dL

## 2019-09-19 LAB — URINE CULTURE: Culture: <10000 — AB

## 2019-09-19 MED ORDER — BENAZEPRIL HCL 5 MG PO TABS
5.0000 mg | ORAL_TABLET | Freq: Every day | ORAL | Status: DC
  Administered 2019-09-19: 5 mg via ORAL
  Filled 2019-09-19: qty 1

## 2019-09-19 MED ORDER — MECLIZINE HCL 12.5 MG PO TABS: 12.5000 mg | ORAL_TABLET | Freq: Three times a day (TID) | ORAL | 0 refills | Status: DC | PRN

## 2019-09-19 NOTE — Evaluation (Signed)
Occupational Therapy Evaluation Patient Details Name: Laurie Harris MRN: [id number] DOB: Jun 14, 1941 Today's Date: 09/19/2019    History of Present Illness 79 yo female with onset of syncopal episode was admitted, noted possible UTI and had elevated troponin.  PMHx:  carotid stenosis, syncope, HTN, DM, CKD3, GERD, HLD, hemorrhagic CVA   Clinical Impression   Patient seen this date for OT evaluation, patient able to demonstrate dressing skills with modified independence, toileting with modified independence. Transfers with modified independence.  Patient does not have any additional OT needs and no follow up at discharge. She is in the process of discharge home with her daughter.     Follow Up Recommendations  No OT follow up    Equipment Recommendations       Recommendations for Other Services       Precautions / Restrictions Precautions Precautions: Fall Precaution Comments: monitor O2 sat monitor and telemetry Restrictions Weight Bearing Restrictions: No      Mobility Bed Mobility Overal bed mobility: Independent Bed Mobility: Supine to Sit     Supine to sit: Min assist     General bed mobility comments: min to support trunk and pt can scoot out  Transfers Overall transfer level: Modified independent Equipment used: 1 person hand held assist Transfers: Sit to/from Stand Sit to Stand: Min guard         General transfer comment: pt understands hand placement inportance, safety is fairly good with all trnasfers    Balance Overall balance assessment: Mild deficits observed, not formally tested Sitting-balance support: Feet supported Sitting balance-Leahy Scale: Good     Standing balance support: Bilateral upper extremity supported;During functional activity Standing balance-Leahy Scale: Fair                             ADL either performed or assessed with clinical judgement   ADL Overall ADL's : Modified independent                                        General ADL Comments: Patient able to dress herself including socks and shoes prior to discharge this date.     Vision Baseline Vision/History: Wears glasses Patient Visual Report: No change from baseline       Perception     Praxis      Pertinent Vitals/Pain Pain Assessment: No/denies pain     Hand Dominance Right   Extremity/Trunk Assessment Upper Extremity Assessment Upper Extremity Assessment: Overall WFL for tasks assessed   Lower Extremity Assessment Lower Extremity Assessment: Defer to PT evaluation   Cervical / Trunk Assessment Cervical / Trunk Assessment: Kyphotic   Communication Communication Communication: No difficulties   Cognition Arousal/Alertness: Awake/alert Behavior During Therapy: WFL for tasks assessed/performed Overall Cognitive Status: Within Functional Limits for tasks assessed                                     General Comments  Pt had O2 sats of 100% with resting ck on room air, and then recheck after gait was 100%    Exercises Exercises: Other exercises(4- LE strength)   Shoulder Instructions      Home Living Family/patient expects to be discharged to:: Private residence Living Arrangements: Children Available Help at Discharge: Family;Available 24 hours/day Type of Home: House  Home Access: Stairs to enter Entrance Stairs-Number of Steps: 5 Entrance Stairs-Rails: Can reach both Home Layout: One level     Bathroom Shower/Tub: Door;Walk-in Psychologist, prison and probation services: Standard     Home Equipment: Environmental consultant - 2 wheels;Cane - single point   Additional Comments: has been living with daughter who works from home      Prior Functioning/Environment Level of Independence: Independent        Comments: has not recently used her AD's        OT Problem List: Decreased strength      OT Treatment/Interventions:      OT Goals(Current goals can be found in the care plan section) Acute  Rehab OT Goals Patient Stated Goal: to go home OT Goal Formulation: With patient Time For Goal Achievement: 09/26/19 Potential to Achieve Goals: Good  OT Frequency:     Barriers to D/C:            Co-evaluation              AM-PAC OT "6 Clicks" Daily Activity     Outcome Measure Help from another person eating meals?: None Help from another person taking care of personal grooming?: None Help from another person toileting, which includes using toliet, bedpan, or urinal?: None Help from another person bathing (including washing, rinsing, drying)?: None Help from another person to put on and taking off regular upper body clothing?: None Help from another person to put on and taking off regular lower body clothing?: None 6 Click Score: 24   End of Session Equipment Utilized During Treatment: Gait belt  Activity Tolerance: Patient tolerated treatment well Patient left: in chair;with call bell/phone within reach;with nursing/sitter in room  OT Visit Diagnosis: Muscle weakness (generalized) (M62.81)                Time: [id number] OT Time Calculation (min): 17 min Charges:  OT General Charges $OT Visit: 1 Visit  Laurie Harris, OTR/L, CLT   Laurie Harris 09/19/2019, 2:32 PM

## 2019-09-19 NOTE — Discharge Summary (Signed)
Physician Discharge Summary  Patient ID: Laurie Harris MRN: [id number] DOB/AGE: 81-Jan-1942 81 y.o.  Admit date: 09/17/2019 Discharge date: 09/19/2019  Admission Diagnoses:  Discharge Diagnoses:  Principal Problem:   Syncope Active Problems:   Type II diabetes mellitus with renal manifestations (HCC)   Hyperlipidemia   Acute renal failure superimposed on stage 3a chronic kidney disease (HCC)   Carotid artery disease (HCC)   Hypertension   Elevated troponin   Discharged Condition: fair  Hospital Course:  79 year old woman who lives with her daughter with history of syncope felt secondary to dehydration/vasovagal, as well as episodes of hypoglycemia, prior hemorrhagic stroke on warfarin, gait instability since 2016, moderate bilateral carotid disease, chronic kidney disease, history of mechanical falls who presents after episode of questionable syncope vs presyncope.   Syncope:  Questionable syncope versus presyncope  - etiology is not clear.  CT head and MRI of the brain are both negative for acute issues. -Per note pt has positive orthostatic vitals in ED; over repeat orthostatic vitals on floor, as recorded and not consistent with orthostatic hypotension or POTS  - Patient has mildly elevated troponin 27--> 93-- >40. Card was consulted.  Does not think any inpatient intervention or work-up is warranted at this point recommended outpatient ZIO monitoring.  Arranged by cardiology nurses per record.  Patient is to be called on this Monday with the monitor and instruction. -Encourage adequate p.o. hydration -Also strongly advised to avoid driving -Carotid ultrasound shows no new finding and less than 50% stenosis -Adequate IV hydration -Echo on 09/18/2019 shows 1. Left ventricular ejection fraction, by visual estimation, is 60 to 65%. The left ventricle has normal function. There is mildly increased left ventricular hypertrophy.  2. Left ventricular diastolic parameters are consistent  with Grade I diastolic dysfunction (impaired relaxation).  3. The left ventricle has no regional wall motion abnormalities.  4. Global right ventricle has normal systolic function.The right ventricular size is normal. No increase in right ventricular wall thickness. -Continue aspirin and statin  -Physical therapy evaluated and recommended home health care with PT.  Ordered.  Elevated troponin: Troponin elevated minimally.  Most likely due to demand ischemia. cardiology was consulted.  And did not recommend any inpatient work-up or intervention at this point in time as it is not warranted. -trend trop -check A1c found to be 6.8, FLP essentially normal except triglyceride of 208 -Repeat EKG in morning  Type II diabetes mellitus with renal manifestations ([place]): Remained stable.  Resume home medication and dose as before -Follow-up PCP   hyperlipidemia: -Fibrate and Crestor  Acute renal failure superimposed on stage 3a chronic kidney disease ([place]):  Resolved on discharge  Baseline Cre is 1.1-1.2, pt's Cre is 1.61 and BUN 35 on admission. Likely due to prerenal secondary to dehydration and continuation of ACE inhibitor -Resume ACE inhibitor on discharge since patient's renal function improved to baseline - IVF as above  Hx of Carotid artery disease ([place]): -Carotid Doppler is essentially same as it was before with less than 50% stenosis -on ASA and statin -Up outpatient  Essential hypertension: Mostly stable -Resume home meds  Consults: cardiology  Significant Diagnostic Studies: Radiologic imaging, echo and blood work-up  Treatments: As per hospital course and discharge med list  Discharge Exam: Blood pressure (!) 170/69, pulse 72, temperature 99.3 F (37.4 C), temperature source Oral, resp. rate 19, weight 53.5 kg, SpO2 99 %.  HEENT:       Eyes: PERRL, EOMI, no scleral icterus.  ENT: No discharge from the ears and nose, no pharynx injection, no tonsillar enlargement.         Neck: No JVD, no bruit, no mass felt. Heme: No neck lymph node enlargement. Cardiac: S1/S2, RRR, No murmurs, No gallops or rubs. Respiratory: No rales, wheezing, rhonchi or rubs. GI: Soft, nondistended, nontender, no rebound pain, no organomegaly, BS present. GU: No hematuria Ext: No pitting leg edema bilaterally. 2+DP/PT pulse bilaterally. Musculoskeletal: No joint deformities, No joint redness or warmth, no limitation of ROM in spin. Skin: No rashes.  Neuro: Alert, oriented X3, cranial nerves II-XII grossly intact, moves all extremities normally. Muscle strength 5/5 in all extremities, sensation to light touch intact. Brachial reflex 2+ bilaterally.  Psych: Patient is not psychotic, no suicidal or hemocidal ideation.  Disposition: Discharge disposition: 06-Home-Health Care Svc     Stable to be discharged home with home health care physical therapy as per PT recommendation.  Also patient is advised to avoid driving or operating any heavy machinery's.  Advised patient to follow-up with cardiology as outpatient cardiology was supposed to call patient with Zio monitor instruction on this Monday. -Signs and symptoms given to patient when she must seek immediate medical attention.  Patient expressed understanding.  Discharge Instructions    Call MD for:  persistant dizziness or light-headedness   Complete by: As directed    Diet - low sodium heart healthy   Complete by: As directed    Increase activity slowly   Complete by: As directed    No driving as per cardiology recommendation until cleared by cardiology   Walk with assistance   Complete by: As directed      Allergies as of 09/19/2019      Reactions   Warfarin And Related    Possible stroke      Medication List    TAKE these medications   aspirin EC 81 MG tablet Take 81 mg by mouth daily.   benazepril 5 MG tablet Commonly known as: LOTENSIN TAKE ONE TABLET EVERY DAY   CALCIUM 600 PO Take 1 tablet by mouth  daily.   cholecalciferol 1000 units tablet Commonly known as: VITAMIN D Take 2,000 Units by mouth daily.   docusate sodium 100 MG capsule Commonly known as: COLACE Take 1 capsule (100 mg total) by mouth 2 (two) times daily as needed for mild constipation.   fenofibrate 160 MG tablet TAKE ONE TABLET BY MOUTH EVERY DAY   Fish Oil 1200 MG Caps Take 1,200 mg by mouth daily.   insulin lispro protamine-lispro (75-25) 100 UNIT/ML Susp injection Commonly known as: HUMALOG 75/25 MIX Inject into the skin. Inject 19 units in the morning and 18 at night   Insulin Lispro Prot & Lispro (75-25) 100 UNIT/ML Kwikpen Commonly known as: HumaLOG Mix 75/25 KwikPen INJECT 17 UNITS TWICE DAILY AS DIRECTED   meclizine 12.5 MG tablet Commonly known as: ANTIVERT Take 1 tablet (12.5 mg total) by mouth 3 (three) times daily as needed for dizziness.   multivitamin with minerals Tabs tablet Take 1 tablet by mouth daily.   NovoFine Plus 32G X 4 MM Misc Generic drug: Insulin Pen Needle USE TWICE A DAY AS DIRECTED   pantoprazole 40 MG tablet Commonly known as: PROTONIX TAKE ONE TABLET BY MOUTH EVERY DAY   polyethylene glycol powder 17 GM/SCOOP powder Commonly known as: GLYCOLAX/MIRALAX Take 17 g by mouth daily as needed for mild constipation.   rosuvastatin 40 MG tablet Commonly known as: CRESTOR TAKE ONE TABLET EVERY DAY  triamcinolone cream 0.1 % Commonly known as: KENALOG Apply 1 application topically 2 (two) times daily.        Signed: Thornell Mule 09/19/2019, 2:16 PM

## 2019-09-19 NOTE — Progress Notes (Signed)
Laurie Harris to be D/C'd Home per MD order.  Discussed prescriptions and follow up appointments with the patient. Prescriptions given to patient, medication list explained in detail. Pt verbalized understanding.  Allergies as of 09/19/2019      Reactions   Warfarin And Related    Possible stroke      Medication List    TAKE these medications   aspirin EC 81 MG tablet Take 81 mg by mouth daily.   benazepril 5 MG tablet Commonly known as: LOTENSIN TAKE ONE TABLET EVERY DAY   CALCIUM 600 PO Take 1 tablet by mouth daily.   cholecalciferol 1000 units tablet Commonly known as: VITAMIN D Take 2,000 Units by mouth daily.   docusate sodium 100 MG capsule Commonly known as: COLACE Take 1 capsule (100 mg total) by mouth 2 (two) times daily as needed for mild constipation.   fenofibrate 160 MG tablet TAKE ONE TABLET BY MOUTH EVERY DAY   Fish Oil 1200 MG Caps Take 1,200 mg by mouth daily.   insulin lispro protamine-lispro (75-25) 100 UNIT/ML Susp injection Commonly known as: HUMALOG 75/25 MIX Inject into the skin. Inject 19 units in the morning and 18 at night   Insulin Lispro Prot & Lispro (75-25) 100 UNIT/ML Kwikpen Commonly known as: HumaLOG Mix 75/25 KwikPen INJECT 17 UNITS TWICE DAILY AS DIRECTED   meclizine 12.5 MG tablet Commonly known as: ANTIVERT Take 1 tablet (12.5 mg total) by mouth 3 (three) times daily as needed for dizziness.   multivitamin with minerals Tabs tablet Take 1 tablet by mouth daily.   NovoFine Plus 32G X 4 MM Misc Generic drug: Insulin Pen Needle USE TWICE A DAY AS DIRECTED   pantoprazole 40 MG tablet Commonly known as: PROTONIX TAKE ONE TABLET BY MOUTH EVERY DAY   polyethylene glycol powder 17 GM/SCOOP powder Commonly known as: GLYCOLAX/MIRALAX Take 17 g by mouth daily as needed for mild constipation.   rosuvastatin 40 MG tablet Commonly known as: CRESTOR TAKE ONE TABLET EVERY DAY   triamcinolone cream 0.1 % Commonly known as:  KENALOG Apply 1 application topically 2 (two) times daily.       Vitals:   09/19/19 0935 09/19/19 1204  BP:  (!) 170/69  Pulse:  72  Resp:  19  Temp:  99.3 F (37.4 C)  SpO2: 100% 99%    Skin clean, dry and intact without evidence of skin break down, no evidence of skin tears noted. IV catheter discontinued intact. Site without signs and symptoms of complications. Dressing and pressure applied. Pt denies pain at this time. No complaints noted.  An After Visit Summary was printed and given to the patient. Patient escorted via [place], and D/C home via private auto.  Laurie Harris 09/19/2019 2:41 PM

## 2019-09-19 NOTE — Discharge Instructions (Signed)

## 2019-09-19 NOTE — Evaluation (Signed)
Physical Therapy Evaluation Patient Details Name: Laurie Harris MRN: [id number] DOB: 1941-07-24 Today's Date: 09/19/2019   History of Present Illness  79 yo female with onset of syncopal episode was admitted, noted possible UTI and had elevated troponin.  PMHx:  carotid stenosis, syncope, HTN, DM, CKD3, GERD, HLD, hemorrhagic CVA  Clinical Impression  Pt was seen with O2 sats monitored during entire session, and noted 100% sat with all movement and HR in 80's.  Her plan is to return home with daughter and have home therapy resume, and will need to try stairs to enter house before this plan is carried out.  Follow acutely for goals of therapy including endurance training and control of balance with stairs.    Follow Up Recommendations Home health PT;Supervision for mobility/OOB    Equipment Recommendations  None recommended by PT(has RW to use)    Recommendations for Other Services       Precautions / Restrictions Precautions Precautions: Fall Precaution Comments: monitor O2 sat monitor and telemetry Restrictions Weight Bearing Restrictions: No      Mobility  Bed Mobility Overal bed mobility: Needs Assistance Bed Mobility: Supine to Sit     Supine to sit: Min assist     General bed mobility comments: min to support trunk and pt can scoot out  Transfers Overall transfer level: Needs assistance Equipment used: 1 person hand held assist Transfers: Sit to/from Stand Sit to Stand: Min guard         General transfer comment: pt understands hand placement inportance, safety is fairly good with all trnasfers  Ambulation/Gait Ambulation/Gait assistance: Min guard Gait Distance (Feet): 80 Feet(40 x 2) Assistive device: IV Pole Gait Pattern/deviations: Step-through pattern;Narrow base of support;Trunk flexed;Shuffle;Decreased stride length Gait velocity: reduced Gait velocity interpretation: <1.31 ft/sec, indicative of household ambulator General Gait Details: hemmed in  minimally by IV pole but is min WB on UE's   Stairs Stairs: (tired by gait)          Wheelchair Mobility    Modified Rankin (Stroke Patients Only)       Balance Overall balance assessment: Needs assistance Sitting-balance support: Feet supported Sitting balance-Leahy Scale: Good     Standing balance support: Bilateral upper extremity supported;During functional activity Standing balance-Leahy Scale: Fair                               Pertinent Vitals/Pain Pain Assessment: No/denies pain    Home Living Family/patient expects to be discharged to:: Private residence Living Arrangements: Children Available Help at Discharge: Family;Available 24 hours/day Type of Home: House Home Access: Stairs to enter Entrance Stairs-Rails: Can reach both Entrance Stairs-Number of Steps: 5 Home Layout: One level Home Equipment: Walker - 2 wheels;Cane - single point Additional Comments: has been living with daughter who works from home    Prior Function Level of Independence: Independent         Comments: has not recently used her AD's     Hand Dominance   Dominant Hand: Right    Extremity/Trunk Assessment   Upper Extremity Assessment Upper Extremity Assessment: Overall WFL for tasks assessed    Lower Extremity Assessment Lower Extremity Assessment: Generalized weakness    Cervical / Trunk Assessment Cervical / Trunk Assessment: Kyphotic  Communication   Communication: No difficulties  Cognition Arousal/Alertness: Awake/alert Behavior During Therapy: WFL for tasks assessed/performed Overall Cognitive Status: Within Functional Limits for tasks assessed  General Comments General comments (skin integrity, edema, etc.): Pt had O2 sats of 100% with resting ck on room air, and then recheck after gait was 100%    Exercises     Assessment/Plan    PT Assessment Patient needs continued PT services   PT Problem List Decreased strength;Decreased range of motion;Decreased activity tolerance;Decreased balance;Decreased mobility;Decreased knowledge of use of DME;Decreased safety awareness;Cardiopulmonary status limiting activity       PT Treatment Interventions DME instruction;Gait training;Stair training;Functional mobility training;Therapeutic activities;Therapeutic exercise;Balance training;Neuromuscular re-education;Patient/family education    PT Goals (Current goals can be found in the Care Plan section)  Acute Rehab PT Goals Patient Stated Goal: to walk and go home with daughter PT Goal Formulation: With patient Time For Goal Achievement: 10/03/19 Potential to Achieve Goals: Good    Frequency Min 2X/week   Barriers to discharge Inaccessible home environment has stairs to enter house    Co-evaluation               AM-PAC PT "6 Clicks" Mobility  Outcome Measure Help needed turning from your back to your side while in a flat bed without using bedrails?: None Help needed moving from lying on your back to sitting on the side of a flat bed without using bedrails?: A Little Help needed moving to and from a bed to a chair (including a wheelchair)?: A Little Help needed standing up from a chair using your arms (e.g., wheelchair or bedside chair)?: A Little Help needed to walk in hospital room?: A Little Help needed climbing 3-5 steps with a railing? : A Lot 6 Click Score: 18    End of Session Equipment Utilized During Treatment: Gait belt Activity Tolerance: Patient tolerated treatment well;Patient limited by fatigue Patient left: in chair;with call bell/phone within reach;with chair alarm set Nurse Communication: Mobility status PT Visit Diagnosis: Unsteadiness on feet (R26.81);Muscle weakness (generalized) (M62.81);Difficulty in walking, not elsewhere classified (R26.2)    Time: 1132-1200 PT Time Calculation (min) (ACUTE ONLY): 28 min   Charges:   PT Evaluation $PT  Eval Moderate Complexity: 1 Mod PT Treatments $Gait Training: 8-22 mins       Ramond Dial 09/19/2019, 1:11 PM   Mee Hives, PT MS Acute Rehab Dept. Number: [place] and [place]

## 2019-09-19 NOTE — Progress Notes (Signed)
Progress Note  Patient Name: Laurie Harris Date of Encounter: 09/19/2019  Primary Cardiologist: Dr. Fletcher Anon  Subjective   Feels fine, no acute events overnight.  Inpatient Medications    Scheduled Meds: . aspirin EC  81 mg Oral Daily  . benazepril  5 mg Oral Daily  . cholecalciferol  2,000 Units Oral Daily  . enoxaparin (LOVENOX) injection  40 mg Subcutaneous Q24H  . fenofibrate  160 mg Oral Daily  . insulin aspart  0-9 Units Subcutaneous Q4H  . insulin aspart protamine- aspart  12 Units Subcutaneous BID WC  . multivitamin with minerals  1 tablet Oral Daily  . omega-3 acid ethyl esters  1 g Oral Daily  . pantoprazole  40 mg Oral Daily  . rosuvastatin  40 mg Oral Daily  . sodium chloride flush  3 mL Intravenous Q12H   Continuous Infusions: . sodium chloride Stopped (09/19/19 0514)   PRN Meds: acetaminophen, hydrOXYzine, meclizine, ondansetron (ZOFRAN) IV, polyethylene glycol powder   Vital Signs    Vitals:   09/18/19 2030 09/19/19 0426 09/19/19 0908 09/19/19 0935  BP: (!) 165/59 121/67 (!) 152/65   Pulse: 82 80 82   Resp: 18 18    Temp: (!) 97.5 F (36.4 C) (!) 97.5 F (36.4 C)    TempSrc: Oral Oral    SpO2: 99% 99% 100% 100%  Weight:        Intake/Output Summary (Last 24 hours) at 09/19/2019 1114 Last data filed at 09/19/2019 0645 Gross per 24 hour  Intake 1195.46 ml  Output --  Net 1195.46 ml   Last 3 Weights 09/17/2019 08/23/2019 08/12/2019  Weight (lbs) 118 lb 120 lb 119 lb  Weight (kg) 53.524 kg 54.432 kg 53.978 kg      Telemetry    Sinus rhythm heart rate 86- Personally Reviewed    Physical Exam   GEN: No acute distress.   Neck: No JVD Cardiac: RRR, no murmurs, rubs, or gallops.  Respiratory: Clear to auscultation bilaterally. GI: Soft, nontender, non-distended  MS: No edema; No deformity. Neuro:  Nonfocal  Psych: Normal affect   Labs    High Sensitivity Troponin:   Recent Labs  Lab 08/23/19 0840 08/23/19 1205 09/17/19 2136  09/18/19 0007 09/18/19 1043  TROPONINIHS 6 5 27* 95* 40*      Chemistry Recent Labs  Lab 09/17/19 2136 09/19/19 0442  NA 138 142  K 4.8 4.9  CL 107 114*  CO2 22 21*  GLUCOSE 212* 101*  BUN 35* 30*  CREATININE 1.61* 0.96  CALCIUM 9.2 9.2  GFRNONAA 30* 56*  GFRAA 35* >60  ANIONGAP 9 7     Hematology Recent Labs  Lab 09/17/19 2136 09/19/19 0442  WBC 11.9* 7.1  RBC 3.85* 3.66*  HGB 12.9 12.1  HCT 38.2 36.3  MCV 99.2 99.2  MCH 33.5 33.1  MCHC 33.8 33.3  RDW 12.6 12.5  PLT 167 173    BNPNo results for input(s): BNP, PROBNP in the last 168 hours.   DDimer No results for input(s): DDIMER in the last 168 hours.   Radiology    CT Head Wo Contrast  Result Date: 09/17/2019 CLINICAL DATA:  Dizziness EXAM: CT HEAD WITHOUT CONTRAST TECHNIQUE: Contiguous axial images were obtained from the base of the skull through the vertex without intravenous contrast. COMPARISON:  08/23/2019 FINDINGS: Brain: There is atrophy and chronic small vessel disease changes. No acute intracranial abnormality. Specifically, no hemorrhage, hydrocephalus, mass lesion, acute infarction, or significant intracranial injury. Vascular: No hyperdense  vessel or unexpected calcification. Skull: No acute calvarial abnormality. Sinuses/Orbits: Visualized paranasal sinuses and mastoids clear. Orbital soft tissues unremarkable. Other: None IMPRESSION: Atrophy, chronic microvascular disease. No acute intracranial abnormality. Electronically Signed   By: Rolm Baptise M.D.   On: 09/17/2019 21:57   MR BRAIN WO CONTRAST  Result Date: 09/18/2019 CLINICAL DATA:  Initial evaluation for acute ataxia, dizziness. EXAM: MRI HEAD WITHOUT CONTRAST TECHNIQUE: Multiplanar, multiecho pulse sequences of the brain and surrounding structures were obtained without intravenous contrast. COMPARISON:  Prior head CT from earlier the same day. FINDINGS: Brain: Examination mildly degraded by motion artifact. Generalized age-related cerebral  atrophy. Patchy T2/FLAIR hyperintensity within the periventricular and deep white matter both cerebral hemispheres most consistent with chronic small vessel ischemic disease, mild to moderate in nature. Patchy involvement of the pons noted. No abnormal foci of restricted diffusion to suggest acute or subacute ischemia. Gray-white matter differentiation maintained. No areas of remote cortical infarction identified. No evidence for acute intracranial hemorrhage. Single linear focus of susceptibility artifact noted at the left paramedian cerebellar vermis, nonspecific, but suggestive of small focus of chronic hemorrhage, of doubtful significance in the acute setting. No other evidence for chronic intracranial hemorrhage. No mass lesion, midline shift or mass effect. No hydrocephalus. No extra-axial fluid collection. Pituitary gland and suprasellar region within normal limits. Midline structures intact. Vascular: Major intracranial vascular flow voids are maintained. Skull and upper cervical spine: Craniocervical junction within normal limits. Bone marrow signal intensity normal. No scalp soft tissue abnormality. Sinuses/Orbits: Patient status post bilateral ocular lens replacement. Globes and orbital soft tissues demonstrate no acute finding. Paranasal sinuses are largely clear. No mastoid effusion. Inner ear structures normal. Other: None. IMPRESSION: 1. No acute intracranial abnormality. 2. Generalized age-related cerebral atrophy with mild-to-moderate chronic microvascular ischemic disease. Electronically Signed   By: Jeannine Boga M.D.   On: 09/18/2019 00:21   US Carotid Bilateral  Result Date: 09/19/2019 CLINICAL DATA:  Syncope EXAM: BILATERAL CAROTID DUPLEX ULTRASOUND TECHNIQUE: Pearline Cables scale imaging, color Doppler and duplex ultrasound were performed of bilateral carotid and vertebral arteries in the neck. COMPARISON:  09/11/2016 FINDINGS: Criteria: Quantification of carotid stenosis is based on velocity  parameters that correlate the residual internal carotid diameter with NASCET-based stenosis levels, using the diameter of the distal internal carotid lumen as the denominator for stenosis measurement. The following velocity measurements were obtained: RIGHT ICA: 142/16 cm/sec CCA: 123XX123 cm/sec SYSTOLIC ICA/CCA RATIO:  2.9 ECA: 48 cm/sec LEFT ICA: 103/18 cm/sec CCA: [id number] cm/sec SYSTOLIC ICA/CCA RATIO:  1.1 ECA: 144 cm/sec RIGHT CAROTID ARTERY: Moderate echogenic shadowing plaque formation of the right carotid bifurcation. Despite this, no hemodynamically significant right ICA stenosis, velocity elevation, turbulent flow. Degree of narrowing estimated at less than 50% by ultrasound criteria. RIGHT VERTEBRAL ARTERY:  Antegrade LEFT CAROTID ARTERY: Similar moderate heterogeneous partially calcified left carotid bifurcation atherosclerosis. Despite this, no hemodynamically significant left ICA stenosis, velocity elevation, turbulent flow. Degree of narrowing also estimated less than 50%. LEFT VERTEBRAL ARTERY:  Antegrade IMPRESSION: Moderate bilateral carotid atherosclerosis. No hemodynamically significant ICA stenosis. Degree of narrowing less than 50% bilaterally by ultrasound criteria. Patent antegrade vertebral flow bilaterally Electronically Signed   By: Jerilynn Mages.  Shick M.D.   On: 09/19/2019 10:10   ECHOCARDIOGRAM COMPLETE  Result Date: 09/18/2019   ECHOCARDIOGRAM REPORT   Patient Name:   Laurie Harris Date of Exam: 09/18/2019 Medical Rec #:  [id number]       Height:       65.0 in Accession #:  [id number]      Weight:       118.0 lb Date of Birth:  1940/11/21       BSA:          1.58 m Patient Age:    43 years        BP:           136/70 mmHg Patient Gender: F               HR:           77 bpm. Exam Location:  [facility] Procedure: 2D Echo, Color Doppler and Cardiac Doppler Indications:     Stroke 434.91  History:         Patient has prior history of Echocardiogram examinations, most                  recent 09/27/2016.  Signs/Symptoms:Syncope; Risk                  Factors:Hypertension and Diabetes.  Sonographer:     Sherrie Sport RDCS (AE) Referring Phys:  R[place]verte Diagnosing Phys: Ida Rogue MD IMPRESSIONS  1. Left ventricular ejection fraction, by visual estimation, is 60 to 65%. The left ventricle has normal function. There is mildly increased left ventricular hypertrophy.  2. Left ventricular diastolic parameters are consistent with Grade I diastolic dysfunction (impaired relaxation).  3. The left ventricle has no regional wall motion abnormalities.  4. Global right ventricle has normal systolic function.The right ventricular size is normal. No increase in right ventricular wall thickness.  5. Left atrial size was normal.  6. The aortic valve is normal in structure. Aortic valve regurgitation is not visualized. Mild to moderate aortic valve sclerosis/calcification without any evidence of aortic stenosis.  7. TR signal is inadequate for assessing pulmonary artery systolic pressure. FINDINGS  Left Ventricle: Left ventricular ejection fraction, by visual estimation, is 60 to 65%. The left ventricle has normal function. The left ventricle has no regional wall motion abnormalities. There is mildly increased left ventricular hypertrophy. Left ventricular diastolic parameters are consistent with Grade I diastolic dysfunction (impaired relaxation). Normal left atrial pressure. Right Ventricle: The right ventricular size is normal. No increase in right ventricular wall thickness. Global RV systolic function is has normal systolic function. Left Atrium: Left atrial size was normal in size. Right Atrium: Right atrial size was normal in size Pericardium: There is no evidence of pericardial effusion. Mitral Valve: The mitral valve is normal in structure. No evidence of mitral valve regurgitation. No evidence of mitral valve stenosis by observation. Tricuspid Valve: The tricuspid valve is normal in structure. Tricuspid valve  regurgitation is not demonstrated. Aortic Valve: The aortic valve is normal in structure. Aortic valve regurgitation is not visualized. Mild to moderate aortic valve sclerosis/calcification is present, without any evidence of aortic stenosis. Aortic valve mean gradient measures 3.7 mmHg. Aortic valve peak gradient measures 6.0 mmHg. Aortic valve area, by VTI measures 2.19 cm. Pulmonic Valve: The pulmonic valve was normal in structure. Pulmonic valve regurgitation is not visualized. Pulmonic regurgitation is not visualized. Aorta: The aortic root, ascending aorta and aortic arch are all structurally normal, with no evidence of dilitation or obstruction. Venous: The inferior vena cava is normal in size with greater than 50% respiratory variability, suggesting right atrial pressure of 3 mmHg. IAS/Shunts: No atrial level shunt detected by color flow Doppler. There is no evidence of a patent foramen ovale. No ventricular septal defect is seen or detected. There  is no evidence of an atrial septal defect.  LEFT VENTRICLE PLAX 2D LVIDd:         3.70 cm  Diastology LVIDs:         2.40 cm  LV e' lateral:   9.68 cm/s LV PW:         1.24 cm  LV E/e' lateral: 4.9 LV IVS:        1.04 cm  LV e' medial:    6.09 cm/s LVOT diam:     2.00 cm  LV E/e' medial:  7.8 LV SV:         38 ml LV SV Index:   24.30 LVOT Area:     3.14 cm  RIGHT VENTRICLE RV Basal diam:  2.64 cm RV S prime:     15.40 cm/s TAPSE (M-mode): 3.0 cm LEFT ATRIUM             Index       RIGHT ATRIUM          Index LA diam:        3.10 cm 1.96 cm/m  RA Area:     7.30 cm LA Vol (A2C):   32.0 ml 20.24 ml/m RA Volume:   10.90 ml 6.90 ml/m LA Vol (A4C):   16.1 ml 10.18 ml/m LA Biplane Vol: 24.5 ml 15.50 ml/m  AORTIC VALVE                   PULMONIC VALVE AV Area (Vmax):    2.03 cm    PV Vmax:        0.41 m/s AV Area (Vmean):   1.90 cm    PV Peak grad:   0.7 mmHg AV Area (VTI):     2.19 cm    RVOT Peak grad: 1 mmHg AV Vmax:           122.33 cm/s AV Vmean:           86.500 cm/s AV VTI:            0.247 m AV Peak Grad:      6.0 mmHg AV Mean Grad:      3.7 mmHg LVOT Vmax:         79.10 cm/s LVOT Vmean:        52.200 cm/s LVOT VTI:          0.172 m LVOT/AV VTI ratio: 0.70  AORTA Ao Root diam: 2.80 cm MITRAL VALVE MV Area (PHT): 5.13 cm             SHUNTS MV PHT:        42.92 msec           Systemic VTI:  0.17 m MV Decel Time: 148 msec             Systemic Diam: 2.00 cm MV E velocity: 47.50 cm/s 103 cm/s MV A velocity: 91.70 cm/s 70.3 cm/s MV E/A ratio:  0.52       1.5  Ida Rogue MD Electronically signed by Ida Rogue MD Signature Date/Time: 09/18/2019/12:17:40 PM    Final     Cardiac Studies   TTE 09/18/2019  1. Left ventricular ejection fraction, by visual estimation, is 60 to 65%. The left ventricle has normal function. There is mildly increased left ventricular hypertrophy.  2. Left ventricular diastolic parameters are consistent with Grade I diastolic dysfunction (impaired relaxation).  3. The left ventricle has no regional wall motion abnormalities.  4. Global right ventricle has  normal systolic function.The right ventricular size is normal. No increase in right ventricular wall thickness.  5. Left atrial size was normal.  6. The aortic valve is normal in structure. Aortic valve regurgitation is not visualized. Mild to moderate aortic valve sclerosis/calcification without any evidence of aortic stenosis.  7. TR signal is inadequate for assessing pulmonary artery systolic pressure.  Patient Profile     79 y.o. 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  Assessment & Plan    Syncopal work-up with carotid ultrasound showed moderate carotid stenosis.  Echocardiogram showed normal ejection fraction with grade 1 diastolic dysfunction.  Telemetry monitor on this admission has not shown any significant arrhythmias to justify syncopal event.  Prior  syncopal events were deemed secondary to dehydration/vasovagal.  1.  Syncope -Etiology unclear. -Telemetry monitor with no significant arrhythmias.  Echocardiogram with no significant structural abnormalities. -Plan for cardiac monitor as outpatient and patient will follow up with cardiology in the clinic. -Recommend patient should not be driving. -No further cardiac testing planned.  2.  Moderate carotid artery stenosis -Aspirin and crestor  3.  Hypertension -BP well controlled -Continue PTA amlodipine      Signed, Kate Sable, MD  09/19/2019, 11:14 AM

## 2019-09-21 ENCOUNTER — Telehealth: Payer: Self-pay

## 2019-09-21 NOTE — Telephone Encounter (Addendum)
Transition Care Management Follow-up Telephone Call  Date of discharge and from where: 09/19/19 from [facility]. Information received from patient and daughter Jeani Hawking, HIPAA compliant.  How have you been since you were released from the hospital? I am little weaker but recovering okay. Denies dizziness, nausea, vomiting. Appetite is good. BM/voiding without issues. Denies confusion; reports at baseline, working on sodoko puzzles.   Any questions or concerns? Daughter requests order/Rx for long term care if needed. Reports she was told at the hospital someone needs to be with patient at all times, however not listed in discharge note.   Items Reviewed:  Did the pt receive and understand the discharge instructions provided? Yes, Drink more water, use walker when ambulating, increase activity as tolerated.  Medications obtained and verified? Yes, managed with pill box.  Any new allergies since your discharge? None new.   Dietary orders reviewed? Yes, low sodium heart healthy.  Do you have support at home? Yes, patient lives with daughter Jeani Hawking).    Functional Questionnaire: (I = Independent and D = Dependent) ADLs: I  Bathing/Dressing- Independent; paces self  Meal Prep- I  Eating- I  Maintaining continence- I  Transferring/Ambulation- Independent with walker.  Managing Meds- I  Follow up appointments reviewed:   PCP Hospital f/u appt confirmed? Yes, Scheduled to see DR. Sonnenberg on 09/25/19 @ 10:00 via virtual [phone].  Cardiology outpatient follow up confirmed?  Yes, Zio monitor to be received this week via [employer].   Home health PT/OT in progress? No. Awaiting contact from [facility] PT/OT.    Are transportation arrangements needed? No.  If their condition worsens, is the pt aware to call PCP or go to the Emergency Dept.? Yes.  Was the patient provided with contact information for the PCP's office or ED? Yes.  Was to pt encouraged to call back with questions or concerns? Yes.

## 2019-09-21 NOTE — Telephone Encounter (Signed)
Call attempted. No answer, no voicemail.

## 2019-09-22 ENCOUNTER — Other Ambulatory Visit: Payer: Self-pay | Admitting: *Deleted

## 2019-09-22 NOTE — Telephone Encounter (Signed)
It appears PT recommended home health PT and this appears to have been ordered by the hospital. We can discuss further at her visit.

## 2019-09-22 NOTE — Patient Outreach (Addendum)
[facility]) Care Management  09/22/2019  Laurie Harris 11-Aug-1941 [id number]   RED ON EMMI ALERT General Discharge Day # 1 Date: 09/21/2019 Red Alert Reason: Doesn't know who to contact with changes, No transportation, & unfilled prescriptions   Outreach attempt #1, unsuccessful.  Unable to leave message as voice mailbox has not been set up.   Plan: RN CM will send unsuccessful outreach letter and follow up within the next 3-4 business days.  Laurie Harris, [place], MSN [place] [phone]

## 2019-09-22 NOTE — Telephone Encounter (Signed)
Pt is aware that zio is on the way and will call the number on the box for assistance needed to place.   Advised pt to call for any further questions or concerns.

## 2019-09-23 ENCOUNTER — Ambulatory Visit (INDEPENDENT_AMBULATORY_CARE_PROVIDER_SITE_OTHER): Payer: Medicare Other

## 2019-09-23 ENCOUNTER — Ambulatory Visit: Payer: Medicare Other | Admitting: Family Medicine

## 2019-09-23 DIAGNOSIS — R55 Syncope and collapse: Secondary | ICD-10-CM

## 2019-09-24 DIAGNOSIS — R55 Syncope and collapse: Secondary | ICD-10-CM | POA: Diagnosis not present

## 2019-09-25 ENCOUNTER — Other Ambulatory Visit: Payer: Self-pay

## 2019-09-25 ENCOUNTER — Other Ambulatory Visit: Payer: Self-pay | Admitting: *Deleted

## 2019-09-25 ENCOUNTER — Encounter: Payer: Self-pay | Admitting: *Deleted

## 2019-09-25 ENCOUNTER — Ambulatory Visit (INDEPENDENT_AMBULATORY_CARE_PROVIDER_SITE_OTHER): Payer: Medicare Other | Admitting: Family Medicine

## 2019-09-25 VITALS — Ht 66.0 in | Wt 118.0 lb

## 2019-09-25 DIAGNOSIS — N1831 Chronic kidney disease, stage 3a: Secondary | ICD-10-CM

## 2019-09-25 DIAGNOSIS — R531 Weakness: Secondary | ICD-10-CM | POA: Diagnosis not present

## 2019-09-25 DIAGNOSIS — N179 Acute kidney failure, unspecified: Secondary | ICD-10-CM

## 2019-09-25 DIAGNOSIS — R55 Syncope and collapse: Secondary | ICD-10-CM

## 2019-09-25 DIAGNOSIS — E1129 Type 2 diabetes mellitus with other diabetic kidney complication: Secondary | ICD-10-CM

## 2019-09-25 DIAGNOSIS — Z794 Long term (current) use of insulin: Secondary | ICD-10-CM

## 2019-09-25 NOTE — Assessment & Plan Note (Signed)
Well-controlled.  Continue current regimen. 

## 2019-09-25 NOTE — Assessment & Plan Note (Signed)
Improved to her baseline.  Encouraged adequate hydration.

## 2019-09-25 NOTE — Assessment & Plan Note (Signed)
Patient with recent hospitalization for syncope versus presyncope.  She has done well since discharge.  I suspect she may have been dehydrated leading to her feeling weak and her presyncopal symptoms.  I encouraged her to continue with increased water intake.  Discussed rechecking her kidney function in about a month.  She will complete the outpatient monitor through cardiology and follow-up with them as appropriate.  Discussed reasons to seek medical attention in the emergency room.  Physical therapy through home health has been ordered.  I advised that they check with their long-term care insurance plan to see what the steps are for coverage.

## 2019-09-25 NOTE — Progress Notes (Signed)
Virtual Visit via video Note  This visit type was conducted due to national recommendations for restrictions regarding the COVID-19 pandemic (e.g. social distancing).  This format is felt to be most appropriate for this patient at this time.  All issues noted in this document were discussed and addressed.  No physical exam was performed (except for noted visual exam findings with Video Visits).   I connected with Laurie Harris today at 10:00 AM EST by a video enabled telemedicine application and verified that I am speaking with the correct person using two identifiers. Location patient: home Location provider: work Persons participating in the virtual visit: patient, provider, Junius Creamer (daughter)  I discussed the limitations, risks, security and privacy concerns of performing an evaluation and management service by telephone and the availability of in person appointments. I also discussed with the patient that there may be a patient responsible charge related to this service. The patient expressed understanding and agreed to proceed.   Reason for visit: follow-up  HPI: Patient was hospitalized from 09/17/2019-09/19/2019 for possible syncope versus presyncope.  Patient notes her daughter felt she looked funny.  The patient notes she felt weak though otherwise felt fine.  She was evaluated in the hospital and noted to be dehydrated.  She also underwent CT head and MRI brain with no obvious cause for her symptoms.  Her troponin did trend up though then subsequently trended down.  Cardiology evaluated her and noted no inpatient work-up was needed and that they would get her set up with a zio monitor which she does currently have on.  She noted no chest pain, shortness of breath, palpitations, numbness, or focal weakness.  They do note her sugars were up around 200 that day though otherwise have been well controlled in the 120-160 range.  Most recent A1c is 6.8.  She is taking her insulin 18 units  twice daily.  No hypoglycemia.  Carotid Dopplers revealed moderate bilateral carotid atherosclerosis with no hemodynamically significant internal carotid artery stenosis.  Degree of narrowing less than 50%.  Doing okay now.  She has increased her water intake to 2 L daily from 1.5 L daily.  She has not been set up with physical therapy yet.  She does have somebody staying with her daily and has long-term care insurance though needs to access this.  Discharge summary reviewed.  Medications reviewed.   ROS: See pertinent positives and negatives per HPI.  Past Medical History:  Diagnosis Date  . Carotid arterial disease ([place])    a. 09/2016 Carotid U/S: R carotid bifurcation dzs of 50-69%, L carotid bifurcation dzs of <50%. Patent vertebral arteries w/ anegrade flow.  . Cerebellar hemorrhage ([place]) 03/16/2015   Left Cerebellar Hemorrhage- See Care Everywhere Seen by [facility]    . Chicken pox   . CKD (chronic kidney disease), stage III    functioning at 46%  . Diabetes mellitus without complication ([place])   . Hemorrhagic stroke ([place]) 12/31/2014   a. in the setting of warfarin therapy.  . Hyperlipidemia   . Hypertension   . Syncope    a. 09/2016 Echo: EF 55-60%, no rwma, Gr1 DD.    Past Surgical History:  Procedure Laterality Date  . ABCESS DRAINAGE  06/14/2016   appendix  . APPENDECTOMY  06/13/2016  . CATARACT EXTRACTION W/PHACO Left 09/04/2017   Procedure: CATARACT EXTRACTION PHACO AND INTRAOCULAR LENS PLACEMENT ([place]) COMPLICATED LEFT DIABETIC;  Surgeon: Leandrew Koyanagi, MD;  Location: [place];  Service: Ophthalmology;  Laterality: Left;  Diabetic - insulin  . CATARACT EXTRACTION W/PHACO Right 10/02/2017   Procedure: CATARACT EXTRACTION PHACO AND INTRAOCULAR LENS PLACEMENT ([place]) COMPLICATED  RIGHT DIABETIC;  Surgeon: Leandrew Koyanagi, MD;  Location: [place];  Service: Ophthalmology;  Laterality: Right;  Diabetic - insulin  . CHOLECYSTECTOMY N/A  06/17/2018   Procedure: LAPAROSCOPIC CHOLECYSTECTOMY;  Surgeon: Benjamine Sprague, DO;  Location: [facility];  Service: General;  Laterality: N/A;  . COLONOSCOPY    . NECK SURGERY      Family History  Problem Relation Age of Onset  . Diabetes Mother   . Dementia Mother   . Heart disease Mother   . Cancer Mother        lung  . Cancer Father        colon  . Stroke Daughter   . Hyperlipidemia Daughter   . Breast cancer Neg Hx     SOCIAL HX: Non-smoker   Current Outpatient Medications:  .  aspirin EC 81 MG tablet, Take 81 mg by mouth daily., Disp: , Rfl:  .  benazepril (LOTENSIN) 5 MG tablet, TAKE ONE TABLET EVERY DAY, Disp: 90 tablet, Rfl: 2 .  Calcium Carbonate (CALCIUM 600 PO), Take 1 tablet by mouth daily. , Disp: , Rfl:  .  cholecalciferol (VITAMIN D) 1000 units tablet, Take 2,000 Units by mouth daily., Disp: , Rfl:  .  docusate sodium (COLACE) 100 MG capsule, Take 1 capsule (100 mg total) by mouth 2 (two) times daily as needed for mild constipation., Disp: 10 capsule, Rfl: 0 .  fenofibrate 160 MG tablet, TAKE ONE TABLET BY MOUTH EVERY DAY, Disp: 90 tablet, Rfl: 1 .  Insulin Lispro Prot & Lispro (HUMALOG MIX 75/25 KWIKPEN) (75-25) 100 UNIT/ML Kwikpen, INJECT 17 UNITS TWICE DAILY AS DIRECTED, Disp: 15 mL, Rfl: 2 .  insulin lispro protamine-lispro (HUMALOG 75/25 MIX) (75-25) 100 UNIT/ML SUSP injection, Inject into the skin. Inject 19 units in the morning and 18 at night, Disp: , Rfl:  .  Multiple Vitamin (MULTIVITAMIN WITH MINERALS) TABS tablet, Take 1 tablet by mouth daily., Disp: , Rfl:  .  NOVOFINE PLUS 32G X 4 MM MISC, USE TWICE A DAY AS DIRECTED, Disp: 100 each, Rfl: 6 .  Omega-3 Fatty Acids (FISH OIL) 1200 MG CAPS, Take 1,200 mg by mouth daily., Disp: , Rfl:  .  pantoprazole (PROTONIX) 40 MG tablet, TAKE ONE TABLET BY MOUTH EVERY DAY, Disp: 90 tablet, Rfl: 2 .  polyethylene glycol powder (GLYCOLAX/MIRALAX) powder, Take 17 g by mouth daily as needed for mild constipation., Disp: 500  g, Rfl: 0 .  rosuvastatin (CRESTOR) 40 MG tablet, TAKE ONE TABLET EVERY DAY, Disp: 90 tablet, Rfl: 3 .  triamcinolone cream (KENALOG) 0.1 %, Apply 1 application topically 2 (two) times daily., Disp: 30 g, Rfl: 0 .  meclizine (ANTIVERT) 12.5 MG tablet, Take 1 tablet (12.5 mg total) by mouth 3 (three) times daily as needed for dizziness. (Patient not taking: Reported on 09/25/2019), Disp: 30 tablet, Rfl: 0  EXAM:  VITALS per patient if applicable:  GENERAL: alert, oriented, appears well and in no acute distress  HEENT: atraumatic, conjunttiva clear, no obvious abnormalities on inspection of external nose and ears  NECK: normal movements of the head and neck  LUNGS: on inspection no signs of respiratory distress, breathing rate appears normal, no obvious gross SOB, gasping or wheezing  CV: no obvious cyanosis  MS: moves all visible extremities without noticeable abnormality  PSYCH/NEURO: pleasant and cooperative, no obvious depression or  anxiety, speech and thought processing grossly intact  ASSESSMENT AND PLAN:  Discussed the following assessment and plan:  Syncope Patient with recent hospitalization for syncope versus presyncope.  She has done well since discharge.  I suspect she may have been dehydrated leading to her feeling weak and her presyncopal symptoms.  I encouraged her to continue with increased water intake.  Discussed rechecking her kidney function in about a month.  She will complete the outpatient monitor through cardiology and follow-up with them as appropriate.  Discussed reasons to seek medical attention in the emergency room.  Physical therapy through home health has been ordered.  I advised that they check with their long-term care insurance plan to see what the steps are for coverage.  Acute renal failure superimposed on stage 3a chronic kidney disease ([place]) Improved to her baseline.  Encouraged adequate hydration.  Type II diabetes mellitus with renal  manifestations ([place]) Well-controlled.  Continue current regimen.   Orders Placed This Encounter  Procedures  . Basic Metabolic Panel (BMET)    Standing Status:   Future    Standing Expiration Date:   09/24/2020  . Ambulatory referral to Home Health    Referral Priority:   Routine    Referral Type:   Home Health Care    Referral Reason:   Specialty Services Required    Requested Specialty:   [place]    Number of Visits Requested:   1    No orders of the defined types were placed in this encounter.   I encouraged the patient to get the COVID-19 vaccine.     I discussed the assessment and treatment plan with the patient. The patient was provided an opportunity to ask questions and all were answered. The patient agreed with the plan and demonstrated an understanding of the instructions.   The patient was advised to call back or seek an in-person evaluation if the symptoms worsen or if the condition fails to improve as anticipated.   Tommi Rumps, MD

## 2019-09-25 NOTE — Patient Outreach (Signed)
[facility]) Care Management  09/25/2019  Laurie Harris [birth year] [id number]   RED ON EMMI ALERT General Discharge Day # 1 Date: 09/21/2019 Red Alert Reason: Doesn't know who to contact with changes, No transportation, & unfilled prescriptions   Outreach attempt #1, unsuccessful.  Unable to leave message as voice mailbox has not been set up.  Call then placed to member's daughter, no answer.  HIPAA compliant voice message left, will await call back.  If no call back will follow up within the next 3-4 business days.  Update:  Call received back from daughter, member's identity verified.  This care manager introduced self and stated purpose of call, [facility] care management services explained. She report member has been doing well since her discharge.  Member has been living in the home with her since July.  Report she received discharge papers and filled all prescriptions although there was only one new medication, Meclizine. State member has since stopped taking it because it was causing more dizziness, MD aware.  She has been providing all transportation to appointments for the last several months, denies the need for assistance.  Had virtual visit today with PCP, will have follow up within the next 2 weeks.  Has appointment for Covid vaccine scheduled for Monday.  Report member was told that she could not be left alone.  Daughter works during the day but has set up for member to have sitters.  State she is interested in setting up long term insurance for member, open to outreach from Education officer, museum.  Member is using walker more now since her fall, will have home health PT as report order was written today.  She has heart monitor that she will wear for the next 2 weeks and provide information back to MD. Anticipating need to see cardiologist to review results.  Report blood pressure and blood sugars being controlled, last A1C was 6.8, state blood sugars range 120-140s. Blood  pressure range 130s/70s.  State member follows diabetic and low salt diet.  Daughter denies any urgent concerns at this time. Advised to contact this care manager with questions.  Will place referral to social worker and follow up within the next 2 weeks.  Fall Risk  09/25/2019 07/10/2019 07/29/2018 07/02/2018 07/01/2017  Falls in the past year? 1 0 0 No Yes  Number falls in past yr: 0 - 0 - 1  Injury with Fall? 1 - 0 - No  Risk Factor Category  - - - - -  Risk for fall due to : History of fall(s) - - - -  Follow up - - - - Falls prevention discussed   Depression screen [facility] 2/9 09/25/2019 07/10/2019 07/29/2018 07/02/2018 07/01/2017  Decreased Interest 0 0 0 0 0  Down, Depressed, Hopeless 0 0 0 0 1  PHQ - 2 Score 0 0 0 0 1   THN CM Care Plan Problem One     Most Recent Value  Care Plan Problem One  Risk for fall related to syncope episodes as evidenced by recent admission  Role Documenting the Problem One  Care Management [place] for Problem One  Active  [facility] Long Term Goal   Member will not report any falls within the next 45 days  THN Long Term Goal Start Date  09/25/19  Interventions for Problem One Long Term Goal  Discharge instructions reviewed with daughter. Educated on use of DME in effort to decrease risk of falls. Confirmed order for PT has  been written, advised to contact this care manager if no call to set up visits.  THN CM Short Term Goal #1   Member will report completion of heart monitoring over the next 2 weeks  THN CM Short Term Goal #1 Start Date  09/25/19  Interventions for Short Term Goal #1  Confirmed member has received equipment for study. Advised of importance of getting information to determine cause of syncope  THN CM Short Term Goal #2   Daughter will report compliane with medications over the next 2 weeks  THN CM Short Term Goal #2 Start Date  09/25/19  Interventions for Short Term Goal #2  Medications reviewed with daughter, advised of importance of  taking as instructed.     Valente David, [place], MSN [place] [phone]

## 2019-09-28 DIAGNOSIS — N183 Chronic kidney disease, stage 3 unspecified: Secondary | ICD-10-CM | POA: Diagnosis not present

## 2019-09-29 ENCOUNTER — Other Ambulatory Visit: Payer: Self-pay

## 2019-09-29 NOTE — Patient Outreach (Signed)
[facility]) Care Management  09/29/2019  Laurie Harris May 10, 1941 [id number]   Social Work referral received from [employer].  "Family report they were told that patient could not be left alone. She has sitters set up but is trying to set up patient's long term care insurance for ongoing coverage of needs." Attempted to contact patient's daughter today as this who Brayton Layman originally spoke with.  Left voicemail message.  Will attempt to reach again within four business days.  Ronn Melena, BSW Social Worker [phone]

## 2019-09-30 ENCOUNTER — Other Ambulatory Visit: Payer: Self-pay

## 2019-09-30 ENCOUNTER — Telehealth: Payer: Self-pay | Admitting: Family Medicine

## 2019-09-30 DIAGNOSIS — E1122 Type 2 diabetes mellitus with diabetic chronic kidney disease: Secondary | ICD-10-CM | POA: Diagnosis not present

## 2019-09-30 DIAGNOSIS — N1831 Chronic kidney disease, stage 3a: Secondary | ICD-10-CM | POA: Diagnosis not present

## 2019-09-30 DIAGNOSIS — D7589 Other specified diseases of blood and blood-forming organs: Secondary | ICD-10-CM | POA: Diagnosis not present

## 2019-09-30 DIAGNOSIS — M810 Age-related osteoporosis without current pathological fracture: Secondary | ICD-10-CM | POA: Diagnosis not present

## 2019-09-30 DIAGNOSIS — I708 Atherosclerosis of other arteries: Secondary | ICD-10-CM | POA: Diagnosis not present

## 2019-09-30 DIAGNOSIS — N179 Acute kidney failure, unspecified: Secondary | ICD-10-CM | POA: Diagnosis not present

## 2019-09-30 DIAGNOSIS — Z7982 Long term (current) use of aspirin: Secondary | ICD-10-CM | POA: Diagnosis not present

## 2019-09-30 DIAGNOSIS — I700 Atherosclerosis of aorta: Secondary | ICD-10-CM | POA: Diagnosis not present

## 2019-09-30 DIAGNOSIS — Z8673 Personal history of transient ischemic attack (TIA), and cerebral infarction without residual deficits: Secondary | ICD-10-CM | POA: Diagnosis not present

## 2019-09-30 DIAGNOSIS — Z9181 History of falling: Secondary | ICD-10-CM | POA: Diagnosis not present

## 2019-09-30 DIAGNOSIS — Z794 Long term (current) use of insulin: Secondary | ICD-10-CM | POA: Diagnosis not present

## 2019-09-30 DIAGNOSIS — I10 Essential (primary) hypertension: Secondary | ICD-10-CM | POA: Diagnosis not present

## 2019-09-30 DIAGNOSIS — D126 Benign neoplasm of colon, unspecified: Secondary | ICD-10-CM | POA: Diagnosis not present

## 2019-09-30 DIAGNOSIS — Z9049 Acquired absence of other specified parts of digestive tract: Secondary | ICD-10-CM | POA: Diagnosis not present

## 2019-09-30 DIAGNOSIS — E860 Dehydration: Secondary | ICD-10-CM | POA: Diagnosis not present

## 2019-09-30 DIAGNOSIS — I6529 Occlusion and stenosis of unspecified carotid artery: Secondary | ICD-10-CM | POA: Diagnosis not present

## 2019-09-30 DIAGNOSIS — E785 Hyperlipidemia, unspecified: Secondary | ICD-10-CM | POA: Diagnosis not present

## 2019-09-30 DIAGNOSIS — R55 Syncope and collapse: Secondary | ICD-10-CM | POA: Diagnosis not present

## 2019-09-30 NOTE — Telephone Encounter (Signed)
Gerald Stabs from [facility]  Verbal Orders  Home Therapy - 2x a week for 3 weeks                            1x a week for 4 weeks

## 2019-09-30 NOTE — Patient Outreach (Signed)
[facility]) Care Management  09/30/2019  Laurie Harris Jul 15, 1941 [id number]   Social Work referral received from [facility] [place] on 09/28/19.  "Family report they were told that patient could not be left alone. She has sitters set up but is trying to set up patient's long term care insurance for ongoing coverage of needs." Incoming call from daughter in response to voicemail message left yesterday.  Daughter reports that patient already has long-term care insurance and a specific representative from the agency.  Did encourage daughter to speak with representative about specific requirements for aide services to be covered.  Patient currently has "sitters" but informed daughter that insurance companies typically require that aides be certified.  Daughter will plan to work with representative from [place] but she was encouraged to call me back if additional needs arise. Closing social work case at this time.  Ronn Melena, BSW Social Worker [phone]

## 2019-10-01 NOTE — Telephone Encounter (Signed)
I called and gave verbal orders to [facility] for home therapy 2x a week for 3 weeks and 1x a week for 4 weeks.  This was left on his voicemail. Sarafina Puthoff,cma

## 2019-10-05 ENCOUNTER — Other Ambulatory Visit: Payer: Self-pay

## 2019-10-05 ENCOUNTER — Encounter: Payer: Self-pay | Admitting: Emergency Medicine

## 2019-10-05 ENCOUNTER — Telehealth: Payer: Self-pay | Admitting: Family Medicine

## 2019-10-05 ENCOUNTER — Ambulatory Visit
Admission: EM | Admit: 2019-10-05 | Discharge: 2019-10-05 | Disposition: A | Discharge status: Home / Self Care | Payer: Medicare Other | Attending: Emergency Medicine | Admitting: Emergency Medicine

## 2019-10-05 DIAGNOSIS — R42 Dizziness and giddiness: Secondary | ICD-10-CM | POA: Diagnosis not present

## 2019-10-05 LAB — POCT FASTING CBG KUC MANUAL ENTRY: POCT Glucose (KUC): 191 mg/dL — AB (ref 70–99)

## 2019-10-05 NOTE — Telephone Encounter (Signed)
I would advise urgent care evaluation as we do not have any available appointments.

## 2019-10-05 NOTE — Telephone Encounter (Signed)
The daughter called me back and I informed her that we do not have any appointments available. I informed her to go to the urgent care and schedule she understood and agreed.  Macdonald Rigor,cma

## 2019-10-05 NOTE — ED Triage Notes (Signed)
Patient in today c/o "weak spell" this morning ~7:30am. Patient's daughter PCP and they suggested patient come to urgent care. Patient states she has been wearing a heart monitor since 09/23/19 because of same symptoms.

## 2019-10-05 NOTE — Telephone Encounter (Signed)
Pt daughter called stating pt states she felt dizzy if she stands up after taking nap. Daughter made her some whole wheat cheese nabs crackers and peanut butter crackers.  Blood sugar this am was 143 before eating breakfast and insulin.  Blood sugar now @ 10:14 am 123 after eating crackers. Pt feels a little better but still not right.   Please advise and thank you! Pt was transferred to Access Nurse

## 2019-10-05 NOTE — Discharge Instructions (Addendum)
Go to the emergency department if you have another episode of weakness or dizziness;  Or if you develop new symptoms such as chest pain, shortness of breath, or other concerning symptoms.    Your blood pressure is elevated today at 173/81.  Please have this rechecked by your primary care provider in 2-4 weeks.

## 2019-10-05 NOTE — Telephone Encounter (Signed)
Pt daughter # [phone] called and said that Access Nurse told her to call us and schedule an in office appt within 24 hours or Pt go to UC. Pt daughter said that she didn't want to take her to UC she wanted her to be seen in office

## 2019-10-05 NOTE — ED Provider Notes (Signed)
Roderic Palau    CSN: [id number] Arrival date & time: 10/05/19  1627      History   Chief Complaint Chief Complaint  Patient presents with  . Weakness    HPI Laurie Harris is a 79 y.o. female.   Patient presents with an episode of weakness and dizziness this morning; her symptoms resolved and have not returned.  She was hospitalized on 09/17/2019 with syncope; Patient is currently wearing a heart monitor.  She denies current dizziness, palpitations, chest pain, SOB, focal weakness, or other symptoms.    The history is provided by the patient and a relative.    Past Medical History:  Diagnosis Date  . Carotid arterial disease ([place])    a. 09/2016 Carotid U/S: R carotid bifurcation dzs of 50-69%, L carotid bifurcation dzs of <50%. Patent vertebral arteries w/ anegrade flow.  . Cerebellar hemorrhage ([place]) 03/16/2015   Left Cerebellar Hemorrhage- See Care Everywhere Seen by [facility]    . Chicken pox   . CKD (chronic kidney disease), stage III    functioning at 46%  . Diabetes mellitus without complication ([place])   . Hemorrhagic stroke ([place]) 12/31/2014   a. in the setting of warfarin therapy.  . Hyperlipidemia   . Hypertension   . Syncope    a. 09/2016 Echo: EF 55-60%, no rwma, Gr1 DD.    Patient Active Problem List   Diagnosis Date Noted  . Hypertension   . Elevated troponin   . Rash 08/12/2019  . Grief 07/10/2019  . Hirsutism 10/29/2018  . Constipation 07/29/2018  . Status post laparoscopic cholecystectomy 06/17/2018  . Subconjunctival hemorrhage of left eye 04/29/2018  . Osteoporosis 12/26/2017  . Aorto-iliac atherosclerosis ([place]) 09/20/2017  . Macrocytosis without anemia 09/20/2017  . Abdominal pain 11/05/2016  . Syncope 09/10/2016  . History of appendicitis 08/07/2016  . Carotid artery disease ([place]) 06/19/2016  . Acute renal failure superimposed on stage 3a chronic kidney disease ([place]) 05/10/2016  . Type II diabetes mellitus with renal  manifestations ([place]) 03/16/2015  . Benign essential HTN 03/16/2015  . Hyperlipidemia 03/16/2015  . Adenomatous polyp of colon 06/04/2014  . Carotid artery occlusion without infarction 08/18/2013    Past Surgical History:  Procedure Laterality Date  . ABCESS DRAINAGE  06/14/2016   appendix  . APPENDECTOMY  06/13/2016  . CATARACT EXTRACTION W/PHACO Left 09/04/2017   Procedure: CATARACT EXTRACTION PHACO AND INTRAOCULAR LENS PLACEMENT ([place]) COMPLICATED LEFT DIABETIC;  Surgeon: Leandrew Koyanagi, MD;  Location: [facility];  Service: Ophthalmology;  Laterality: Left;  Diabetic - insulin  . CATARACT EXTRACTION W/PHACO Right 10/02/2017   Procedure: CATARACT EXTRACTION PHACO AND INTRAOCULAR LENS PLACEMENT ([place]) COMPLICATED  RIGHT DIABETIC;  Surgeon: Leandrew Koyanagi, MD;  Location: [facility];  Service: Ophthalmology;  Laterality: Right;  Diabetic - insulin  . CHOLECYSTECTOMY N/A 06/17/2018   Procedure: LAPAROSCOPIC CHOLECYSTECTOMY;  Surgeon: Benjamine Sprague, DO;  Location: [facility];  Service: General;  Laterality: N/A;  . COLONOSCOPY    . NECK SURGERY      OB History   No obstetric history on file.      Home Medications    Prior to Admission medications   Medication Sig Start Date End Date Taking? Authorizing Provider  aspirin EC 81 MG tablet Take 81 mg by mouth daily.   Yes [provider]  benazepril (LOTENSIN) 5 MG tablet TAKE ONE TABLET EVERY DAY 05/12/19  Yes [name], MD  Calcium Carbonate (CALCIUM 600 PO) Take 1 tablet  by mouth daily.    Yes [provider]  cholecalciferol (VITAMIN D) 1000 units tablet Take 2,000 Units by mouth daily.   Yes [provider]  fenofibrate 160 MG tablet TAKE ONE TABLET BY MOUTH EVERY DAY 09/16/19  Yes [name], MD  Insulin Lispro Prot & Lispro (HUMALOG MIX 75/25 KWIKPEN) (75-25) 100 UNIT/ML Kwikpen INJECT 17 UNITS TWICE DAILY AS DIRECTED 09/16/19  Yes [name], MD  Multiple  Vitamin (MULTIVITAMIN WITH MINERALS) TABS tablet Take 1 tablet by mouth daily.   Yes [provider]  NOVOFINE PLUS 32G X 4 MM MISC USE TWICE A DAY AS DIRECTED 04/06/19  Yes [name], MD  Omega-3 Fatty Acids (FISH OIL) 1200 MG CAPS Take 1,200 mg by mouth daily.   Yes [provider]  pantoprazole (PROTONIX) 40 MG tablet TAKE ONE TABLET BY MOUTH EVERY DAY 01/09/19  Yes [name], MD  rosuvastatin (CRESTOR) 40 MG tablet TAKE ONE TABLET EVERY DAY 08/11/19  Yes [name], MD  triamcinolone cream (KENALOG) 0.1 % Apply 1 application topically 2 (two) times daily. 08/12/19  Yes [name], MD  docusate sodium (COLACE) 100 MG capsule Take 1 capsule (100 mg total) by mouth 2 (two) times daily as needed for mild constipation. 06/19/18   Lysle Pearl, Isami, DO  insulin lispro protamine-lispro (HUMALOG 75/25 MIX) (75-25) 100 UNIT/ML SUSP injection Inject into the skin. Inject 19 units in the morning and 18 at night    [provider]  meclizine (ANTIVERT) 12.5 MG tablet Take 1 tablet (12.5 mg total) by mouth 3 (three) times daily as needed for dizziness. Patient not taking: Reported on 09/25/2019 09/19/19   Thornell Mule, MD  polyethylene glycol powder (GLYCOLAX/MIRALAX) powder Take 17 g by mouth daily as needed for mild constipation. 07/29/18   [name], MD    Family History Family History  Problem Relation Age of Onset  . Diabetes Mother   . Dementia Mother   . Heart disease Mother   . Cancer Mother        lung  . Cancer Father        colon  . Stroke Daughter   . Hyperlipidemia Daughter   . Breast cancer Neg Hx     Social History Social History   Tobacco Use  . Smoking status: Never Smoker  . Smokeless tobacco: Never Used  Substance Use Topics  . Alcohol use: No    Alcohol/week: 0.0 standard drinks  . Drug use: No     Allergies   Warfarin and related   Review of Systems Review of Systems  Constitutional: Negative for  chills and fever.  HENT: Negative for ear pain and sore throat.   Eyes: Negative for pain and visual disturbance.  Respiratory: Negative for cough and shortness of breath.   Cardiovascular: Negative for chest pain and palpitations.  Gastrointestinal: Negative for abdominal pain, diarrhea, nausea and vomiting.  Genitourinary: Negative for dysuria and hematuria.  Musculoskeletal: Negative for arthralgias and back pain.  Skin: Negative for color change and rash.  Neurological: Positive for dizziness and weakness. Negative for seizures, syncope, facial asymmetry, speech difficulty and numbness.  All other systems reviewed and are negative.    Physical Exam Triage Vital Signs ED Triage Vitals  Enc Vitals Group     BP      Pulse      Resp      Temp      Temp src  SpO2      Weight      Height      Head Circumference      Peak Flow      Pain Score      Pain Loc      Pain Edu?      Excl. in [place]?    No data found.  Updated Vital Signs BP (!) 173/81 (BP Location: Right Arm)   Pulse 80   Temp 98.7 F (37.1 C) (Oral)   Resp 18   Ht 5\' 6"  (1.676 m)   Wt 119 lb (54 kg)   SpO2 97%   BMI 19.21 kg/m   Visual Acuity Right Eye Distance:   Left Eye Distance:   Bilateral Distance:    Right Eye Near:   Left Eye Near:    Bilateral Near:     Physical Exam Vitals and nursing note reviewed.  Constitutional:      General: She is not in acute distress.    Appearance: She is well-developed. She is not ill-appearing.  HENT:     Head: Normocephalic and atraumatic.     Right Ear: Tympanic membrane normal.     Left Ear: Tympanic membrane normal.     Mouth/Throat:     Mouth: Mucous membranes are moist.     Pharynx: Oropharynx is clear.  Eyes:     Conjunctiva/sclera: Conjunctivae normal.  Cardiovascular:     Rate and Rhythm: Normal rate and regular rhythm.     Heart sounds: No murmur.  Pulmonary:     Effort: Pulmonary effort is normal. No respiratory distress.     Breath  sounds: Normal breath sounds. No wheezing or rhonchi.  Abdominal:     General: Bowel sounds are normal.     Palpations: Abdomen is soft.     Tenderness: There is no abdominal tenderness. There is no guarding or rebound.  Musculoskeletal:     Cervical back: Neck supple.  Skin:    General: Skin is warm and dry.     Findings: No rash.  Neurological:     General: No focal deficit present.     Mental Status: She is alert.     Sensory: No sensory deficit.     Motor: No weakness.     Gait: Gait normal.     Comments: Ambulatory with walker.   Psychiatric:        Mood and Affect: Mood normal.        Behavior: Behavior normal.      UC Treatments / Results  Labs (all labs ordered are listed, but only abnormal results are displayed) Labs Reviewed  POCT FASTING CBG [facility] MANUAL ENTRY - Abnormal; Notable for the following components:      Result Value   POCT Glucose ([facility]) 191 (*)    All other components within normal limits    EKG   Radiology No results found.  Procedures Procedures (including critical care time)  Medications Ordered in UC Medications - No data to display  Initial Impression / Assessment and Plan / UC Course  I have reviewed the triage vital signs and the nursing notes.  Pertinent labs & imaging results that were available during my care of the patient were reviewed by me and considered in my medical decision making (see chart for details).   Dizziness.  EKG shows sinus rhythm, rate 93, no ST elevation, compared to previous from 09/17/2019.  Instructed patient and her daughter to go to the emergency department if she  has another episode like the one this morning.  Also discussed that she should go to the emergency department if she has other symptoms such as chest pain, shortness of breath, or other concerns.  Discussed with patient that her blood pressure is elevated today needs to be rechecked by her PCP in 2 to 4 weeks.  Patient and her daughter agree to this  plan of care.     Final Clinical Impressions(s) / UC Diagnoses   Final diagnoses:  Dizziness and giddiness     Discharge Instructions     Go to the emergency department if you have another episode of weakness or dizziness;  Or if you develop new symptoms such as chest pain, shortness of breath, or other concerning symptoms.    Your blood pressure is elevated today at 173/81.  Please have this rechecked by your primary care provider in 2-4 weeks.         ED Prescriptions    None     PDMP not reviewed this encounter.   Sharion Balloon, NP 10/05/19 [phone]

## 2019-10-07 DIAGNOSIS — R55 Syncope and collapse: Secondary | ICD-10-CM | POA: Diagnosis not present

## 2019-10-07 DIAGNOSIS — E860 Dehydration: Secondary | ICD-10-CM | POA: Diagnosis not present

## 2019-10-07 DIAGNOSIS — E1122 Type 2 diabetes mellitus with diabetic chronic kidney disease: Secondary | ICD-10-CM | POA: Diagnosis not present

## 2019-10-07 DIAGNOSIS — I10 Essential (primary) hypertension: Secondary | ICD-10-CM | POA: Diagnosis not present

## 2019-10-07 DIAGNOSIS — N1831 Chronic kidney disease, stage 3a: Secondary | ICD-10-CM | POA: Diagnosis not present

## 2019-10-07 DIAGNOSIS — N179 Acute kidney failure, unspecified: Secondary | ICD-10-CM | POA: Diagnosis not present

## 2019-10-08 ENCOUNTER — Other Ambulatory Visit: Payer: Self-pay | Admitting: *Deleted

## 2019-10-08 NOTE — Patient Outreach (Signed)
[facility]) Care Management  10/08/2019  Laurie Harris [birth year] [phone]   Call placed to member's daughter [name] to follow up on member's current health condition and to complete initial assessment.  Report member has been improving however she did visit the urgent care on Monday with complaints of dizziness.  She was assessed and discharged back home, daughter report upon arrival back home she realized that member had taken both her am and pm meds at the same time.  Daughter is monitoring her medications closer since this event.  She had virtual visit with PCP 2 weeks ago, completed heart monitor study yesterday.  Follow up appointment next week to review results, anticipating a referral to cardiologist.  She report member's blood pressure, blood sugar, and weights have all been stable.  Denies any concerns at this time, will follow up within the next month.  [facility] CM Care Plan Problem One     Most Recent Value  Care Plan Problem One  Risk for fall related to syncope episodes as evidenced by recent admission  Role Documenting the Problem One  Care Management [place] for Problem One  Active  [facility] Long Term Goal   Member will not report any falls within the next 45 days  [facility] Long Term Goal Start Date  09/25/19  Interventions for Problem One Long Term Goal  Reviewed plan for follow up to review heart monitor results  [facility] CM Short Term Goal #1   Member will report completion of heart monitoring over the next 2 weeks  [facility] CM Short Term Goal #1 Start Date  09/25/19  [facility] CM Short Term Goal #1 Met Date  10/08/19  [facility] CM Short Term Goal #2   Daughter will report compliane with medications over the next 2 weeks  [facility] CM Short Term Goal #2 Start Date  09/25/19  Interventions for Short Term Goal #2  Discussed medication management with daughter, interventions to decrease risk of error     Valente David, RN, MSN [place]  Manager [phone]

## 2019-10-09 DIAGNOSIS — E1122 Type 2 diabetes mellitus with diabetic chronic kidney disease: Secondary | ICD-10-CM | POA: Diagnosis not present

## 2019-10-09 DIAGNOSIS — R55 Syncope and collapse: Secondary | ICD-10-CM | POA: Diagnosis not present

## 2019-10-09 DIAGNOSIS — E860 Dehydration: Secondary | ICD-10-CM | POA: Diagnosis not present

## 2019-10-09 DIAGNOSIS — I10 Essential (primary) hypertension: Secondary | ICD-10-CM | POA: Diagnosis not present

## 2019-10-09 DIAGNOSIS — N1831 Chronic kidney disease, stage 3a: Secondary | ICD-10-CM | POA: Diagnosis not present

## 2019-10-09 DIAGNOSIS — N179 Acute kidney failure, unspecified: Secondary | ICD-10-CM | POA: Diagnosis not present

## 2019-10-11 NOTE — Addendum Note (Signed)
Addended by: Caryl Bis, Shron Ozer G on: 10/11/2019 10:50 AM   Modules accepted: Orders

## 2019-10-12 ENCOUNTER — Telehealth: Payer: Self-pay | Admitting: Family Medicine

## 2019-10-12 ENCOUNTER — Other Ambulatory Visit: Payer: Self-pay | Admitting: Family Medicine

## 2019-10-12 DIAGNOSIS — R55 Syncope and collapse: Secondary | ICD-10-CM | POA: Diagnosis not present

## 2019-10-12 DIAGNOSIS — E1122 Type 2 diabetes mellitus with diabetic chronic kidney disease: Secondary | ICD-10-CM | POA: Diagnosis not present

## 2019-10-12 DIAGNOSIS — N1831 Chronic kidney disease, stage 3a: Secondary | ICD-10-CM | POA: Diagnosis not present

## 2019-10-12 DIAGNOSIS — E860 Dehydration: Secondary | ICD-10-CM | POA: Diagnosis not present

## 2019-10-12 DIAGNOSIS — N179 Acute kidney failure, unspecified: Secondary | ICD-10-CM | POA: Diagnosis not present

## 2019-10-12 DIAGNOSIS — I10 Essential (primary) hypertension: Secondary | ICD-10-CM | POA: Diagnosis not present

## 2019-10-12 NOTE — Telephone Encounter (Signed)
Followed up with daughter Jeani Hawking [phone] per request. Daughter is interested in physician capturing a baseline to determine if patient has dementia. Appointment previously scheduled with pcp 10/14/19. Will edit appointment note. Deferred to pcp for follow up.  Daughter would also like a response from message this morning regarding patient's blood sugar. Note resent to cma and pcp for follow up.

## 2019-10-12 NOTE — Telephone Encounter (Signed)
Patients's daughter Jeani Hawking requests guidance with medications. Please call [phone]. See message below. Message routed to [place].

## 2019-10-12 NOTE — Telephone Encounter (Signed)
I called and spoke with the daughter of the patient and informed her that there is no change to the patients regimen and she wil be seen on 10/14/19. Daughter understood.  Lana Flaim,cma

## 2019-10-12 NOTE — Telephone Encounter (Signed)
Pt's daughter called and states that she had to call EMS on 10/11/2019 in the AM for pt whose sugar had dropped after breakfast and insulin. She states that pt thinks she is supposed to take 18 units but box only reads 17 units. EMS wanted her to have appt today. Pt's daughter is aware that there is an appt set up for 10/14/19 and pt seems fine this morning. She would like a call back today just to discuss medication, etc.

## 2019-10-12 NOTE — Telephone Encounter (Signed)
Pt's daughter would like to speak with you about some concerns from last phone visit. She states that it is just a follow up.

## 2019-10-12 NOTE — Telephone Encounter (Signed)
Patients daughter is sending her BS readings by fax in a minute and I will get them to you.  Zahli Vetsch,cma

## 2019-10-12 NOTE — Telephone Encounter (Signed)
Can you find out what her glucose was when it was low?  What has her glucose been running at other times?

## 2019-10-12 NOTE — Telephone Encounter (Signed)
Pt's daughter called and states that she had to call EMS on 10/11/2019 in the AM for pt whose sugar had dropped after breakfast and insulin. She states that pt thinks she is supposed to take 18 units but box only reads 17 units. EMS wanted her to have appt today. Pt's daughter is aware that there is an appt set up for 10/14/19 and pt seems fine this morning. She would like a call back today just to discuss medication, etc. Alejo Beamer,cma

## 2019-10-12 NOTE — Telephone Encounter (Signed)
Based on her readings I do not think we need to change anything prior to her visit on the 10th. They should monitor her glucose and if it goes below 70 she should eat something and contact us.

## 2019-10-14 ENCOUNTER — Encounter: Payer: Self-pay | Admitting: Family Medicine

## 2019-10-14 ENCOUNTER — Ambulatory Visit (INDEPENDENT_AMBULATORY_CARE_PROVIDER_SITE_OTHER): Payer: Medicare Other | Admitting: Family Medicine

## 2019-10-14 VITALS — BP 140/80 | HR 87 | Temp 97.1°F | Ht 66.0 in | Wt 121.4 lb

## 2019-10-14 DIAGNOSIS — E1129 Type 2 diabetes mellitus with other diabetic kidney complication: Secondary | ICD-10-CM

## 2019-10-14 DIAGNOSIS — N1831 Chronic kidney disease, stage 3a: Secondary | ICD-10-CM

## 2019-10-14 DIAGNOSIS — R55 Syncope and collapse: Secondary | ICD-10-CM | POA: Diagnosis not present

## 2019-10-14 DIAGNOSIS — N179 Acute kidney failure, unspecified: Secondary | ICD-10-CM | POA: Diagnosis not present

## 2019-10-14 DIAGNOSIS — Z794 Long term (current) use of insulin: Secondary | ICD-10-CM

## 2019-10-14 DIAGNOSIS — Z111 Encounter for screening for respiratory tuberculosis: Secondary | ICD-10-CM | POA: Diagnosis not present

## 2019-10-14 DIAGNOSIS — R413 Other amnesia: Secondary | ICD-10-CM | POA: Diagnosis not present

## 2019-10-14 NOTE — Patient Instructions (Addendum)
Nice to see you. I sent a message to cardiology regarding your heart monitor and the results. We will get a little blood work today. Please decrease your insulin to 17 units twice daily.  If you continue to have issues with your blood sugars please contact us. Please let us know if you would like to try the Aricept for your memory.  Donepezil tablets What is this medicine? DONEPEZIL (doe NEP e zil) is used to treat mild to moderate dementia caused by Alzheimer's disease. This medicine may be used for other purposes; ask your health care provider or pharmacist if you have questions. COMMON BRAND NAME(S): Aricept What should I tell my health care provider before I take this medicine? They need to know if you have any of these conditions:  asthma or other lung disease  difficulty passing urine  head injury  heart disease  history of irregular heartbeat  liver disease  seizures (convulsions)  stomach or intestinal disease, ulcers or stomach bleeding  an unusual or allergic reaction to donepezil, other medicines, foods, dyes, or preservatives  pregnant or trying to get pregnant  breast-feeding How should I use this medicine? Take this medicine by mouth with a glass of water. Follow the directions on the prescription label. You may take this medicine with or without food. Take this medicine at regular intervals. This medicine is usually taken before bedtime. Do not take it more often than directed. Continue to take your medicine even if you feel better. Do not stop taking except on your doctor's advice. If you are taking the 23 mg donepezil tablet, swallow it whole; do not cut, crush, or chew it. Talk to your pediatrician regarding the use of this medicine in children. Special care may be needed. Overdosage: If you think you have taken too much of this medicine contact a poison control center or emergency room at once. NOTE: This medicine is only for you. Do not share this medicine  with others. What if I miss a dose? If you miss a dose, take it as soon as you can. If it is almost time for your next dose, take only that dose, do not take double or extra doses. What may interact with this medicine? Do not take this medicine with any of the following medications:  certain medicines for fungal infections like itraconazole, fluconazole, posaconazole, and voriconazole  cisapride  dextromethorphan; quinidine  dronedarone  pimozide  quinidine  thioridazine This medicine may also interact with the following medications:  antihistamines for allergy, cough and cold  atropine  bethanechol  carbamazepine  certain medicines for bladder problems like oxybutynin, tolterodine  certain medicines for Parkinson's disease like benztropine, trihexyphenidyl  certain medicines for stomach problems like dicyclomine, hyoscyamine  certain medicines for travel sickness like scopolamine  dexamethasone  dofetilide  ipratropium  NSAIDs, medicines for pain and inflammation, like ibuprofen or naproxen  other medicines for Alzheimer's disease  other medicines that prolong the QT interval (cause an abnormal heart rhythm)  phenobarbital  phenytoin  rifampin, rifabutin or rifapentine  ziprasidone This list may not describe all possible interactions. Give your health care provider a list of all the medicines, herbs, non-prescription drugs, or dietary supplements you use. Also tell them if you smoke, drink alcohol, or use illegal drugs. Some items may interact with your medicine. What should I watch for while using this medicine? Visit your doctor or health care professional for regular checks on your progress. Check with your doctor or health care professional if your  symptoms do not get better or if they get worse. You may get drowsy or dizzy. Do not drive, use machinery, or do anything that needs mental alertness until you know how this drug affects you. What side  effects may I notice from receiving this medicine? Side effects that you should report to your doctor or health care professional as soon as possible:  allergic reactions like skin rash, itching or hives, swelling of the face, lips, or tongue  feeling faint or lightheaded, falls  loss of bladder control  seizures  signs and symptoms of a dangerous change in heartbeat or heart rhythm like chest pain; dizziness; fast or irregular heartbeat; palpitations; feeling faint or lightheaded, falls; breathing problems  signs and symptoms of infection like fever or chills; cough; sore throat; pain or trouble passing urine  signs and symptoms of liver injury like dark yellow or brown urine; general ill feeling or flu-like symptoms; light-colored stools; loss of appetite; nausea; right upper belly pain; unusually weak or tired; yellowing of the eyes or skin  slow heartbeat or palpitations  unusual bleeding or bruising  vomiting Side effects that usually do not require medical attention (report to your doctor or health care professional if they continue or are bothersome):  diarrhea, especially when starting treatment  headache  loss of appetite  muscle cramps  nausea  stomach upset This list may not describe all possible side effects. Call your doctor for medical advice about side effects. You may report side effects to FDA at 1-800-FDA-1088. Where should I keep my medicine? Keep out of reach of children. Store at room temperature between 15 and 30 degrees C (59 and 86 degrees F). Throw away any unused medicine after the expiration date. NOTE: This sheet is a summary. It may not cover all possible information. If you have questions about this medicine, talk to your doctor, pharmacist, or health care provider.  2020 Elsevier/Gold Standard (2018-08-11 10:33:41)

## 2019-10-14 NOTE — Progress Notes (Signed)
Tommi Rumps, MD Phone: [phone]  Laurie Harris is a 79 y.o. female who presents today for follow-up.  Syncope: Patient notes no additional vertigo or syncope symptoms.  She did try meclizine though that made the vertigo worse.  She notes occasionally she has seen double though that has been going on since she had her stroke.  She does follow with optometry.  She did have a heart monitor in place and completed that though they had not heard any results yet.  Evaluation in the hospital was fairly extensive with carotid Dopplers, brain imaging, and echo all unremarkable for cause  Memory difficulty: She has issues remembering names.  Does not voice other memory difficulties currently.  Slums scoring 24 with high school education.  Diabetes: They note her CBGs have dropped on 3 occasions.  Most recently was 127 before eating breakfast at 7 AM.  Then it was 77 at 8:15 AM.  Her daughter notes she has a blank stare on her face and does respond when this occurs though they are simple responses.  She had been on 18 units of insulin twice daily though her prescription was for 17 units twice daily.  Over the last day she is switched to 17 units twice daily.  Social History   Tobacco Use  Smoking Status Never Smoker  Smokeless Tobacco Never Used     ROS see history of present illness  Objective  Physical Exam Vitals:   10/14/19 1445  BP: 140/80  Pulse: 87  Temp: (!) 97.1 F (36.2 C)  SpO2: 99%    BP Readings from Last 3 Encounters:  10/14/19 140/80  10/05/19 (!) 173/81  09/19/19 (!) 170/69   Wt Readings from Last 3 Encounters:  10/14/19 121 lb 6.4 oz (55.1 kg)  10/05/19 119 lb (54 kg)  09/25/19 118 lb (53.5 kg)    Physical Exam Constitutional:      General: She is not in acute distress.    Appearance: She is not diaphoretic.  Cardiovascular:     Rate and Rhythm: Normal rate and regular rhythm.     Heart sounds: Normal heart sounds.  Pulmonary:     Effort: Pulmonary  effort is normal.     Breath sounds: Normal breath sounds.  Skin:    General: Skin is warm and dry.  Neurological:     Mental Status: She is alert.      Assessment/Plan: Please see individual problem list.  Type II diabetes mellitus with renal manifestations (HCC) Some subjective low CBGs.  Discussed continuing with 17 units of insulin twice daily and see if that makes a difference.  If they continue to have issues with this they will let us know.  Syncope No additional symptoms.  She did complete a heart monitor though they have not heard the results.  I will forward my message to her cardiologist to see if they have the results.  Memory difficulty Mild memory issues noted.  They are transitioning her to an assisted living.  We will complete her paperwork.  Discussed monitoring her symptoms and we could consider medication though the medications will not reverse her memory loss and may only help keep it stable for a period of time.   Orders Placed This Encounter  Procedures  . PPD    Order Specific Question:   Has patient ever tested positive?    Answer:   No    No orders of the defined types were placed in this encounter.   This visit occurred  during the SARS-CoV-2 public health emergency.  Safety protocols were in place, including screening questions prior to the visit, additional usage of staff PPE, and extensive cleaning of exam room while observing appropriate contact time as indicated for disinfecting solutions.    Tommi Rumps, MD [place]

## 2019-10-15 ENCOUNTER — Encounter: Payer: Self-pay | Admitting: Family Medicine

## 2019-10-15 ENCOUNTER — Ambulatory Visit: Payer: Medicare Other | Attending: Internal Medicine

## 2019-10-15 DIAGNOSIS — Z20822 Contact with and (suspected) exposure to covid-19: Secondary | ICD-10-CM

## 2019-10-15 DIAGNOSIS — R413 Other amnesia: Secondary | ICD-10-CM | POA: Insufficient documentation

## 2019-10-15 DIAGNOSIS — R4189 Other symptoms and signs involving cognitive functions and awareness: Secondary | ICD-10-CM | POA: Insufficient documentation

## 2019-10-15 LAB — BASIC METABOLIC PANEL
BUN: 39 mg/dL — ABNORMAL HIGH (ref 6–23)
CO2: 25 mEq/L (ref 19–32)
Calcium: 9.7 mg/dL (ref 8.4–10.5)
Chloride: 107 mEq/L (ref 96–112)
Creatinine, Ser: 1.29 mg/dL — ABNORMAL HIGH (ref 0.40–1.20)
GFR: 39.66 mL/min — ABNORMAL LOW (ref >60.00)
Glucose, Bld: 232 mg/dL — ABNORMAL HIGH (ref 70–99)
Potassium: 4.4 mEq/L (ref 3.5–5.1)
Sodium: 139 mEq/L (ref 135–145)

## 2019-10-15 NOTE — Assessment & Plan Note (Signed)
Some subjective low CBGs.  Discussed continuing with 17 units of insulin twice daily and see if that makes a difference.  If they continue to have issues with this they will let us know.

## 2019-10-15 NOTE — Assessment & Plan Note (Signed)
No additional symptoms.  She did complete a heart monitor though they have not heard the results.  I will forward my message to her cardiologist to see if they have the results.

## 2019-10-15 NOTE — Assessment & Plan Note (Signed)
Mild memory issues noted.  They are transitioning her to an assisted living.  We will complete her paperwork.  Discussed monitoring her symptoms and we could consider medication though the medications will not reverse her memory loss and may only help keep it stable for a period of time.

## 2019-10-16 ENCOUNTER — Ambulatory Visit: Payer: Medicare Other | Admitting: Lab

## 2019-10-16 ENCOUNTER — Telehealth: Payer: Self-pay | Admitting: Family Medicine

## 2019-10-16 ENCOUNTER — Other Ambulatory Visit: Payer: Self-pay

## 2019-10-16 ENCOUNTER — Other Ambulatory Visit: Payer: Self-pay | Admitting: *Deleted

## 2019-10-16 DIAGNOSIS — N179 Acute kidney failure, unspecified: Secondary | ICD-10-CM | POA: Diagnosis not present

## 2019-10-16 DIAGNOSIS — R55 Syncope and collapse: Secondary | ICD-10-CM | POA: Diagnosis not present

## 2019-10-16 DIAGNOSIS — I10 Essential (primary) hypertension: Secondary | ICD-10-CM | POA: Diagnosis not present

## 2019-10-16 DIAGNOSIS — N1831 Chronic kidney disease, stage 3a: Secondary | ICD-10-CM | POA: Diagnosis not present

## 2019-10-16 DIAGNOSIS — E860 Dehydration: Secondary | ICD-10-CM | POA: Diagnosis not present

## 2019-10-16 DIAGNOSIS — Z111 Encounter for screening for respiratory tuberculosis: Secondary | ICD-10-CM

## 2019-10-16 DIAGNOSIS — E1122 Type 2 diabetes mellitus with diabetic chronic kidney disease: Secondary | ICD-10-CM | POA: Diagnosis not present

## 2019-10-16 LAB — TB SKIN TEST
Induration: NEGATIVE mm
TB Skin Test: NEGATIVE

## 2019-10-16 LAB — NOVEL CORONAVIRUS, NAA: SARS-CoV-2, NAA: NOT DETECTED

## 2019-10-16 NOTE — Telephone Encounter (Signed)
Do we have a FL2 form that is ready fro Laurie Harris. She said they are bringing pt in today for a TB reading and wanted to make sure we have the form? Please advise.

## 2019-10-16 NOTE — Telephone Encounter (Signed)
Patient came in for a TB reading and was giving FMLA forms and a copy was sent to scan.  Laurie Harris,cma

## 2019-10-16 NOTE — Progress Notes (Signed)
Pt was in office today for PPD reading which was Negative.

## 2019-10-19 ENCOUNTER — Telehealth: Payer: Self-pay | Admitting: Family Medicine

## 2019-10-19 DIAGNOSIS — N1831 Chronic kidney disease, stage 3a: Secondary | ICD-10-CM | POA: Diagnosis not present

## 2019-10-19 DIAGNOSIS — R29898 Other symptoms and signs involving the musculoskeletal system: Secondary | ICD-10-CM

## 2019-10-19 DIAGNOSIS — N179 Acute kidney failure, unspecified: Secondary | ICD-10-CM | POA: Diagnosis not present

## 2019-10-19 DIAGNOSIS — I10 Essential (primary) hypertension: Secondary | ICD-10-CM | POA: Diagnosis not present

## 2019-10-19 DIAGNOSIS — E1122 Type 2 diabetes mellitus with diabetic chronic kidney disease: Secondary | ICD-10-CM | POA: Diagnosis not present

## 2019-10-19 DIAGNOSIS — R55 Syncope and collapse: Secondary | ICD-10-CM | POA: Diagnosis not present

## 2019-10-19 DIAGNOSIS — E860 Dehydration: Secondary | ICD-10-CM | POA: Diagnosis not present

## 2019-10-19 NOTE — Telephone Encounter (Signed)
Please let the patient and her daughter know that I heard back from Dr. Rockey Situ.  He noted there was not much noted on the monitor.  She did have 5 runs of supraventricular tachycardia with the longest lasting 6 beats.  It does not appear that she triggered any events.  I do not believe that there is any intervention that needs to be done based on these findings.

## 2019-10-19 NOTE — Telephone Encounter (Signed)
Pt needs a rx for walker with seat and wheels-Please advise

## 2019-10-19 NOTE — Telephone Encounter (Signed)
-----   Message from [name], MD sent at 10/17/2019  3:30 PM EST ----- Not much noted on the monitor Thx TG  Event monitor  Normal sinus rhythm avg HR of 74 bpm.   5 Supraventricular Tachycardia runs occurred, the run with the fastest interval lasting 4 beats with a max rate of 190 bpm, the longest lasting 6 beats with an avg rate of 127 bpm.   Isolated SVEs were rare (<1.0%), SVE Couplets were rare (<1.0%), and no SVE Triplets were present. Isolated VEs were rare (<1.0%), VE Couplets were rare (<1.0%), and no VE Triplets were present. Ventricular Bigeminy and Trigeminy were present.  No patient triggered events reported   ----- Message ----- From: [name], MD Sent: 10/15/2019  10:36 AM EST To: [name], MD  Hi Dr Rockey Situ,   I saw Laurie Harris for follow-up. She completed a zio monitor last week and her daughter wanted to check on the results of this. I advised it could take some time for the physician to read the results, though wanted to see if you all had the results yet. Thanks for your help.   Laurie Harris

## 2019-10-20 ENCOUNTER — Other Ambulatory Visit: Payer: Self-pay

## 2019-10-20 ENCOUNTER — Telehealth: Payer: Self-pay | Admitting: Family Medicine

## 2019-10-20 ENCOUNTER — Emergency Department
Admission: EM | Admit: 2019-10-20 | Discharge: 2019-10-20 | Disposition: A | Discharge status: Home / Self Care | Payer: Medicare Other | Attending: Student | Admitting: Student

## 2019-10-20 ENCOUNTER — Encounter: Payer: Self-pay | Admitting: Intensive Care

## 2019-10-20 DIAGNOSIS — R531 Weakness: Secondary | ICD-10-CM | POA: Insufficient documentation

## 2019-10-20 DIAGNOSIS — Z79899 Other long term (current) drug therapy: Secondary | ICD-10-CM | POA: Insufficient documentation

## 2019-10-20 DIAGNOSIS — I10 Essential (primary) hypertension: Secondary | ICD-10-CM | POA: Diagnosis not present

## 2019-10-20 DIAGNOSIS — Z8673 Personal history of transient ischemic attack (TIA), and cerebral infarction without residual deficits: Secondary | ICD-10-CM | POA: Diagnosis not present

## 2019-10-20 DIAGNOSIS — Z794 Long term (current) use of insulin: Secondary | ICD-10-CM | POA: Diagnosis not present

## 2019-10-20 DIAGNOSIS — R000 Tachycardia, unspecified: Secondary | ICD-10-CM | POA: Diagnosis not present

## 2019-10-20 DIAGNOSIS — I129 Hypertensive chronic kidney disease with stage 1 through stage 4 chronic kidney disease, or unspecified chronic kidney disease: Secondary | ICD-10-CM | POA: Diagnosis not present

## 2019-10-20 DIAGNOSIS — R350 Frequency of micturition: Secondary | ICD-10-CM | POA: Diagnosis not present

## 2019-10-20 DIAGNOSIS — E1122 Type 2 diabetes mellitus with diabetic chronic kidney disease: Secondary | ICD-10-CM | POA: Insufficient documentation

## 2019-10-20 DIAGNOSIS — N1831 Chronic kidney disease, stage 3a: Secondary | ICD-10-CM | POA: Diagnosis not present

## 2019-10-20 LAB — URINALYSIS, COMPLETE (UACMP) WITH MICROSCOPIC
Bacteria, UA: NONE SEEN
Bilirubin Urine: NEGATIVE
Glucose, UA: >=500 mg/dL — AB
Hgb urine dipstick: NEGATIVE
Ketones, ur: NEGATIVE mg/dL
Leukocytes,Ua: NEGATIVE
Nitrite: NEGATIVE
Protein, ur: NEGATIVE mg/dL
Specific Gravity, Urine: 1.015 (ref 1.005–1.030)
pH: 5 (ref 5.0–8.0)

## 2019-10-20 LAB — CBC WITH DIFFERENTIAL/PLATELET
Abs Immature Granulocytes: 0.02 10*3/uL (ref 0.00–0.07)
Basophils Absolute: 0 10*3/uL (ref 0.0–0.1)
Basophils Relative: 1 %
Eosinophils Absolute: 0.1 10*3/uL (ref 0.0–0.5)
Eosinophils Relative: 1 %
HCT: 37.2 % (ref 36.0–46.0)
Hemoglobin: 12.7 g/dL (ref 12.0–15.0)
Immature Granulocytes: 0 %
Lymphocytes Relative: 6 %
Lymphs Abs: 0.4 10*3/uL — ABNORMAL LOW (ref 0.7–4.0)
MCH: 34.3 pg — ABNORMAL HIGH (ref 26.0–34.0)
MCHC: 34.1 g/dL (ref 30.0–36.0)
MCV: 100.5 fL — ABNORMAL HIGH (ref 80.0–100.0)
Monocytes Absolute: 0.3 10*3/uL (ref 0.1–1.0)
Monocytes Relative: 5 %
Neutro Abs: 5.3 10*3/uL (ref 1.7–7.7)
Neutrophils Relative %: 87 %
Platelets: 161 10*3/uL (ref 150–400)
RBC: 3.7 MIL/uL — ABNORMAL LOW (ref 3.87–5.11)
RDW: 12.5 % (ref 11.5–15.5)
WBC: 6 10*3/uL (ref 4.0–10.5)
nRBC: 0 % (ref 0.0–0.2)

## 2019-10-20 LAB — BASIC METABOLIC PANEL
Anion gap: 10 (ref 5–15)
BUN: 29 mg/dL — ABNORMAL HIGH (ref 8–23)
CO2: 23 mmol/L (ref 22–32)
Calcium: 9.5 mg/dL (ref 8.9–10.3)
Chloride: 103 mmol/L (ref 98–111)
Creatinine, Ser: 1.11 mg/dL — ABNORMAL HIGH (ref 0.44–1.00)
GFR calc Af Amer: 54 mL/min — ABNORMAL LOW (ref >60)
GFR calc non Af Amer: 47 mL/min — ABNORMAL LOW (ref >60)
Glucose, Bld: 234 mg/dL — ABNORMAL HIGH (ref 70–99)
Potassium: 4.9 mmol/L (ref 3.5–5.1)
Sodium: 136 mmol/L (ref 135–145)

## 2019-10-20 NOTE — ED Notes (Signed)
Lab contacted to run patients orders for CBC and BMP.

## 2019-10-20 NOTE — Telephone Encounter (Signed)
I am happy to place referral if they want a referral.  Please see if they have noticed a difference with her sugar dropping since she has decreased her insulin to 17 units twice daily.  If she is not I would suggest we drop to 16 units twice daily and see if that makes a difference.

## 2019-10-20 NOTE — ED Triage Notes (Signed)
Pt arrived by EMS from home with c/o weakness this AM while in bathroom and she had her daughter help her to the floor. No LOC. Denies hitting head. EMS reports she is suppose to be moving into [facility] Monday. A&O x4 upon arrival to ER.

## 2019-10-20 NOTE — Telephone Encounter (Signed)
Printed. Please place in my sign folder.  

## 2019-10-20 NOTE — Telephone Encounter (Signed)
I called and spoke with the patient's daughter and she understood the message from you and Dr. Rockey Situ, She stated that the patient was in the hospital for a couple days because she had an episode with her sugar and she had just had her 2nd covid vaccine and they seem to think the vaccine was reated to her sugar, she is being discharged today, patient daughter asked about a referral to endocrinology and I informed her if they mention this at discharge and she needs that referral to let us know.  Verdis Bassette,cma

## 2019-10-20 NOTE — Telephone Encounter (Signed)
The daughter stated that she wanted the walker with the seat because after a while she gets weak and tired and she wants one with a seat because she is using the one her mother had with the tennis balls and she has to stop and lean against something.  Mareon Robinette,cma

## 2019-10-20 NOTE — ED Notes (Signed)
Patient ambulated in room to toilet. Patient does well with 1 assist

## 2019-10-20 NOTE — Discharge Instructions (Addendum)
Thank you for letting us take care of you in the emergency department today.   Please continue to take any regular, prescribed medications.  Continue to eat a healthy diet and stay well-hydrated.  Please follow up with: - Your primary care doctor to review your ER visit and follow up on your symptoms.   Please return to the ER for any new or worsening symptoms.

## 2019-10-20 NOTE — Telephone Encounter (Signed)
FYI-Pt was sent to ER yesterday due to being very weak and lethargic. Willette Cluster just wanted PCP to look over notes from ED and to be advised.

## 2019-10-20 NOTE — Telephone Encounter (Signed)
Please find out a specific reason that she needs this.  I need to tie a diagnosis to this.

## 2019-10-20 NOTE — ED Provider Notes (Signed)
[facility] Emergency Department Provider Note  ____________________________________________   First MD Initiated Contact with Patient 10/20/19 [id number]     (approximate)  I have reviewed the triage vital signs and the nursing notes.  History  Chief Complaint Weakness    HPI Laurie Harris is a 79 y.o. female past medical history as below, who presents to the emergency department for generalized weakness.  Patient states she got up this morning as usual, went to check her blood sugar and developed a feeling of generalized weakness and feeling "out of it".  She sat down to prevent any injuries.  There were no related falls or head trauma.  No loss of consciousness.  She called for her daughter to assist her, who activated EMS.  Patient denies any lateralizing or focal weakness.  No associated visual changes, slurred speech, facial droop, numbness during this event.  No associated chest pain, palpitations, shortness of breath, lightheadedness, dizziness.  She does report some increased urinary frequency, though attributes this to working on being better hydrated.  She does note that she received her second COVID vaccine yesterday, and wonders if this is the etiology of her symptoms. She has not yet eaten today.   Past Medical Hx Past Medical History:  Diagnosis Date  . Carotid arterial disease ([place])    a. 09/2016 Carotid U/S: R carotid bifurcation dzs of 50-69%, L carotid bifurcation dzs of <50%. Patent vertebral arteries w/ anegrade flow.  . Cerebellar hemorrhage ([place]) 03/16/2015   Left Cerebellar Hemorrhage- See Care Everywhere Seen by [facility]    . Chicken pox   . CKD (chronic kidney disease), stage III    functioning at 46%  . Diabetes mellitus without complication ([place])   . Hemorrhagic stroke ([place]) 12/31/2014   a. in the setting of warfarin therapy.  Marland Kitchen History of appendicitis 08/07/2016  . Hyperlipidemia   . Hypertension   . Syncope    a. 09/2016  Echo: EF 55-60%, no rwma, Gr1 DD.    Problem List Patient Active Problem List   Diagnosis Date Noted  . Memory difficulty 10/15/2019  . Hypertension   . Rash 08/12/2019  . Grief 07/10/2019  . Hirsutism 10/29/2018  . Constipation 07/29/2018  . Status post laparoscopic cholecystectomy 06/17/2018  . Osteoporosis 12/26/2017  . Aorto-iliac atherosclerosis ([place]) 09/20/2017  . Macrocytosis without anemia 09/20/2017  . Syncope 09/10/2016  . Carotid artery disease ([place]) 06/19/2016  . Acute renal failure superimposed on stage 3a chronic kidney disease ([place]) 05/10/2016  . Type II diabetes mellitus with renal manifestations ([place]) 03/16/2015  . Benign essential HTN 03/16/2015  . Hyperlipidemia 03/16/2015  . Adenomatous polyp of colon 06/04/2014  . Carotid artery occlusion without infarction 08/18/2013    Past Surgical Hx Past Surgical History:  Procedure Laterality Date  . ABCESS DRAINAGE  06/14/2016   appendix  . APPENDECTOMY  06/13/2016  . CATARACT EXTRACTION W/PHACO Left 09/04/2017   Procedure: CATARACT EXTRACTION PHACO AND INTRAOCULAR LENS PLACEMENT ([place]) COMPLICATED LEFT DIABETIC;  Surgeon: Leandrew Koyanagi, MD;  Location: [facility];  Service: Ophthalmology;  Laterality: Left;  Diabetic - insulin  . CATARACT EXTRACTION W/PHACO Right 10/02/2017   Procedure: CATARACT EXTRACTION PHACO AND INTRAOCULAR LENS PLACEMENT ([place]) COMPLICATED  RIGHT DIABETIC;  Surgeon: Leandrew Koyanagi, MD;  Location: [facility];  Service: Ophthalmology;  Laterality: Right;  Diabetic - insulin  . CHOLECYSTECTOMY N/A 06/17/2018   Procedure: LAPAROSCOPIC CHOLECYSTECTOMY;  Surgeon: Benjamine Sprague, DO;  Location: [facility];  Service: General;  Laterality: N/A;  . COLONOSCOPY    . NECK SURGERY      Medications Prior to Admission medications   Medication Sig Start Date End Date Taking? Authorizing Provider  aspirin EC 81 MG tablet Take 81 mg by mouth daily.    [provider]    benazepril (LOTENSIN) 5 MG tablet TAKE ONE TABLET EVERY DAY 05/12/19   [name], MD  Calcium Carbonate (CALCIUM 600 PO) Take 1 tablet by mouth daily.     [provider]  cholecalciferol (VITAMIN D) 1000 units tablet Take 2,000 Units by mouth daily.    [provider]  docusate sodium (COLACE) 100 MG capsule Take 1 capsule (100 mg total) by mouth 2 (two) times daily as needed for mild constipation. 06/19/18   Lysle Pearl, Isami, DO  fenofibrate 160 MG tablet TAKE ONE TABLET BY MOUTH EVERY DAY 09/16/19   [name], MD  Insulin Lispro Prot & Lispro (HUMALOG MIX 75/25 KWIKPEN) (75-25) 100 UNIT/ML Kwikpen INJECT 17 UNITS TWICE DAILY AS DIRECTED 09/16/19   [name], MD  insulin lispro protamine-lispro (HUMALOG 75/25 MIX) (75-25) 100 UNIT/ML SUSP injection Inject into the skin. Inject 19 units in the morning and 18 at night    [provider]  Multiple Vitamin (MULTIVITAMIN WITH MINERALS) TABS tablet Take 1 tablet by mouth daily.    [provider]  NOVOFINE PLUS 32G X 4 MM MISC USE TWICE A DAY AS DIRECTED 04/06/19   [name], MD  Omega-3 Fatty Acids (FISH OIL) 1200 MG CAPS Take 1,200 mg by mouth daily.    [provider]  pantoprazole (PROTONIX) 40 MG tablet TAKE ONE TABLET BY MOUTH EVERY DAY 10/12/19   [name], MD  polyethylene glycol powder (GLYCOLAX/MIRALAX) powder Take 17 g by mouth daily as needed for mild constipation. 07/29/18   [name], MD  rosuvastatin (CRESTOR) 40 MG tablet TAKE ONE TABLET EVERY DAY 08/11/19   [name], MD  triamcinolone cream (KENALOG) 0.1 % Apply 1 application topically 2 (two) times daily. 08/12/19   [name], MD    Allergies Warfarin and related  Family Hx Family History  Problem Relation Age of Onset  . Diabetes Mother   . Dementia Mother   . Heart disease Mother   . Cancer Mother        lung  . Cancer Father        colon  . Stroke Daughter   .  Hyperlipidemia Daughter   . Breast cancer Neg Hx     Social Hx Social History   Tobacco Use  . Smoking status: Never Smoker  . Smokeless tobacco: Never Used  Substance Use Topics  . Alcohol use: No    Alcohol/week: 0.0 standard drinks  . Drug use: No     Review of Systems  Constitutional: Negative for fever, chills.  Positive for generalized weakness. Eyes: Negative for visual changes. ENT: Negative for sore throat. Cardiovascular: Negative for chest pain. Respiratory: Negative for shortness of breath. Gastrointestinal: Negative for nausea, vomiting.  Genitourinary: Negative for dysuria. Musculoskeletal: Negative for leg swelling. Skin: Negative for rash. Neurological: Negative for headaches.   Physical Exam  Vital Signs: ED Triage Vitals  Enc Vitals Group     BP 10/20/19 0805 (!) 149/68     Pulse Rate 10/20/19 0805 100     Resp 10/20/19 0805 20     Temp 10/20/19 0805 99 F (37.2 C)     Temp  Source 10/20/19 0805 Oral     SpO2 10/20/19 0805 97 %     Weight 10/20/19 0802 121 lb 6.4 oz (55.1 kg)     Height 10/20/19 0802 5\' 6"  (1.676 m)     Head Circumference --      Peak Flow --      Pain Score 10/20/19 0802 0     Pain Loc --      Pain Edu? --      Excl. in [place]? --     Constitutional: Alert and oriented to person, place, time, DOB.  Head: Normocephalic. Atraumatic. Eyes: Conjunctivae clear. Sclera anicteric. Nose: No congestion. No rhinorrhea. Mouth/Throat: Wearing mask.  Neck: No stridor.   Cardiovascular: Normal rate, regular rhythm. Extremities well perfused. Respiratory: Normal respiratory effort.  Lungs CTAB. Gastrointestinal: Soft. Non-tender. Non-distended.  Musculoskeletal: No lower extremity edema. No deformities. Neurologic:  Normal speech and language. No gross focal neurologic deficits are appreciated. Alert and oriented.  Face symmetric.  Tongue midline.  Cranial nerves II through XII intact. UE and LE strength 5/5 and symmetric. UE and LE SILT.   Skin: Skin is warm, dry and intact. No rash noted. Psychiatric: Mood and affect are appropriate for situation.  EKG  Personally reviewed.  Obtained 8:04 AM.  Rate: 105 Rhythm: sinus Axis: leftward Intervals: WNL No acute ischemic changes No STEMI    Procedures  Procedure(s) performed (including critical care):  Procedures   Initial Impression / Assessment and Plan / ED Course  79 y.o. female who presents to the ED for an episode of generalized weakness  Ddx: vaccine side effect, hypoglycemia, UTI, anemia, electrolyte abnormality, arrhythmia. Doubt central etiology given generalized symptoms at lack of/no associated neurological symptoms.   On chart review, patient recently completed Zio patch monitoring, which was largely reassuring aside from 5 very brief SVT runs, longest lasting 6 beats.   Will evaluate with labs, urine studies, EKG.  EKG without acute ischemic changes or evidence of arrhythmia.  No evidence of UTI.  No anemia.  Electrolytes without actionable derangements.  Creatinine near baseline. She has tolerated PO and ambulated in the ED without difficulty. Given negative work-up, patient stable for discharge with outpatient follow-up.  Patient agreeable w/ plan. Given return precautions.   Final Clinical Impression(s) / ED Diagnosis  Final diagnoses:  Generalized weakness       Note:  This document was prepared using Dragon voice recognition software and may include unintentional dictation errors.   Lilia Pro., MD 10/20/19 1153

## 2019-10-20 NOTE — Telephone Encounter (Signed)
I called and spoke to the daughter and she stated that  maybe it should not ne decreased because she is still dropping she states she have been giving her orange juice before her meals because it has been dropping.  She is going to [facility] on Monday and she thinks maybe its because she just does not know a lot about diabetes and they can monitor her better.  The orange juice has been helping stay at a good number.  Gae Bon, cma

## 2019-10-20 NOTE — Telephone Encounter (Signed)
Noted.  I would suggest they decrease the dose to 16 units twice daily and see if that makes a bigger difference.  They should contact us Friday with her blood sugar readings.  Thanks.

## 2019-10-21 LAB — URINE CULTURE

## 2019-10-21 NOTE — Telephone Encounter (Signed)
All documents were faxed to [facility] to deliver walker to home.  Jazper Nikolai,cma

## 2019-10-21 NOTE — Telephone Encounter (Signed)
I called and spoke with the patient's daughter and informed her of the decrease to her insulin to 16 units twice daily.  She understood.  Maliek Schellhorn,cma

## 2019-10-22 DIAGNOSIS — E860 Dehydration: Secondary | ICD-10-CM | POA: Diagnosis not present

## 2019-10-22 DIAGNOSIS — N179 Acute kidney failure, unspecified: Secondary | ICD-10-CM | POA: Diagnosis not present

## 2019-10-22 DIAGNOSIS — N1831 Chronic kidney disease, stage 3a: Secondary | ICD-10-CM | POA: Diagnosis not present

## 2019-10-22 DIAGNOSIS — I10 Essential (primary) hypertension: Secondary | ICD-10-CM | POA: Diagnosis not present

## 2019-10-22 DIAGNOSIS — R55 Syncope and collapse: Secondary | ICD-10-CM | POA: Diagnosis not present

## 2019-10-22 DIAGNOSIS — E1122 Type 2 diabetes mellitus with diabetic chronic kidney disease: Secondary | ICD-10-CM | POA: Diagnosis not present

## 2019-10-23 ENCOUNTER — Ambulatory Visit: Payer: Medicare Other

## 2019-10-23 ENCOUNTER — Encounter: Payer: Self-pay | Admitting: Family Medicine

## 2019-10-27 ENCOUNTER — Telehealth: Payer: Self-pay | Admitting: Family Medicine

## 2019-10-27 DIAGNOSIS — R296 Repeated falls: Secondary | ICD-10-CM | POA: Diagnosis not present

## 2019-10-27 DIAGNOSIS — M810 Age-related osteoporosis without current pathological fracture: Secondary | ICD-10-CM | POA: Diagnosis not present

## 2019-10-27 DIAGNOSIS — E785 Hyperlipidemia, unspecified: Secondary | ICD-10-CM | POA: Diagnosis not present

## 2019-10-27 NOTE — Telephone Encounter (Signed)
[place] received two different orders. I gave what was in chart for Humalog Kwik pen,

## 2019-10-27 NOTE — Telephone Encounter (Signed)
[place] with pharmacy at [facility] needs clarification on humalog directions. Please call back asap [phone]

## 2019-10-28 DIAGNOSIS — E119 Type 2 diabetes mellitus without complications: Secondary | ICD-10-CM | POA: Diagnosis not present

## 2019-10-29 ENCOUNTER — Telehealth: Payer: Self-pay | Admitting: Family Medicine

## 2019-10-29 ENCOUNTER — Telehealth: Payer: Self-pay

## 2019-10-29 ENCOUNTER — Other Ambulatory Visit: Payer: Medicare Other

## 2019-10-29 DIAGNOSIS — E1122 Type 2 diabetes mellitus with diabetic chronic kidney disease: Secondary | ICD-10-CM | POA: Diagnosis not present

## 2019-10-29 DIAGNOSIS — I10 Essential (primary) hypertension: Secondary | ICD-10-CM | POA: Diagnosis not present

## 2019-10-29 DIAGNOSIS — R55 Syncope and collapse: Secondary | ICD-10-CM | POA: Diagnosis not present

## 2019-10-29 DIAGNOSIS — E860 Dehydration: Secondary | ICD-10-CM | POA: Diagnosis not present

## 2019-10-29 DIAGNOSIS — N1831 Chronic kidney disease, stage 3a: Secondary | ICD-10-CM | POA: Diagnosis not present

## 2019-10-29 DIAGNOSIS — N179 Acute kidney failure, unspecified: Secondary | ICD-10-CM | POA: Diagnosis not present

## 2019-10-29 NOTE — Telephone Encounter (Signed)
I called and spoke with the nurse of the patient at [facility] and she stated that the provider verified the insulin dosage with their pharmacist and she wanted me to call [facility] and ask about the bubble packets for the patient medication, after calling the pharmacy [facility] they only needed the dose for the calcium and I informed them it was 600 by mouth daily and they understood.  Kaylee Wombles,cam

## 2019-10-29 NOTE — Telephone Encounter (Signed)
Pt daughter called and wanted to know if a letter could be sent over to [facility] to see if her moms insulin could be give at 7am and not when the do medication rounds  Please contact Sula Soda at [phone] before sending the letter

## 2019-10-30 DIAGNOSIS — E860 Dehydration: Secondary | ICD-10-CM | POA: Diagnosis not present

## 2019-10-30 DIAGNOSIS — Z7982 Long term (current) use of aspirin: Secondary | ICD-10-CM | POA: Diagnosis not present

## 2019-10-30 DIAGNOSIS — M810 Age-related osteoporosis without current pathological fracture: Secondary | ICD-10-CM | POA: Diagnosis not present

## 2019-10-30 DIAGNOSIS — D7589 Other specified diseases of blood and blood-forming organs: Secondary | ICD-10-CM | POA: Diagnosis not present

## 2019-10-30 DIAGNOSIS — I700 Atherosclerosis of aorta: Secondary | ICD-10-CM | POA: Diagnosis not present

## 2019-10-30 DIAGNOSIS — R55 Syncope and collapse: Secondary | ICD-10-CM | POA: Diagnosis not present

## 2019-10-30 DIAGNOSIS — I10 Essential (primary) hypertension: Secondary | ICD-10-CM | POA: Diagnosis not present

## 2019-10-30 DIAGNOSIS — N1831 Chronic kidney disease, stage 3a: Secondary | ICD-10-CM | POA: Diagnosis not present

## 2019-10-30 DIAGNOSIS — N179 Acute kidney failure, unspecified: Secondary | ICD-10-CM | POA: Diagnosis not present

## 2019-10-30 DIAGNOSIS — Z9049 Acquired absence of other specified parts of digestive tract: Secondary | ICD-10-CM | POA: Diagnosis not present

## 2019-10-30 DIAGNOSIS — I708 Atherosclerosis of other arteries: Secondary | ICD-10-CM | POA: Diagnosis not present

## 2019-10-30 DIAGNOSIS — Z9181 History of falling: Secondary | ICD-10-CM | POA: Diagnosis not present

## 2019-10-30 DIAGNOSIS — Z794 Long term (current) use of insulin: Secondary | ICD-10-CM | POA: Diagnosis not present

## 2019-10-30 DIAGNOSIS — I6529 Occlusion and stenosis of unspecified carotid artery: Secondary | ICD-10-CM | POA: Diagnosis not present

## 2019-10-30 DIAGNOSIS — D126 Benign neoplasm of colon, unspecified: Secondary | ICD-10-CM | POA: Diagnosis not present

## 2019-10-30 DIAGNOSIS — E785 Hyperlipidemia, unspecified: Secondary | ICD-10-CM | POA: Diagnosis not present

## 2019-10-30 DIAGNOSIS — Z8673 Personal history of transient ischemic attack (TIA), and cerebral infarction without residual deficits: Secondary | ICD-10-CM | POA: Diagnosis not present

## 2019-10-30 DIAGNOSIS — E1122 Type 2 diabetes mellitus with diabetic chronic kidney disease: Secondary | ICD-10-CM | POA: Diagnosis not present

## 2019-10-30 NOTE — Telephone Encounter (Signed)
Faxed Order for insulin to [facility] today.  Annaston Upham,cma

## 2019-10-30 NOTE — Telephone Encounter (Signed)
Signed. Please fax.

## 2019-10-31 NOTE — Telephone Encounter (Signed)
Error.  Laurie Harris,cma  

## 2019-11-03 DIAGNOSIS — N1831 Chronic kidney disease, stage 3a: Secondary | ICD-10-CM | POA: Diagnosis not present

## 2019-11-03 DIAGNOSIS — R55 Syncope and collapse: Secondary | ICD-10-CM | POA: Diagnosis not present

## 2019-11-03 DIAGNOSIS — E860 Dehydration: Secondary | ICD-10-CM | POA: Diagnosis not present

## 2019-11-03 DIAGNOSIS — E1122 Type 2 diabetes mellitus with diabetic chronic kidney disease: Secondary | ICD-10-CM | POA: Diagnosis not present

## 2019-11-03 DIAGNOSIS — N179 Acute kidney failure, unspecified: Secondary | ICD-10-CM | POA: Diagnosis not present

## 2019-11-03 DIAGNOSIS — I10 Essential (primary) hypertension: Secondary | ICD-10-CM | POA: Diagnosis not present

## 2019-11-05 ENCOUNTER — Other Ambulatory Visit: Payer: Self-pay | Admitting: *Deleted

## 2019-11-05 DIAGNOSIS — E7849 Other hyperlipidemia: Secondary | ICD-10-CM | POA: Diagnosis not present

## 2019-11-05 DIAGNOSIS — Z79899 Other long term (current) drug therapy: Secondary | ICD-10-CM | POA: Diagnosis not present

## 2019-11-05 DIAGNOSIS — E559 Vitamin D deficiency, unspecified: Secondary | ICD-10-CM | POA: Diagnosis not present

## 2019-11-05 DIAGNOSIS — E119 Type 2 diabetes mellitus without complications: Secondary | ICD-10-CM | POA: Diagnosis not present

## 2019-11-05 DIAGNOSIS — D518 Other vitamin B12 deficiency anemias: Secondary | ICD-10-CM | POA: Diagnosis not present

## 2019-11-05 LAB — CBC AND DIFFERENTIAL
HCT: 34 — AB (ref 36–46)
Hemoglobin: 12 (ref 12.0–16.0)
Neutrophils Absolute: 57
Platelets: 188 (ref 150–399)
WBC: 4.7

## 2019-11-05 LAB — VITAMIN B12: Vitamin B-12: 539

## 2019-11-05 LAB — BASIC METABOLIC PANEL
BUN: 24 — AB (ref 4–21)
CO2: 26 — AB (ref 13–22)
Chloride: 105 (ref 99–108)
Creatinine: 1.2 — AB (ref <=1.1)
Glucose: 138
Potassium: 4.6 (ref 3.4–5.3)
Sodium: 139 (ref 137–147)

## 2019-11-05 LAB — HEPATIC FUNCTION PANEL: Bilirubin, Total: 0.2

## 2019-11-05 LAB — LIPID PANEL
Cholesterol: 122 (ref 0–200)
HDL: 37 (ref 35–70)
LDL Cholesterol: 52
Triglycerides: 167 — AB (ref 40–160)

## 2019-11-05 LAB — COMPREHENSIVE METABOLIC PANEL
Albumin: 3.1 — AB (ref 3.5–5.0)
Calcium: 7.4 — AB (ref 8.7–10.7)
Globulin: 2.8

## 2019-11-05 LAB — VITAMIN D 25 HYDROXY (VIT D DEFICIENCY, FRACTURES): Vit D, 25-Hydroxy: 85.9

## 2019-11-05 LAB — IRON,TIBC AND FERRITIN PANEL
Ferritin: 248
Iron: 109

## 2019-11-05 LAB — TSH: TSH: 1.63 (ref <=5.90)

## 2019-11-05 LAB — CBC: RBC: 3.3 — AB (ref 3.87–5.11)

## 2019-11-05 LAB — HEMOGLOBIN A1C: Hemoglobin A1C: 6.6

## 2019-11-05 NOTE — Patient Outreach (Signed)
[facility]) Care Management  11/05/2019  MALESA CRICK 09-02-1941 [id number]   Call placed to member's daughter to follow up on member's current medical status, no answer.  HIPAA compliant voice message left, will follow up within the next 3-4 business days.  Valente David, [place], MSN [place] [phone]

## 2019-11-10 ENCOUNTER — Other Ambulatory Visit: Payer: Self-pay | Admitting: *Deleted

## 2019-11-10 DIAGNOSIS — N179 Acute kidney failure, unspecified: Secondary | ICD-10-CM | POA: Diagnosis not present

## 2019-11-10 DIAGNOSIS — R55 Syncope and collapse: Secondary | ICD-10-CM | POA: Diagnosis not present

## 2019-11-10 DIAGNOSIS — I10 Essential (primary) hypertension: Secondary | ICD-10-CM | POA: Diagnosis not present

## 2019-11-10 DIAGNOSIS — E1122 Type 2 diabetes mellitus with diabetic chronic kidney disease: Secondary | ICD-10-CM | POA: Diagnosis not present

## 2019-11-10 DIAGNOSIS — E860 Dehydration: Secondary | ICD-10-CM | POA: Diagnosis not present

## 2019-11-10 DIAGNOSIS — N1831 Chronic kidney disease, stage 3a: Secondary | ICD-10-CM | POA: Diagnosis not present

## 2019-11-10 NOTE — Patient Outreach (Signed)
[facility]) Care Management  11/10/2019  Laurie Harris September 29, 1940 [id number]   Outreach attempt #2, successful.    Call placed to member's daughter to follow up on current medical status.  She report that member has now moved into the ALF [name] and is under the PCP care at the facility.  She state that member was needing more care than what she could provide in the home.  Denies any needs at this time, report member has adjusted well.  Will close case at this time, will notify PCP of case closure.  [facility] CM Care Plan Problem One     Most Recent Value  Care Plan Problem One  Risk for fall related to syncope episodes as evidenced by recent admission  Role Documenting the Problem One  Care Management [place] for Problem One  Active  [facility] Long Term Goal   Member will not report any falls within the next 45 days  [facility] Long Term Goal Start Date  09/25/19  [facility] Long Term Goal Met Date  11/10/19  [facility] CM Short Term Goal #2   Daughter will report compliane with medications over the next 2 weeks  [facility] CM Short Term Goal #2 Start Date  09/25/19  [facility] CM Short Term Goal #2 Met Date  11/10/19     Valente David, RN, MSN [place] [phone]

## 2019-11-11 ENCOUNTER — Ambulatory Visit (INDEPENDENT_AMBULATORY_CARE_PROVIDER_SITE_OTHER): Payer: Medicare Other | Admitting: Family Medicine

## 2019-11-11 ENCOUNTER — Encounter: Payer: Self-pay | Admitting: Family Medicine

## 2019-11-11 ENCOUNTER — Other Ambulatory Visit: Payer: Self-pay

## 2019-11-11 DIAGNOSIS — I10 Essential (primary) hypertension: Secondary | ICD-10-CM

## 2019-11-11 DIAGNOSIS — K219 Gastro-esophageal reflux disease without esophagitis: Secondary | ICD-10-CM | POA: Insufficient documentation

## 2019-11-11 DIAGNOSIS — Z794 Long term (current) use of insulin: Secondary | ICD-10-CM | POA: Diagnosis not present

## 2019-11-11 DIAGNOSIS — E1129 Type 2 diabetes mellitus with other diabetic kidney complication: Secondary | ICD-10-CM

## 2019-11-11 MED ORDER — PANTOPRAZOLE SODIUM 40 MG PO TBEC: 40.0000 mg | DELAYED_RELEASE_TABLET | ORAL | 2 refills | Status: DC

## 2019-11-11 NOTE — Assessment & Plan Note (Signed)
Stable.  Continue current medication

## 2019-11-11 NOTE — Progress Notes (Signed)
Virtual Visit via telephone Note  This visit type was conducted due to national recommendations for restrictions regarding the COVID-19 pandemic (e.g. social distancing).  This format is felt to be most appropriate for this patient at this time.  All issues noted in this document were discussed and addressed.  No physical exam was performed (except for noted visual exam findings with Video Visits).   I connected with Laurie Harris today at 11:00 AM EST by telephone and verified that I am speaking with the correct person using two identifiers. Location patient: home Location provider: work  Persons participating in the virtual visit: patient, provider  I discussed the limitations, risks, security and privacy concerns of performing an evaluation and management service by telephone and the availability of in person appointments. I also discussed with the patient that there may be a patient responsible charge related to this service. The patient expressed understanding and agreed to proceed.  Interactive audio and video telecommunications were attempted between this provider and patient, however failed, due to patient having technical difficulties OR patient did not have access to video capability.  We continued and completed visit with audio only.   Reason for visit: follow-up  HPI: DIABETES Disease Monitoring: Blood Sugar ranges-130 this morning, though has been up to 200 recently  Polyuria/phagia/dipsia- no      Optho- UTD Medications: Compliance- taking humalog mix 75/25 16 u BID Hypoglycemic symptoms- no She has not had any recurrence of her prior episodes of low glucose.  HYPERTENSION  Disease Monitoring  Home BP Monitoring <150/70  Chest pain- no    Dyspnea- no Medications  Compliance-  Taking benazepril.  Edema- no  GERD:   Reflux symptoms: no   Abd pain: no   Blood in stool: no  Dysphagia: no   EGD: no history of this  Medication: on protonix, has been on this for many  years, has not tried to come off of it previously  The patient notes she has had a hard time adjusting to her current living facility.  She moved from her daughter's home into an assisted living.  She notes there is so much uncertainty there.  They do not communicate as much as they should.  The mealtimes are different.  She is not getting the food that she would typically eat either.  ROS: See pertinent positives and negatives per HPI.  Past Medical History:  Diagnosis Date  . Carotid arterial disease ([employer])    a. 09/2016 Carotid U/S: R carotid bifurcation dzs of 50-69%, L carotid bifurcation dzs of <50%. Patent vertebral arteries w/ anegrade flow.  . Cerebellar hemorrhage ([place]) 03/16/2015   Left Cerebellar Hemorrhage- See Care Everywhere Seen by [facility]    . Chicken pox   . CKD (chronic kidney disease), stage III    functioning at 46%  . Diabetes mellitus without complication ([place])   . Hemorrhagic stroke ([place]) 12/31/2014   a. in the setting of warfarin therapy.  Marland Kitchen History of appendicitis 08/07/2016  . Hyperlipidemia   . Hypertension   . Syncope    a. 09/2016 Echo: EF 55-60%, no rwma, Gr1 DD.    Past Surgical History:  Procedure Laterality Date  . ABCESS DRAINAGE  06/14/2016   appendix  . APPENDECTOMY  06/13/2016  . CATARACT EXTRACTION W/PHACO Left 09/04/2017   Procedure: CATARACT EXTRACTION PHACO AND INTRAOCULAR LENS PLACEMENT ([place]) COMPLICATED LEFT DIABETIC;  Surgeon: Leandrew Koyanagi, MD;  Location: [place];  Service: Ophthalmology;  Laterality: Left;  Diabetic -  insulin  . CATARACT EXTRACTION W/PHACO Right 10/02/2017   Procedure: CATARACT EXTRACTION PHACO AND INTRAOCULAR LENS PLACEMENT ([place]) COMPLICATED  RIGHT DIABETIC;  Surgeon: Leandrew Koyanagi, MD;  Location: [place];  Service: Ophthalmology;  Laterality: Right;  Diabetic - insulin  . CHOLECYSTECTOMY N/A 06/17/2018   Procedure: LAPAROSCOPIC CHOLECYSTECTOMY;  Surgeon: Benjamine Sprague, DO;   Location: [facility];  Service: General;  Laterality: N/A;  . COLONOSCOPY    . NECK SURGERY      Family History  Problem Relation Age of Onset  . Diabetes Mother   . Dementia Mother   . Heart disease Mother   . Cancer Mother        lung  . Cancer Father        colon  . Stroke Daughter   . Hyperlipidemia Daughter   . Breast cancer Neg Hx     SOCIAL HX: Non-smoker   Current Outpatient Medications:  .  aspirin EC 81 MG tablet, Take 81 mg by mouth daily., Disp: , Rfl:  .  benazepril (LOTENSIN) 5 MG tablet, TAKE ONE TABLET EVERY DAY, Disp: 90 tablet, Rfl: 2 .  Calcium Carbonate (CALCIUM 600 PO), Take 1 tablet by mouth daily. , Disp: , Rfl:  .  cholecalciferol (VITAMIN D) 1000 units tablet, Take 2,000 Units by mouth daily., Disp: , Rfl:  .  docusate sodium (COLACE) 100 MG capsule, Take 1 capsule (100 mg total) by mouth 2 (two) times daily as needed for mild constipation., Disp: 10 capsule, Rfl: 0 .  fenofibrate 160 MG tablet, TAKE ONE TABLET BY MOUTH EVERY DAY, Disp: 90 tablet, Rfl: 1 .  Insulin Lispro Prot & Lispro (HUMALOG MIX 75/25 KWIKPEN) (75-25) 100 UNIT/ML Kwikpen, INJECT 17 UNITS TWICE DAILY AS DIRECTED, Disp: 15 mL, Rfl: 2 .  insulin lispro protamine-lispro (HUMALOG 75/25 MIX) (75-25) 100 UNIT/ML SUSP injection, Inject into the skin. Inject 19 units in the morning and 18 at night, Disp: , Rfl:  .  Multiple Vitamin (MULTIVITAMIN WITH MINERALS) TABS tablet, Take 1 tablet by mouth daily., Disp: , Rfl:  .  NOVOFINE PLUS 32G X 4 MM MISC, USE TWICE A DAY AS DIRECTED, Disp: 100 each, Rfl: 6 .  Omega-3 Fatty Acids (FISH OIL) 1200 MG CAPS, Take 1,200 mg by mouth daily., Disp: , Rfl:  .  pantoprazole (PROTONIX) 40 MG tablet, Take 1 tablet (40 mg total) by mouth every other day., Disp: 45 tablet, Rfl: 2 .  polyethylene glycol powder (GLYCOLAX/MIRALAX) powder, Take 17 g by mouth daily as needed for mild constipation., Disp: 500 g, Rfl: 0 .  rosuvastatin (CRESTOR) 40 MG tablet, TAKE ONE  TABLET EVERY DAY, Disp: 90 tablet, Rfl: 3 .  triamcinolone cream (KENALOG) 0.1 %, Apply 1 application topically 2 (two) times daily., Disp: 30 g, Rfl: 0  EXAM: This was a telehealth telephone visit and thus no physical exam was completed.   ASSESSMENT AND PLAN:  Discussed the following assessment and plan:  Benign essential HTN Stable.  Continue current medication.  Type II diabetes mellitus with renal manifestations (HCC) Glucose has trended up with decreasing her insulin dose.  She has not had any additional low sugars or episodes suspected to be related to hypoglycemia.  She will continue to monitor her sugars at the facility and will follow up in 2 weeks to see what her sugar has been doing.  We could consider increasing her insulin dose slightly if she continues to have elevated sugars at that time.  GERD (gastroesophageal reflux disease)  Asymptomatic patient with long-term use of Protonix.  We will try to taper her off.  A letter with instructions will be sent to her facility.  If she has any onset of reflux with this taper she will let us know when she can go back to her current dose.   Offered support regarding her issues with her facility.  Discussed that it may just take some time for her to settle in.  I encouraged her to discuss her concerns with her daughter.  Discussed that there is the potential that these things could improve with time though if not improving and if she is unhappy they could look for another facility.  No orders of the defined types were placed in this encounter.   Meds ordered this encounter  Medications  . pantoprazole (PROTONIX) 40 MG tablet    Sig: Take 1 tablet (40 mg total) by mouth every other day.    Dispense:  45 tablet    Refill:  2     I discussed the assessment and treatment plan with the patient. The patient was provided an opportunity to ask questions and all were answered. The patient agreed with the plan and demonstrated an  understanding of the instructions.   The patient was advised to call back or seek an in-person evaluation if the symptoms worsen or if the condition fails to improve as anticipated.  I provided 21 minutes of non-face-to-face time during this encounter.   Tommi Rumps, MD

## 2019-11-11 NOTE — Assessment & Plan Note (Signed)
Glucose has trended up with decreasing her insulin dose.  She has not had any additional low sugars or episodes suspected to be related to hypoglycemia.  She will continue to monitor her sugars at the facility and will follow up in 2 weeks to see what her sugar has been doing.  We could consider increasing her insulin dose slightly if she continues to have elevated sugars at that time.

## 2019-11-11 NOTE — Assessment & Plan Note (Signed)
Asymptomatic patient with long-term use of Protonix.  We will try to taper her off.  A letter with instructions will be sent to her facility.  If she has any onset of reflux with this taper she will let us know when she can go back to her current dose.

## 2019-11-13 ENCOUNTER — Telehealth: Payer: Self-pay

## 2019-11-18 NOTE — Telephone Encounter (Signed)
Can you see if someone can scan these in so I can review them prior to next week when I cam back in the office?

## 2019-11-19 NOTE — Telephone Encounter (Signed)
Lorriane Shire says it cannot be scanned in the chart so we sent it to your e-mail address for [place]. You can review the readings that way.  Ryshawn Sanzone,ca

## 2019-11-19 NOTE — Telephone Encounter (Signed)
Noted. Thanks. I will review and let you know if anything needs to be done.

## 2019-11-24 DIAGNOSIS — M810 Age-related osteoporosis without current pathological fracture: Secondary | ICD-10-CM | POA: Diagnosis not present

## 2019-11-24 DIAGNOSIS — F5104 Psychophysiologic insomnia: Secondary | ICD-10-CM | POA: Diagnosis not present

## 2019-11-24 DIAGNOSIS — E785 Hyperlipidemia, unspecified: Secondary | ICD-10-CM | POA: Diagnosis not present

## 2019-11-24 DIAGNOSIS — F4321 Adjustment disorder with depressed mood: Secondary | ICD-10-CM | POA: Diagnosis not present

## 2019-11-25 ENCOUNTER — Ambulatory Visit (INDEPENDENT_AMBULATORY_CARE_PROVIDER_SITE_OTHER): Payer: Medicare Other | Admitting: Family Medicine

## 2019-11-25 ENCOUNTER — Encounter: Payer: Self-pay | Admitting: Family Medicine

## 2019-11-25 ENCOUNTER — Other Ambulatory Visit: Payer: Self-pay

## 2019-11-25 DIAGNOSIS — E1129 Type 2 diabetes mellitus with other diabetic kidney complication: Secondary | ICD-10-CM | POA: Diagnosis not present

## 2019-11-25 DIAGNOSIS — K219 Gastro-esophageal reflux disease without esophagitis: Secondary | ICD-10-CM

## 2019-11-25 DIAGNOSIS — G479 Sleep disorder, unspecified: Secondary | ICD-10-CM

## 2019-11-25 DIAGNOSIS — R21 Rash and other nonspecific skin eruption: Secondary | ICD-10-CM

## 2019-11-25 DIAGNOSIS — Z794 Long term (current) use of insulin: Secondary | ICD-10-CM | POA: Diagnosis not present

## 2019-11-25 NOTE — Progress Notes (Signed)
Tommi Rumps, MD Phone: [phone]  Laurie Harris is a 79 y.o. female who presents today for f/u.  Diabetes: Occasionally CBGs have been up over 200 though typically lower than that.  She is currently on 16 units of Humalog 75/25 twice daily.  The Humalog has been progressively decreased given episodes of hypoglycemia.  She has not had any hypoglycemic episodes since decreasing to 16 units twice daily.  Most recent A1c was 6.6 per her report.  No polyuria or polydipsia.  GERD: Patient currently on Protonix every other day.  No reflux symptoms.  No abdominal pain, blood in her stool, or dysphagia.  Sleep difficulty: Patient notes she falls asleep pretty easily though she will wake up after 2 to 3 hours of sleep.  She will be awake for a while and then fall back asleep.  She will nap during the day.  She has had lots of changes with her living arrangements and thinks that may be contributing.  The facility tried to place her on melatonin though she does not want to take anything for this.  Rash: This was on her lower extremities.  She used triamcinolone and this has resolved.  Social History   Tobacco Use  Smoking Status Never Smoker  Smokeless Tobacco Never Used     ROS see history of present illness  Objective  Physical Exam Vitals:   11/25/19 1142  BP: 123/70  Pulse: 75  Temp: (!) 96 F (35.6 C)  SpO2: 97%    BP Readings from Last 3 Encounters:  11/25/19 123/70  10/20/19 132/69  10/14/19 140/80   Wt Readings from Last 3 Encounters:  11/25/19 123 lb (55.8 kg)  11/11/19 120 lb (54.4 kg)  10/20/19 121 lb 6.4 oz (55.1 kg)    Physical Exam Constitutional:      General: She is not in acute distress.    Appearance: She is not diaphoretic.  Cardiovascular:     Rate and Rhythm: Normal rate and regular rhythm.     Heart sounds: Normal heart sounds.  Pulmonary:     Effort: Pulmonary effort is normal.     Breath sounds: Normal breath sounds.  Skin:    General:  Skin is warm and dry.     Comments: No rash noted on lower legs  Neurological:     Mental Status: She is alert.      Assessment/Plan: Please see individual problem list.  GERD (gastroesophageal reflux disease) Asymptomatic.  She will continue Protonix every other day for 2 more weeks and then discontinue.  If she has recurrence of symptoms to let us know.  Type II diabetes mellitus with renal manifestations (HCC) Glucose has increased though she has not had any further hypoglycemic episodes.  We will continue with Humalog 75/25 16 units twice daily for now.  We will ask that her recent labs to be sent to [name] from her facility.  She will follow-up in about 3 months.  Rash Resolved.  Discontinue triamcinolone.  Sleeping difficulty Likely related to change in environment.  She will monitor for now.  Discontinue melatonin.   No orders of the defined types were placed in this encounter.   No orders of the defined types were placed in this encounter.   This visit occurred during the SARS-CoV-2 public health emergency.  Safety protocols were in place, including screening questions prior to the visit, additional usage of staff PPE, and extensive cleaning of exam room while observing appropriate contact time as indicated for disinfecting solutions.  Tommi Rumps, MD [place]

## 2019-11-25 NOTE — Patient Instructions (Signed)
Nice to see you. We will see you back in 3 months.  If your blood sugar trends up further please contact us sooner.

## 2019-11-26 DIAGNOSIS — G479 Sleep disorder, unspecified: Secondary | ICD-10-CM | POA: Insufficient documentation

## 2019-11-26 NOTE — Assessment & Plan Note (Signed)
Resolved.  Discontinue triamcinolone.

## 2019-11-26 NOTE — Assessment & Plan Note (Signed)
Asymptomatic.  She will continue Protonix every other day for 2 more weeks and then discontinue.  If she has recurrence of symptoms to let us know.

## 2019-11-26 NOTE — Assessment & Plan Note (Signed)
Glucose has increased though she has not had any further hypoglycemic episodes.  We will continue with Humalog 75/25 16 units twice daily for now.  We will ask that her recent labs to be sent to [name] from her facility.  She will follow-up in about 3 months.

## 2019-11-26 NOTE — Assessment & Plan Note (Signed)
Likely related to change in environment.  She will monitor for now.  Discontinue melatonin.

## 2019-11-29 ENCOUNTER — Telehealth: Payer: Self-pay | Admitting: Family Medicine

## 2019-11-29 NOTE — Telephone Encounter (Signed)
Labs from facility reviewed. Macrocytosis noted and is chronic. Has been evaluated by hematology previously for this. Calcium low and magnesium low. Plan to recheck in the next 1-2 weeks. Please call to schedule these labs.

## 2019-12-01 ENCOUNTER — Telehealth: Payer: Self-pay | Admitting: Family Medicine

## 2019-12-01 DIAGNOSIS — D518 Other vitamin B12 deficiency anemias: Secondary | ICD-10-CM | POA: Diagnosis not present

## 2019-12-01 DIAGNOSIS — E559 Vitamin D deficiency, unspecified: Secondary | ICD-10-CM | POA: Diagnosis not present

## 2019-12-01 DIAGNOSIS — E038 Other specified hypothyroidism: Secondary | ICD-10-CM | POA: Diagnosis not present

## 2019-12-01 DIAGNOSIS — E119 Type 2 diabetes mellitus without complications: Secondary | ICD-10-CM | POA: Diagnosis not present

## 2019-12-01 MED ORDER — CALCIUM CARBONATE 600 MG PO TABS: 600.0000 mg | ORAL_TABLET | Freq: Every day | ORAL | 3 refills | Status: DC

## 2019-12-01 NOTE — Telephone Encounter (Signed)
Ok to refill 

## 2019-12-01 NOTE — Telephone Encounter (Signed)
Laurie Harris from [facility] said pt's pharmacy said they have sent over the request twice for calcium carbonate for patient and have not received anything. This needs to go to [facility] because she is currently without.

## 2019-12-01 NOTE — Telephone Encounter (Signed)
Sent to pharmacy 

## 2019-12-01 NOTE — Telephone Encounter (Signed)
I called Laurie Harris and they needed an order for labs faxed to [phone].  I put the order on a prescription sheet and it just needs your signature, it is in the sign basket.  Laurie Harris,cma

## 2019-12-02 NOTE — Telephone Encounter (Signed)
I faxed the lab order to [facility] @ [phone] . Himani Corona,cma

## 2019-12-02 NOTE — Telephone Encounter (Signed)
I do not see the order form in the basket. Can you complete another one and give it to me to sign?

## 2019-12-08 ENCOUNTER — Encounter: Payer: Self-pay | Admitting: Emergency Medicine

## 2019-12-08 ENCOUNTER — Other Ambulatory Visit: Payer: Self-pay

## 2019-12-08 ENCOUNTER — Emergency Department: Payer: Medicare Other

## 2019-12-08 ENCOUNTER — Emergency Department
Admission: EM | Admit: 2019-12-08 | Discharge: 2019-12-09 | Disposition: A | Discharge status: Home / Self Care | Payer: Medicare Other | Attending: Emergency Medicine | Admitting: Emergency Medicine

## 2019-12-08 DIAGNOSIS — Z7982 Long term (current) use of aspirin: Secondary | ICD-10-CM | POA: Diagnosis not present

## 2019-12-08 DIAGNOSIS — E1122 Type 2 diabetes mellitus with diabetic chronic kidney disease: Secondary | ICD-10-CM | POA: Insufficient documentation

## 2019-12-08 DIAGNOSIS — I129 Hypertensive chronic kidney disease with stage 1 through stage 4 chronic kidney disease, or unspecified chronic kidney disease: Secondary | ICD-10-CM | POA: Diagnosis not present

## 2019-12-08 DIAGNOSIS — Z79899 Other long term (current) drug therapy: Secondary | ICD-10-CM | POA: Insufficient documentation

## 2019-12-08 DIAGNOSIS — N1831 Chronic kidney disease, stage 3a: Secondary | ICD-10-CM | POA: Insufficient documentation

## 2019-12-08 DIAGNOSIS — Z794 Long term (current) use of insulin: Secondary | ICD-10-CM | POA: Diagnosis not present

## 2019-12-08 DIAGNOSIS — R1032 Left lower quadrant pain: Secondary | ICD-10-CM | POA: Diagnosis not present

## 2019-12-08 DIAGNOSIS — K5732 Diverticulitis of large intestine without perforation or abscess without bleeding: Secondary | ICD-10-CM | POA: Diagnosis not present

## 2019-12-08 DIAGNOSIS — R1084 Generalized abdominal pain: Secondary | ICD-10-CM | POA: Diagnosis not present

## 2019-12-08 DIAGNOSIS — R52 Pain, unspecified: Secondary | ICD-10-CM | POA: Diagnosis not present

## 2019-12-08 DIAGNOSIS — I10 Essential (primary) hypertension: Secondary | ICD-10-CM | POA: Diagnosis not present

## 2019-12-08 DIAGNOSIS — K5730 Diverticulosis of large intestine without perforation or abscess without bleeding: Secondary | ICD-10-CM | POA: Diagnosis not present

## 2019-12-08 LAB — URINALYSIS, COMPLETE (UACMP) WITH MICROSCOPIC
Bacteria, UA: NONE SEEN
Bilirubin Urine: NEGATIVE
Glucose, UA: 50 mg/dL — AB
Hgb urine dipstick: NEGATIVE
Ketones, ur: NEGATIVE mg/dL
Nitrite: NEGATIVE
Protein, ur: NEGATIVE mg/dL
Specific Gravity, Urine: 1.008 (ref 1.005–1.030)
pH: 5 (ref 5.0–8.0)

## 2019-12-08 LAB — COMPREHENSIVE METABOLIC PANEL
ALT: 25 U/L (ref 0–44)
AST: 33 U/L (ref 15–41)
Albumin: 3.7 g/dL (ref 3.5–5.0)
Alkaline Phosphatase: 78 U/L (ref 38–126)
Anion gap: 7 (ref 5–15)
BUN: 30 mg/dL — ABNORMAL HIGH (ref 8–23)
CO2: 23 mmol/L (ref 22–32)
Calcium: 9.4 mg/dL (ref 8.9–10.3)
Chloride: 109 mmol/L (ref 98–111)
Creatinine, Ser: 1.22 mg/dL — ABNORMAL HIGH (ref 0.44–1.00)
GFR calc Af Amer: 48 mL/min — ABNORMAL LOW (ref >60)
GFR calc non Af Amer: 42 mL/min — ABNORMAL LOW (ref >60)
Glucose, Bld: 218 mg/dL — ABNORMAL HIGH (ref 70–99)
Potassium: 4.1 mmol/L (ref 3.5–5.1)
Sodium: 139 mmol/L (ref 135–145)
Total Bilirubin: 0.8 mg/dL (ref 0.3–1.2)
Total Protein: 6.7 g/dL (ref 6.5–8.1)

## 2019-12-08 LAB — CBC
HCT: 39.1 % (ref 36.0–46.0)
Hemoglobin: 12.9 g/dL (ref 12.0–15.0)
MCH: 33.6 pg (ref 26.0–34.0)
MCHC: 33 g/dL (ref 30.0–36.0)
MCV: 101.8 fL — ABNORMAL HIGH (ref 80.0–100.0)
Platelets: 188 10*3/uL (ref 150–400)
RBC: 3.84 MIL/uL — ABNORMAL LOW (ref 3.87–5.11)
RDW: 13.3 % (ref 11.5–15.5)
WBC: 10.8 10*3/uL — ABNORMAL HIGH (ref 4.0–10.5)
nRBC: 0 % (ref 0.0–0.2)

## 2019-12-08 LAB — LIPASE, BLOOD: Lipase: 67 U/L — ABNORMAL HIGH (ref 11–51)

## 2019-12-08 MED ORDER — IOHEXOL 300 MG/ML  SOLN
75.0000 mL | Freq: Once | INTRAMUSCULAR | Status: AC | PRN
  Administered 2019-12-08: 75 mL via INTRAVENOUS

## 2019-12-08 MED ORDER — IOHEXOL 9 MG/ML PO SOLN
500.0000 mL | Freq: Once | ORAL | Status: DC | PRN
  Administered 2019-12-08: 500 mL via ORAL

## 2019-12-08 NOTE — ED Provider Notes (Signed)
[facility] Emergency Department Provider Note   ____________________________________________   First MD Initiated Contact with Patient 12/08/19 2145     (approximate)  I have reviewed the triage vital signs and the nursing notes.   HISTORY  Chief Complaint Left lower abdominal pain   HPI Laurie Harris is a 78 y.o. female here for evaluation left lower abdominal pain  Patient was doing well until that this afternoon she started having bouts of a very sharp pain that comes in her left lower abdomen.  It comes and goes in waves.  Currently the pain is gone but it happened about 30 minutes ago again which led her to call EMS  No chest pain no fevers or chills.  No Covid exposure.  No trouble breathing.  Denies any changes in urinary or bowel habits.  She is not vomiting she is not nauseated.  She is not sure what is happening but it seems to be a sharp pain keeps intermittently occurring in her left lower abdomen throughout the course the day today  Reports previous appendiceal surgery   Past Medical History:  Diagnosis Date  . Carotid arterial disease ([place])    a. 09/2016 Carotid U/S: R carotid bifurcation dzs of 50-69%, L carotid bifurcation dzs of <50%. Patent vertebral arteries w/ anegrade flow.  . Cerebellar hemorrhage ([place]) 03/16/2015   Left Cerebellar Hemorrhage- See Care Everywhere Seen by [facility]    . Chicken pox   . CKD (chronic kidney disease), stage III    functioning at 46%  . Diabetes mellitus without complication ([place])   . Hemorrhagic stroke ([place]) 12/31/2014   a. in the setting of warfarin therapy.  Marland Kitchen History of appendicitis 08/07/2016  . Hyperlipidemia   . Hypertension   . Syncope    a. 09/2016 Echo: EF 55-60%, no rwma, Gr1 DD.    Patient Active Problem List   Diagnosis Date Noted  . Sleeping difficulty 11/26/2019  . GERD (gastroesophageal reflux disease) 11/11/2019  . Memory difficulty 10/15/2019  . Hypertension   .  Rash 08/12/2019  . Grief 07/10/2019  . Hirsutism 10/29/2018  . Constipation 07/29/2018  . Status post laparoscopic cholecystectomy 06/17/2018  . Osteoporosis 12/26/2017  . Aorto-iliac atherosclerosis ([place]) 09/20/2017  . Macrocytosis without anemia 09/20/2017  . Syncope 09/10/2016  . Carotid artery disease ([place]) 06/19/2016  . Acute renal failure superimposed on stage 3a chronic kidney disease ([place]) 05/10/2016  . Type II diabetes mellitus with renal manifestations ([place]) 03/16/2015  . Benign essential HTN 03/16/2015  . Hyperlipidemia 03/16/2015  . Adenomatous polyp of colon 06/04/2014  . Carotid artery occlusion without infarction 08/18/2013    Past Surgical History:  Procedure Laterality Date  . ABCESS DRAINAGE  06/14/2016   appendix  . APPENDECTOMY  06/13/2016  . CATARACT EXTRACTION W/PHACO Left 09/04/2017   Procedure: CATARACT EXTRACTION PHACO AND INTRAOCULAR LENS PLACEMENT ([place]) COMPLICATED LEFT DIABETIC;  Surgeon: Leandrew Koyanagi, MD;  Location: [place];  Service: Ophthalmology;  Laterality: Left;  Diabetic - insulin  . CATARACT EXTRACTION W/PHACO Right 10/02/2017   Procedure: CATARACT EXTRACTION PHACO AND INTRAOCULAR LENS PLACEMENT ([place]) COMPLICATED  RIGHT DIABETIC;  Surgeon: Leandrew Koyanagi, MD;  Location: [place];  Service: Ophthalmology;  Laterality: Right;  Diabetic - insulin  . CHOLECYSTECTOMY N/A 06/17/2018   Procedure: LAPAROSCOPIC CHOLECYSTECTOMY;  Surgeon: Benjamine Sprague, DO;  Location: [facility];  Service: General;  Laterality: N/A;  . COLONOSCOPY    . NECK SURGERY      Prior  to Admission medications   Medication Sig Start Date End Date Taking? Authorizing Provider  aspirin EC 81 MG tablet Take 81 mg by mouth daily.    [provider]  benazepril (LOTENSIN) 5 MG tablet TAKE ONE TABLET EVERY DAY 05/12/19   [name], MD  calcium carbonate (CALCIUM 600) 600 MG TABS tablet Take 1 tablet (600 mg total) by mouth daily. 12/01/19    [name], MD  cholecalciferol (VITAMIN D) 1000 units tablet Take 2,000 Units by mouth daily.    [provider]  docusate sodium (COLACE) 100 MG capsule Take 1 capsule (100 mg total) by mouth 2 (two) times daily as needed for mild constipation. 06/19/18   Lysle Pearl, Isami, DO  fenofibrate 160 MG tablet TAKE ONE TABLET BY MOUTH EVERY DAY 09/16/19   [name], MD  Insulin Lispro Prot & Lispro (HUMALOG MIX 75/25 KWIKPEN) (75-25) 100 UNIT/ML Kwikpen INJECT 17 UNITS TWICE DAILY AS DIRECTED 09/16/19   [name], MD  insulin lispro protamine-lispro (HUMALOG 75/25 MIX) (75-25) 100 UNIT/ML SUSP injection Inject into the skin. Inject 19 units in the morning and 18 at night    [provider]  Multiple Vitamin (MULTIVITAMIN WITH MINERALS) TABS tablet Take 1 tablet by mouth daily.    [provider]  NOVOFINE PLUS 32G X 4 MM MISC USE TWICE A DAY AS DIRECTED 04/06/19   [name], MD  Omega-3 Fatty Acids (FISH OIL) 1200 MG CAPS Take 1,200 mg by mouth daily.    [provider]  pantoprazole (PROTONIX) 40 MG tablet Take 1 tablet (40 mg total) by mouth every other day. 11/11/19   [name], MD  polyethylene glycol powder (GLYCOLAX/MIRALAX) powder Take 17 g by mouth daily as needed for mild constipation. 07/29/18   [name], MD  rosuvastatin (CRESTOR) 40 MG tablet TAKE ONE TABLET EVERY DAY 08/11/19   [name], MD    Allergies Warfarin and related  Family History  Problem Relation Age of Onset  . Diabetes Mother   . Dementia Mother   . Heart disease Mother   . Cancer Mother        lung  . Cancer Father        colon  . Stroke Daughter   . Hyperlipidemia Daughter   . Breast cancer Neg Hx     Social History Social History   Tobacco Use  . Smoking status: Never Smoker  . Smokeless tobacco: Never Used  Substance Use Topics  . Alcohol use: No    Alcohol/week: 0.0 standard drinks  . Drug use: No    Review of  Systems Constitutional: No fever/chills Eyes: No visual changes. ENT: No sore throat. Cardiovascular: Denies chest pain. Respiratory: Denies shortness of breath. Gastrointestinal: See HPI.  No areas of swelling.  No known hernias.  Pain does not wrap around her back. Genitourinary: Negative for dysuria.  No blood in her urine. Musculoskeletal: Negative for back pain. Skin: Negative for rash. Neurological: Negative for headaches, areas of focal weakness or numbness.    ____________________________________________   PHYSICAL EXAM:  VITAL SIGNS: ED Triage Vitals  Enc Vitals Group     BP      Pulse      Resp      Temp      Temp src      SpO2      Weight      Height      Head Circumference  Peak Flow      Pain Score      Pain Loc      Pain Edu?      Excl. in [place]?     Constitutional: Alert and oriented. Well appearing and in no acute distress. Eyes: Conjunctivae are normal. Head: Atraumatic. Nose: No congestion/rhinnorhea. Mouth/Throat: Mucous membranes are moist. Neck: No stridor.  Cardiovascular: Normal rate, regular rhythm. Grossly normal heart sounds.  Good peripheral circulation. Respiratory: Normal respiratory effort.  No retractions. Lungs CTAB. Gastrointestinal: Soft and nontender except for some mild to moderate tenderness focally in the left lower quadrant without rebound or guarding. No distention. Musculoskeletal: No lower extremity tenderness nor edema. Neurologic:  Normal speech and language. No gross focal neurologic deficits are appreciated.  Skin:  Skin is warm, dry and intact. No rash noted. Psychiatric: Mood and affect are normal. Speech and behavior are normal.  ____________________________________________   LABS (all labs ordered are listed, but only abnormal results are displayed)  Labs Reviewed  CBC - Abnormal; Notable for the following components:      Result Value   WBC 10.8 (*)    RBC 3.84 (*)    MCV 101.8 (*)    All other  components within normal limits  COMPREHENSIVE METABOLIC PANEL - Abnormal; Notable for the following components:   Glucose, Bld 218 (*)    BUN 30 (*)    Creatinine, Ser 1.22 (*)    GFR calc non Af Amer 42 (*)    GFR calc Af Amer 48 (*)    All other components within normal limits  LIPASE, BLOOD - Abnormal; Notable for the following components:   Lipase 67 (*)    All other components within normal limits  URINALYSIS, COMPLETE (UACMP) WITH MICROSCOPIC - Abnormal; Notable for the following components:   Color, Urine STRAW (*)    APPearance CLEAR (*)    Glucose, UA 50 (*)    Leukocytes,Ua TRACE (*)    All other components within normal limits   ____________________________________________  EKG  Reviewed inter by me at 2200 Heart rate 89 QRS 80 QTc 440 Normal sinus rhythm, possible old inferior infarct.  No evidence acute ischemia ____________________________________________  RADIOLOGY  CT scan pending, Dr. Owens Shark to follow-up ____________________________________________   PROCEDURES  Procedure(s) performed: None  Procedures  Critical Care performed: No  ____________________________________________   INITIAL IMPRESSION / ASSESSMENT AND PLAN / ED COURSE  Pertinent labs & imaging results that were available during my care of the patient were reviewed by me and considered in my medical decision making (see chart for details).   Differential diagnosis includes but is not limited to, abdominal perforation, aortic dissection, cholecystitis, appendicitis, diverticulitis, colitis, esophagitis/gastritis, kidney stone, pyelonephritis, urinary tract infection, aortic aneurysm. All are considered in decision and treatment plan. Based upon the patient's presentation and risk factors, overall she has a very reassuring clinical exam without peritonitis or a consistent worsening pain.  However given her age complaint of left lower quadrant pain and the intensity for which it comes when  present we will work her up to include imaging urinalysis etc.  Seems very focal involving left lower quadrant.  By physical exam there is no evidence of hernias mass or overlying lesions in this area.   ----------------------------------------- 11:09 PM on 12/08/2019 -----------------------------------------  Ongoing care and disposition assigned to Dr. Owens Shark, follow-up on pending labs including urinalysis as well as imaging including CT abdomen pelvis.  If reassuring, patient remaining well I would anticipate likely be able  to be discharged if no concerning findings.      ____________________________________________   FINAL CLINICAL IMPRESSION(S) / ED DIAGNOSES  Final diagnoses:  Left lower quadrant abdominal pain        Note:  This document was prepared using Dragon voice recognition software and may include unintentional dictation errors       Delman Kitten, MD 12/08/19 2316

## 2019-12-08 NOTE — ED Triage Notes (Signed)
Pt presents to ED via [facility] from [facility] c/o LLQ abd pain intermittently. States no pain on arrival. Hx cholecystectomy and appendectomy.

## 2019-12-08 NOTE — ED Notes (Signed)
Pt ambulatory with light assistance to the bathroom at this time.

## 2019-12-09 DIAGNOSIS — K5732 Diverticulitis of large intestine without perforation or abscess without bleeding: Secondary | ICD-10-CM | POA: Diagnosis not present

## 2019-12-09 MED ORDER — AMOXICILLIN-POT CLAVULANATE 875-125 MG PO TABS: 1.0000 | ORAL_TABLET | Freq: Two times a day (BID) | ORAL | 0 refills | Status: AC

## 2019-12-09 MED ORDER — AMOXICILLIN-POT CLAVULANATE 875-125 MG PO TABS
1.0000 | ORAL_TABLET | Freq: Once | ORAL | Status: AC
  Administered 2019-12-09: 02:00:00 1 via ORAL
  Filled 2019-12-09: qty 1

## 2019-12-09 NOTE — ED Provider Notes (Signed)
I assumed care of the patient from Dr. Wyline Copas at 11:00 PM with recommendation of follow-up with CT scan which revealed evidence of sigmoid diverticulitis  CLINICAL DATA:  79 year old female with abdominal pain. Concern for diverticulitis.  EXAM: CT ABDOMEN AND PELVIS WITH CONTRAST  TECHNIQUE: Multidetector CT imaging of the abdomen and pelvis was performed using the standard protocol following bolus administration of intravenous contrast.  CONTRAST:  9mL OMNIPAQUE IOHEXOL 300 MG/ML  SOLN  COMPARISON:  CT abdomen pelvis dated 06/17/2018.  FINDINGS: Lower chest: The visualized lung bases are clear. There is coronary vascular calcification and calcification of the aortic annulus.  No intra-abdominal free air or free fluid.  Hepatobiliary: The liver is unremarkable. No intrahepatic biliary duct dilatation. Cholecystectomy. No retained calcified stone in the central CBD.  Pancreas: Unremarkable. No pancreatic ductal dilatation or surrounding inflammatory changes.  Spleen: Normal in size without focal abnormality.  Adrenals/Urinary Tract: The adrenal glands are unremarkable. There is no hydronephrosis on either side. There is symmetric enhancement and excretion of contrast by both kidneys. The visualized ureters and urinary bladder appear unremarkable.  Stomach/Bowel: There is severe sigmoid diverticulosis with active inflammatory changes. No drainable fluid collection/abscess or diverticular perforation. There is diffuse colonic diverticulosis. There is large amount of stool throughout the colon. There is no bowel obstruction. The appendix is normal.  Vascular/Lymphatic: Advanced aortoiliac atherosclerotic disease. The IVC is unremarkable. No portal venous gas. There is no adenopathy.  Reproductive: The uterus is grossly unremarkable. No adnexal masses.  Other: None  Musculoskeletal: Osteopenia with severe degenerative changes of the spine and scoliosis. No  acute osseous pathology.  IMPRESSION: 1. Severe colonic diverticulosis with acute sigmoid diverticulitis. No diverticular abscess or perforation. 2. Constipation. No bowel obstruction. Normal appendix. 3. Aortic Atherosclerosis (ICD10-I70.0).   Electronically Signed   By: Anner Crete M.D.   On: 12/08/2019 23:37   Patient be prescribed Augmentin for home.  Patient received her first dose of Augmentin in the emergency department.  I spoke with the patient and her daughter regarding warning signs that would warrant immediate return to the emergency department   Gregor Hams, MD 12/09/19 [phone]

## 2019-12-10 LAB — URINE CULTURE: Culture: 10000 — AB

## 2019-12-11 ENCOUNTER — Telehealth: Payer: Self-pay | Admitting: Family Medicine

## 2019-12-11 NOTE — Telephone Encounter (Signed)
I called the pharmacy and stated yes they could do that. Rylann Munford,cma

## 2019-12-11 NOTE — Telephone Encounter (Signed)
Laurie Harris from [facility] called and would like to know if they can change the notification for patient to if her blood sugars get to 450?

## 2019-12-22 DIAGNOSIS — E785 Hyperlipidemia, unspecified: Secondary | ICD-10-CM | POA: Diagnosis not present

## 2019-12-22 DIAGNOSIS — I2510 Atherosclerotic heart disease of native coronary artery without angina pectoris: Secondary | ICD-10-CM | POA: Diagnosis not present

## 2019-12-22 DIAGNOSIS — I10 Essential (primary) hypertension: Secondary | ICD-10-CM | POA: Diagnosis not present

## 2019-12-22 DIAGNOSIS — K5900 Constipation, unspecified: Secondary | ICD-10-CM | POA: Diagnosis not present

## 2019-12-28 ENCOUNTER — Ambulatory Visit: Payer: Medicare Other | Admitting: Family Medicine

## 2020-01-15 ENCOUNTER — Telehealth: Payer: Self-pay | Admitting: Family Medicine

## 2020-01-15 NOTE — Telephone Encounter (Signed)
Pt daughter called wanting to talk to Gae Bon about a letter Please contact pt daughter Jeani Hawking [phone]

## 2020-01-18 ENCOUNTER — Other Ambulatory Visit: Payer: Self-pay | Admitting: Family Medicine

## 2020-01-18 DIAGNOSIS — U071 COVID-19: Secondary | ICD-10-CM | POA: Diagnosis not present

## 2020-01-18 DIAGNOSIS — Z20828 Contact with and (suspected) exposure to other viral communicable diseases: Secondary | ICD-10-CM | POA: Diagnosis not present

## 2020-01-19 DIAGNOSIS — Z20828 Contact with and (suspected) exposure to other viral communicable diseases: Secondary | ICD-10-CM | POA: Diagnosis not present

## 2020-01-19 DIAGNOSIS — U071 COVID-19: Secondary | ICD-10-CM | POA: Diagnosis not present

## 2020-01-19 MED ORDER — PANTOPRAZOLE SODIUM 40 MG PO TBEC: 40.0000 mg | DELAYED_RELEASE_TABLET | ORAL | 2 refills | Status: DC

## 2020-01-19 NOTE — Telephone Encounter (Signed)
I would have her outline the difficulties that the patient is having currently and include any new symptoms or difficulties she is having.

## 2020-01-19 NOTE — Telephone Encounter (Signed)
Protonix sent to pharmacy to take every other day. I would need to know the reason that she was denied long term care.

## 2020-01-19 NOTE — Telephone Encounter (Signed)
The daughter stated that the reason was he memory loss was not severe enough, she passed the test from the provider and she has an assessment at a virtual visit with the long term care provider.   The daughter states she is beginning to repeat herself and she falls without her walker.  She stated she has to send a letter to appeal their decision. Jakyrie Totherow,cma

## 2020-01-19 NOTE — Telephone Encounter (Signed)
I called and spoke with the patient's daughter and she stated that the patient was denied long  term care and she just wanted advice on what to say in the appeal letter. Also the patient stated she is no longer taking the medication for heart burn but before she stopped she was taking it every other day and it was helping her but since she stopped all together the patient is experiencing more heartburn and want to see if the provider can revisit this and send in a RX for the patient to take the medication every other day. Adraine Biffle,cma

## 2020-01-19 NOTE — Addendum Note (Signed)
Addended by: Caryl Bis, Tyner Codner G on: 01/19/2020 01:02 PM   Modules accepted: Orders

## 2020-01-21 NOTE — Telephone Encounter (Signed)
I informed the patient daughter what to add to the letter for an appeal.  Laurie Harris,cma

## 2020-01-22 ENCOUNTER — Other Ambulatory Visit: Payer: Self-pay

## 2020-01-22 ENCOUNTER — Encounter: Payer: Self-pay | Admitting: Physician Assistant

## 2020-01-22 ENCOUNTER — Ambulatory Visit (INDEPENDENT_AMBULATORY_CARE_PROVIDER_SITE_OTHER): Payer: Medicare Other | Admitting: Physician Assistant

## 2020-01-22 VITALS — BP 140/60 | HR 73 | Ht 66.0 in | Wt 120.5 lb

## 2020-01-22 DIAGNOSIS — Z87898 Personal history of other specified conditions: Secondary | ICD-10-CM

## 2020-01-22 DIAGNOSIS — I10 Essential (primary) hypertension: Secondary | ICD-10-CM | POA: Diagnosis not present

## 2020-01-22 DIAGNOSIS — N189 Chronic kidney disease, unspecified: Secondary | ICD-10-CM | POA: Diagnosis not present

## 2020-01-22 DIAGNOSIS — Z8673 Personal history of transient ischemic attack (TIA), and cerebral infarction without residual deficits: Secondary | ICD-10-CM | POA: Diagnosis not present

## 2020-01-22 DIAGNOSIS — E1129 Type 2 diabetes mellitus with other diabetic kidney complication: Secondary | ICD-10-CM

## 2020-01-22 DIAGNOSIS — I779 Disorder of arteries and arterioles, unspecified: Secondary | ICD-10-CM

## 2020-01-22 DIAGNOSIS — Z794 Long term (current) use of insulin: Secondary | ICD-10-CM | POA: Diagnosis not present

## 2020-01-22 DIAGNOSIS — Z9181 History of falling: Secondary | ICD-10-CM | POA: Diagnosis not present

## 2020-01-22 DIAGNOSIS — E785 Hyperlipidemia, unspecified: Secondary | ICD-10-CM | POA: Diagnosis not present

## 2020-01-22 MED ORDER — PANTOPRAZOLE SODIUM 40 MG PO TBEC: 40.0000 mg | DELAYED_RELEASE_TABLET | ORAL | 2 refills | Status: DC

## 2020-01-22 NOTE — Progress Notes (Signed)
Office Visit    Patient Name: Laurie Harris Date of Encounter: 01/22/2020  Primary Care Provider:  [name], MD Primary Cardiologist:  No primary care provider on file.  Chief Complaint    Chief Complaint  Patient presents with  . OTHER    OD 3 month f/u no complaints today. Meds reviewed verbally with pt.    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  Past Medical History    Past Medical History:  Diagnosis Date  . Carotid arterial disease ([place])    a. 09/2016 Carotid U/S: R carotid bifurcation dzs of 50-69%, L carotid bifurcation dzs of <50%. Patent vertebral arteries w/ anegrade flow.  . Cerebellar hemorrhage ([place]) 03/16/2015   Left Cerebellar Hemorrhage- See Care Everywhere Seen by [facility]    . Chicken pox   . CKD (chronic kidney disease), stage III    functioning at 46%  . Diabetes mellitus without complication ([place])   . Hemorrhagic stroke ([place]) 12/31/2014   a. in the setting of warfarin therapy.  Marland Kitchen History of appendicitis 08/07/2016  . Hyperlipidemia   . Hypertension   . Syncope    a. 09/2016 Echo: EF 55-60%, no rwma, Gr1 DD.   Past Surgical History:  Procedure Laterality Date  . ABCESS DRAINAGE  06/14/2016   appendix  . APPENDECTOMY  06/13/2016  . CATARACT EXTRACTION W/PHACO Left 09/04/2017   Procedure: CATARACT EXTRACTION PHACO AND INTRAOCULAR LENS PLACEMENT ([place]) COMPLICATED LEFT DIABETIC;  Surgeon: Laurie Koyanagi, MD;  Location: [place];  Service: Ophthalmology;  Laterality: Left;  Diabetic - insulin  . CATARACT EXTRACTION W/PHACO Right 10/02/2017   Procedure: CATARACT  EXTRACTION PHACO AND INTRAOCULAR LENS PLACEMENT ([place]) COMPLICATED  RIGHT DIABETIC;  Surgeon: Laurie Koyanagi, MD;  Location: [place];  Service: Ophthalmology;  Laterality: Right;  Diabetic - insulin  . CHOLECYSTECTOMY N/A 06/17/2018   Procedure: LAPAROSCOPIC CHOLECYSTECTOMY;  Surgeon: Laurie Sprague, DO;  Location: [facility];  Service: General;  Laterality: N/A;  . COLONOSCOPY    . NECK SURGERY      Allergies  Allergies  Allergen Reactions  . Warfarin And Related     Possible stroke    History of Present Illness    Laurie Harris is a 79 y.o. female with PMH as above. She has a history of syncope attributed to vasovagal, dehydration, and hyperglycemia etiology in the pas (2016, 2018) t.  She has a history of remote Myoview ruled normal in 2014.  09/2016 echo showed normal EF, G1 DD.  09/2018 carotid ultrasound showed ICA with 40 to [id number] stenosis and  LICA  1 to [id number] stenosis, which was improved when compared with study from 1 year earlier.    She was seen in the ED 08/2019 in the setting of fall versus LOC.  She reported poor p.o. intake that day.  CT head was without acute abnormality.  High-sensitivity troponin negative.  BMET was consistent with prerenal state.  Symptoms were felt 2/2 dehydration.  Outpatient follow-up with PCP was advised.  She was seen again by [facility] cardiology 09/18/2019 after experiencing an unwitnessed syncopal episode while sitting on her couch.  She reported dizziness/lightheadedness and a funny feeling in  the head prior to this episode.  At that time, she was living with her daughter, who then found the patient on the floor and contacted EMS.  It was unclear how long she was without consciousness.  No reported symptoms of chest pain or shortness of breath prior to this episode.  Recommendations were for no driving x6 months and increased hydration.  Zio patch was noted as future consideration.  Per review of her most recent PCP documentation, as of 11/2019,  she continued to occasionally have CBGs over 100, as well as those much lower.  Her Humalog was decrease, which helped reduce the number of hypoglycemic episodes.  Most recent A1c 6.6 per her report.  In early 12/2019, she was admitted to [facility] ED with report of left lower quadrant abdominal pain.  CT scan showed diverticulosis with acute sigmoid diverticulitis.  No diverticular abscess or perforation was noted.  Also reported was constipation, as well as aortic atherosclerosis.  EKG was without acute findings.  She was prescribed Augmentin and discharged home with first dose of Augmentin received in the emergency department.  Today, she presents to clinic and reports that she is doing well.  She denies any further episodes of syncope.  She reports her last presyncopal episode as 1 day after her first COVID-19 vaccination and occurring on February 16.  She again experienced symptoms following her second dose of vaccination.  She denies any recent falls.  She also denies any racing heart rate, palpitations, presyncope, or dizziness.  She reports that she usually eats around 5 PM and then goes to sleep around 11 PM.  In the middle of the night, she awakes with chest discomfort that self resolves and then allows her to fall back to sleep.  In addition, she often wakes up to use the restroom.  She reports atypical chest discomfort when waking at night and located in the vague anterior chest and abdomen area.  She denies feeling this discomfort when she naps in her recliner chair during the day.  Given she normally sleeps laying flat without a pillow, we discussed possibly elevating her head to reduce GERD-like symptoms at night.  She reports improved symptoms from her 12/2019 ED presentation.  No signs or symptoms of GI bleed.  She does report some discomfort since weaning off of her previous Protonix due to the side effects associated with this medication.  She reports that she was weaned down to every other day on  the Protonix and then completely off the medication; however, since being off of the medication, she has noted return of her symptoms.  She called the office and is currently awaiting refill of the medication with planned restart of every other day Protonix.  She currently resides at [facility] and notes that they often check her blood pressure with readings much lower than today's BP.  She is joined by her daughter today, who wonders if the current elevated blood pressure may be due to white coat syndrome.  No SOB/DOE, abdominal distention, orthopnea, PND, early satiety, or weight gain.  She reports medication compliance.  Home Medications    Prior to Admission medications   Medication Sig Start Date End Date Taking? Authorizing Provider  aspirin EC 81 MG tablet Take 81 mg by mouth daily.    [provider]  benazepril (LOTENSIN) 5 MG tablet TAKE ONE TABLET EVERY DAY 05/12/19   Laurie Haven, MD  calcium carbonate (CALCIUM 600) 600 MG TABS tablet Take 1 tablet (600 mg  total) by mouth daily. 12/01/19   Laurie Haven, MD  cholecalciferol (VITAMIN D) 1000 units tablet Take 2,000 Units by mouth daily.    [provider]  docusate sodium (COLACE) 100 MG capsule Take 1 capsule (100 mg total) by mouth 2 (two) times daily as needed for mild constipation. 06/19/18   Lysle Pearl, Isami, DO  fenofibrate 160 MG tablet TAKE ONE TABLET BY MOUTH EVERY DAY 09/16/19   Laurie Haven, MD  Insulin Lispro Prot & Lispro (HUMALOG MIX 75/25 KWIKPEN) (75-25) 100 UNIT/ML Kwikpen INJECT 17 UNITS TWICE DAILY AS DIRECTED 01/18/20   Laurie Haven, MD  insulin lispro protamine-lispro (HUMALOG 75/25 MIX) (75-25) 100 UNIT/ML SUSP injection Inject into the skin. Inject 19 units in the morning and 18 at night    [provider]  Multiple Vitamin (MULTIVITAMIN WITH MINERALS) TABS tablet Take 1 tablet by mouth daily.    [provider]  NOVOFINE PLUS 32G X 4 MM MISC USE TWICE A DAY AS  DIRECTED 04/06/19   Laurie Haven, MD  Omega-3 Fatty Acids (FISH OIL) 1200 MG CAPS Take 1,200 mg by mouth daily.    [provider]  pantoprazole (PROTONIX) 40 MG tablet Take 1 tablet (40 mg total) by mouth every other day. 01/19/20   Laurie Haven, MD  polyethylene glycol powder (GLYCOLAX/MIRALAX) powder Take 17 g by mouth daily as needed for mild constipation. 07/29/18   Laurie Haven, MD  rosuvastatin (CRESTOR) 40 MG tablet TAKE ONE TABLET EVERY DAY 08/11/19   Laurie Haven, MD    Review of Systems    She denies chest pain not attributed to GERD, palpitations, dyspnea, pnd, orthopnea, n, v, presyncope, syncope, edema, weight gain, or early satiety. She reports improved GERD and diverticulitis sx.   All other systems reviewed and are otherwise negative except as noted above.  Physical Exam    VS:  BP 140/60 (BP Location: Left Arm, Patient Position: Sitting, Cuff Size: Normal)   Pulse 73   Ht 5\' 6"  (1.676 m)   Wt 120 lb 8 oz (54.7 kg)   SpO2 98%   BMI 19.45 kg/m  , BMI Body mass index is 19.45 kg/m. GEN: Well nourished, well developed, in no acute distress.Seated on edge of table with mask in place.  HEENT: normal. Neck: Supple, no JVD, carotid bruits, or masses. Cardiac: RRR, no murmurs, rubs, or gallops. No clubbing, cyanosis, edema.  Radials/DP/PT 2+ and equal bilaterally.  Respiratory:  Respirations regular and unlabored, clear to auscultation bilaterally. GI: Soft, nontender, nondistended, BS + x 4. MS: no deformity or atrophy. Skin: warm and dry, no rash. Neuro:  Strength and sensation are intact. Psych: Normal affect.  Accessory Clinical Findings    ECG personally reviewed by me today - NSR, 73bpm. LVH - no acute changes.  VITALS Reviewed today   Temp Readings from Last 3 Encounters:  12/08/19 98.6 F (37 C) (Oral)  11/25/19 (!) 96 F (35.6 C) (Temporal)  10/20/19 99 F (37.2 C) (Oral)   BP Readings from Last 3 Encounters:  01/22/20  140/60  12/09/19 (!) 168/84  11/25/19 123/70   Pulse Readings from Last 3 Encounters:  01/22/20 73  12/09/19 89  11/25/19 75    Wt Readings from Last 3 Encounters:  01/22/20 120 lb 8 oz (54.7 kg)  12/08/19 123 lb 12.8 oz (56.2 kg)  11/25/19 123 lb (55.8 kg)     LABS  reviewed today   CareEverwhere Labs present and most  recent? Yes/No: No  Lab Results  Component Value Date   WBC 10.8 (H) 12/08/2019   HGB 12.9 12/08/2019   HCT 39.1 12/08/2019   MCV 101.8 (H) 12/08/2019   PLT 188 12/08/2019   Lab Results  Component Value Date   CREATININE 1.22 (H) 12/08/2019   BUN 30 (H) 12/08/2019   NA 139 12/08/2019   K 4.1 12/08/2019   CL 109 12/08/2019   CO2 23 12/08/2019   Lab Results  Component Value Date   ALT 25 12/08/2019   AST 33 12/08/2019   ALKPHOS 78 12/08/2019   BILITOT 0.8 12/08/2019   Lab Results  Component Value Date   CHOL 122 11/05/2019   HDL 37 11/05/2019   LDLCALC 52 11/05/2019   LDLDIRECT 64.0 08/05/2019   TRIG 167 (A) 11/05/2019   CHOLHDL 3.3 09/19/2019    Lab Results  Component Value Date   HGBA1C 6.6 11/05/2019   Lab Results  Component Value Date   TSH 1.63 11/05/2019     STUDIES/PROCEDURES reviewed today    Monitor 10/16/19 Normal sinus rhythm avg HR of 74 bpm.  5 Supraventricular Tachycardia runs occurred, the run with the fastest interval lasting 4 beats with a max rate of 190 bpm, the longest lasting 6 beats with an avg rate of 127 bpm.  Isolated SVEs were rare (<1.0%), SVE Couplets were rare (<1.0%), and no SVE Triplets were present. Isolated VEs were rare (<1.0%), VE Couplets were rare (<1.0%), and no VE Triplets were present. Ventricular Bigeminy and Trigeminy were present. No patient triggered events reported Signed, Esmond Plants, MD, Ph.D  Echo 09/18/19 1. Left ventricular ejection fraction, by visual estimation, is 60 to  65%. The left ventricle has normal function. There is mildly increased  left ventricular hypertrophy.  2.  Left ventricular diastolic parameters are consistent with Grade I  diastolic dysfunction (impaired relaxation).  3. The left ventricle has no regional wall motion abnormalities.  4. Global right ventricle has normal systolic function.The right  ventricular size is normal. No increase in right ventricular wall  thickness.  5. Left atrial size was normal.  6. The aortic valve is normal in structure. Aortic valve regurgitation is  not visualized. Mild to moderate aortic valve sclerosis/calcification  without any evidence of aortic stenosis.  7. TR signal is inadequate for assessing pulmonary artery systolic  pressure.   US Carotid 09/18/19 IMPRESSION: Moderate bilateral carotid atherosclerosis. No hemodynamically significant ICA stenosis. Degree of narrowing less than 50% bilaterally by ultrasound criteria. Patent antegrade vertebral flow bilaterally  MR brain 09/17/19 IMPRESSION: 1. No acute intracranial abnormality. 2. Generalized age-related cerebral atrophy with mild-to-moderate chronic microvascular ischemic disease.  CT head 09/17/19 IMPRESSION: Atrophy, chronic microvascular disease. No acute intracranial abnormality.  Assessment & Plan    History of syncope -She has been admitted in the past [place] with previous labs revealing prerenal state and patient reporting poor oral intake. Suspect etiology likely 2/2 dehydration and low blood sugar as documented in the past. Denies recent episodes since last visit.  --US carotids showed mild bilateral atherosclerosis without hemodynamically significant ICA stenosis and narrowing less than 50%. No reported amaurosis fugax or bruit appreciated on exam. --CBC stable. No s/sx of bleeding reported. TSH wnl. Electrolytes stable.  --Echo with nl EF and without significant valvular or acute structural abnormality. --Zio with 5 very brief runs of SVT and isolated ectopy but otherwise NSR. Less likely 2/2 arrhythmia.  --Less likely due  to PE given stable SpO2 and HR without EKG  changes suggestive of PE and echo without signs of R heart strain. --Orthostatics negative and not hypotensive. As below, will obtain pressures from [facility] as well for further information.  --MR brain and CT head as above and without acute findings (known previous hemorrhagic CVA as in HPI). --Low concern for cardiac ischemia / ACS given previous hospital workup. In the past, HS Tn negative. EKGs today and in past without acute changes. No CP or DOE preceding episodes. 2018 MPI low risk. Reviewed current Lexiscan protocol in detail as could recheck MPI in the future if ongoing sx. Per daughter and patient, will defer for now and given reassuring workup as above and no recent episodes.  --No current plans for further workup. Will review [place] BP (daughter will obtain for [name]) and provide further recommendations if indicated at that time.  --Continue current medications.   Hypertension --BP today above goal of 130/80 with patient reporting likely 2/2 white coat HTN. Given elevated BP, will need to monitor closely at RTC.  --Daughter will obtain [place] BP for [name] for further BP data.  --Discussed hesitance to adjust antihypertensives today given patients history of poor oral intake and prerenal AKI and based on one elevated pressure. Cautioned that will need to adjust mediations if BP consistently elevated with patient and daughter understanding. Discussed diet and low salt recommendations. --No medication changes. Continue ACE.  CKD --BP and glycemic control recommended to prevent worsening renal dz. Also recommend avoid prerenal AKI with poor oral intake. Most recent renal function shows Cr stable from previous and K stable.  Closely monitor.   HLD --Continue statin and fenofibrate. Recent LDL 11/2019 was 52 and at goal <70 with total cholesterol 122. Recent AST/ALT stable.  Carotid artery dz --Continue ASA and statin as above.    DM2 --Ongoing monitoring recommended. She has documented history of both high and low sugars, suspected to influence her previous presyncope and syncope.   GERD --Reached out to PCP regarding Protonix and this has been refilled for patient. No s/sx of bleeding.   Diverticulosis/ Diverticulitis --Reports improved sx from 12/2019. As above, no s/sx of bleeding.   Medication changes: None Labs ordered: None Studies / Imaging ordered: None Disposition: RTC in 6-12 months   Arvil Chaco, PA-C 01/22/2020

## 2020-01-22 NOTE — Addendum Note (Signed)
Addended by: [name] on: 01/22/2020 04:30 PM   Modules accepted: Orders

## 2020-01-22 NOTE — Patient Instructions (Addendum)
Make sure to check Blood Pressures 2 hours after your medications.  Please keep a log of your readings so we can see how they trend.  Avoid these things for 30 minutes before checking your blood pressure:  Drinking caffeine.  Drinking alcohol.  Eating.  Smoking.  Exercising. Five minutes before checking your blood pressure:  Pee.  Sit in a dining chair. Avoid sitting in a soft couch or armchair.  Be quiet. Do not talk.     Medication Instructions:  No changes  *If you need a refill on your cardiac medications before your next appointment, please call your pharmacy*   Lab Work: None   If you have labs (blood work) drawn today and your tests are completely normal, you will receive your results only by: Marland Kitchen MyChart Message (if you have MyChart) OR . A paper copy in the mail If you have any lab test that is abnormal or we need to change your treatment, we will call you to review the results.   Testing/Procedures: None   Follow-Up: At [facility], you and your health needs are our priority.  As part of our continuing mission to provide you with exceptional heart care, we have created designated Provider Care Teams.  These Care Teams include your primary Cardiologist (physician) and Advanced Practice Providers (APPs -  Physician Assistants and Nurse Practitioners) who all work together to provide you with the care you need, when you need it.  Your next appointment:   6 month(s)  The format for your next appointment:   In Person  Provider:    You may see your provider or one of the following Advanced Practice Providers on your designated Care Team:    Murray Hodgkins, NP  Christell Faith, PA-C  Marrianne Mood, PA-C   How to Take Your Blood Pressure You can take your blood pressure at home with a machine. You may need to check your blood pressure at home:  To check if you have high blood pressure (hypertension).  To check your blood pressure over  time.  To make sure your blood pressure medicine is working. Supplies needed: You will need a blood pressure machine, or monitor. You can buy one at a drugstore or online. When choosing one:  Choose one with an arm cuff.  Choose one that wraps around your upper arm. Only one finger should fit between your arm and the cuff.  Do not choose one that measures your blood pressure from your wrist or finger. Your doctor can suggest a monitor. How to prepare Avoid these things for 30 minutes before checking your blood pressure:  Drinking caffeine.  Drinking alcohol.  Eating.  Smoking.  Exercising. Five minutes before checking your blood pressure:  Pee.  Sit in a dining chair. Avoid sitting in a soft couch or armchair.  Be quiet. Do not talk. How to take your blood pressure Follow the instructions that came with your machine. If you have a digital blood pressure monitor, these may be the instructions: 1. Sit up straight. 2. Place your feet on the floor. Do not cross your ankles or legs. 3. Rest your left arm at the level of your heart. You may rest it on a table, desk, or chair. 4. Pull up your shirt sleeve. 5. Wrap the blood pressure cuff around the upper part of your left arm. The cuff should be 1 inch (2.5 cm) above your elbow. It is best to wrap the cuff around bare skin. 6. Fit the cuff  snugly around your arm. You should be able to place only one finger between the cuff and your arm. 7. Put the cord inside the groove of your elbow. 8. Press the power button. 9. Sit quietly while the cuff fills with air and loses air. 10. Write down the numbers on the screen. 11. Wait 2-3 minutes and then repeat steps 1-10. What do the numbers mean? Two numbers make up your blood pressure. The first number is called systolic pressure. The second is called diastolic pressure. An example of a blood pressure reading is "120 over 80" (or 120/80). If you are an adult and do not have a medical  condition, use this guide to find out if your blood pressure is normal: Normal  First number: below 120.  Second number: below 80. Elevated  First number: 120-129.  Second number: below 80. Hypertension stage 1  First number: 130-139.  Second number: 80-89. Hypertension stage 2  First number: 140 or above.  Second number: 1 or above. Your blood pressure is above normal even if only the top or bottom number is above normal. Follow these instructions at home:  Check your blood pressure as often as your doctor tells you to.  Take your monitor to your next doctor's appointment. Your doctor will: ? Make sure you are using it correctly. ? Make sure it is working right.  Make sure you understand what your blood pressure numbers should be.  Tell your doctor if your medicines are causing side effects. Contact a doctor if:  Your blood pressure keeps being high. Get help right away if:  Your first blood pressure number is higher than 180.  Your second blood pressure number is higher than 120. This information is not intended to replace advice given to you by your health care provider. Make sure you discuss any questions you have with your health care provider. Document Revised: 08/02/2017 Document Reviewed: 01/27/2016 Elsevier Patient Education  2020 [employer].

## 2020-01-25 ENCOUNTER — Telehealth: Payer: Self-pay | Admitting: Physician Assistant

## 2020-01-25 NOTE — Telephone Encounter (Signed)
Fax received from [facility] with the following blood pressures per provider request:  133/73 116/74 104/84 144/87 138/80 111/64 134/75 63/43 121/60 145/75 141/85 139/78 137/69 122/72 130/73 99/66 91/63  134/74 122/70 123/76 110/63  Showed these to our APP and they wanted sent in note to them and PCP. Will route this to them.

## 2020-02-03 ENCOUNTER — Other Ambulatory Visit: Payer: Self-pay

## 2020-02-03 DIAGNOSIS — E782 Mixed hyperlipidemia: Secondary | ICD-10-CM

## 2020-02-03 MED ORDER — FENOFIBRATE 160 MG PO TABS: 160.0000 mg | ORAL_TABLET | Freq: Every day | ORAL | 1 refills | Status: DC

## 2020-02-22 ENCOUNTER — Other Ambulatory Visit: Payer: Self-pay | Admitting: Family Medicine

## 2020-02-26 ENCOUNTER — Encounter: Payer: Self-pay | Admitting: Family Medicine

## 2020-02-26 ENCOUNTER — Ambulatory Visit: Payer: Medicare Other | Admitting: Family Medicine

## 2020-03-04 ENCOUNTER — Other Ambulatory Visit: Payer: Self-pay

## 2020-03-04 ENCOUNTER — Ambulatory Visit (INDEPENDENT_AMBULATORY_CARE_PROVIDER_SITE_OTHER): Payer: Medicare Other | Admitting: Family Medicine

## 2020-03-04 ENCOUNTER — Encounter: Payer: Self-pay | Admitting: Family Medicine

## 2020-03-04 DIAGNOSIS — Z794 Long term (current) use of insulin: Secondary | ICD-10-CM | POA: Diagnosis not present

## 2020-03-04 DIAGNOSIS — I10 Essential (primary) hypertension: Secondary | ICD-10-CM

## 2020-03-04 DIAGNOSIS — E1129 Type 2 diabetes mellitus with other diabetic kidney complication: Secondary | ICD-10-CM | POA: Diagnosis not present

## 2020-03-04 DIAGNOSIS — R413 Other amnesia: Secondary | ICD-10-CM

## 2020-03-04 LAB — POCT GLYCOSYLATED HEMOGLOBIN (HGB A1C): Hemoglobin A1C: 5.9 % — AB (ref 4.0–5.6)

## 2020-03-04 NOTE — Patient Instructions (Signed)
Nice to see you. Please try to cut down on some of the foods that are increasing your blood sugars. Please monitor your memory and if it worsens let us know.

## 2020-03-04 NOTE — Assessment & Plan Note (Signed)
Stable.  Notes no ADL issues.  We will monitor.

## 2020-03-04 NOTE — Progress Notes (Signed)
  Tommi Rumps, MD Phone: [phone]  Laurie Harris is a 79 y.o. female who presents today for f/u.  DIABETES Disease Monitoring: Blood Sugar ranges-wide ranging, form will be scanned Polyuria/phagia/dipsia- no      Optho- UTD Medications: Compliance- taking humalog 75/25 16 u BID  Hypoglycemic symptoms- occasional, eats and improves  HYPERTENSION  Disease Monitoring  Home BP Monitoring generally well controlled, form will be scanned Chest pain- no    Dyspnea- no Medications  Compliance-  Taking benazepril.  Edema- no  Memory difficulty: Patient does note some memory issues with remembering people's names.  She is able to bathe herself, dress herself, toilet herself, and feed herself.  She gets some help with putting sheets on the bed.  She does sudoku to help stay mentally stimulated.     Social History   Tobacco Use  Smoking Status Never Smoker  Smokeless Tobacco Never Used     ROS see history of present illness  Objective  Physical Exam Vitals:   03/04/20 1506  BP: 130/70  Pulse: (!) 56  Temp: 97.6 F (36.4 C)  SpO2: 99%    BP Readings from Last 3 Encounters:  03/04/20 130/70  01/22/20 140/60  12/09/19 (!) 168/84   Wt Readings from Last 3 Encounters:  03/04/20 120 lb (54.4 kg)  01/22/20 120 lb 8 oz (54.7 kg)  12/08/19 123 lb 12.8 oz (56.2 kg)    Physical Exam Constitutional:      General: She is not in acute distress.    Appearance: She is not diaphoretic.  Cardiovascular:     Rate and Rhythm: Normal rate and regular rhythm.     Heart sounds: Normal heart sounds.  Pulmonary:     Effort: Pulmonary effort is normal.     Breath sounds: Normal breath sounds.  Musculoskeletal:     Right lower leg: No edema.     Left lower leg: No edema.  Skin:    General: Skin is warm and dry.  Neurological:     Mental Status: She is alert.      Assessment/Plan: Please see individual problem list.  Benign essential HTN Decently well controlled.   Continue current medication.  Type II diabetes mellitus with renal manifestations (HCC) Check A1c.  Continue current medication.  Memory difficulty Stable.  Notes no ADL issues.  We will monitor.   Orders Placed This Encounter  Procedures  . POCT HgB A1C    No orders of the defined types were placed in this encounter.   This visit occurred during the SARS-CoV-2 public health emergency.  Safety protocols were in place, including screening questions prior to the visit, additional usage of staff PPE, and extensive cleaning of exam room while observing appropriate contact time as indicated for disinfecting solutions.    Tommi Rumps, MD [place]

## 2020-03-04 NOTE — Assessment & Plan Note (Signed)
Decently well controlled.  Continue current medication.

## 2020-03-04 NOTE — Assessment & Plan Note (Signed)
Check A1c.  Continue current medication.

## 2020-03-29 ENCOUNTER — Other Ambulatory Visit: Payer: Self-pay

## 2020-04-08 DIAGNOSIS — M79675 Pain in left toe(s): Secondary | ICD-10-CM | POA: Diagnosis not present

## 2020-04-08 DIAGNOSIS — E1069 Type 1 diabetes mellitus with other specified complication: Secondary | ICD-10-CM | POA: Diagnosis not present

## 2020-04-08 DIAGNOSIS — B351 Tinea unguium: Secondary | ICD-10-CM | POA: Diagnosis not present

## 2020-04-19 ENCOUNTER — Other Ambulatory Visit: Payer: Self-pay | Admitting: Family Medicine

## 2020-04-21 ENCOUNTER — Other Ambulatory Visit: Payer: Self-pay | Admitting: Family Medicine

## 2020-04-25 ENCOUNTER — Other Ambulatory Visit: Payer: Self-pay | Admitting: Family Medicine

## 2020-04-29 ENCOUNTER — Telehealth: Payer: Self-pay | Admitting: Family Medicine

## 2020-04-29 MED ORDER — TRIAMCINOLONE ACETONIDE 0.1 % EX CREA: 1.0000 "application " | TOPICAL_CREAM | Freq: Two times a day (BID) | CUTANEOUS | 0 refills | Status: DC

## 2020-04-29 NOTE — Telephone Encounter (Signed)
Patient is unsure of the rash. Looks kind of like eczema and it spreads. Currently on both legs. Patient was informed that if medication does not work for the rash she is to be evaluated. She stated she would comply.

## 2020-04-29 NOTE — Telephone Encounter (Signed)
patient 's nurse call in requesting Triamcinolone cream for rash she has patient has used this before for rash

## 2020-04-29 NOTE — Telephone Encounter (Signed)
[facility] called they need an order sent over for the triamcinolone cream (KENALOG) 0.1 %  Saying resident may keep by bedside  Fax # [phone]

## 2020-04-29 NOTE — Telephone Encounter (Signed)
Order was faxed to [facility] and confirmation received.  Darletta Noblett,cma

## 2020-04-29 NOTE — Telephone Encounter (Signed)
Triamcinolone sent to the pharmacy.  Please find out what kind of rash this is.  She will likely need to be seen if it does not resolve with the triamcinolone.

## 2020-06-22 DIAGNOSIS — Z23 Encounter for immunization: Secondary | ICD-10-CM | POA: Diagnosis not present

## 2020-07-05 ENCOUNTER — Encounter: Payer: Self-pay | Admitting: Family Medicine

## 2020-07-05 ENCOUNTER — Ambulatory Visit (INDEPENDENT_AMBULATORY_CARE_PROVIDER_SITE_OTHER): Payer: Medicare Other | Admitting: Family Medicine

## 2020-07-05 ENCOUNTER — Other Ambulatory Visit: Payer: Self-pay

## 2020-07-05 DIAGNOSIS — E1129 Type 2 diabetes mellitus with other diabetic kidney complication: Secondary | ICD-10-CM

## 2020-07-05 DIAGNOSIS — I708 Atherosclerosis of other arteries: Secondary | ICD-10-CM

## 2020-07-05 DIAGNOSIS — R21 Rash and other nonspecific skin eruption: Secondary | ICD-10-CM | POA: Diagnosis not present

## 2020-07-05 DIAGNOSIS — Z794 Long term (current) use of insulin: Secondary | ICD-10-CM

## 2020-07-05 DIAGNOSIS — F320 Major depressive disorder, single episode, mild: Secondary | ICD-10-CM | POA: Insufficient documentation

## 2020-07-05 DIAGNOSIS — I700 Atherosclerosis of aorta: Secondary | ICD-10-CM | POA: Diagnosis not present

## 2020-07-05 DIAGNOSIS — I10 Essential (primary) hypertension: Secondary | ICD-10-CM | POA: Diagnosis not present

## 2020-07-05 LAB — BASIC METABOLIC PANEL
BUN: 34 mg/dL — ABNORMAL HIGH (ref 6–23)
CO2: 25 mEq/L (ref 19–32)
Calcium: 10.1 mg/dL (ref 8.4–10.5)
Chloride: 103 mEq/L (ref 96–112)
Creatinine, Ser: 1.42 mg/dL — ABNORMAL HIGH (ref 0.40–1.20)
GFR: 34.64 mL/min — ABNORMAL LOW (ref >60.00)
Glucose, Bld: 219 mg/dL — ABNORMAL HIGH (ref 70–99)
Potassium: 4.7 mEq/L (ref 3.5–5.1)
Sodium: 136 mEq/L (ref 135–145)

## 2020-07-05 LAB — HEMOGLOBIN A1C: Hgb A1c MFr Bld: 6.6 % — ABNORMAL HIGH (ref 4.6–6.5)

## 2020-07-05 MED ORDER — KETOCONAZOLE 2 % EX CREA: 1.0000 "application " | TOPICAL_CREAM | Freq: Every day | CUTANEOUS | 0 refills | Status: DC

## 2020-07-05 NOTE — Patient Instructions (Signed)
Nice to see you. We will get labs today. We will try ketoconazole cream for your rash.

## 2020-07-05 NOTE — Assessment & Plan Note (Signed)
Trial of ketoconazole cream.

## 2020-07-05 NOTE — Assessment & Plan Note (Signed)
Check A1c.  Continue Humalog 75/25 16 units twice daily. 

## 2020-07-05 NOTE — Assessment & Plan Note (Signed)
She declines any treatment for this.  She will monitor.  Discussed that they could look for an alternative living facility.

## 2020-07-05 NOTE — Progress Notes (Signed)
Tommi Rumps, MD Phone: [phone]  Laurie Harris is a 79 y.o. female who presents today for f/u.  HYPERTENSION  Disease Monitoring  Home BP Monitoring notes it is up and down at her facility Chest pain- no    Dyspnea- no Medications  Compliance-  Taking benazepril 5 mg daily.  Edema- no No claudication.  DIABETES Disease Monitoring: Blood Sugar ranges-they are checking at her facility though she does not know the numbers Polyuria/phagia/dipsia- no      Optho- UTD Medications: Compliance- taking humalog 75/25 16 u BID  Hypoglycemic symptoms- rare, though only in the 70s when it is checked. Drinks OJ.   Depression: Patient notes some mild depression because of where she is living currently.  She is at a facility and she does not like it as much as when she was at home.  No SI.  Rash: Patient notes scaly red rash on her bilateral lower extremities right greater than left.  She used topical treatment previously which did help with itching though did not resolve the scaliness or redness.   Social History   Tobacco Use  Smoking Status Never Smoker  Smokeless Tobacco Never Used     ROS see history of present illness  Objective  Physical Exam Vitals:   07/05/20 0944  BP: (!) 150/80  Pulse: 91  Temp: 97.7 F (36.5 C)  SpO2: 99%    BP Readings from Last 3 Encounters:  07/05/20 (!) 150/80  03/04/20 130/70  01/22/20 140/60   Wt Readings from Last 3 Encounters:  07/05/20 121 lb 3.2 oz (55 kg)  03/04/20 120 lb (54.4 kg)  01/22/20 120 lb 8 oz (54.7 kg)    Physical Exam Constitutional:      General: She is not in acute distress.    Appearance: She is not diaphoretic.  Cardiovascular:     Rate and Rhythm: Normal rate and regular rhythm.     Heart sounds: Normal heart sounds.  Pulmonary:     Effort: Pulmonary effort is normal.     Breath sounds: Normal breath sounds.  Skin:    General: Skin is warm and dry.       Neurological:     Mental Status: She is  alert.    Diabetic Foot Exam - Simple   Simple Foot Form Diabetic Foot exam was performed with the following findings: Yes 07/05/2020 10:05 AM  Visual Inspection See comments: Yes Sensation Testing Intact to touch and monofilament testing bilaterally: Yes Pulse Check Posterior Tibialis and Dorsalis pulse intact bilaterally: Yes Comments Hallux valgus bilateral, left foot with second toe overriding great toe, no other deformities, ulcerations, or skin breakdown.       Assessment/Plan: Please see individual problem list.  Problem List Items Addressed This Visit    Aorto-iliac atherosclerosis ([place])    Denies claudication.  Continue risk factor management.      Benign essential HTN    Previously well controlled.  Unknown BPs at home.  We will request BP record.  She will continue benazepril 5 mg once daily.  Check BMP.      Relevant Orders   Basic Metabolic Panel (BMET)   Depression, major, single episode, mild ([place])    She declines any treatment for this.  She will monitor.  Discussed that they could look for an alternative living facility.      Rash    Trial of ketoconazole cream.      Relevant Medications   ketoconazole (NIZORAL) 2 % cream  Type II diabetes mellitus with renal manifestations (HCC)    Check A1c.  Continue Humalog 75/25 16 units twice daily.      Relevant Orders   HgB A1c      This visit occurred during the SARS-CoV-2 public health emergency.  Safety protocols were in place, including screening questions prior to the visit, additional usage of staff PPE, and extensive cleaning of exam room while observing appropriate contact time as indicated for disinfecting solutions.    Tommi Rumps, MD [place]

## 2020-07-05 NOTE — Assessment & Plan Note (Signed)
Previously well controlled.  Unknown BPs at home.  We will request BP record.  She will continue benazepril 5 mg once daily.  Check BMP.

## 2020-07-05 NOTE — Assessment & Plan Note (Signed)
Denies claudication.  Continue risk factor management.

## 2020-07-06 ENCOUNTER — Telehealth: Payer: Self-pay

## 2020-07-06 DIAGNOSIS — E1069 Type 1 diabetes mellitus with other specified complication: Secondary | ICD-10-CM | POA: Diagnosis not present

## 2020-07-06 DIAGNOSIS — B351 Tinea unguium: Secondary | ICD-10-CM | POA: Diagnosis not present

## 2020-07-06 DIAGNOSIS — M79675 Pain in left toe(s): Secondary | ICD-10-CM | POA: Diagnosis not present

## 2020-07-06 NOTE — Telephone Encounter (Signed)
Please let the patient know that her blood pressures are generally well controlled at the facility.  Her sugar is pretty variable though her A1c is well controlled.  She will continue with her current regimen of insulin.

## 2020-07-06 NOTE — Telephone Encounter (Signed)
I called and spoke with the patient and informed her that the provider stated her BS and BP is well controlled and to keep the current regimen of insulin, she understood.  Infantof Villagomez,cma

## 2020-07-07 ENCOUNTER — Other Ambulatory Visit: Payer: Self-pay

## 2020-07-07 ENCOUNTER — Telehealth: Payer: Self-pay | Admitting: Family Medicine

## 2020-07-07 DIAGNOSIS — I10 Essential (primary) hypertension: Secondary | ICD-10-CM

## 2020-07-07 NOTE — Telephone Encounter (Signed)
Pt daughter Called wanting to get her lab results

## 2020-07-08 NOTE — Telephone Encounter (Signed)
I called and spoke with the daughter and explained the lab results and why the patient was getting a recheck becaseu she was slightly dehydrated and she understood.  Ashiya Kinkead,cma

## 2020-07-08 NOTE — Progress Notes (Signed)
BMET ordered.  ?

## 2020-07-12 ENCOUNTER — Ambulatory Visit (INDEPENDENT_AMBULATORY_CARE_PROVIDER_SITE_OTHER): Payer: Medicare Other

## 2020-07-12 VITALS — Ht 66.0 in | Wt 121.0 lb

## 2020-07-12 DIAGNOSIS — Z0000 Encounter for general adult medical examination without abnormal findings: Secondary | ICD-10-CM

## 2020-07-12 NOTE — Patient Instructions (Addendum)
[name] , Thank you for taking time to come for your Medicare Wellness Visit. I appreciate your ongoing commitment to your health goals. Please review the following plan we discussed and let me know if I can assist you in the future.   These are the goals we discussed: Goals    . Follow up with Primary Care Provider     As needed       This is a list of the screening recommended for you and due dates:  Health Maintenance  Topic Date Due  . Pneumonia vaccines (1 of 2 - PCV13) Never done  . Eye exam for diabetics  07/09/2020  . Colon Cancer Screening  07/12/2021*  . Hemoglobin A1C  01/02/2021  . Complete foot exam   07/05/2021  . Tetanus Vaccine  08/22/2029  . DEXA scan (bone density measurement)  Completed  . COVID-19 Vaccine  Completed  *Topic was postponed. The date shown is not the original due date.    Immunizations Immunization History  Administered Date(s) Administered  . PFIZER SARS-COV-2 Vaccination 09/28/2019, 10/19/2019, 06/22/2020  . PPD Test 10/14/2019  . Tdap 08/23/2019   Keep all routine maintenance appointments.   Next scheduled lab 07/14/20 @ 9:30  Follow up 11/02/20 @ 9:30  Advanced directives: End of life planning; Advance aging; Advanced directives discussed.  Copy of current HCPOA/Living Will requested.    Conditions/risks identified: none new.   Follow up in one year for your annual wellness visit.   Preventive Care 44 Years and Older, Female Preventive care refers to lifestyle choices and visits with your health care provider that can promote health and wellness. What does preventive care include?  A yearly physical exam. This is also called an annual well check.  Dental exams once or twice a year.  Routine eye exams. Ask your health care provider how often you should have your eyes checked.  Personal lifestyle choices, including:  Daily care of your teeth and gums.  Regular physical activity.  Eating a healthy diet.  Avoiding  tobacco and drug use.  Limiting alcohol use.  Practicing safe sex.  Taking low-dose aspirin every day.  Taking vitamin and mineral supplements as recommended by your health care provider. What happens during an annual well check? The services and screenings done by your health care provider during your annual well check will depend on your age, overall health, lifestyle risk factors, and family history of disease. Counseling  Your health care provider may ask you questions about your:  Alcohol use.  Tobacco use.  Drug use.  Emotional well-being.  Home and relationship well-being.  Sexual activity.  Eating habits.  History of falls.  Memory and ability to understand (cognition).  Work and work Statistician.  Reproductive health. Screening  You may have the following tests or measurements:  Height, weight, and BMI.  Blood pressure.  Lipid and cholesterol levels. These may be checked every 5 years, or more frequently if you are over 73 years old.  Skin check.  Lung cancer screening. You may have this screening every year starting at age 32 if you have a 30-pack-year history of smoking and currently smoke or have quit within the past 15 years.  Fecal occult blood test (FOBT) of the stool. You may have this test every year starting at age 62.  Flexible sigmoidoscopy or colonoscopy. You may have a sigmoidoscopy every 5 years or a colonoscopy every 10 years starting at age 103.  Hepatitis C blood test.  Hepatitis B  blood test.  Sexually transmitted disease (STD) testing.  Diabetes screening. This is done by checking your blood sugar (glucose) after you have not eaten for a while (fasting). You may have this done every 1-3 years.  Bone density scan. This is done to screen for osteoporosis. You may have this done starting at age 41.  Mammogram. This may be done every 1-2 years. Talk to your health care provider about how often you should have regular  mammograms. Talk with your health care provider about your test results, treatment options, and if necessary, the need for more tests. Vaccines  Your health care provider may recommend certain vaccines, such as:  Influenza vaccine. This is recommended every year.  Tetanus, diphtheria, and acellular pertussis (Tdap, Td) vaccine. You may need a Td booster every 10 years.  Zoster vaccine. You may need this after age 48.  Pneumococcal 13-valent conjugate (PCV13) vaccine. One dose is recommended after age 14.  Pneumococcal polysaccharide (PPSV23) vaccine. One dose is recommended after age 31. Talk to your health care provider about which screenings and vaccines you need and how often you need them. This information is not intended to replace advice given to you by your health care provider. Make sure you discuss any questions you have with your health care provider. Document Released: 09/16/2015 Document Revised: 05/09/2016 Document Reviewed: 06/21/2015 Elsevier Interactive Patient Education  2017 [employer] Prevention in the Home Falls can cause injuries. They can happen to people of all ages. There are many things you can do to make your home safe and to help prevent falls. What can I do on the outside of my home?  Regularly fix the edges of walkways and driveways and fix any cracks.  Remove anything that might make you trip as you walk through a door, such as a raised step or threshold.  Trim any bushes or trees on the path to your home.  Use bright outdoor lighting.  Clear any walking paths of anything that might make someone trip, such as rocks or tools.  Regularly check to see if handrails are loose or broken. Make sure that both sides of any steps have handrails.  Any raised decks and porches should have guardrails on the edges.  Have any leaves, snow, or ice cleared regularly.  Use sand or salt on walking paths during winter.  Clean up any spills in your garage  right away. This includes oil or grease spills. What can I do in the bathroom?  Use night lights.  Install grab bars by the toilet and in the tub and shower. Do not use towel bars as grab bars.  Use non-skid mats or decals in the tub or shower.  If you need to sit down in the shower, use a plastic, non-slip stool.  Keep the floor dry. Clean up any water that spills on the floor as soon as it happens.  Remove soap buildup in the tub or shower regularly.  Attach bath mats securely with double-sided non-slip rug tape.  Do not have throw rugs and other things on the floor that can make you trip. What can I do in the bedroom?  Use night lights.  Make sure that you have a light by your bed that is easy to reach.  Do not use any sheets or blankets that are too big for your bed. They should not hang down onto the floor.  Have a firm chair that has side arms. You can use this for support  while you get dressed.  Do not have throw rugs and other things on the floor that can make you trip. What can I do in the kitchen?  Clean up any spills right away.  Avoid walking on wet floors.  Keep items that you use a lot in easy-to-reach places.  If you need to reach something above you, use a strong step stool that has a grab bar.  Keep electrical cords out of the way.  Do not use floor polish or wax that makes floors slippery. If you must use wax, use non-skid floor wax.  Do not have throw rugs and other things on the floor that can make you trip. What can I do with my stairs?  Do not leave any items on the stairs.  Make sure that there are handrails on both sides of the stairs and use them. Fix handrails that are broken or loose. Make sure that handrails are as long as the stairways.  Check any carpeting to make sure that it is firmly attached to the stairs. Fix any carpet that is loose or worn.  Avoid having throw rugs at the top or bottom of the stairs. If you do have throw rugs,  attach them to the floor with carpet tape.  Make sure that you have a light switch at the top of the stairs and the bottom of the stairs. If you do not have them, ask someone to add them for you. What else can I do to help prevent falls?  Wear shoes that:  Do not have high heels.  Have rubber bottoms.  Are comfortable and fit you well.  Are closed at the toe. Do not wear sandals.  If you use a stepladder:  Make sure that it is fully opened. Do not climb a closed stepladder.  Make sure that both sides of the stepladder are locked into place.  Ask someone to hold it for you, if possible.  Clearly mark and make sure that you can see:  Any grab bars or handrails.  First and last steps.  Where the edge of each step is.  Use tools that help you move around (mobility aids) if they are needed. These include:  Canes.  Walkers.  Scooters.  Crutches.  Turn on the lights when you go into a dark area. Replace any light bulbs as soon as they burn out.  Set up your furniture so you have a clear path. Avoid moving your furniture around.  If any of your floors are uneven, fix them.  If there are any pets around you, be aware of where they are.  Review your medicines with your doctor. Some medicines can make you feel dizzy. This can increase your chance of falling. Ask your doctor what other things that you can do to help prevent falls. This information is not intended to replace advice given to you by your health care provider. Make sure you discuss any questions you have with your health care provider. Document Released: 06/16/2009 Document Revised: 01/26/2016 Document Reviewed: 09/24/2014 Elsevier Interactive Patient Education  2017 [employer].

## 2020-07-12 NOTE — Progress Notes (Addendum)
Subjective:   Laurie Harris is a 78 y.o. female who presents for Medicare Annual (Subsequent) preventive examination.  Review of Systems    No ROS.  Medicare Wellness Virtual Visit.     Cardiac Risk Factors include: advanced age (>35men, >23 women);diabetes mellitus     Objective:    Today's Vitals   07/12/20 0934  Weight: 121 lb (54.9 kg)  Height: 5\' 6"  (1.676 m)   Body mass index is 19.53 kg/m.  Advanced Directives 07/12/2020 12/08/2019 10/20/2019 09/25/2019 09/18/2019 09/17/2019 08/23/2019  Does Patient Have a Medical Advance Directive? Yes No Yes Yes Yes Yes No  Type of Advance Directive Living will - Living will [place];Living will Living will [place];Living will -  Does patient want to make changes to medical advance directive? No - Patient declined - - No - Guardian declined No - Patient declined - -  Copy of [place] in Chart? - - - - - - -  Would patient like information on creating a medical advance directive? - No - Patient declined No - Patient declined - - - No - Patient declined    Current Medications (verified) Outpatient Encounter Medications as of 07/12/2020  Medication Sig  . aspirin EC 81 MG tablet Take 81 mg by mouth daily.  . benazepril (LOTENSIN) 5 MG tablet TAKE ONE TABLET EVERY DAY  . calcium carbonate (CALCIUM 600) 600 MG TABS tablet Take 1 tablet (600 mg total) by mouth daily.  . cholecalciferol (VITAMIN D) 1000 units tablet Take 2,000 Units by mouth daily.  Marland Kitchen docusate sodium (COLACE) 100 MG capsule Take 1 capsule (100 mg total) by mouth 2 (two) times daily as needed for mild constipation.  . fenofibrate 160 MG tablet Take 1 tablet (160 mg total) by mouth daily.  . Insulin Lispro Prot & Lispro (HUMALOG MIX 75/25 KWIKPEN) (75-25) 100 UNIT/ML Kwikpen INJECT 17 UNITS TWICE DAILY AS DIRECTED  . ketoconazole (NIZORAL) 2 % cream Apply 1 application topically daily.  . Multiple Vitamin (MULTIVITAMIN  WITH MINERALS) TABS tablet Take 1 tablet by mouth daily.  Marland Kitchen NOVOFINE PLUS 32G X 4 MM MISC USE TWICE A DAY AS DIRECTED  . Omega-3 Fatty Acids (FISH OIL) 1200 MG CAPS Take 1,200 mg by mouth daily.  . pantoprazole (PROTONIX) 40 MG tablet TAKE 1 TABLET BY MOUTH EVERY-OTHER-DAY  . polyethylene glycol powder (GLYCOLAX/MIRALAX) powder Take 17 g by mouth daily as needed for mild constipation.  . rosuvastatin (CRESTOR) 40 MG tablet TAKE ONE TABLET EVERY DAY  . triamcinolone cream (KENALOG) 0.1 % Apply 1 application topically 2 (two) times daily.   No facility-administered encounter medications on file as of 07/12/2020.    Allergies (verified) Warfarin and related   History: Past Medical History:  Diagnosis Date  . Carotid arterial disease ([place])    a. 09/2016 Carotid U/S: R carotid bifurcation dzs of 50-69%, L carotid bifurcation dzs of <50%. Patent vertebral arteries w/ anegrade flow.  . Cerebellar hemorrhage ([place]) 03/16/2015   Left Cerebellar Hemorrhage- See Care Everywhere Seen by [facility]    . Chicken pox   . CKD (chronic kidney disease), stage III (HCC)    functioning at 46%  . Diabetes mellitus without complication ([place])   . Hemorrhagic stroke ([place]) 12/31/2014   a. in the setting of warfarin therapy.  Marland Kitchen History of appendicitis 08/07/2016  . Hyperlipidemia   . Hypertension   . Syncope    a. 09/2016 Echo: EF 55-60%,  no rwma, Gr1 DD.   Past Surgical History:  Procedure Laterality Date  . ABCESS DRAINAGE  06/14/2016   appendix  . APPENDECTOMY  06/13/2016  . CATARACT EXTRACTION W/PHACO Left 09/04/2017   Procedure: CATARACT EXTRACTION PHACO AND INTRAOCULAR LENS PLACEMENT ([place]) COMPLICATED LEFT DIABETIC;  Surgeon: Leandrew Koyanagi, MD;  Location: [facility];  Service: Ophthalmology;  Laterality: Left;  Diabetic - insulin  . CATARACT EXTRACTION W/PHACO Right 10/02/2017   Procedure: CATARACT EXTRACTION PHACO AND INTRAOCULAR LENS PLACEMENT ([place]) COMPLICATED  RIGHT DIABETIC;   Surgeon: Leandrew Koyanagi, MD;  Location: [facility];  Service: Ophthalmology;  Laterality: Right;  Diabetic - insulin  . CHOLECYSTECTOMY N/A 06/17/2018   Procedure: LAPAROSCOPIC CHOLECYSTECTOMY;  Surgeon: Benjamine Sprague, DO;  Location: [facility];  Service: General;  Laterality: N/A;  . COLONOSCOPY    . NECK SURGERY     Family History  Problem Relation Age of Onset  . Diabetes Mother   . Dementia Mother   . Heart disease Mother   . Cancer Mother        lung  . Cancer Father        colon  . Stroke Daughter   . Hyperlipidemia Daughter   . Breast cancer Neg Hx    Social History   Socioeconomic History  . Marital status: Widowed    Spouse name: Not on file  . Number of children: Not on file  . Years of education: Not on file  . Highest education level: Not on file  Occupational History  . Not on file  Tobacco Use  . Smoking status: Never Smoker  . Smokeless tobacco: Never Used  Vaping Use  . Vaping Use: Never used  Substance and Sexual Activity  . Alcohol use: No    Alcohol/week: 0.0 standard drinks  . Drug use: No  . Sexual activity: Not on file  Other Topics Concern  . Not on file  Social History Narrative  . Not on file   Social Determinants of Health   Financial Resource Strain:   . Difficulty of Paying Living Expenses: Not on file  Food Insecurity: No Food Insecurity  . Worried About Charity fundraiser in the Last Year: Never true  . Ran Out of Food in the Last Year: Never true  Transportation Needs: No Transportation Needs  . Lack of Transportation (Medical): No  . Lack of Transportation (Non-Medical): No  Physical Activity: Insufficiently Active  . Days of Exercise per Week: 5 days  . Minutes of Exercise per Session: 10 min  Stress: No Stress Concern Present  . Feeling of Stress : Not at all  Social Connections: Unknown  . Frequency of Communication with Friends and Family: More than three times a week  . Frequency of Social Gatherings with  Friends and Family: More than three times a week  . Attends Religious Services: Not on file  . Active Member of Clubs or Organizations: Not on file  . Attends Archivist Meetings: Not on file  . Marital Status: Widowed    Tobacco Counseling Counseling given: Not Answered   Clinical Intake:  Pre-visit preparation completed: Yes        Diabetes: Yes (Followed by pcp)  How often do you need to have someone help you when you read instructions, pamphlets, or other written materials from your doctor or pharmacy?: 1 - Never  Nutrition Risk Assessment: Does the patient have any non-healing wounds?  No  Has the patient had any unintentional weight loss  or weight gain?  No   Diabetes: If diabetic, was a CBG obtained today?  No  Did the patient bring in their glucometer from home?  No . Virtual visit.  How often do you monitor your CBG's?Staff monitors daily.   Financial Strains and Diabetes Management: Are you having any financial strains with the device, your supplies or your medication? No .  Does the patient want to be seen by Chronic Care Management for management of their diabetes?  No  Would the patient like to be referred to a Nutritionist or for Diabetic Management?  No    Foot exam- 07/05/20 Eye exam- due 07/09/20. Plans to schedule.   Interpreter Needed?: No      Activities of Daily Living In your present state of health, do you have any difficulty performing the following activities: 07/12/2020 09/25/2019  Hearing? N N  Vision? N N  Difficulty concentrating or making decisions? Laurie Harris  Comment Memory difficulty Mild dementia  Walking or climbing stairs? Y Y  Comment - uses walker  Dressing or bathing? N N  Doing errands, shopping? Y Y  Comment - lives with daughter  Conservation officer, nature and eating ? Y Laurie Harris  Comment Staff prepares food -  Using the Toilet? N N  In the past six months, have you accidently leaked urine? N N  Do you have problems with loss of bowel  control? N N  Managing your Medications? Y Laurie Harris  Comment Staff assist daughter helps  Managing your Finances? N N  Comment Daughter assist as needed -  Housekeeping or managing your Housekeeping? Y N  Comment Staff assist lives with daughter  Some recent data might be hidden    Patient Care Team: [name], MD as PCP - General (Family Medicine) Dionisio David, MD (Cardiology)  Indicate any recent Medical Services you may have received from other than [facility] providers in the past year (date may be approximate).     Assessment:   This is a routine wellness examination for Laurie Harris.  I connected with Laurie Harris today by telephone and verified that I am speaking with the correct person using two identifiers. Location patient: home Location provider: work Persons participating in the virtual visit: patient, Laurie Harris.    I discussed the limitations, risks, security and privacy concerns of performing an evaluation and management service by telephone and the availability of in person appointments. The patient expressed understanding and verbally consented to this telephonic visit.    Interactive audio and video telecommunications were attempted between this provider and patient, however failed, due to patient having technical difficulties OR patient did not have access to video capability.  We continued and completed visit with audio only.  Some vital signs may be absent or patient reported.   Hearing/Vision screen  Hearing Screening   125Hz  250Hz  500Hz  1000Hz  2000Hz  3000Hz  4000Hz  6000Hz  8000Hz   Right ear:           Left ear:           Comments: Patient is able to hear conversational tones without difficulty.  No issues reported.  Vision Screening Comments: Wears corrective lenses  Cataract extraction, bilateral  Visual acuity not assessed, virtual visit.   Dietary issues and exercise activities discussed: Current Exercise Habits: Home exercise routine, Type of exercise: walking, Time  (Minutes): 10, Frequency (Times/Week): 5, Weekly Exercise (Minutes/Week): 50, Intensity: Mild  Healthy diet Good water intake  Goals    . Follow up with Primary Care Provider  As needed      Depression Screen PHQ 2/9 Scores 07/12/2020 07/05/2020 11/25/2019 09/25/2019 07/10/2019 07/29/2018 07/02/2018  PHQ - 2 Score 0 0 0 0 0 0 0  Exception Documentation - - - Other- indicate reason in comment box - - -    Fall Risk Fall Risk  07/12/2020 07/05/2020 11/11/2019 09/25/2019 07/10/2019  Falls in the past year? 0 0 0 1 0  Number falls in past yr: 0 0 0 0 -  Injury with Fall? - - - 1 -  Risk Factor Category  - - - - -  Risk for fall due to : - - - History of fall(s) -  Follow up Falls evaluation completed Falls evaluation completed Falls evaluation completed - -   Handrails in use? Yes Home free of loose throw rugs in walkways, pet beds, electrical cords, etc? Yes  Adequate lighting in your home to reduce risk of falls? Yes   ASSISTIVE DEVICES UTILIZED TO PREVENT FALLS: Life alert? Yes  Use of a cane, walker or w/c? Yes , walker Grab bars in the bathroom? Yes  Shower chair or bench in shower? Yes  Elevated toilet seat or a handicapped toilet? No   TIMED UP AND GO: Was the test performed? No . Virtual visit.    Cognitive Function: MMSE - Mini Mental State Exam 07/01/2017  Orientation to time 5  Orientation to Place 5  Registration 3  Attention/ Calculation 5  Recall 3  Language- name 2 objects 2  Language- repeat 1  Language- follow 3 step command 3  Language- read & follow direction 1  Write a sentence 1  Copy design 1  Total score 30     6CIT Screen 07/12/2020 07/10/2019 07/02/2018  What Year? 0 points 0 points 0 points  What month? 0 points 0 points 0 points  What time? 0 points 0 points 0 points  Count back from 20 - 0 points 0 points  Months in reverse 0 points 0 points 0 points  Repeat phrase 2 points - 0 points  Total Score - - 0   Immunizations Immunization  History  Administered Date(s) Administered  . PFIZER SARS-COV-2 Vaccination 09/28/2019, 10/19/2019, 06/22/2020  . PPD Test 10/14/2019  . Tdap 08/23/2019   Health Maintenance Health Maintenance  Topic Date Due  . PNA vac Low Risk Adult (1 of 2 - PCV13) Never done  . OPHTHALMOLOGY EXAM  07/09/2020  . COLONOSCOPY  07/12/2021 (Originally 07/20/2019)  . HEMOGLOBIN A1C  01/02/2021  . FOOT EXAM  07/05/2021  . TETANUS/TDAP  08/22/2029  . DEXA SCAN  Completed  . COVID-19 Vaccine  Completed   Colorectal cancer screening: Completed 07/19/14. Repeat every 5 years. Plans to schedule later in the season after seeing pcp in 11/2020. Deferred.   Mammogram status: No longer required.    Bone Density status: Completed 08/05/19. Results reflect: Bone density results: OSTEOPOROSIS. Repeat every 2 years. Calcium 600mg , Vitamin D 1000 U.  Lung Cancer Screening: (Low Dose CT Chest recommended if Age 32-80 years, 30 pack-year currently smoking OR have quit w/in 15years.) does not qualify.    Hepatitis C Screening: does not qualify.  Vision Screening: Recommended annual ophthalmology exams for early detection of glaucoma and other disorders of the eye. Is the patient up to date with their annual eye exam?  Yes   Dental Screening: Recommended annual dental exams for proper oral hygiene.  Community Resource Referral / Chronic Care Management: CRR required this visit?  No   CCM  required this visit?  No      Plan:   Keep all routine maintenance appointments.   Next scheduled lab 07/14/20 @ 9:30  Follow up 11/02/20 @ 9:30  I have personally reviewed and noted the following in the patient's chart:   . Medical and social history . Use of alcohol, tobacco or illicit drugs  . Current medications and supplements . Functional ability and status . Nutritional status . Physical activity . Advanced directives . List of other physicians . Hospitalizations, surgeries, and ER visits in previous 12  months . Vitals . Screenings to include cognitive, depression, and falls . Referrals and appointments  In addition, I have reviewed and discussed with patient certain preventive protocols, quality metrics, and best practice recommendations. A written personalized care plan for preventive services as well as general preventive health recommendations were provided to patient via mychart.     Varney Biles, LPN   16/02/629     Agree with plan. Mable Paris, NP

## 2020-07-14 ENCOUNTER — Other Ambulatory Visit (INDEPENDENT_AMBULATORY_CARE_PROVIDER_SITE_OTHER): Payer: Medicare Other

## 2020-07-14 ENCOUNTER — Other Ambulatory Visit: Payer: Self-pay

## 2020-07-14 DIAGNOSIS — I10 Essential (primary) hypertension: Secondary | ICD-10-CM | POA: Diagnosis not present

## 2020-07-14 LAB — BASIC METABOLIC PANEL
BUN: 32 mg/dL — ABNORMAL HIGH (ref 6–23)
CO2: 26 mEq/L (ref 19–32)
Calcium: 9.2 mg/dL (ref 8.4–10.5)
Chloride: 105 mEq/L (ref 96–112)
Creatinine, Ser: 1.19 mg/dL (ref 0.40–1.20)
GFR: 42.81 mL/min — ABNORMAL LOW (ref >60.00)
Glucose, Bld: 171 mg/dL — ABNORMAL HIGH (ref 70–99)
Potassium: 4.2 mEq/L (ref 3.5–5.1)
Sodium: 137 mEq/L (ref 135–145)

## 2020-07-21 ENCOUNTER — Telehealth: Payer: Self-pay

## 2020-07-22 DIAGNOSIS — E113293 Type 2 diabetes mellitus with mild nonproliferative diabetic retinopathy without macular edema, bilateral: Secondary | ICD-10-CM | POA: Diagnosis not present

## 2020-07-22 LAB — HM DIABETES EYE EXAM

## 2020-07-22 NOTE — Telephone Encounter (Signed)
Signed.  Please fax back. 

## 2020-07-22 NOTE — Telephone Encounter (Signed)
Faxed to [employer], confirmation given.  Rivan Siordia,cma

## 2020-08-01 NOTE — Telephone Encounter (Signed)
Laurie Harris called they haven't received this fax  Fax # [phone]

## 2020-08-04 ENCOUNTER — Encounter: Payer: Self-pay | Admitting: Cardiovascular Disease

## 2020-08-04 ENCOUNTER — Ambulatory Visit (INDEPENDENT_AMBULATORY_CARE_PROVIDER_SITE_OTHER): Payer: Medicare Other | Admitting: Cardiovascular Disease

## 2020-08-04 ENCOUNTER — Other Ambulatory Visit: Payer: Self-pay

## 2020-08-04 VITALS — BP 120/78 | HR 96 | Ht 66.0 in | Wt 123.0 lb

## 2020-08-04 DIAGNOSIS — I10 Essential (primary) hypertension: Secondary | ICD-10-CM | POA: Diagnosis not present

## 2020-08-04 DIAGNOSIS — I6523 Occlusion and stenosis of bilateral carotid arteries: Secondary | ICD-10-CM | POA: Diagnosis not present

## 2020-08-04 DIAGNOSIS — I700 Atherosclerosis of aorta: Secondary | ICD-10-CM | POA: Diagnosis not present

## 2020-08-04 DIAGNOSIS — I708 Atherosclerosis of other arteries: Secondary | ICD-10-CM | POA: Diagnosis not present

## 2020-08-04 DIAGNOSIS — R55 Syncope and collapse: Secondary | ICD-10-CM | POA: Diagnosis not present

## 2020-08-04 NOTE — Patient Instructions (Signed)
Medication Instructions:  No change in medications  *If you need a refill on your cardiac medications before your next appointment, please call your pharmacy*   Lab Work: None If you have labs (blood work) drawn today and your tests are completely normal, you will receive your results only by: Marland Kitchen MyChart Message (if you have MyChart) OR . A paper copy in the mail If you have any lab test that is abnormal or we need to change your treatment, we will call you to review the results.   Testing/Procedures: Your physician has requested that you have a carotid duplex. This test is an ultrasound of the carotid arteries in your neck. It looks at blood flow through these arteries that supply the brain with blood. Allow one hour for this exam. There are no restrictions or special instructions.     Follow-Up: At [facility], you and your health needs are our priority.  As part of our continuing mission to provide you with exceptional heart care, we have created designated Provider Care Teams.  These Care Teams include your primary Cardiologist (physician) and Advanced Practice Providers (APPs -  Physician Assistants and Nurse Practitioners) who all work together to provide you with the care you need, when you need it.  We recommend signing up for the patient portal called "MyChart".  Sign up information is provided on this After Visit Summary.  MyChart is used to connect with patients for Virtual Visits (Telemedicine).  Patients are able to view lab/test results, encounter notes, upcoming appointments, etc.  Non-urgent messages can be sent to your provider as well.   To learn more about what you can do with MyChart, go to [email or web address].    Your next appointment:   6 month(s)  The format for your next appointment:   In Person  Provider:   You may see No primary care provider on file. or one of the following Advanced Practice Providers on your designated Care Team:    Murray Hodgkins, NP  Christell Faith, PA-C  Marrianne Mood, PA-C  Cadence Chester, [place]  Laurann Montana, NP    Other Instructions N/A

## 2020-08-04 NOTE — Telephone Encounter (Signed)
Laurie Harris called and states they still have not received fax about cream. Please try faxing to [phone]. They need this asap

## 2020-08-04 NOTE — Progress Notes (Signed)
Cardiology Office Note   Date:  08/04/2020   ID:  Laurie, Harris 1941-06-13, MRN [id number]  PCP:  [name], MD  Cardiologist:   Kathlyn Sacramento, MD   Chief Complaint  Patient presents with  . office visit    6 month F/U; Meds verbally reviewed with patient.      History of Present Illness: Laurie Harris is a 79 y.o. female who Is here today for a follow-up visit regarding previous syncope, chest pain and moderate bilateral carotid stenosis. She has known history of essential hypertension, hyperlipidemia, intracranial hemorrhage while on warfarin, chronic kidney disease, diabetes mellitus and carotid artery disease. She had a prior syncopal episode which was felt to be vasovagal with a background of volume depletion. Carotid Doppler showed moderate bilateral carotid disease worse on the right side at 50-69%. Echocardiogram showed normal LV systolic function with grade 1 diastolic dysfunction which was normal.   She was hospitalized at [facility] in April with left lower quadrant abdominal pain.  CT showed evidence of diverticulitis.  She was treated with antibiotics.  She did report a syncopal episode after if she received the first and second doses of COVID-19 vaccination earlier this year.  She still lives at [facility] but she is really not happy with the care there.  Unfortunately, she does not seem to have other options. She has been stable from a medical standpoint overall with no recent chest pain.  Shortness of breath is stable.  No recent syncope.   Past Medical History:  Diagnosis Date  . Carotid arterial disease ([place])    a. 09/2016 Carotid U/S: R carotid bifurcation dzs of 50-69%, L carotid bifurcation dzs of <50%. Patent vertebral arteries w/ anegrade flow.  . Cerebellar hemorrhage ([place]) 03/16/2015   Left Cerebellar Hemorrhage- See Care Everywhere Seen by [facility]    . Chicken pox   . CKD (chronic kidney disease), stage III (HCC)     functioning at 46%  . Diabetes mellitus without complication ([place])   . Hemorrhagic stroke ([place]) 12/31/2014   a. in the setting of warfarin therapy.  Marland Kitchen History of appendicitis 08/07/2016  . Hyperlipidemia   . Hypertension   . Syncope    a. 09/2016 Echo: EF 55-60%, no rwma, Gr1 DD.    Past Surgical History:  Procedure Laterality Date  . ABCESS DRAINAGE  06/14/2016   appendix  . APPENDECTOMY  06/13/2016  . CATARACT EXTRACTION W/PHACO Left 09/04/2017   Procedure: CATARACT EXTRACTION PHACO AND INTRAOCULAR LENS PLACEMENT ([place]) COMPLICATED LEFT DIABETIC;  Surgeon: Leandrew Koyanagi, MD;  Location: [facility];  Service: Ophthalmology;  Laterality: Left;  Diabetic - insulin  . CATARACT EXTRACTION W/PHACO Right 10/02/2017   Procedure: CATARACT EXTRACTION PHACO AND INTRAOCULAR LENS PLACEMENT ([place]) COMPLICATED  RIGHT DIABETIC;  Surgeon: Leandrew Koyanagi, MD;  Location: [place];  Service: Ophthalmology;  Laterality: Right;  Diabetic - insulin  . CHOLECYSTECTOMY N/A 06/17/2018   Procedure: LAPAROSCOPIC CHOLECYSTECTOMY;  Surgeon: Benjamine Sprague, DO;  Location: [facility];  Service: General;  Laterality: N/A;  . COLONOSCOPY    . NECK SURGERY       Current Outpatient Medications  Medication Sig Dispense Refill  . aspirin EC 81 MG tablet Take 81 mg by mouth daily.    . benazepril (LOTENSIN) 5 MG tablet TAKE ONE TABLET EVERY DAY 90 tablet 2  . calcium carbonate (CALCIUM 600) 600 MG TABS tablet Take 1 tablet (600 mg total) by mouth daily. 30 tablet  3  . cholecalciferol (VITAMIN D) 1000 units tablet Take 2,000 Units by mouth daily.    Marland Kitchen docusate sodium (COLACE) 100 MG capsule Take 1 capsule (100 mg total) by mouth 2 (two) times daily as needed for mild constipation. 10 capsule 0  . fenofibrate 160 MG tablet Take 1 tablet (160 mg total) by mouth daily. 90 tablet 1  . insulin lispro protamine-lispro (HUMALOG 75/25 MIX) (75-25) 100 UNIT/ML SUSP injection Inject 16 Units into the skin 2  (two) times daily with a meal.    . ketoconazole (NIZORAL) 2 % cream Apply 1 application topically daily. 15 g 0  . MAGNESIUM OXIDE 400 PO Take 400 mg by mouth daily.    . Multiple Vitamin (MULTIVITAMIN WITH MINERALS) TABS tablet Take 1 tablet by mouth daily.    Marland Kitchen NOVOFINE PLUS 32G X 4 MM MISC USE TWICE A DAY AS DIRECTED 100 each 6  . pantoprazole (PROTONIX) 40 MG tablet TAKE 1 TABLET BY MOUTH EVERY-OTHER-DAY 15 tablet 10  . polyethylene glycol powder (GLYCOLAX/MIRALAX) powder Take 17 g by mouth daily as needed for mild constipation. 500 g 0  . rosuvastatin (CRESTOR) 40 MG tablet TAKE ONE TABLET EVERY DAY 90 tablet 3  . triamcinolone cream (KENALOG) 0.1 % Apply 1 application topically 2 (two) times daily. 30 g 0   No current facility-administered medications for this visit.    Allergies:   Warfarin and related    Social History:  The patient  reports that she has never smoked. She has never used smokeless tobacco. She reports that she does not drink alcohol and does not use drugs.   Family History:  The patient's family history includes Cancer in her father and mother; Dementia in her mother; Diabetes in her mother; Heart disease in her mother; Hyperlipidemia in her daughter; Stroke in her daughter.    ROS:  Please see the history of present illness.   Otherwise, review of systems are positive for none.   All other systems are reviewed and negative.    PHYSICAL EXAM: VS:  BP 120/78 (BP Location: Right Arm, Patient Position: Sitting, Cuff Size: Normal)   Pulse 96   Ht 5\' 6"  (1.676 m)   Wt 123 lb (55.8 kg)   SpO2 97%   BMI 19.85 kg/m  , BMI Body mass index is 19.85 kg/m. GEN: Well nourished, well developed, in no acute distress  HEENT: normal  Neck: no JVD, or masses. Bilateral carotid bruits  Cardiac: RRR; no murmurs, rubs, or gallops,no edema  Respiratory:  clear to auscultation bilaterally, normal work of breathing GI: soft, nontender, nondistended, + BS MS: no deformity or  atrophy  Skin: warm and dry, no rash Neuro:  Strength and sensation are intact Psych: euthymic mood, full affect   EKG:  EKG is ordered today. The ekg ordered today demonstrates normal sinus rhythm with no significant ST or T wave changes.  Minimal LVH  Recent Labs: 08/23/2019: Magnesium 1.8 11/05/2019: TSH 1.63 12/08/2019: ALT 25; Hemoglobin 12.9; Platelets 188 07/14/2020: BUN 32; Creatinine, Ser 1.19; Potassium 4.2; Sodium 137    Lipid Panel    Component Value Date/Time   CHOL 122 11/05/2019 0000   TRIG 167 (A) 11/05/2019 0000   HDL 37 11/05/2019 0000   CHOLHDL 3.3 09/19/2019 0442   VLDL 42 (H) 09/19/2019 0442   LDLCALC 52 11/05/2019 0000   LDLDIRECT 64.0 08/05/2019 0949      Wt Readings from Last 3 Encounters:  08/04/20 123 lb (55.8 kg)  07/12/20  121 lb (54.9 kg)  07/05/20 121 lb 3.2 oz (55 kg)       PAD Screen 10/15/2016  Previous PAD dx? No  Previous surgical procedure? No  Pain with walking? No  Feet/toe relief with dangling? No  Painful, non-healing ulcers? No  Extremities discolored? No      ASSESSMENT AND PLAN:  1. Bilateral carotid disease: She is due for repeat carotid Doppler which was requested today to be done in January.  Continue low-dose aspirin and treatment of hyperlipidemia.   2. Hyperlipidemia: Continue fenofibrate and rosuvastatin.  Most recent lipid profile showed an LDL of 52.  3. Essential hypertension: Blood pressure is reasonably controlled.  Continue benazepril.  4.  History of syncope: No recent episodes  Disposition:   FU with me in 6 months.  Signed,  Kathlyn Sacramento, MD  08/04/2020 11:18 AM    [place]

## 2020-08-04 NOTE — Telephone Encounter (Signed)
This has been faxed to [facility] on 07/22/2020. I have confirmation,  I sent the signed confirmation to [place] [facility] to both numbers for faxing and confirmation was given for [phone] and [phone].  Brydon Spahr,cma

## 2020-08-05 ENCOUNTER — Telehealth: Payer: Self-pay | Admitting: Family Medicine

## 2020-08-15 DIAGNOSIS — Z20828 Contact with and (suspected) exposure to other viral communicable diseases: Secondary | ICD-10-CM | POA: Diagnosis not present

## 2020-08-15 DIAGNOSIS — U071 COVID-19: Secondary | ICD-10-CM | POA: Diagnosis not present

## 2020-08-22 DIAGNOSIS — U071 COVID-19: Secondary | ICD-10-CM | POA: Diagnosis not present

## 2020-08-22 DIAGNOSIS — Z20828 Contact with and (suspected) exposure to other viral communicable diseases: Secondary | ICD-10-CM | POA: Diagnosis not present

## 2020-09-05 DIAGNOSIS — Z20828 Contact with and (suspected) exposure to other viral communicable diseases: Secondary | ICD-10-CM | POA: Diagnosis not present

## 2020-09-05 DIAGNOSIS — U071 COVID-19: Secondary | ICD-10-CM | POA: Diagnosis not present

## 2020-09-06 ENCOUNTER — Other Ambulatory Visit: Payer: Self-pay | Admitting: Family Medicine

## 2020-09-12 DIAGNOSIS — Z20828 Contact with and (suspected) exposure to other viral communicable diseases: Secondary | ICD-10-CM | POA: Diagnosis not present

## 2020-09-12 DIAGNOSIS — U071 COVID-19: Secondary | ICD-10-CM | POA: Diagnosis not present

## 2020-09-20 DIAGNOSIS — U071 COVID-19: Secondary | ICD-10-CM | POA: Diagnosis not present

## 2020-09-20 DIAGNOSIS — Z20828 Contact with and (suspected) exposure to other viral communicable diseases: Secondary | ICD-10-CM | POA: Diagnosis not present

## 2020-10-03 DIAGNOSIS — U071 COVID-19: Secondary | ICD-10-CM | POA: Diagnosis not present

## 2020-10-03 DIAGNOSIS — Z20828 Contact with and (suspected) exposure to other viral communicable diseases: Secondary | ICD-10-CM | POA: Diagnosis not present

## 2020-10-10 ENCOUNTER — Telehealth: Payer: Self-pay | Admitting: Family Medicine

## 2020-10-10 DIAGNOSIS — U071 COVID-19: Secondary | ICD-10-CM | POA: Diagnosis not present

## 2020-10-10 DIAGNOSIS — Z20828 Contact with and (suspected) exposure to other viral communicable diseases: Secondary | ICD-10-CM | POA: Diagnosis not present

## 2020-10-10 NOTE — Telephone Encounter (Signed)
I can not send in a refill due to historical provider filled last. Please advise.

## 2020-10-10 NOTE — Telephone Encounter (Signed)
Pt needs a refill on MAGNESIUM OXIDE 400 PO send to [place]

## 2020-10-11 NOTE — Telephone Encounter (Signed)
Patient's pharmacy called to see it this mediation was going to be filled. See note below.

## 2020-10-12 DIAGNOSIS — U071 COVID-19: Secondary | ICD-10-CM | POA: Diagnosis not present

## 2020-10-12 DIAGNOSIS — M79675 Pain in left toe(s): Secondary | ICD-10-CM | POA: Diagnosis not present

## 2020-10-12 DIAGNOSIS — B351 Tinea unguium: Secondary | ICD-10-CM | POA: Diagnosis not present

## 2020-10-12 DIAGNOSIS — Z20828 Contact with and (suspected) exposure to other viral communicable diseases: Secondary | ICD-10-CM | POA: Diagnosis not present

## 2020-10-12 DIAGNOSIS — E1069 Type 1 diabetes mellitus with other specified complication: Secondary | ICD-10-CM | POA: Diagnosis not present

## 2020-10-13 NOTE — Telephone Encounter (Signed)
Patient has been informed.

## 2020-10-13 NOTE — Telephone Encounter (Signed)
I would like to have her in to have her magnesium rechecked prior to refilling this. It has not been low in some time so I would like to make sure she still needs this supplement.

## 2020-10-13 NOTE — Telephone Encounter (Signed)
Louann called and has not heard back from office about patient's MAGNESIUM OXIDE 400 PO

## 2020-10-17 DIAGNOSIS — Z20828 Contact with and (suspected) exposure to other viral communicable diseases: Secondary | ICD-10-CM | POA: Diagnosis not present

## 2020-10-17 DIAGNOSIS — U071 COVID-19: Secondary | ICD-10-CM | POA: Diagnosis not present

## 2020-10-18 ENCOUNTER — Telehealth: Payer: Self-pay | Admitting: Family Medicine

## 2020-10-18 NOTE — Telephone Encounter (Signed)
I received an FL 2 to sign for this patient.  Can you contact the patient and confirm that this needs to be filled out for [facility]?  Thanks.

## 2020-10-24 ENCOUNTER — Encounter: Payer: Self-pay | Admitting: Family Medicine

## 2020-10-24 DIAGNOSIS — Z20828 Contact with and (suspected) exposure to other viral communicable diseases: Secondary | ICD-10-CM | POA: Diagnosis not present

## 2020-10-24 DIAGNOSIS — U071 COVID-19: Secondary | ICD-10-CM | POA: Diagnosis not present

## 2020-10-25 ENCOUNTER — Ambulatory Visit (INDEPENDENT_AMBULATORY_CARE_PROVIDER_SITE_OTHER): Payer: Medicare Other

## 2020-10-25 ENCOUNTER — Other Ambulatory Visit: Payer: Self-pay

## 2020-10-25 DIAGNOSIS — I6523 Occlusion and stenosis of bilateral carotid arteries: Secondary | ICD-10-CM | POA: Diagnosis not present

## 2020-10-25 NOTE — Telephone Encounter (Signed)
I called [employer] and The med tech stated she did not know if the FL-2 was needed but if it was sent to [name] it needs to be filled out and faxed back.  Deandre Brannan,cma

## 2020-10-26 NOTE — Telephone Encounter (Signed)
Signed. Please mail back.

## 2020-10-28 NOTE — Telephone Encounter (Signed)
FL-2 mailed to addressed envelope copy scanned to chart.Laurie Harris

## 2020-11-02 ENCOUNTER — Ambulatory Visit: Payer: Medicare Other | Admitting: Family Medicine

## 2020-11-02 ENCOUNTER — Other Ambulatory Visit: Payer: Self-pay

## 2020-11-02 DIAGNOSIS — I779 Disorder of arteries and arterioles, unspecified: Secondary | ICD-10-CM

## 2020-11-07 ENCOUNTER — Telehealth: Payer: Self-pay | Admitting: Family Medicine

## 2020-11-07 DIAGNOSIS — Z20828 Contact with and (suspected) exposure to other viral communicable diseases: Secondary | ICD-10-CM | POA: Diagnosis not present

## 2020-11-07 DIAGNOSIS — U071 COVID-19: Secondary | ICD-10-CM | POA: Diagnosis not present

## 2020-11-07 NOTE — Telephone Encounter (Signed)
She needs to have her magnesium levels rechecked prior to having this refilled.  We can try to get this done in our lab if she is willing to.

## 2020-11-07 NOTE — Telephone Encounter (Signed)
[facility] called in wanted to know if Dr.Sonnenberg wanted patient to stay on this medication you can contact them at [place] 400 PO

## 2020-11-07 NOTE — Telephone Encounter (Signed)
[facility] called in wanted to know if Dr.Sonnenberg wanted patient to stay on this medication you can contact them at [place] 400 PO.  Azhar Knope,cma

## 2020-11-08 NOTE — Telephone Encounter (Signed)
I called the patients daughter and she stated she wanted to have the labs done at her visit on April 6 and wants to know if its ok to wait to check her magnesium.  Laurie Harris,cma

## 2020-11-09 NOTE — Telephone Encounter (Signed)
It should be fine to wait until then to check it.

## 2020-12-01 ENCOUNTER — Other Ambulatory Visit: Payer: Self-pay | Admitting: Family Medicine

## 2020-12-07 ENCOUNTER — Other Ambulatory Visit: Payer: Self-pay

## 2020-12-07 ENCOUNTER — Ambulatory Visit (INDEPENDENT_AMBULATORY_CARE_PROVIDER_SITE_OTHER): Payer: Medicare Other | Admitting: Family Medicine

## 2020-12-07 ENCOUNTER — Encounter: Payer: Self-pay | Admitting: Family Medicine

## 2020-12-07 DIAGNOSIS — E7800 Pure hypercholesterolemia, unspecified: Secondary | ICD-10-CM

## 2020-12-07 DIAGNOSIS — I700 Atherosclerosis of aorta: Secondary | ICD-10-CM | POA: Diagnosis not present

## 2020-12-07 DIAGNOSIS — I708 Atherosclerosis of other arteries: Secondary | ICD-10-CM

## 2020-12-07 DIAGNOSIS — E1129 Type 2 diabetes mellitus with other diabetic kidney complication: Secondary | ICD-10-CM

## 2020-12-07 DIAGNOSIS — Z794 Long term (current) use of insulin: Secondary | ICD-10-CM

## 2020-12-07 DIAGNOSIS — I10 Essential (primary) hypertension: Secondary | ICD-10-CM | POA: Diagnosis not present

## 2020-12-07 LAB — COMPREHENSIVE METABOLIC PANEL
ALT: 25 U/L (ref 0–35)
AST: 33 U/L (ref 0–37)
Albumin: 4.1 g/dL (ref 3.5–5.2)
Alkaline Phosphatase: 67 U/L (ref 39–117)
BUN: 26 mg/dL — ABNORMAL HIGH (ref 6–23)
CO2: 24 mEq/L (ref 19–32)
Calcium: 10 mg/dL (ref 8.4–10.5)
Chloride: 108 mEq/L (ref 96–112)
Creatinine, Ser: 1.3 mg/dL — ABNORMAL HIGH (ref 0.40–1.20)
GFR: 38.39 mL/min — ABNORMAL LOW (ref >60.00)
Glucose, Bld: 140 mg/dL — ABNORMAL HIGH (ref 70–99)
Potassium: 4.5 mEq/L (ref 3.5–5.1)
Sodium: 139 mEq/L (ref 135–145)
Total Bilirubin: 0.7 mg/dL (ref 0.2–1.2)
Total Protein: 6.4 g/dL (ref 6.0–8.3)

## 2020-12-07 LAB — HEMOGLOBIN A1C: Hgb A1c MFr Bld: 5.8 % (ref 4.6–6.5)

## 2020-12-07 LAB — LIPID PANEL
Cholesterol: 107 mg/dL (ref 0–200)
HDL: 30.1 mg/dL — ABNORMAL LOW (ref >39.00)
NonHDL: 77.24
Total CHOL/HDL Ratio: 4
Triglycerides: 232 mg/dL — ABNORMAL HIGH (ref 0.0–149.0)
VLDL: 46.4 mg/dL — ABNORMAL HIGH (ref 0.0–40.0)

## 2020-12-07 LAB — MAGNESIUM: Magnesium: 1.7 mg/dL (ref 1.5–2.5)

## 2020-12-07 LAB — LDL CHOLESTEROL, DIRECT: Direct LDL: 50 mg/dL

## 2020-12-07 NOTE — Patient Instructions (Signed)
Nice to see you. We will get lab work today.  

## 2020-12-07 NOTE — Assessment & Plan Note (Signed)
Check magnesium level today.

## 2020-12-07 NOTE — Assessment & Plan Note (Signed)
Check lipid panel  

## 2020-12-07 NOTE — Assessment & Plan Note (Signed)
Check A1c.  Continue Humalog 75/25 16 units twice daily.

## 2020-12-07 NOTE — Progress Notes (Signed)
Laurie Rumps, MD Phone: [phone]  Laurie Harris is a 80 y.o. female who presents today for f/u.  HYPERTENSION  Disease Monitoring  Home BP Monitoring generally well controlled Chest pain- no    Dyspnea- no Medications  Compliance-  Taking benazepril 5 mg daily.  Edema- no  DIABETES Disease Monitoring: Blood Sugar ranges-stable Polyuria/phagia/dipsia- no      Optho- UTD Medications: Compliance- taking humalog 75/25 16 u BID  Hypoglycemic symptoms- rare, feels weak  History of low magnesium: Patient was previously on a magnesium supplement though has been off of this for some time now.  Due for recheck today.   Social History   Tobacco Use  Smoking Status Never Smoker  Smokeless Tobacco Never Used    Current Outpatient Medications on File Prior to Visit  Medication Sig Dispense Refill  . aspirin EC 81 MG tablet Take 81 mg by mouth daily.    . benazepril (LOTENSIN) 5 MG tablet TAKE ONE TABLET EVERY DAY 90 tablet 2  . calcium carbonate (OS-CAL) 600 MG TABS tablet Take one tablet by mouth once a day. 30 tablet 10  . cholecalciferol (VITAMIN D) 1000 units tablet Take 2,000 Units by mouth daily.    . Cholecalciferol (VITAMIN D3) 50 MCG (2000 UT) capsule Take one tablet by mouth daily.  30 capsule 10  . docusate sodium (COLACE) 100 MG capsule Take 1 capsule (100 mg total) by mouth 2 (two) times daily as needed for mild constipation. 10 capsule 0  . fenofibrate 160 MG tablet Take 1 tablet (160 mg total) by mouth daily. 90 tablet 1  . insulin lispro protamine-lispro (HUMALOG 75/25 MIX) (75-25) 100 UNIT/ML SUSP injection Inject 16 Units into the skin 2 (two) times daily with a meal.    . ketoconazole (NIZORAL) 2 % cream Apply 1 application topically daily. 15 g 0  . MAGNESIUM OXIDE 400 PO Take 400 mg by mouth daily.    . Multiple Vitamin (MULTIVITAMIN WITH MINERALS) TABS tablet Take 1 tablet by mouth daily.    Marland Kitchen NOVOFINE PLUS 32G X 4 MM MISC USE TWICE A DAY AS DIRECTED 100  each 6  . pantoprazole (PROTONIX) 40 MG tablet TAKE 1 TABLET BY MOUTH EVERY-OTHER-DAY 15 tablet 10  . polyethylene glycol powder (GLYCOLAX/MIRALAX) powder Take 17 g by mouth daily as needed for mild constipation. 500 g 0  . rosuvastatin (CRESTOR) 40 MG tablet TAKE ONE TABLET EVERY DAY 90 tablet 3  . triamcinolone cream (KENALOG) 0.1 % Apply 1 application topically 2 (two) times daily. 30 g 0   No current facility-administered medications on file prior to visit.     ROS see history of present illness  Objective  Physical Exam Vitals:   12/07/20 0941  BP: 125/70  Pulse: 68  Temp: 97.6 F (36.4 C)  SpO2: 99%    BP Readings from Last 3 Encounters:  12/07/20 125/70  08/04/20 120/78  07/05/20 (!) 150/80   Wt Readings from Last 3 Encounters:  12/07/20 119 lb 9.6 oz (54.3 kg)  08/04/20 123 lb (55.8 kg)  07/12/20 121 lb (54.9 kg)    Physical Exam Constitutional:      General: She is not in acute distress.    Appearance: She is not diaphoretic.  Cardiovascular:     Rate and Rhythm: Normal rate and regular rhythm.     Heart sounds: Normal heart sounds.  Pulmonary:     Effort: Pulmonary effort is normal.     Breath sounds: Normal breath sounds.  Skin:    General: Skin is warm and dry.  Neurological:     Mental Status: She is alert.      Assessment/Plan: Please see individual problem list.  Problem List Items Addressed This Visit    Aorto-iliac atherosclerosis ([place])    Continue risk factor management.      Benign essential HTN    Adequate control.  She will continue benazepril 5 mg once daily.  Check labs.      Relevant Orders   Comp Met (CMET)   History of hypomagnesemia    Check magnesium level today.      Relevant Orders   Magnesium   Hyperlipidemia    Check lipid panel.      Relevant Orders   Lipid panel   Type II diabetes mellitus with renal manifestations (HCC)    Check A1c.  Continue Humalog 75/25 16 units twice daily.      Relevant Orders    HgB A1c       This visit occurred during the SARS-CoV-2 public health emergency.  Safety protocols were in place, including screening questions prior to the visit, additional usage of staff PPE, and extensive cleaning of exam room while observing appropriate contact time as indicated for disinfecting solutions.    Laurie Rumps, MD [place]

## 2020-12-07 NOTE — Assessment & Plan Note (Signed)
Continue risk factor management. 

## 2020-12-07 NOTE — Assessment & Plan Note (Signed)
Adequate control.  She will continue benazepril 5 mg once daily.  Check labs.

## 2020-12-21 ENCOUNTER — Other Ambulatory Visit: Payer: Self-pay | Admitting: Family Medicine

## 2020-12-21 MED ORDER — INSULIN LISPRO PROT & LISPRO (75-25 MIX) 100 UNIT/ML ~~LOC~~ SUSP: 14.0000 [IU] | Freq: Two times a day (BID) | SUBCUTANEOUS | 0 refills | Status: DC

## 2021-01-05 ENCOUNTER — Other Ambulatory Visit: Payer: Self-pay

## 2021-01-05 ENCOUNTER — Encounter: Payer: Self-pay | Admitting: Cardiovascular Disease

## 2021-01-05 ENCOUNTER — Ambulatory Visit (INDEPENDENT_AMBULATORY_CARE_PROVIDER_SITE_OTHER): Payer: Medicare Other | Admitting: Cardiovascular Disease

## 2021-01-05 VITALS — BP 130/64 | HR 79 | Ht 66.0 in | Wt 120.0 lb

## 2021-01-05 DIAGNOSIS — I10 Essential (primary) hypertension: Secondary | ICD-10-CM | POA: Diagnosis not present

## 2021-01-05 DIAGNOSIS — E785 Hyperlipidemia, unspecified: Secondary | ICD-10-CM

## 2021-01-05 DIAGNOSIS — I700 Atherosclerosis of aorta: Secondary | ICD-10-CM

## 2021-01-05 DIAGNOSIS — Z87898 Personal history of other specified conditions: Secondary | ICD-10-CM | POA: Diagnosis not present

## 2021-01-05 DIAGNOSIS — I708 Atherosclerosis of other arteries: Secondary | ICD-10-CM | POA: Diagnosis not present

## 2021-01-05 DIAGNOSIS — I6523 Occlusion and stenosis of bilateral carotid arteries: Secondary | ICD-10-CM

## 2021-01-05 NOTE — Patient Instructions (Signed)

## 2021-01-05 NOTE — Progress Notes (Signed)
Cardiology Office Note   Date:  01/05/2021   ID:  Laurie Harris, DOB 1941-01-11, MRN [id number]  PCP:  [name], MD  Cardiologist:   Kathlyn Sacramento, MD   Chief Complaint  Patient presents with  . Other    6 month f/u no complaints today. Meds reviewed verbally with pt.      History of Present Illness: Laurie Harris is a 80 y.o. female who Is here today for a follow-up visit regarding previous syncope, chest pain and moderate bilateral carotid stenosis. She has known history of essential hypertension, hyperlipidemia, intracranial hemorrhage while on warfarin, chronic kidney disease, diabetes mellitus and carotid artery disease. She had a prior syncopal episode which was felt to be vasovagal with a background of volume depletion. Carotid Doppler showed moderate bilateral carotid disease worse on the right side at 50-69%. Echocardiogram showed normal LV systolic function with grade 1 diastolic dysfunction.   She was hospitalized in April 2021 with diverticulitis and improved with antibiotics. She still lives at [facility] .  She has been doing reasonably well and denies any chest pain, shortness of breath, dizziness or syncope.   Past Medical History:  Diagnosis Date  . Carotid arterial disease ([place])    a. 09/2016 Carotid U/S: R carotid bifurcation dzs of 50-69%, L carotid bifurcation dzs of <50%. Patent vertebral arteries w/ anegrade flow.  . Cerebellar hemorrhage ([place]) 03/16/2015   Left Cerebellar Hemorrhage- See Care Everywhere Seen by [facility]    . Chicken pox   . CKD (chronic kidney disease), stage III (HCC)    functioning at 46%  . Diabetes mellitus without complication ([place])   . Hemorrhagic stroke ([place]) 12/31/2014   a. in the setting of warfarin therapy.  Marland Kitchen History of appendicitis 08/07/2016  . Hyperlipidemia   . Hypertension   . Syncope    a. 09/2016 Echo: EF 55-60%, no rwma, Gr1 DD.    Past Surgical History:  Procedure Laterality Date   . ABCESS DRAINAGE  06/14/2016   appendix  . APPENDECTOMY  06/13/2016  . CATARACT EXTRACTION W/PHACO Left 09/04/2017   Procedure: CATARACT EXTRACTION PHACO AND INTRAOCULAR LENS PLACEMENT ([place]) COMPLICATED LEFT DIABETIC;  Surgeon: Leandrew Koyanagi, MD;  Location: [place];  Service: Ophthalmology;  Laterality: Left;  Diabetic - insulin  . CATARACT EXTRACTION W/PHACO Right 10/02/2017   Procedure: CATARACT EXTRACTION PHACO AND INTRAOCULAR LENS PLACEMENT ([place]) COMPLICATED  RIGHT DIABETIC;  Surgeon: Leandrew Koyanagi, MD;  Location: [place];  Service: Ophthalmology;  Laterality: Right;  Diabetic - insulin  . CHOLECYSTECTOMY N/A 06/17/2018   Procedure: LAPAROSCOPIC CHOLECYSTECTOMY;  Surgeon: Benjamine Sprague, DO;  Location: [facility];  Service: General;  Laterality: N/A;  . COLONOSCOPY    . NECK SURGERY       Current Outpatient Medications  Medication Sig Dispense Refill  . aspirin EC 81 MG tablet Take 81 mg by mouth daily.    . benazepril (LOTENSIN) 5 MG tablet TAKE ONE TABLET EVERY DAY 90 tablet 2  . calcium carbonate (OS-CAL) 600 MG TABS tablet Take one tablet by mouth once a day. 30 tablet 10  . Cholecalciferol (VITAMIN D3) 50 MCG (2000 UT) capsule Take one tablet by mouth daily.  30 capsule 10  . docusate sodium (COLACE) 100 MG capsule Take 1 capsule (100 mg total) by mouth 2 (two) times daily as needed for mild constipation. 10 capsule 0  . fenofibrate 160 MG tablet Take 1 tablet (160 mg total) by mouth daily.  90 tablet 1  . insulin lispro protamine-lispro (HUMALOG 75/25 MIX) (75-25) 100 UNIT/ML SUSP injection Inject 14 Units into the skin 2 (two) times daily with a meal. 10 mL 0  . Multiple Vitamin (MULTIVITAMIN WITH MINERALS) TABS tablet Take 1 tablet by mouth daily.    Marland Kitchen NOVOFINE PLUS 32G X 4 MM MISC USE TWICE A DAY AS DIRECTED 100 each 6  . pantoprazole (PROTONIX) 40 MG tablet TAKE 1 TABLET BY MOUTH EVERY-OTHER-DAY 15 tablet 10  . rosuvastatin (CRESTOR) 40 MG  tablet TAKE ONE TABLET EVERY DAY 90 tablet 3  . triamcinolone cream (KENALOG) 0.1 % Apply 1 application topically 2 (two) times daily. 30 g 0   No current facility-administered medications for this visit.    Allergies:   Warfarin and related    Social History:  The patient  reports that she has never smoked. She has never used smokeless tobacco. She reports that she does not drink alcohol and does not use drugs.   Family History:  The patient's family history includes Cancer in her father and mother; Dementia in her mother; Diabetes in her mother; Heart disease in her mother; Hyperlipidemia in her daughter; Stroke in her daughter.    ROS:  Please see the history of present illness.   Otherwise, review of systems are positive for none.   All other systems are reviewed and negative.    PHYSICAL EXAM: VS:  BP 130/64 (BP Location: Left Arm, Patient Position: Sitting, Cuff Size: Normal)   Pulse 79   Ht 5\' 6"  (1.676 m)   Wt 120 lb (54.4 kg)   SpO2 99%   BMI 19.37 kg/m  , BMI Body mass index is 19.37 kg/m. GEN: Well nourished, well developed, in no acute distress  HEENT: normal  Neck: no JVD, or masses. Bilateral carotid bruits  Cardiac: RRR; no murmurs, rubs, or gallops,no edema  Respiratory:  clear to auscultation bilaterally, normal work of breathing GI: soft, nontender, nondistended, + BS MS: no deformity or atrophy  Skin: warm and dry, no rash Neuro:  Strength and sensation are intact Psych: euthymic mood, full affect   EKG:  EKG is ordered today. The ekg ordered today demonstrates normal sinus rhythm with left axis deviation and minimal LVH.    Recent Labs: 12/07/2020: ALT 25; BUN 26; Creatinine, Ser 1.30; Magnesium 1.7; Potassium 4.5; Sodium 139    Lipid Panel    Component Value Date/Time   CHOL 107 12/07/2020 0953   TRIG 232.0 (H) 12/07/2020 0953   HDL 30.10 (L) 12/07/2020 0953   CHOLHDL 4 12/07/2020 0953   VLDL 46.4 (H) 12/07/2020 0953   LDLCALC 52 11/05/2019  0000   LDLDIRECT 50.0 12/07/2020 0953      Wt Readings from Last 3 Encounters:  01/05/21 120 lb (54.4 kg)  12/07/20 119 lb 9.6 oz (54.3 kg)  08/04/20 123 lb (55.8 kg)       PAD Screen 10/15/2016  Previous PAD dx? No  Previous surgical procedure? No  Pain with walking? No  Feet/toe relief with dangling? No  Painful, non-healing ulcers? No  Extremities discolored? No      ASSESSMENT AND PLAN:  1. Bilateral carotid disease: Most recent carotid Doppler in February of this year showed stable 40 to 59% right carotid stenosis.  Repeat study in February 2023.    Continue low-dose aspirin and treatment of hyperlipidemia.   2. Hyperlipidemia: Continue fenofibrate and rosuvastatin.  Most recent lipid profile showed an LDL of 50 and triglyceride of 232.  3. Essential hypertension: Blood pressure is reasonably controlled.  Continue benazepril.  4.  History of syncope: No recent episodes   Disposition:   FU with me in 12 months.  Signed,  Kathlyn Sacramento, MD  01/05/2021 10:18 AM    [place]

## 2021-01-10 ENCOUNTER — Other Ambulatory Visit: Payer: Self-pay | Admitting: Family Medicine

## 2021-01-10 DIAGNOSIS — E785 Hyperlipidemia, unspecified: Secondary | ICD-10-CM

## 2021-02-21 DIAGNOSIS — Z23 Encounter for immunization: Secondary | ICD-10-CM | POA: Diagnosis not present

## 2021-04-12 ENCOUNTER — Other Ambulatory Visit: Payer: Self-pay | Admitting: Family Medicine

## 2021-05-04 ENCOUNTER — Other Ambulatory Visit: Payer: Self-pay | Admitting: Family Medicine

## 2021-05-18 ENCOUNTER — Telehealth: Payer: Self-pay

## 2021-05-18 DIAGNOSIS — E1129 Type 2 diabetes mellitus with other diabetic kidney complication: Secondary | ICD-10-CM

## 2021-05-18 MED ORDER — INSULIN ASPART PROT & ASPART (70-30 MIX) 100 UNIT/ML ~~LOC~~ SUSP: 14.0000 [IU] | Freq: Two times a day (BID) | SUBCUTANEOUS | 0 refills | Status: DC

## 2021-05-20 NOTE — Telephone Encounter (Signed)
Noted  

## 2021-06-09 ENCOUNTER — Ambulatory Visit (INDEPENDENT_AMBULATORY_CARE_PROVIDER_SITE_OTHER): Payer: Medicare Other | Admitting: Family Medicine

## 2021-06-09 ENCOUNTER — Other Ambulatory Visit: Payer: Self-pay

## 2021-06-09 DIAGNOSIS — Z794 Long term (current) use of insulin: Secondary | ICD-10-CM | POA: Diagnosis not present

## 2021-06-09 DIAGNOSIS — R413 Other amnesia: Secondary | ICD-10-CM

## 2021-06-09 DIAGNOSIS — I10 Essential (primary) hypertension: Secondary | ICD-10-CM

## 2021-06-09 DIAGNOSIS — E1129 Type 2 diabetes mellitus with other diabetic kidney complication: Secondary | ICD-10-CM | POA: Diagnosis not present

## 2021-06-09 DIAGNOSIS — I708 Atherosclerosis of other arteries: Secondary | ICD-10-CM

## 2021-06-09 DIAGNOSIS — E7800 Pure hypercholesterolemia, unspecified: Secondary | ICD-10-CM | POA: Diagnosis not present

## 2021-06-09 DIAGNOSIS — I700 Atherosclerosis of aorta: Secondary | ICD-10-CM

## 2021-06-09 LAB — BASIC METABOLIC PANEL
BUN: 31 mg/dL — ABNORMAL HIGH (ref 6–23)
CO2: 24 mEq/L (ref 19–32)
Calcium: 10.5 mg/dL (ref 8.4–10.5)
Chloride: 108 mEq/L (ref 96–112)
Creatinine, Ser: 1.29 mg/dL — ABNORMAL HIGH (ref 0.40–1.20)
GFR: 38.61 mL/min — ABNORMAL LOW (ref >60.00)
Glucose, Bld: 169 mg/dL — ABNORMAL HIGH (ref 70–99)
Potassium: 4.2 mEq/L (ref 3.5–5.1)
Sodium: 140 mEq/L (ref 135–145)

## 2021-06-09 LAB — HEMOGLOBIN A1C: Hgb A1c MFr Bld: 6.4 % (ref 4.6–6.5)

## 2021-06-09 NOTE — Patient Instructions (Signed)
Nice to see you. I will check into writing a letter.  If you do not hear from [name] by the end of next week please let us know. We will contact you with your lab results.

## 2021-06-09 NOTE — Assessment & Plan Note (Signed)
Most recent LDL well controlled.  She will continue Crestor 40 mg once daily.

## 2021-06-09 NOTE — Assessment & Plan Note (Signed)
Stable.  Adequate for age.  She will continue benazepril 5 mg once daily.  Check BMP.

## 2021-06-09 NOTE — Progress Notes (Signed)
Tommi Rumps, MD Phone: [phone]  Laurie Harris is a 80 y.o. female who presents today for f/u.  HYPERTENSION Disease Monitoring: Blood pressure range-106-153/50-93 Chest pain- no      Dyspnea- no Medications: Compliance- taking benazepril   Edema- no  DIABETES Disease Monitoring: Blood Sugar ranges-fasting <140 Polyuria/phagia/dipsia- no      Optho- UTD Medications: Compliance- taking novolog 75/25 14 u BID  Hypoglycemic symptoms- no  HYPERLIPIDEMIA Disease Monitoring: See symptoms for Hypertension Medications: Compliance- taking crestor Right upper quadrant pain- no  Muscle aches- no  Memory difficulty: Patient sometimes has trouble remembering things.  They have been working on getting the patient long-term care coverage from a long-term care insurance though the patient has not met criteria previously.  Her daughter brings in paperwork today wondering if she meets criteria for the impairment in judgment as a relates to safety awareness based on prior issues back in January 2021.  MMSE completed today with a score of 28.  MMSE - Mini Mental State Exam 06/09/2021 07/01/2017  Orientation to time 5 5  Orientation to Place 4 5  Registration 3 3  Attention/ Calculation 5 5  Recall 2 3  Language- name 2 objects 2 2  Language- repeat 1 1  Language- follow 3 step command 3 3  Language- read & follow direction 1 1  Write a sentence 1 1  Copy design 1 1  Total score 28 30     Social History   Tobacco Use  Smoking Status Never  Smokeless Tobacco Never    Current Outpatient Medications on File Prior to Visit  Medication Sig Dispense Refill  . aspirin EC 81 MG tablet Take 81 mg by mouth daily.    . BD AUTOSHIELD DUO 30G X 5 MM MISC USE TWICE DAILY AS DIRECTED 100 each 1  . benazepril (LOTENSIN) 5 MG tablet TAKE ONE TABLET EVERY DAY 90 tablet 2  . calcium carbonate (OS-CAL) 600 MG TABS tablet Take one tablet by mouth once a day. 30 tablet 10  . Cholecalciferol  (VITAMIN D3) 50 MCG (2000 UT) capsule Take one tablet by mouth daily.  30 capsule 10  . docusate sodium (COLACE) 100 MG capsule Take 1 capsule (100 mg total) by mouth 2 (two) times daily as needed for mild constipation. 10 capsule 0  . fenofibrate 160 MG tablet Take 1 tablet (160 mg total) by mouth daily. 90 tablet 1  . HUMALOG MIX 75/25 KWIKPEN (75-25) 100 UNIT/ML KwikPen INJECT 17 UNITS TWICE DAILY AS DIRECTED 15 mL 1  . insulin lispro protamine-lispro (HUMALOG 75/25 MIX) (75-25) 100 UNIT/ML SUSP injection Inject 14 Units into the skin 2 (two) times daily with a meal. 10 mL 0  . Multiple Vitamin (MULTIVITAMIN WITH MINERALS) TABS tablet Take 1 tablet by mouth daily.    Marland Kitchen NOVOFINE PLUS 32G X 4 MM MISC USE TWICE A DAY AS DIRECTED 100 each 6  . pantoprazole (PROTONIX) 40 MG tablet TAKE 1 TABLET BY MOUTH EVERY-OTHER-DAY 15 tablet 10  . rosuvastatin (CRESTOR) 40 MG tablet TAKE (1) TABLET BY MOUTH ONCE A DAY 30 tablet 10  . triamcinolone cream (KENALOG) 0.1 % Apply 1 application topically 2 (two) times daily. 30 g 0   No current facility-administered medications on file prior to visit.     ROS see history of present illness  Objective  Physical Exam Vitals:   06/09/21 0942  BP: 130/70  Pulse: 97  Temp: 98.2 F (36.8 C)  SpO2: 98%  BP Readings from Last 3 Encounters:  06/09/21 130/70  01/05/21 130/64  12/07/20 125/70   Wt Readings from Last 3 Encounters:  06/09/21 117 lb 6.4 oz (53.3 kg)  01/05/21 120 lb (54.4 kg)  12/07/20 119 lb 9.6 oz (54.3 kg)    Physical Exam Constitutional:      General: She is not in acute distress.    Appearance: She is not diaphoretic.  Cardiovascular:     Rate and Rhythm: Normal rate and regular rhythm.     Heart sounds: Normal heart sounds.  Pulmonary:     Effort: Pulmonary effort is normal.     Breath sounds: Normal breath sounds.  Musculoskeletal:     Right lower leg: No edema.     Left lower leg: No edema.  Skin:    General: Skin is  warm and dry.  Neurological:     Mental Status: She is alert.     Assessment/Plan: Please see individual problem list.  Problem List Items Addressed This Visit     Benign essential HTN    Stable.  Adequate for age.  She will continue benazepril 5 mg once daily.  Check BMP.      Relevant Orders   Basic Metabolic Panel (BMET)   Hyperlipidemia    Most recent LDL well controlled.  She will continue Crestor 40 mg once daily.      Memory difficulty    Generally stable.  They report no ADL issues.  They will continue to monitor.  I will review her chart from January 2021 to see about writing a letter for their appeal of her long-term care insurance.  Discussed that I was not sure that this would be terribly beneficial as she does not meet criteria for severe cognitive impairment as outlined in the paperwork that he brought in.      Type II diabetes mellitus with renal manifestations (HCC)    Generally stable.  We will check an A1c.  She will continue NovoLog 75/25 14 units twice daily.      Relevant Orders   HgB A1c     Return in about 6 months (around 12/08/2021).  This visit occurred during the SARS-CoV-2 public health emergency.  Safety protocols were in place, including screening questions prior to the visit, additional usage of staff PPE, and extensive cleaning of exam room while observing appropriate contact time as indicated for disinfecting solutions.    Tommi Rumps, MD [place]

## 2021-06-09 NOTE — Assessment & Plan Note (Signed)
Generally stable.  We will check an A1c.  She will continue NovoLog 75/25 14 units twice daily.

## 2021-06-09 NOTE — Assessment & Plan Note (Addendum)
Generally stable.  They report no ADL issues.  They will continue to monitor.  I will review her chart from January 2021 to see about writing a letter for their appeal of her long-term care insurance.  Discussed that I was not sure that this would be terribly beneficial as she does not meet criteria for severe cognitive impairment as outlined in the paperwork that he brought in.

## 2021-06-19 ENCOUNTER — Telehealth: Payer: Self-pay | Admitting: Family Medicine

## 2021-06-19 NOTE — Telephone Encounter (Signed)
Called to speak to [facility]. Laurie Harris states that she was diagnosed with covid on 06/15/21. Pt states that she has a cough and runny nose. Called to offer a mychart urgent care visit as she would need to be evaluated in order to receive medication. Pt states that she just woke up and that she does not know how to work [employer]. Gave a request to contact her daughter, Laurie Harris. Laurie Harris is on the DPR, called to speak with Laurie Harris. Laurie Harris states that she does not have access to her daughter as she is in a home at [facility] [employer]. Laurie Harris states that she did not call, the Glass blower/designer at [facility] called. Her name is Laurie Harris and the number is [phone]. Laurie Harris asks for [name] to call Laurie Harris to offer the mychart virtual visit and let the patient be seen.

## 2021-06-19 NOTE — Telephone Encounter (Signed)
Please call [name] back at [phone]

## 2021-06-19 NOTE — Telephone Encounter (Signed)
Left Message for Laurie Harris to return call to office.

## 2021-06-19 NOTE — Telephone Encounter (Signed)
Noted. Agree with need for visit.

## 2021-06-19 NOTE — Telephone Encounter (Signed)
Patient tested positive for Covid on 06/15/21. [name] does not think anyone called to see if the patient could have any medication to help with her symptoms. She is has a cough, runny nose ,and little congestion.

## 2021-06-19 NOTE — Telephone Encounter (Signed)
Left message for Laurie Harris to return call to Office.

## 2021-06-19 NOTE — Telephone Encounter (Signed)
Able to reach [name] and set up virtual for 05/21/21 with NP, patient COVID positive since 06/16/21 symptom onset 06/15/21, patient having general malaise and coughing, chills, cough, speaking in full sentences without stopping or coughing during call. Patient DPR aware and patient aware and advised worsening symptoms needs to be evaluated in UC or ED.

## 2021-06-20 ENCOUNTER — Encounter: Payer: Self-pay | Admitting: Family

## 2021-06-20 ENCOUNTER — Telehealth (INDEPENDENT_AMBULATORY_CARE_PROVIDER_SITE_OTHER): Payer: Medicare Other | Admitting: Family

## 2021-06-20 VITALS — Ht 65.98 in | Wt 118.0 lb

## 2021-06-20 DIAGNOSIS — R051 Acute cough: Secondary | ICD-10-CM | POA: Diagnosis not present

## 2021-06-20 DIAGNOSIS — I700 Atherosclerosis of aorta: Secondary | ICD-10-CM | POA: Diagnosis not present

## 2021-06-20 DIAGNOSIS — I708 Atherosclerosis of other arteries: Secondary | ICD-10-CM

## 2021-06-20 DIAGNOSIS — U071 COVID-19: Secondary | ICD-10-CM

## 2021-06-20 NOTE — Progress Notes (Signed)
Virtual Visit via Telephone Note  I connected with Laurie Harris on 06/20/21 at  1:45 PM EDT by telephone and verified that I am speaking with the correct person using two identifiers.  Location: Patient: Laurie Harris  Provider: Dutch Quint, NP   I discussed the limitations, risks, security and privacy concerns of performing an evaluation and management service by telephone and the availability of in person appointments. I also discussed with the patient that there may be a patient responsible charge related to this service. The patient expressed understanding and agreed to proceed. Vaccinated and boosted. She lives in an assisted living facility.    History of Present Illness: 80 year old female presents after testing positive for COVID-19 on 10/13 by rapid antigen test. Has a cough that has improved. No fever. Appetite has been decreased but admits to increasing her eater intake. Is in an assisted living facility.     Observations/Objective: NAD, breathing well, speaking without difficult   Assessment and Plan: Laurie Harris was seen today for covid positive.  Diagnoses and all orders for this visit:  COVID-19  Acute cough     Follow Up Instructions: Patient stable. Call the office if symptoms worsen or persist. Recheck as scheduled and sooner as needed.     I discussed the assessment and treatment plan with the patient. The patient was provided an opportunity to ask questions and all were answered. The patient agreed with the plan and demonstrated an understanding of the instructions.   The patient was advised to call back or seek an in-person evaluation if the symptoms worsen or if the condition fails to improve as anticipated.  I provided 15 minutes of non-face-to-face time during this encounter.   Kennyth Arnold, FNP;

## 2021-06-22 ENCOUNTER — Other Ambulatory Visit: Payer: Self-pay | Admitting: Family Medicine

## 2021-07-10 ENCOUNTER — Encounter: Payer: Self-pay | Admitting: Family Medicine

## 2021-07-11 NOTE — Telephone Encounter (Signed)
Pt's daughter has sent a message voicing concerns about Gerber's mental decline. Sula Soda states that Ciarrah has recently shown forgetfulness and issues with memory and Sula Soda would like to follow up on the [place] previously spoken of. Pt next appointment is 12/15/2021 and has a wellness check on 07/13/2022. Would you like her scheduled for a sooner appointment?

## 2021-07-11 NOTE — Telephone Encounter (Signed)
Message reviewed. I certainly agree that the reported memory concerns in the message are concerning. Can we see if the patient can come in for a different memory test to see if I can capture this memory issue in a better manner? Can she come in on 07/17/21 at 4:15? I reviewed the papers the patients daughter brought in previously and I am not sure that the patient will meet the criteria for this based on what the paperwork states. We can reevaluate with a different memory test to see if that will make a difference.

## 2021-07-12 NOTE — Telephone Encounter (Signed)
How about 11:45 am on 11/14?

## 2021-07-13 ENCOUNTER — Ambulatory Visit (INDEPENDENT_AMBULATORY_CARE_PROVIDER_SITE_OTHER): Payer: Medicare Other

## 2021-07-13 VITALS — Ht 65.98 in | Wt 118.0 lb

## 2021-07-13 DIAGNOSIS — Z0000 Encounter for general adult medical examination without abnormal findings: Secondary | ICD-10-CM | POA: Diagnosis not present

## 2021-07-13 NOTE — Progress Notes (Signed)
Subjective:   Laurie Harris is a 80 y.o. female who presents for Medicare Annual (Subsequent) preventive examination.  Review of Systems    No ROS.  Medicare Wellness Virtual Visit.  Visual/audio telehealth visit, UTA vital signs.   See social history for additional risk factors.   Cardiac Risk Factors include: advanced age (>36men, >65 women);diabetes mellitus;hypertension     Objective:    Today's Vitals   07/13/21 1534  Weight: 118 lb (53.5 kg)  Height: 5' 5.98" (1.676 m)   Body mass index is 19.06 kg/m.  Advanced Directives 07/13/2021 07/12/2020 12/08/2019 10/20/2019 09/25/2019 09/18/2019 09/17/2019  Does Patient Have a Medical Advance Directive? Yes Yes No Yes Yes Yes Yes  Type of Paramedic of [place];Living will Living will - Living will [place];Living will Living will [place];Living will  Does patient want to make changes to medical advance directive? No - Patient declined No - Patient declined - - No - Guardian declined No - Patient declined -  Copy of [place] in Chart? No - copy requested - - - - - -  Would patient like information on creating a medical advance directive? - - No - Patient declined No - Patient declined - - -    Current Medications (verified) Outpatient Encounter Medications as of 07/13/2021  Medication Sig   aspirin EC 81 MG tablet Take 81 mg by mouth daily.   BD AUTOSHIELD DUO 30G X 5 MM MISC USE TWICE DAILY AS DIRECTED   benazepril (LOTENSIN) 5 MG tablet TAKE ONE TABLET EVERY DAY   calcium carbonate (OS-CAL) 600 MG TABS tablet Take one tablet by mouth once a day.   Cholecalciferol (VITAMIN D3) 50 MCG (2000 UT) capsule Take one tablet by mouth daily.    docusate sodium (COLACE) 100 MG capsule Take 1 capsule (100 mg total) by mouth 2 (two) times daily as needed for mild constipation.   fenofibrate 160 MG tablet Take 1 tablet (160 mg total) by mouth daily.   HUMALOG  MIX 75/25 KWIKPEN (75-25) 100 UNIT/ML KwikPen INJECT 14 UNITS SUBCUTANEOUSLY TWICE A DAY WITH A MEAL.   insulin lispro protamine-lispro (HUMALOG 75/25 MIX) (75-25) 100 UNIT/ML SUSP injection Inject 14 Units into the skin 2 (two) times daily with a meal.   Multiple Vitamin (MULTIVITAMIN WITH MINERALS) TABS tablet Take 1 tablet by mouth daily.   NOVOFINE PLUS 32G X 4 MM MISC USE TWICE A DAY AS DIRECTED   pantoprazole (PROTONIX) 40 MG tablet TAKE 1 TABLET BY MOUTH EVERY-OTHER-DAY   rosuvastatin (CRESTOR) 40 MG tablet TAKE (1) TABLET BY MOUTH ONCE A DAY   triamcinolone cream (KENALOG) 0.1 % Apply 1 application topically 2 (two) times daily.   No facility-administered encounter medications on file as of 07/13/2021.    Allergies (verified) Warfarin and related   History: Past Medical History:  Diagnosis Date   Carotid arterial disease ([place])    a. 09/2016 Carotid U/S: R carotid bifurcation dzs of 50-69%, L carotid bifurcation dzs of <50%. Patent vertebral arteries w/ anegrade flow.   Cerebellar hemorrhage ([place]) 03/16/2015   Left Cerebellar Hemorrhage- See Care Everywhere Seen by [facility]     Chicken pox    CKD (chronic kidney disease), stage III (HCC)    functioning at 46%   Diabetes mellitus without complication ([place])    Hemorrhagic stroke ([place]) 12/31/2014   a. in the setting of warfarin therapy.   History of appendicitis 08/07/2016  Hyperlipidemia    Hypertension    Syncope    a. 09/2016 Echo: EF 55-60%, no rwma, Gr1 DD.   Past Surgical History:  Procedure Laterality Date   ABCESS DRAINAGE  06/14/2016   appendix   APPENDECTOMY  06/13/2016   CATARACT EXTRACTION W/PHACO Left 09/04/2017   Procedure: CATARACT EXTRACTION PHACO AND INTRAOCULAR LENS PLACEMENT ([place]) COMPLICATED LEFT DIABETIC;  Surgeon: Leandrew Koyanagi, MD;  Location: [facility];  Service: Ophthalmology;  Laterality: Left;  Diabetic - insulin   CATARACT EXTRACTION W/PHACO Right 10/02/2017   Procedure:  CATARACT EXTRACTION PHACO AND INTRAOCULAR LENS PLACEMENT ([place]) COMPLICATED  RIGHT DIABETIC;  Surgeon: Leandrew Koyanagi, MD;  Location: [facility];  Service: Ophthalmology;  Laterality: Right;  Diabetic - insulin   CHOLECYSTECTOMY N/A 06/17/2018   Procedure: LAPAROSCOPIC CHOLECYSTECTOMY;  Surgeon: Benjamine Sprague, DO;  Location: [facility];  Service: General;  Laterality: N/A;   COLONOSCOPY     NECK SURGERY     Family History  Problem Relation Age of Onset   Diabetes Mother    Dementia Mother    Heart disease Mother    Cancer Mother        lung   Cancer Father        colon   Stroke Daughter    Hyperlipidemia Daughter    Breast cancer Neg Hx    Social History   Socioeconomic History   Marital status: Widowed    Spouse name: Not on file   Number of children: Not on file   Years of education: Not on file   Highest education level: Not on file  Occupational History   Not on file  Tobacco Use   Smoking status: Never   Smokeless tobacco: Never  Vaping Use   Vaping Use: Never used  Substance and Sexual Activity   Alcohol use: No    Alcohol/week: 0.0 standard drinks   Drug use: No   Sexual activity: Not on file  Other Topics Concern   Not on file  Social History Narrative   Not on file   Social Determinants of Health   Financial Resource Strain: Low Risk    Difficulty of Paying Living Expenses: Not hard at all  Food Insecurity: No Food Insecurity   Worried About Charity fundraiser in the Last Year: Never true   [place] in the Last Year: Never true  Transportation Needs: No Transportation Needs   Lack of Transportation (Medical): No   Lack of Transportation (Non-Medical): No  Physical Activity: Insufficiently Active   Days of Exercise per Week: 6 days   Minutes of Exercise per Session: 20 min  Stress: No Stress Concern Present   Feeling of Stress : Not at all  Social Connections: Moderately Integrated   Frequency of Communication with Friends and  Family: More than three times a week   Frequency of Social Gatherings with Friends and Family: More than three times a week   Attends Religious Services: 1 to 4 times per year   Active Member of [employer] or Organizations: Yes   Attends Archivist Meetings: Never   Marital Status: Widowed    Tobacco Counseling Counseling given: Not Answered   Clinical Intake:  Pre-visit preparation completed: Yes    Nutrition Risk Assessment: Has the patient had any N/V/D within the last 2 months?  No  Does the patient have any non-healing wounds?  No  Has the patient had any unintentional weight loss or weight gain?  No  Diabetes Management: Are you having any financial strains with the device, your supplies or your medication? No .  Does the patient want to be seen by Chronic Care Management for management of their diabetes?  No  Would the patient like to be referred to a Nutritionist or for Diabetic Management?  No    Diabetes: Yes (Followed by pcp)  How often do you need to have someone help you when you read instructions, pamphlets, or other written materials from your doctor or pharmacy?: 2 - Rarely    Interpreter Needed?: No    Activities of Daily Living In your present state of health, do you have any difficulty performing the following activities: 07/13/2021  Hearing? N  Vision? N  Difficulty concentrating or making decisions? N  Walking or climbing stairs? Y  Comment Unsteady gait. Walker in use.  Dressing or bathing? N  Doing errands, shopping? Y  Comment Patient does not Physiological scientist and eating ? N  Comment Staff assist. Self feeds.  Using the Toilet? N  In the past six months, have you accidently leaked urine? N  Do you have problems with loss of bowel control? N  Managing your Medications? Y  Comment Staff at [employer]? Y  Comment Daughter assist  Housekeeping or managing your Housekeeping? Y  Comment Maid assist  Some  recent data might be hidden    Patient Care Team: [name], MD as PCP - General (Family Medicine) Dionisio David, MD (Cardiology)  Indicate any recent Medical Services you may have received from other than [facility] providers in the past year (date may be approximate).     Assessment:   This is a routine wellness examination for Iyani.  Virtual Visit via Telephone Note  I connected with  Malissa Hippo on 07/13/21 at  3:30 PM EST by telephone and verified that I am speaking with the correct person using two identifiers.  Location: Patient: home Provider: office Persons participating in the virtual visit: patient/Nurse Health Advisor   I discussed the limitations, risks, security and privacy concerns of performing an evaluation and management service by telephone and the availability of in person appointments. The patient expressed understanding and agreed to proceed.  Interactive audio and video telecommunications were attempted between this nurse and patient, however failed, due to patient having technical difficulties OR patient did not have access to video capability.  We continued and completed visit with audio only.  Some vital signs may be absent or patient reported.   Hearing/Vision screen Hearing Screening - Comments:: Patient is able to hear conversational tones without difficulty. No issues reported. Vision Screening - Comments:: Wears corrective lenses They have seen their ophthalmologist in the last 12 months.   Dietary issues and exercise activities discussed: Current Exercise Habits: Home exercise routine, Type of exercise: walking, Time (Minutes): 20, Frequency (Times/Week): 6, Weekly Exercise (Minutes/Week): 120, Intensity: Mild  Regular diet; monitoring sugar intake Good water intake   Goals Addressed             This Visit's Progress    Follow up with Primary Care Provider       As needed       Depression Screen [facility] 2/9 Scores 07/13/2021  06/20/2021 06/09/2021 12/07/2020 07/12/2020 07/05/2020 11/25/2019  PHQ - 2 Score 0 0 0 0 0 0 0  Exception Documentation - - - - - - -    Fall Risk Fall Risk  07/13/2021 06/20/2021 12/07/2020 07/12/2020  07/05/2020  Falls in the past year? 0 0 0 0 0  Number falls in past yr: 0 0 0 0 0  Injury with Fall? - 0 - - -  Risk Factor Category  - - - - -  Risk for fall due to : - - - - -  Follow up Falls evaluation completed Falls evaluation completed Falls evaluation completed Falls evaluation completed Falls evaluation completed    [place]: Home free of loose throw rugs in walkways, pet beds, electrical cords, etc? Yes  Adequate lighting in your home to reduce risk of falls? Yes   ASSISTIVE DEVICES UTILIZED TO PREVENT FALLS: Life alert? Yes  Use of a cane, walker or w/c? Yes , walker Grab bars in the bathroom? Yes  Shower chair or bench in shower? Yes  Elevated toilet seat or a handicapped toilet? Yes   TIMED UP AND GO: Was the test performed? No . Telehealth visit.   Cognitive Function: MMSE - Mini Mental State Exam 06/09/2021 07/01/2017  Orientation to time 5 5  Orientation to Place 4 5  Registration 3 3  Attention/ Calculation 5 5  Recall 2 3  Language- name 2 objects 2 2  Language- repeat 1 1  Language- follow 3 step command 3 3  Language- read & follow direction 1 1  Write a sentence 1 1  Copy design 1 1  Total score 28 30     6CIT Screen 07/13/2021 07/12/2020 07/10/2019 07/02/2018  What Year? 0 points 0 points 0 points 0 points  What month? 0 points 0 points 0 points 0 points  What time? 0 points 0 points 0 points 0 points  Count back from 20 0 points - 0 points 0 points  Months in reverse 0 points 0 points 0 points 0 points  Repeat phrase 2 points 2 points - 0 points  Total Score 2 - - 0    Immunizations Immunization History  Administered Date(s) Administered   PFIZER(Purple Top)SARS-COV-2 Vaccination 09/28/2019, 10/19/2019, 06/22/2020,  02/22/2021   PPD Test 10/14/2019   Tdap 08/23/2019   Pneumococcal vaccine status: Due, Education has been provided regarding the importance of this vaccine. Advised may receive this vaccine at local pharmacy or Health Dept. Aware to provide a copy of the vaccination record if obtained from local pharmacy or Health Dept. Verbalized acceptance and understanding. Deferred.   Shingrix Completed?: No.    Education has been provided regarding the importance of this vaccine. Patient has been advised to call insurance company to determine out of pocket expense if they have not yet received this vaccine. Advised may also receive vaccine at local pharmacy or Health Dept. Verbalized acceptance and understanding.  Screening Tests Health Maintenance  Topic Date Due   FOOT EXAM  07/05/2021   COVID-19 Vaccine (5 - Booster for Pfizer series) 07/29/2021 (Originally 04/19/2021)   Zoster Vaccines- Shingrix (1 of 2) 10/13/2021 (Originally 10/14/1959)   Pneumonia Vaccine 23+ Years old (1 - PCV) 07/13/2022 (Originally 10/14/1946)   COLONOSCOPY (Pts 45-46yrs Insurance coverage will need to be confirmed)  07/13/2022 (Originally 07/20/2019)   OPHTHALMOLOGY EXAM  07/22/2021   HEMOGLOBIN A1C  12/08/2021   TETANUS/TDAP  08/22/2029   DEXA SCAN  Completed   HPV VACCINES  Aged Out   Health Maintenance Health Maintenance Due  Topic Date Due   FOOT EXAM  07/05/2021   Colorectal cancer screening: No longer required.   Lung Cancer Screening: (Low Dose CT Chest recommended if Age  55-80 years, 30 pack-year currently smoking OR have quit w/in 15years.) does not qualify.   Hepatitis C Screening: does not qualify.  Vision Screening: Recommended annual ophthalmology exams for early detection of glaucoma and other disorders of the eye.  Dental Screening: Recommended annual dental exams for proper oral hygiene.  Community Resource Referral / Chronic Care Management: CRR required this visit?  No   CCM required this visit?   No      Plan:   Keep all routine maintenance appointments.   I have personally reviewed and noted the following in the patient's chart:   Medical and social history Use of alcohol, tobacco or illicit drugs  Current medications and supplements including opioid prescriptions. Not taking opioid.  Functional ability and status Nutritional status Physical activity Advanced directives List of other physicians Hospitalizations, surgeries, and ER visits in previous 12 months Vitals Screenings to include cognitive, depression, and falls Referrals and appointments  In addition, I have reviewed and discussed with patient certain preventive protocols, quality metrics, and best practice recommendations. A written personalized care plan for preventive services as well as general preventive health recommendations were provided to patient.     Varney Biles, LPN   11/01/3141

## 2021-07-13 NOTE — Patient Instructions (Addendum)
Laurie Harris , Thank you for taking time to come for your Medicare Wellness Visit. I appreciate your ongoing commitment to your health goals. Please review the following plan we discussed and let me know if I can assist you in the future.   These are the goals we discussed:  Goals      Follow up with Primary Care Provider     As needed        This is a list of the screening recommended for you and due dates:  Health Maintenance  Topic Date Due   Complete foot exam   07/05/2021   COVID-19 Vaccine (5 - Booster for Pfizer series) 07/29/2021*   Zoster (Shingles) Vaccine (1 of 2) 10/13/2021*   Pneumonia Vaccine (1 - PCV) 07/13/2022*   Colon Cancer Screening  07/13/2022*   Eye exam for diabetics  07/22/2021   Hemoglobin A1C  12/08/2021   Tetanus Vaccine  08/22/2029   DEXA scan (bone density measurement)  Completed   HPV Vaccine  Aged Out  *Topic was postponed. The date shown is not the original due date.    Advanced directives: End of life planning; Advance aging; Advanced directives discussed.  Copy of current HCPOA/Living Will requested.    Conditions/risks identified: none new  Follow up in one year for your annual wellness visit    Preventive Care 65 Years and Older, Female Preventive care refers to lifestyle choices and visits with your health care provider that can promote health and wellness. What does preventive care include? A yearly physical exam. This is also called an annual well check. Dental exams once or twice a year. Routine eye exams. Ask your health care provider how often you should have your eyes checked. Personal lifestyle choices, including: Daily care of your teeth and gums. Regular physical activity. Eating a healthy diet. Avoiding tobacco and drug use. Limiting alcohol use. Practicing safe sex. Taking low-dose aspirin every day. Taking vitamin and mineral supplements as recommended by your health care provider. What happens during an annual well  check? The services and screenings done by your health care provider during your annual well check will depend on your age, overall health, lifestyle risk factors, and family history of disease. Counseling  Your health care provider may ask you questions about your: Alcohol use. Tobacco use. Drug use. Emotional well-being. Home and relationship well-being. Sexual activity. Eating habits. History of falls. Memory and ability to understand (cognition). Work and work Statistician. Reproductive health. Screening  You may have the following tests or measurements: Height, weight, and BMI. Blood pressure. Lipid and cholesterol levels. These may be checked every 5 years, or more frequently if you are over 82 years old. Skin check. Lung cancer screening. You may have this screening every year starting at age 82 if you have a 30-pack-year history of smoking and currently smoke or have quit within the past 15 years. Fecal occult blood test (FOBT) of the stool. You may have this test every year starting at age 82. Flexible sigmoidoscopy or colonoscopy. You may have a sigmoidoscopy every 5 years or a colonoscopy every 10 years starting at age 82. Hepatitis C blood test. Hepatitis B blood test. Sexually transmitted disease (STD) testing. Diabetes screening. This is done by checking your blood sugar (glucose) after you have not eaten for a while (fasting). You may have this done every 1-3 years. Bone density scan. This is done to screen for osteoporosis. You may have this done starting at age 82. Mammogram. This may  be done every 1-2 years. Talk to your health care provider about how often you should have regular mammograms. Talk with your health care provider about your test results, treatment options, and if necessary, the need for more tests. Vaccines  Your health care provider may recommend certain vaccines, such as: Influenza vaccine. This is recommended every year. Tetanus, diphtheria, and  acellular pertussis (Tdap, Td) vaccine. You may need a Td booster every 10 years. Zoster vaccine. You may need this after age 82. Pneumococcal 13-valent conjugate (PCV13) vaccine. One dose is recommended after age 82. Pneumococcal polysaccharide (PPSV23) vaccine. One dose is recommended after age 82. Talk to your health care provider about which screenings and vaccines you need and how often you need them. This information is not intended to replace advice given to you by your health care provider. Make sure you discuss any questions you have with your health care provider. Document Released: 09/16/2015 Document Revised: 05/09/2016 Document Reviewed: 06/21/2015 Elsevier Interactive Patient Education  2017 [place] Prevention in the Home Falls can cause injuries. They can happen to people of all ages. There are many things you can do to make your home safe and to help prevent falls. What can I do on the outside of my home? Regularly fix the edges of walkways and driveways and fix any cracks. Remove anything that might make you trip as you walk through a door, such as a raised step or threshold. Trim any bushes or trees on the path to your home. Use bright outdoor lighting. Clear any walking paths of anything that might make someone trip, such as rocks or tools. Regularly check to see if handrails are loose or broken. Make sure that both sides of any steps have handrails. Any raised decks and porches should have guardrails on the edges. Have any leaves, snow, or ice cleared regularly. Use sand or salt on walking paths during winter. Clean up any spills in your garage right away. This includes oil or grease spills. What can I do in the bathroom? Use night lights. Install grab bars by the toilet and in the tub and shower. Do not use towel bars as grab bars. Use non-skid mats or decals in the tub or shower. If you need to sit down in the shower, use a plastic, non-slip stool. Keep  the floor dry. Clean up any water that spills on the floor as soon as it happens. Remove soap buildup in the tub or shower regularly. Attach bath mats securely with double-sided non-slip rug tape. Do not have throw rugs and other things on the floor that can make you trip. What can I do in the bedroom? Use night lights. Make sure that you have a light by your bed that is easy to reach. Do not use any sheets or blankets that are too big for your bed. They should not hang down onto the floor. Have a firm chair that has side arms. You can use this for support while you get dressed. Do not have throw rugs and other things on the floor that can make you trip. What can I do in the kitchen? Clean up any spills right away. Avoid walking on wet floors. Keep items that you use a lot in easy-to-reach places. If you need to reach something above you, use a strong step stool that has a grab bar. Keep electrical cords out of the way. Do not use floor polish or wax that makes floors slippery. If you must use  wax, use non-skid floor wax. Do not have throw rugs and other things on the floor that can make you trip. What can I do with my stairs? Do not leave any items on the stairs. Make sure that there are handrails on both sides of the stairs and use them. Fix handrails that are broken or loose. Make sure that handrails are as long as the stairways. Check any carpeting to make sure that it is firmly attached to the stairs. Fix any carpet that is loose or worn. Avoid having throw rugs at the top or bottom of the stairs. If you do have throw rugs, attach them to the floor with carpet tape. Make sure that you have a light switch at the top of the stairs and the bottom of the stairs. If you do not have them, ask someone to add them for you. What else can I do to help prevent falls? Wear shoes that: Do not have high heels. Have rubber bottoms. Are comfortable and fit you well. Are closed at the toe. Do not  wear sandals. If you use a stepladder: Make sure that it is fully opened. Do not climb a closed stepladder. Make sure that both sides of the stepladder are locked into place. Ask someone to hold it for you, if possible. Clearly mark and make sure that you can see: Any grab bars or handrails. First and last steps. Where the edge of each step is. Use tools that help you move around (mobility aids) if they are needed. These include: Canes. Walkers. Scooters. Crutches. Turn on the lights when you go into a dark area. Replace any light bulbs as soon as they burn out. Set up your furniture so you have a clear path. Avoid moving your furniture around. If any of your floors are uneven, fix them. If there are any pets around you, be aware of where they are. Review your medicines with your doctor. Some medicines can make you feel dizzy. This can increase your chance of falling. Ask your doctor what other things that you can do to help prevent falls. This information is not intended to replace advice given to you by your health care provider. Make sure you discuss any questions you have with your health care provider. Document Released: 06/16/2009 Document Revised: 01/26/2016 Document Reviewed: 09/24/2014 Elsevier Interactive Patient Education  2017 [employer].

## 2021-07-13 NOTE — Telephone Encounter (Signed)
Called to speak with Laurie Harris to schedule Laurie Harris for an appointment on 11/28 at 10:15am per Dr. Tharon Aquas orders. Laurie Harris did not answer, could not leave a message to call back.

## 2021-07-13 NOTE — Telephone Encounter (Signed)
She could be scheduled at 10:15 on 11/28. She is being worked in to the schedule so depending on how the morning goes before her appointment she may have a slight wait to be seen.

## 2021-07-18 NOTE — Telephone Encounter (Signed)
Patient has been scheduled

## 2021-07-20 IMAGING — MR MR HEAD W/O CM
9 of 10 series · 41 of 48 positions shown · non-contrast
Comparison: Prior head CT from earlier the same day.

CLINICAL DATA: Initial evaluation for acute ataxia, dizziness.

EXAM:
MRI HEAD WITHOUT CONTRAST
TECHNIQUE: Multiplanar, multiecho pulse sequences of the brain and surrounding
structures were obtained without intravenous contrast.

[Series 2: ax dwi_tracew · axial · 3.0mm · 0.71mm/px · z∈[-93,+70]mm · 8 of 56 slices shown]
[im 1/56]
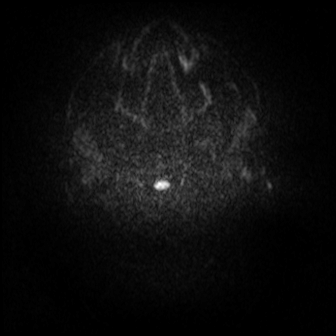
[im 8/56]
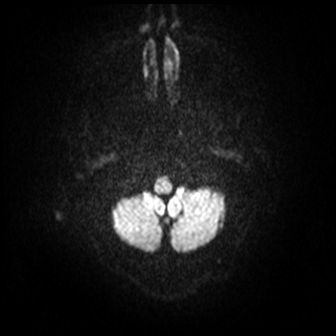
[im 16/56]
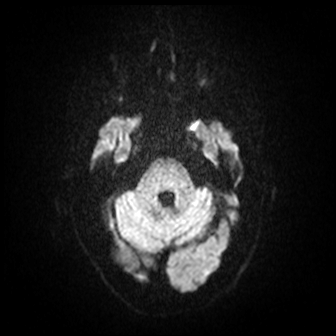
[im 24/56]
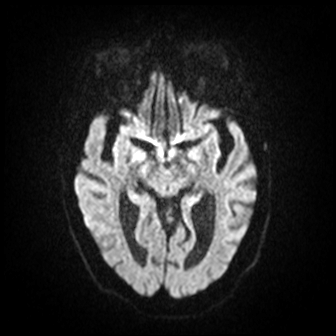
[im 32/56]
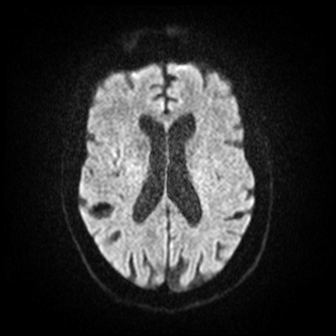
[im 40/56]
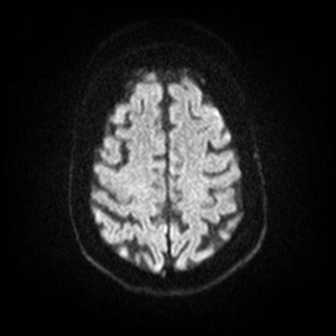
[im 48/56]
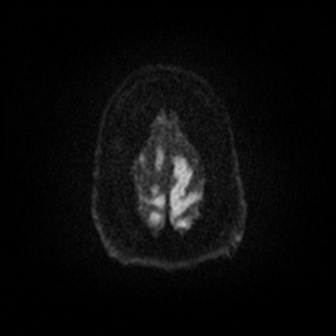
[im 56/56]
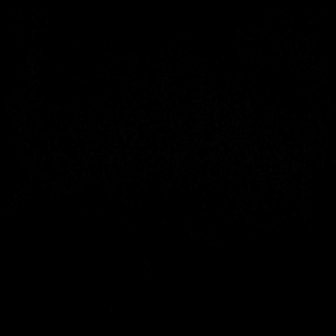

[Series 3: ax dwi_adc · axial · 3.0mm · 0.71mm/px · z∈[-93,+67]mm · 7 of 55 slices shown]
[im 1/55]
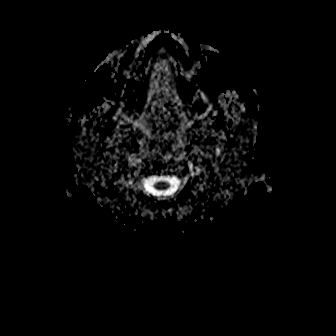
[im 10/55]
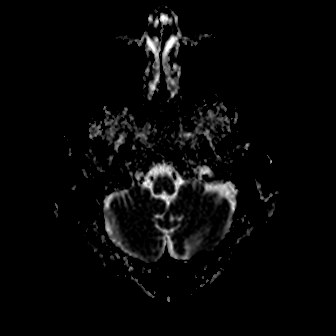
[im 19/55]
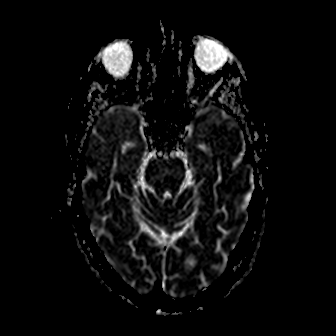
[im 28/55]
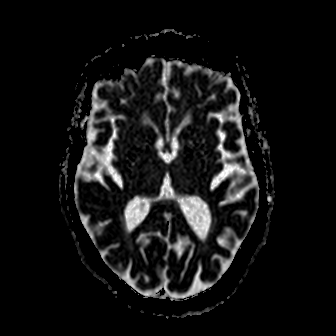
[im 37/55]
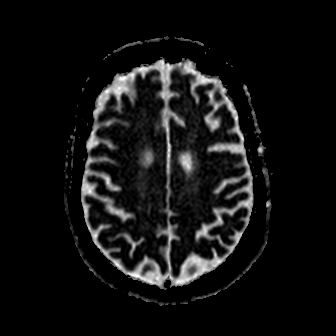
[im 46/55]
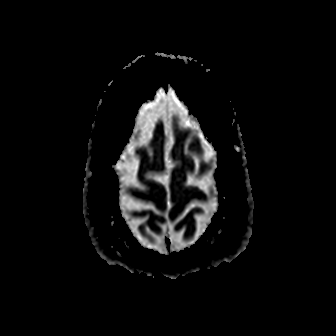
[im 55/55]
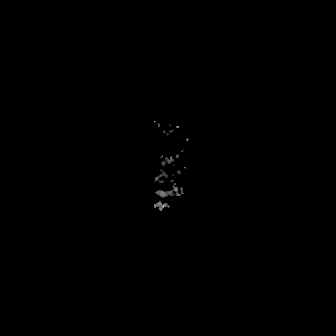

[Series 4: cor dwi_tracew · coronal · 5.0mm · 0.68mm/px · 5 of 40 slices shown]
[im 1/40]
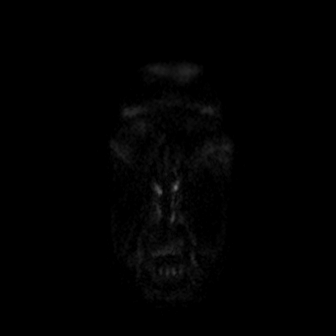
[im 10/40]
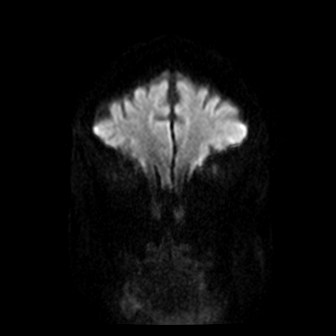
[im 20/40]
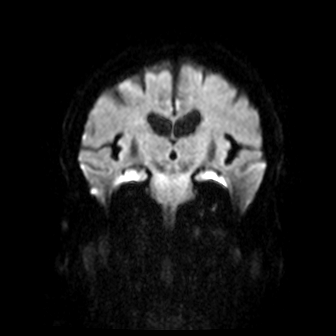
[im 30/40]
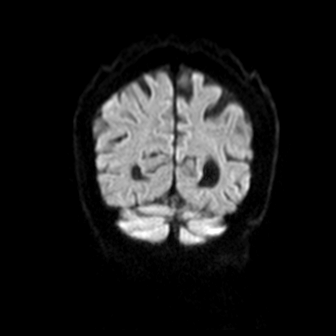
[im 40/40]
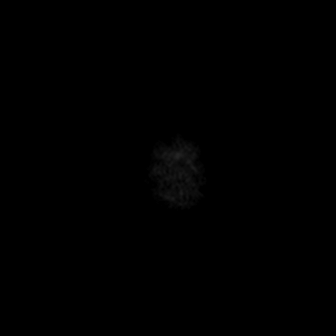

[Series 5: cor dwi_adc · coronal · 5.0mm · 0.68mm/px · 2 of 40 slices shown]
[im 1/40]
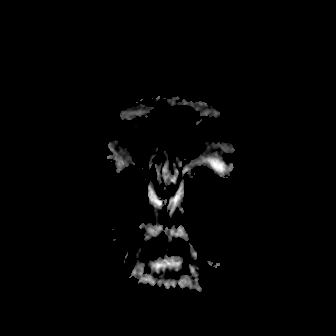
[im 10/40]
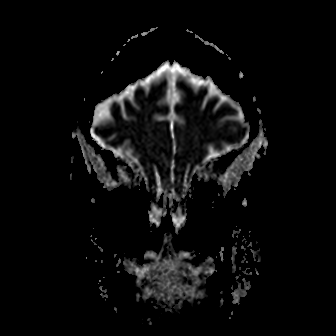

[Series 6: T1 · sagittal · 5.0mm · 0.94mm/px · 3 of 23 slices shown (1 of 2)]
[im 1/23]
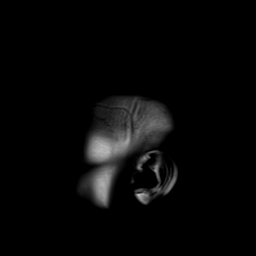
[im 12/23]
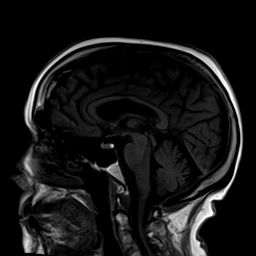
[im 23/23]
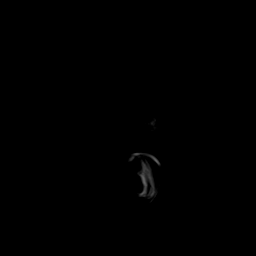

[Series 7: T2 · axial · 5.0mm · 0.45mm/px · z∈[-88,+66]mm · 4 of 27 slices shown (1 of 2)]
[im 1/27]
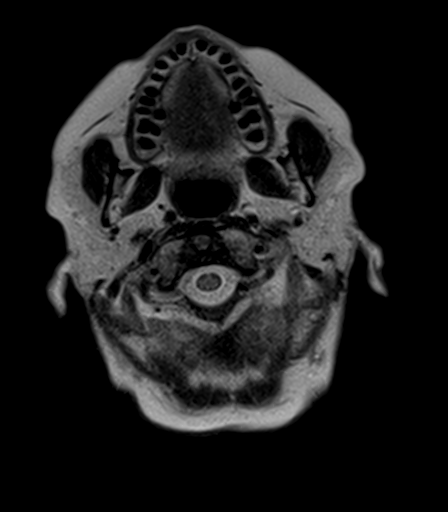
[im 9/27]
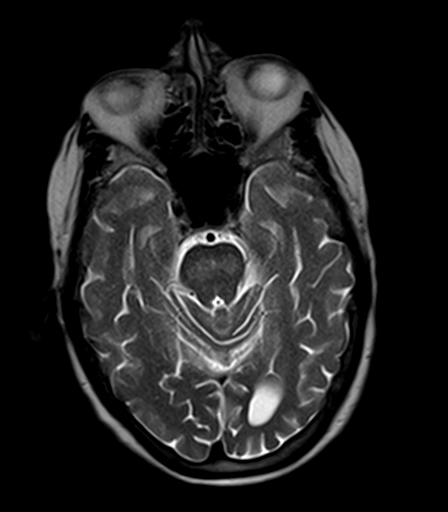
[im 18/27]
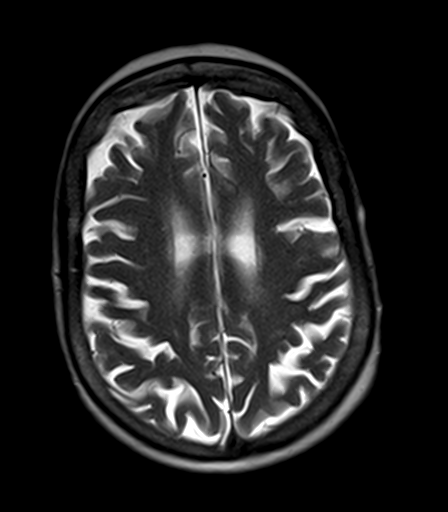
[im 27/27]
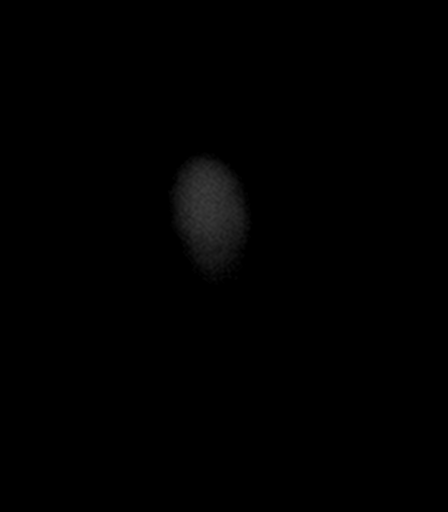

[Series 9: FLAIR · axial · 5.0mm · 1.20mm/px · z∈[-88,+66]mm · 4 of 27 slices shown]
[im 1/27]
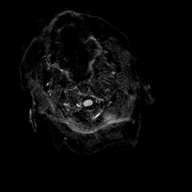
[im 9/27]
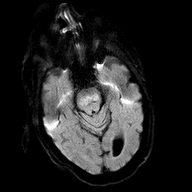
[im 18/27]
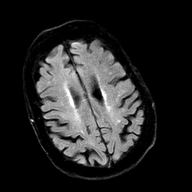
[im 27/27]
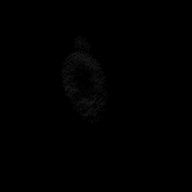

[Series 10: T1 · axial · 5.0mm · 0.90mm/px · z∈[-88,+66]mm · 4 of 27 slices shown (2 of 2)]
[im 1/27]
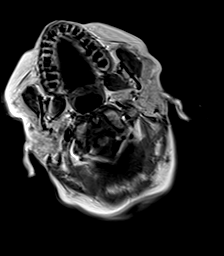
[im 9/27]
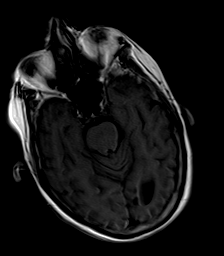
[im 18/27]
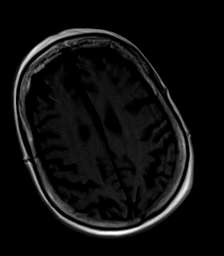
[im 27/27]
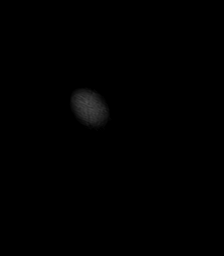

[Series 11: T2 · coronal · 5.0mm · 0.45mm/px · 4 of 31 slices shown (2 of 2)]
[im 1/31]
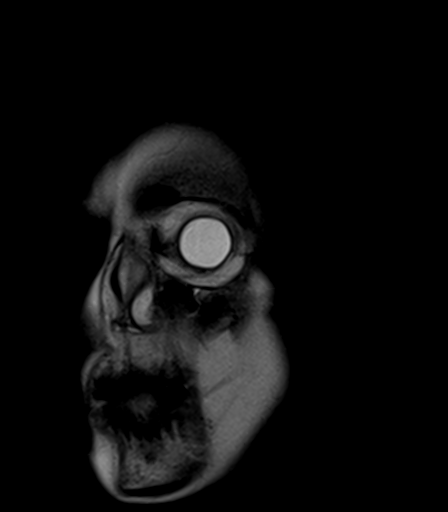
[im 11/31]
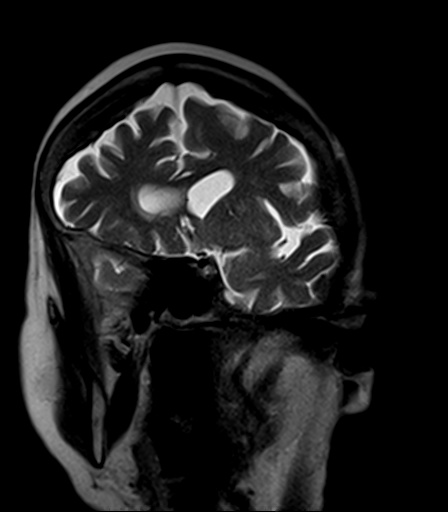
[im 21/31]
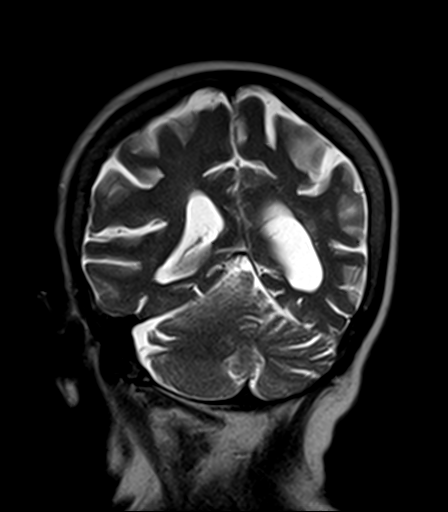
[im 31/31]
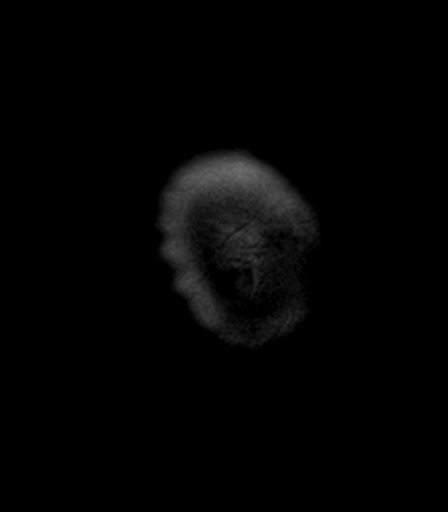

[41 of 48 positions shown; findings below may reference images not displayed]

FINDINGS: Brain: Examination mildly degraded by motion artifact.

Generalized age-related cerebral atrophy. Patchy T2/FLAIR
hyperintensity within the periventricular and deep white matter both
cerebral hemispheres most consistent with chronic small vessel
ischemic disease, mild to moderate in nature. Patchy involvement of
the pons noted.

No abnormal foci of restricted diffusion to suggest acute or
subacute ischemia. Gray-white matter differentiation maintained. No
areas of remote cortical infarction identified. No evidence for
acute intracranial hemorrhage. Single linear focus of susceptibility
artifact noted at the left paramedian cerebellar vermis,
nonspecific, but suggestive of small focus of chronic hemorrhage, of
doubtful significance in the acute setting. No other evidence for
chronic intracranial hemorrhage.

No mass lesion, midline shift or mass effect. No hydrocephalus. No
extra-axial fluid collection. Pituitary gland and suprasellar region
within normal limits. Midline structures intact.

Vascular: Major intracranial vascular flow voids are maintained.

Skull and upper cervical spine: Craniocervical junction within
normal limits. Bone marrow signal intensity normal. No scalp soft
tissue abnormality.

Sinuses/Orbits: Patient status post bilateral ocular lens
replacement. Globes and orbital soft tissues demonstrate no acute
finding. Paranasal sinuses are largely clear. No mastoid effusion.
Inner ear structures normal.

Other: None.
IMPRESSION: 1. No acute intracranial abnormality.
2. Generalized age-related cerebral atrophy with mild-to-moderate
chronic microvascular ischemic disease.

## 2021-07-24 DIAGNOSIS — E113293 Type 2 diabetes mellitus with mild nonproliferative diabetic retinopathy without macular edema, bilateral: Secondary | ICD-10-CM | POA: Diagnosis not present

## 2021-07-24 LAB — HM DIABETES EYE EXAM

## 2021-07-31 ENCOUNTER — Ambulatory Visit: Payer: Medicare Other | Admitting: Family Medicine

## 2021-08-10 DIAGNOSIS — Z20822 Contact with and (suspected) exposure to covid-19: Secondary | ICD-10-CM | POA: Diagnosis not present

## 2021-08-25 ENCOUNTER — Ambulatory Visit (INDEPENDENT_AMBULATORY_CARE_PROVIDER_SITE_OTHER): Payer: Medicare Other | Admitting: Family Medicine

## 2021-08-25 ENCOUNTER — Encounter: Payer: Self-pay | Admitting: Family Medicine

## 2021-08-25 ENCOUNTER — Other Ambulatory Visit: Payer: Self-pay

## 2021-08-25 DIAGNOSIS — R4189 Other symptoms and signs involving cognitive functions and awareness: Secondary | ICD-10-CM

## 2021-08-25 DIAGNOSIS — I700 Atherosclerosis of aorta: Secondary | ICD-10-CM | POA: Diagnosis not present

## 2021-08-25 DIAGNOSIS — F320 Major depressive disorder, single episode, mild: Secondary | ICD-10-CM

## 2021-08-25 DIAGNOSIS — I708 Atherosclerosis of other arteries: Secondary | ICD-10-CM

## 2021-08-25 NOTE — Patient Instructions (Signed)
Nice to see you. Please consider zoloft for your depression as this should be beneficial for depression and improvement in your depression may help with your memory.  Please consider Aricept for your memory.  If you decide to try either of those medications please let me know.

## 2021-08-25 NOTE — Progress Notes (Signed)
Tommi Rumps, MD Phone: [phone]  Laurie Harris is a 80 y.o. female who presents today for follow-up.  Memory difficulty/depression: Patient presents for reevaluation of her memory issues.  Her daughter notes her memory has progressed since our last evaluation.  She has forgotten things that her daughter has told her on several occasions.  She also notes some depression and notes this is related to where she is having to live at this time.  She notes some mild anxiety.  She has been sleeping more than she should per her report.  She notes no SI.  Montreal Cognitive Assessment  08/25/2021  Visuospatial/ Executive (0/5) 3  Naming (0/3) 2  Attention: Read list of digits (0/2) 2  Attention: Read list of letters (0/1) 0  Attention: Serial 7 subtraction starting at 100 (0/3) 0  Language: Repeat phrase (0/2) 2  Language : Fluency (0/1) 0  Abstraction (0/2) 0  Delayed Recall (0/5) 5  Orientation (0/6) 5  Total 19  Adjusted Score (based on education) 20     Social History   Tobacco Use  Smoking Status Never  Smokeless Tobacco Never    Current Outpatient Medications on File Prior to Visit  Medication Sig Dispense Refill   aspirin EC 81 MG tablet Take 81 mg by mouth daily.     BD AUTOSHIELD DUO 30G X 5 MM MISC USE TWICE DAILY AS DIRECTED 100 each 1   benazepril (LOTENSIN) 5 MG tablet TAKE ONE TABLET EVERY DAY 90 tablet 2   calcium carbonate (OS-CAL) 600 MG TABS tablet Take one tablet by mouth once a day. 30 tablet 10   Cholecalciferol (VITAMIN D3) 50 MCG (2000 UT) capsule Take one tablet by mouth daily.  30 capsule 10   docusate sodium (COLACE) 100 MG capsule Take 1 capsule (100 mg total) by mouth 2 (two) times daily as needed for mild constipation. 10 capsule 0   fenofibrate 160 MG tablet Take 1 tablet (160 mg total) by mouth daily. 90 tablet 1   HUMALOG MIX 75/25 KWIKPEN (75-25) 100 UNIT/ML KwikPen INJECT 14 UNITS SUBCUTANEOUSLY TWICE A DAY WITH A MEAL. 15 mL 10   insulin  lispro protamine-lispro (HUMALOG 75/25 MIX) (75-25) 100 UNIT/ML SUSP injection Inject 14 Units into the skin 2 (two) times daily with a meal. 10 mL 0   Multiple Vitamin (MULTIVITAMIN WITH MINERALS) TABS tablet Take 1 tablet by mouth daily.     NOVOFINE PLUS 32G X 4 MM MISC USE TWICE A DAY AS DIRECTED 100 each 6   pantoprazole (PROTONIX) 40 MG tablet TAKE 1 TABLET BY MOUTH EVERY-OTHER-DAY 15 tablet 10   rosuvastatin (CRESTOR) 40 MG tablet TAKE (1) TABLET BY MOUTH ONCE A DAY 30 tablet 10   triamcinolone cream (KENALOG) 0.1 % Apply 1 application topically 2 (two) times daily. 30 g 0   No current facility-administered medications on file prior to visit.     ROS see history of present illness  Objective  Physical Exam Vitals:   08/25/21 1040  BP: 130/70  Pulse: 99  Temp: 98.1 F (36.7 C)  SpO2: 99%    BP Readings from Last 3 Encounters:  08/25/21 130/70  06/09/21 130/70  01/05/21 130/64   Wt Readings from Last 3 Encounters:  08/25/21 115 lb 12.8 oz (52.5 kg)  07/13/21 118 lb (53.5 kg)  06/20/21 118 lb (53.5 kg)    Physical Exam Constitutional:      General: She is not in acute distress. Neurological:     Mental  Status: She is alert.     Assessment/Plan: Please see individual problem list.  Problem List Items Addressed This Visit     Cognitive impairment    The patient presents with worsening cognitive impairment. This is an ongoing progressive issue. We discussed that depression could be playing a role in this.  Discussed treatment options for depression as outlined under that problem.  Discussed specific medication management for cognitive impairment.  The patient did not want medication at this time and I asked her to let me know if she changes her mind.  Her daughter is going to resend the long-term care paperwork to me so I can review it to see if I can write a letter to help them appeal a prior decision.      Depression, major, single episode, mild ([place])    The  patient does have depression per her report.  Discussed treatment with medication for this.  Discussed that the depression could be contributing to her memory difficulties.  Advised to monitor and let me know if she changes her mind regarding medication for this.        Return in about 3 months (around 11/23/2021) for memory/depression.  This visit occurred during the SARS-CoV-2 public health emergency.  Safety protocols were in place, including screening questions prior to the visit, additional usage of staff PPE, and extensive cleaning of exam room while observing appropriate contact time as indicated for disinfecting solutions.   I have spent 35 minutes in the care of this patient regarding history taking, documentation, completion of memory testing, discussion of plan and options for treatment.   Tommi Rumps, MD [place]

## 2021-08-25 NOTE — Assessment & Plan Note (Signed)
The patient does have depression per her report.  Discussed treatment with medication for this.  Discussed that the depression could be contributing to her memory difficulties.  Advised to monitor and let me know if she changes her mind regarding medication for this.

## 2021-08-25 NOTE — Assessment & Plan Note (Signed)
The patient presents with worsening cognitive impairment. This is an ongoing progressive issue. We discussed that depression could be playing a role in this.  Discussed treatment options for depression as outlined under that problem.  Discussed specific medication management for cognitive impairment.  The patient did not want medication at this time and I asked her to let me know if she changes her mind.  Her daughter is going to resend the long-term care paperwork to me so I can review it to see if I can write a letter to help them appeal a prior decision.

## 2021-09-12 DIAGNOSIS — L603 Nail dystrophy: Secondary | ICD-10-CM | POA: Diagnosis not present

## 2021-09-12 DIAGNOSIS — E109 Type 1 diabetes mellitus without complications: Secondary | ICD-10-CM | POA: Diagnosis not present

## 2021-09-12 DIAGNOSIS — M2042 Other hammer toe(s) (acquired), left foot: Secondary | ICD-10-CM | POA: Diagnosis not present

## 2021-09-12 DIAGNOSIS — M2041 Other hammer toe(s) (acquired), right foot: Secondary | ICD-10-CM | POA: Diagnosis not present

## 2021-09-29 ENCOUNTER — Encounter: Payer: Self-pay | Admitting: Family Medicine

## 2021-09-29 DIAGNOSIS — E11319 Type 2 diabetes mellitus with unspecified diabetic retinopathy without macular edema: Secondary | ICD-10-CM | POA: Insufficient documentation

## 2021-10-27 ENCOUNTER — Telehealth: Payer: Self-pay

## 2021-10-27 NOTE — Telephone Encounter (Signed)
FL2 FORM WAS SIGNED BY THE PROVIDER AND PUT IN THE MAIL TODAY BACK TO [place] AND THE FORM WAS COPIED AND SENT TO [facility].  Oakley Orban,CMA

## 2021-10-31 ENCOUNTER — Telehealth: Payer: Self-pay | Admitting: Family Medicine

## 2021-10-31 DIAGNOSIS — Z23 Encounter for immunization: Secondary | ICD-10-CM | POA: Diagnosis not present

## 2021-10-31 NOTE — Telephone Encounter (Signed)
Linda From [facility] called in stating that Pt is complaining of back pain. [name] stated that facility is giving provider tylenol every 48hrs prn. [name] stated that the tylenol is being giving on a standard order. [name] stated that pt is requesting more tylenol. [name] is request verbal order for Dr. Caryl Bis to send order over stating tylenol every 4hr prn. Per nina, orders have to be sent over for Dr. Caryl Bis to sign. Advise [name] of information.

## 2021-11-01 NOTE — Telephone Encounter (Signed)
I called the patient and spoke with her and she stated that her back is much better but she would talk with her daughter and see if she would call and schedule to be seen if there are any changes.  Laurie Harris,cma  ?

## 2021-11-01 NOTE — Telephone Encounter (Signed)
This appears to be a new issue.  She needs to be evaluated to determine appropriate treatment. ?

## 2021-11-01 NOTE — Telephone Encounter (Signed)
Noted  

## 2021-11-03 ENCOUNTER — Ambulatory Visit (INDEPENDENT_AMBULATORY_CARE_PROVIDER_SITE_OTHER): Payer: Medicare Other | Admitting: Family Medicine

## 2021-11-03 ENCOUNTER — Other Ambulatory Visit: Payer: Self-pay

## 2021-11-03 ENCOUNTER — Encounter: Payer: Self-pay | Admitting: Family Medicine

## 2021-11-03 DIAGNOSIS — M546 Pain in thoracic spine: Secondary | ICD-10-CM | POA: Diagnosis not present

## 2021-11-03 NOTE — Assessment & Plan Note (Signed)
I suspect the patient has strained something in her thoracic spine given that she has improved quickly over the last 6 days.  Discussed monitoring and if she does not continue to improve or if she worsens she could let us know and we can consider an x-ray.  She can continue Tylenol 1000 mg every 6-8 hours as needed for pain. ?

## 2021-11-03 NOTE — Progress Notes (Signed)
?Tommi Rumps, MD ?Phone: [phone] ? ?Laurie Harris is a 81 y.o. female who presents today for f/u. ? ?Back pain: Patient notes onset 6 days ago.  Started in her mid back.  She notes no specific injury though thinks she may have strained something when she was trying to back her chair up from the dinner table.  She notes they redial the floor at her facility and she had to struggle to get the chair back.  She notes no falls.  No exacerbation by bending.  She has been taking Tylenol intermittently with good benefit.  She notes no radiation, numbness, weakness, or incontinence. ? ?Social History  ? ?Tobacco Use  ?Smoking Status Never  ?Smokeless Tobacco Never  ? ? ?Current Outpatient Medications on File Prior to Visit  ?Medication Sig Dispense Refill  ? aspirin EC 81 MG tablet Take 81 mg by mouth daily.    ? BD AUTOSHIELD DUO 30G X 5 MM MISC USE TWICE DAILY AS DIRECTED 100 each 1  ? benazepril (LOTENSIN) 5 MG tablet TAKE ONE TABLET EVERY DAY 90 tablet 2  ? calcium carbonate (OS-CAL) 600 MG TABS tablet Take one tablet by mouth once a day. 30 tablet 10  ? Cholecalciferol (VITAMIN D3) 50 MCG (2000 UT) capsule Take one tablet by mouth daily.  30 capsule 10  ? docusate sodium (COLACE) 100 MG capsule Take 1 capsule (100 mg total) by mouth 2 (two) times daily as needed for mild constipation. 10 capsule 0  ? fenofibrate 160 MG tablet Take 1 tablet (160 mg total) by mouth daily. 90 tablet 1  ? HUMALOG MIX 75/25 KWIKPEN (75-25) 100 UNIT/ML KwikPen INJECT 14 UNITS SUBCUTANEOUSLY TWICE A DAY WITH A MEAL. 15 mL 10  ? insulin lispro protamine-lispro (HUMALOG 75/25 MIX) (75-25) 100 UNIT/ML SUSP injection Inject 14 Units into the skin 2 (two) times daily with a meal. 10 mL 0  ? Multiple Vitamin (MULTIVITAMIN WITH MINERALS) TABS tablet Take 1 tablet by mouth daily.    ? NOVOFINE PLUS 32G X 4 MM MISC USE TWICE A DAY AS DIRECTED 100 each 6  ? pantoprazole (PROTONIX) 40 MG tablet TAKE 1 TABLET BY MOUTH EVERY-OTHER-DAY 15 tablet  10  ? rosuvastatin (CRESTOR) 40 MG tablet TAKE (1) TABLET BY MOUTH ONCE A DAY 30 tablet 10  ? triamcinolone cream (KENALOG) 0.1 % Apply 1 application topically 2 (two) times daily. 30 g 0  ? ?No current facility-administered medications on file prior to visit.  ? ? ? ?ROS see history of present illness ? ?Objective ? ?Physical Exam ?Vitals:  ? 11/03/21 1108  ?BP: 140/80  ?Temp: 97.8 ?F (36.6 ?C)  ?SpO2: 99%  ? ? ?BP Readings from Last 3 Encounters:  ?11/03/21 140/80  ?08/25/21 130/70  ?06/09/21 130/70  ? ?Wt Readings from Last 3 Encounters:  ?11/03/21 117 lb 6.4 oz (53.3 kg)  ?08/25/21 115 lb 12.8 oz (52.5 kg)  ?07/13/21 118 lb (53.5 kg)  ? ? ?Physical Exam ?Musculoskeletal:  ?     Back: ? ?   Comments: There is scoliosis as outlined above by the line on the picture.  ?Neurological:  ?   Comments: 5/5 strength in bilateral biceps, triceps, grip, quads, hamstrings, plantar and dorsiflexion, sensation to light touch intact in bilateral UE and LE  ? ? ? ?Assessment/Plan: Please see individual problem list. ? ?Problem List Items Addressed This Visit   ? ? Thoracic back pain  ?  I suspect the patient has strained something in her  thoracic spine given that she has improved quickly over the last 6 days.  Discussed monitoring and if she does not continue to improve or if she worsens she could let us know and we can consider an x-ray.  She can continue Tylenol 1000 mg every 6-8 hours as needed for pain. ?  ?  ? ? ?Return for As scheduled. ? ?This visit occurred during the SARS-CoV-2 public health emergency.  Safety protocols were in place, including screening questions prior to the visit, additional usage of staff PPE, and extensive cleaning of exam room while observing appropriate contact time as indicated for disinfecting solutions.  ? ? ?Tommi Rumps, MD ?[place] ? ?

## 2021-11-03 NOTE — Patient Instructions (Signed)
Nice to see you. ?If your back does not continue to improve or if it worsens again please let us know. ?

## 2021-11-19 DIAGNOSIS — Z20822 Contact with and (suspected) exposure to covid-19: Secondary | ICD-10-CM | POA: Diagnosis not present

## 2021-11-22 DIAGNOSIS — M79672 Pain in left foot: Secondary | ICD-10-CM | POA: Diagnosis not present

## 2021-11-22 DIAGNOSIS — L600 Ingrowing nail: Secondary | ICD-10-CM | POA: Diagnosis not present

## 2021-11-22 DIAGNOSIS — M2041 Other hammer toe(s) (acquired), right foot: Secondary | ICD-10-CM | POA: Diagnosis not present

## 2021-11-22 DIAGNOSIS — B351 Tinea unguium: Secondary | ICD-10-CM | POA: Diagnosis not present

## 2021-11-22 DIAGNOSIS — M79671 Pain in right foot: Secondary | ICD-10-CM | POA: Diagnosis not present

## 2021-11-22 DIAGNOSIS — M2042 Other hammer toe(s) (acquired), left foot: Secondary | ICD-10-CM | POA: Diagnosis not present

## 2021-12-15 ENCOUNTER — Ambulatory Visit: Payer: Medicare Other | Admitting: Family Medicine

## 2021-12-18 DIAGNOSIS — Z20822 Contact with and (suspected) exposure to covid-19: Secondary | ICD-10-CM | POA: Diagnosis not present

## 2021-12-19 ENCOUNTER — Ambulatory Visit (INDEPENDENT_AMBULATORY_CARE_PROVIDER_SITE_OTHER): Payer: Medicare Other | Admitting: Family Medicine

## 2021-12-19 ENCOUNTER — Encounter: Payer: Self-pay | Admitting: Family Medicine

## 2021-12-19 DIAGNOSIS — Z794 Long term (current) use of insulin: Secondary | ICD-10-CM | POA: Diagnosis not present

## 2021-12-19 DIAGNOSIS — E1129 Type 2 diabetes mellitus with other diabetic kidney complication: Secondary | ICD-10-CM | POA: Diagnosis not present

## 2021-12-19 DIAGNOSIS — W19XXXA Unspecified fall, initial encounter: Secondary | ICD-10-CM

## 2021-12-19 DIAGNOSIS — I700 Atherosclerosis of aorta: Secondary | ICD-10-CM | POA: Diagnosis not present

## 2021-12-19 DIAGNOSIS — I708 Atherosclerosis of other arteries: Secondary | ICD-10-CM | POA: Diagnosis not present

## 2021-12-19 DIAGNOSIS — T148XXA Other injury of unspecified body region, initial encounter: Secondary | ICD-10-CM

## 2021-12-19 DIAGNOSIS — K625 Hemorrhage of anus and rectum: Secondary | ICD-10-CM

## 2021-12-19 DIAGNOSIS — E7800 Pure hypercholesterolemia, unspecified: Secondary | ICD-10-CM | POA: Diagnosis not present

## 2021-12-19 DIAGNOSIS — I10 Essential (primary) hypertension: Secondary | ICD-10-CM | POA: Diagnosis not present

## 2021-12-19 DIAGNOSIS — K219 Gastro-esophageal reflux disease without esophagitis: Secondary | ICD-10-CM

## 2021-12-19 DIAGNOSIS — R296 Repeated falls: Secondary | ICD-10-CM | POA: Insufficient documentation

## 2021-12-19 LAB — COMPREHENSIVE METABOLIC PANEL
ALT: 24 U/L (ref 0–35)
AST: 29 U/L (ref 0–37)
Albumin: 4.1 g/dL (ref 3.5–5.2)
Alkaline Phosphatase: 94 U/L (ref 39–117)
BUN: 24 mg/dL — ABNORMAL HIGH (ref 6–23)
CO2: 29 mEq/L (ref 19–32)
Calcium: 9.7 mg/dL (ref 8.4–10.5)
Chloride: 108 mEq/L (ref 96–112)
Creatinine, Ser: 1.12 mg/dL (ref 0.40–1.20)
GFR: 45.58 mL/min — ABNORMAL LOW (ref >60.00)
Glucose, Bld: 176 mg/dL — ABNORMAL HIGH (ref 70–99)
Potassium: 4.2 mEq/L (ref 3.5–5.1)
Sodium: 145 mEq/L (ref 135–145)
Total Bilirubin: 0.8 mg/dL (ref 0.2–1.2)
Total Protein: 6.4 g/dL (ref 6.0–8.3)

## 2021-12-19 LAB — CBC WITH DIFFERENTIAL/PLATELET
Basophils Absolute: 0 10*3/uL (ref 0.0–0.1)
Basophils Relative: 1.1 % (ref 0.0–3.0)
Eosinophils Absolute: 0 10*3/uL (ref 0.0–0.7)
Eosinophils Relative: 1.3 % (ref 0.0–5.0)
HCT: 38.6 % (ref 36.0–46.0)
Hemoglobin: 13.1 g/dL (ref 12.0–15.0)
Lymphocytes Relative: 33.3 % (ref 12.0–46.0)
Lymphs Abs: 1.2 10*3/uL (ref 0.7–4.0)
MCHC: 33.8 g/dL (ref 30.0–36.0)
MCV: 101.9 fl — ABNORMAL HIGH (ref 78.0–100.0)
Monocytes Absolute: 0.2 10*3/uL (ref 0.1–1.0)
Monocytes Relative: 6.7 % (ref 3.0–12.0)
Neutro Abs: 2.1 10*3/uL (ref 1.4–7.7)
Neutrophils Relative %: 57.6 % (ref 43.0–77.0)
Platelets: 134 10*3/uL — ABNORMAL LOW (ref 150.0–400.0)
RBC: 3.79 Mil/uL — ABNORMAL LOW (ref 3.87–5.11)
RDW: 14.3 % (ref 11.5–15.5)
WBC: 3.6 10*3/uL — ABNORMAL LOW (ref 4.0–10.5)

## 2021-12-19 LAB — LIPID PANEL
Cholesterol: 129 mg/dL (ref 0–200)
HDL: 36.8 mg/dL — ABNORMAL LOW (ref >39.00)
NonHDL: 92.35
Total CHOL/HDL Ratio: 4
Triglycerides: 226 mg/dL — ABNORMAL HIGH (ref 0.0–149.0)
VLDL: 45.2 mg/dL — ABNORMAL HIGH (ref 0.0–40.0)

## 2021-12-19 LAB — HEMOGLOBIN A1C: Hgb A1c MFr Bld: 6.4 % (ref 4.6–6.5)

## 2021-12-19 LAB — LDL CHOLESTEROL, DIRECT: Direct LDL: 62 mg/dL

## 2021-12-19 NOTE — Assessment & Plan Note (Signed)
Seems to be related to hemorrhoids based on patient description.  We will refer to GI for further evaluation. ?

## 2021-12-19 NOTE — Progress Notes (Addendum)
?Tommi Rumps, MD ?Phone: [phone] ? ?Laurie Harris is a 81 y.o. female who presents today for f/u. ? ?DIABETES ?Disease Monitoring: ?Blood Sugar ranges-130s-150s Polyuria/phagia/dipsia- no      Optho- UTD ?Medications: ?Compliance- taking humalog 75/25 14 u BID  Hypoglycemic symptoms- no ? ?HYPERTENSION ?Disease Monitoring ?Home BP Monitoring 120s-140s/60s Chest pain- no    Dyspnea- no ?Medications ?Compliance-  taking benazepril.  Edema- no ?BMET ?   ?Component Value Date/Time  ? NA 140 06/09/2021 1008  ? NA 139 11/05/2019 0000  ? NA 141 12/31/2014 1646  ? K 4.2 06/09/2021 1008  ? K 3.4 (L) 12/31/2014 1646  ? CL 108 06/09/2021 1008  ? CL 110 12/31/2014 1646  ? CO2 24 06/09/2021 1008  ? CO2 22 12/31/2014 1646  ? GLUCOSE 169 (H) 06/09/2021 1008  ? GLUCOSE 174 (H) 12/31/2014 1646  ? BUN 31 (H) 06/09/2021 1008  ? BUN 24 (A) 11/05/2019 0000  ? BUN 32 (H) 12/31/2014 1646  ? CREATININE 1.29 (H) 06/09/2021 1008  ? CREATININE 1.02 (H) 12/31/2014 1646  ? CALCIUM 10.5 06/09/2021 1008  ? CALCIUM 9.0 12/31/2014 1646  ? GFRNONAA 42 (L) 12/08/2019 2205  ? GFRNONAA 53 (L) 12/31/2014 1646  ? GFRAA 48 (L) 12/08/2019 2205  ? GFRAA >60 12/31/2014 1646  ? ?GERD:   ?Reflux symptoms: no   ?Abd pain: no   ?Blood in stool: yes, attributes to hemorrhoids, mostly occurs when wipes after straining  ?Dysphagia: no   ?EGD: no  ?Medication: protonix every other day ? ?Bruising: Patient notes she typically gets bruises after her insulin injections though about a week ago she developed a big bruise over her right lower abdomen and is unsure why this is there.  She reports no injury at the time of the bruise.  She does report falling about a month ago after trying to get out of the way of somebody who came out of the door.  Her walker kept going and she fell.  She noted no head injury.  She noted no other injury.  She is unsure what contacted the ground. ? ? ?Social History  ? ?Tobacco Use  ?Smoking Status Never  ?Smokeless Tobacco Never   ? ? ?Current Outpatient Medications on File Prior to Visit  ?Medication Sig Dispense Refill  ? aspirin EC 81 MG tablet Take 81 mg by mouth daily.    ? BD AUTOSHIELD DUO 30G X 5 MM MISC USE TWICE DAILY AS DIRECTED 100 each 1  ? benazepril (LOTENSIN) 5 MG tablet TAKE ONE TABLET EVERY DAY 90 tablet 2  ? calcium carbonate (OS-CAL) 600 MG TABS tablet Take one tablet by mouth once a day. 30 tablet 10  ? Cholecalciferol (VITAMIN D3) 50 MCG (2000 UT) capsule Take one tablet by mouth daily.  30 capsule 10  ? docusate sodium (COLACE) 100 MG capsule Take 1 capsule (100 mg total) by mouth 2 (two) times daily as needed for mild constipation. 10 capsule 0  ? fenofibrate 160 MG tablet Take 1 tablet (160 mg total) by mouth daily. 90 tablet 1  ? HUMALOG MIX 75/25 KWIKPEN (75-25) 100 UNIT/ML KwikPen INJECT 14 UNITS SUBCUTANEOUSLY TWICE A DAY WITH A MEAL. 15 mL 10  ? insulin lispro protamine-lispro (HUMALOG 75/25 MIX) (75-25) 100 UNIT/ML SUSP injection Inject 14 Units into the skin 2 (two) times daily with a meal. 10 mL 0  ? Multiple Vitamin (MULTIVITAMIN WITH MINERALS) TABS tablet Take 1 tablet by mouth daily.    ? NOVOFINE PLUS  32G X 4 MM MISC USE TWICE A DAY AS DIRECTED 100 each 6  ? pantoprazole (PROTONIX) 40 MG tablet TAKE 1 TABLET BY MOUTH EVERY-OTHER-DAY 15 tablet 10  ? rosuvastatin (CRESTOR) 40 MG tablet TAKE (1) TABLET BY MOUTH ONCE A DAY 30 tablet 10  ? triamcinolone cream (KENALOG) 0.1 % Apply 1 application topically 2 (two) times daily. 30 g 0  ? ?No current facility-administered medications on file prior to visit.  ? ? ? ?ROS see history of present illness ? ?Objective ? ?Physical Exam ?Vitals:  ? 12/19/21 0939  ?BP: 120/70  ?Pulse: (!) 116  ?Temp: 97.6 ?F (36.4 ?C)  ?SpO2: 99%  ? ? ?BP Readings from Last 3 Encounters:  ?12/19/21 120/70  ?11/03/21 140/80  ?08/25/21 130/70  ? ?Wt Readings from Last 3 Encounters:  ?12/19/21 116 lb 6.4 oz (52.8 kg)  ?11/03/21 117 lb 6.4 oz (53.3 kg)  ?08/25/21 115 lb 12.8 oz (52.5 kg)   ? ? ?Physical Exam ?Constitutional:   ?   General: She is not in acute distress. ?   Appearance: She is not diaphoretic.  ?Cardiovascular:  ?   Rate and Rhythm: Normal rate and regular rhythm.  ?   Heart sounds: Normal heart sounds.  ?Pulmonary:  ?   Effort: Pulmonary effort is normal.  ?   Breath sounds: Normal breath sounds.  ?Abdominal:  ?   General: There is no distension.  ?   Tenderness: There is no abdominal tenderness.  ? ? ?Skin: ?   General: Skin is warm and dry.  ?Neurological:  ?   Mental Status: She is alert.  ? ?Diabetic Foot Exam - Simple   ?Simple Foot Form ?Diabetic Foot exam was performed with the following findings: Yes 12/19/2021 10:22 AM  ?Visual Inspection ?See comments: Yes ?Sensation Testing ?Intact to touch and monofilament testing bilaterally: Yes ?Pulse Check ?Posterior Tibialis and Dorsalis pulse intact bilaterally: Yes ?Comments ?hallux valgus bilaterally, no other deformities, ulcerations, or skin breakdown ?  ? ? ? ?Assessment/Plan: Please see individual problem list. ? ?Problem List Items Addressed This Visit   ? ? Benign essential HTN (Chronic)  ?  Adequately controlled.  She will continue benazepril 5 mg once daily.  Check lab work. ? ?  ?  ? Relevant Orders  ? Comp Met (CMET)  ? Lipid panel  ? GERD (gastroesophageal reflux disease) (Chronic)  ?  Well-controlled.  She will continue Protonix 40 mg every other day. ? ?  ?  ? Hyperlipidemia (Chronic)  ?  Check lipid panel. Continue crestor 40 mg daily.  ? ?  ?  ? Relevant Orders  ? Comp Met (CMET)  ? Lipid panel  ? Type II diabetes mellitus with renal manifestations (HCC) (Chronic)  ?  Check A1c.  Continue Humalog 75/25 14 units twice daily. ? ?  ?  ? Relevant Orders  ? HgB A1c  ? Aorto-iliac atherosclerosis ([place])  ?  Continue risk factor management. ? ?  ?  ? Bright red blood per rectum  ?  Seems to be related to hemorrhoids based on patient description.  We will refer to GI for further evaluation. ? ?  ?  ? Relevant Orders  ?  Ambulatory referral to Gastroenterology  ? Bruising  ?  Suspect most of her bruising is related to her insulin injections that she does have a large bruise over her right lower abdomen of uncertain cause.  We will check a CBC with differential. ? ?  ?  ?  Relevant Orders  ? CBC w/Diff  ? Fall  ?  The patient's fall seems to be mechanical given that she lost control of her walker after being surprised by somebody coming out of their door at her facility.  She will continue to use her walker.  She does have new well fitting shoes.  Given when the fall occurred I do not think this is the cause of her bruise. ? ?  ?  ? ? ? ?Health Maintenance: The patient was encouraged to get the Shingrix vaccine. ? ?Return in about 6 months (around 06/20/2022). ? ?This visit occurred during the SARS-CoV-2 public health emergency.  Safety protocols were in place, including screening questions prior to the visit, additional usage of staff PPE, and extensive cleaning of exam room while observing appropriate contact time as indicated for disinfecting solutions.  ? ? ?Tommi Rumps, MD ?[place] ? ?

## 2021-12-19 NOTE — Assessment & Plan Note (Signed)
Suspect most of her bruising is related to her insulin injections that she does have a large bruise over her right lower abdomen of uncertain cause.  We will check a CBC with differential. ?

## 2021-12-19 NOTE — Assessment & Plan Note (Signed)
The patient's fall seems to be mechanical given that she lost control of her walker after being surprised by somebody coming out of their door at her facility.  She will continue to use her walker.  She does have new well fitting shoes.  Given when the fall occurred I do not think this is the cause of her bruise. ?

## 2021-12-19 NOTE — Patient Instructions (Signed)
Nice to see you. ?GI should contact you to schedule an appointment. ?We will get lab work today and contact you with results. ?Please continue to use your walker and wear well fitting shoes. ?

## 2021-12-19 NOTE — Assessment & Plan Note (Signed)
Check A1c.  Continue Humalog 75/25 14 units twice daily. ?

## 2021-12-19 NOTE — Assessment & Plan Note (Signed)
Well-controlled.  She will continue Protonix 40 mg every other day. ?

## 2021-12-19 NOTE — Assessment & Plan Note (Signed)
Continue risk factor management. 

## 2021-12-19 NOTE — Assessment & Plan Note (Signed)
Adequately controlled.  She will continue benazepril 5 mg once daily.  Check lab work. ?

## 2021-12-19 NOTE — Assessment & Plan Note (Signed)
Check lipid panel. Continue crestor 40 mg daily.  ?

## 2021-12-26 ENCOUNTER — Telehealth: Payer: Self-pay | Admitting: Family Medicine

## 2021-12-26 ENCOUNTER — Other Ambulatory Visit: Payer: Self-pay

## 2021-12-26 DIAGNOSIS — T148XXA Other injury of unspecified body region, initial encounter: Secondary | ICD-10-CM

## 2021-12-26 NOTE — Telephone Encounter (Signed)
Noted will ask Dr. Caryl Bis when patient should return.  ?

## 2021-12-26 NOTE — Telephone Encounter (Signed)
Patient returned office phone call and labs were read to her. Patient understood and had no questions. Unable to schedule labs, no orders for recheck. ?

## 2022-01-16 ENCOUNTER — Other Ambulatory Visit (INDEPENDENT_AMBULATORY_CARE_PROVIDER_SITE_OTHER): Payer: Medicare Other

## 2022-01-16 ENCOUNTER — Telehealth: Payer: Self-pay

## 2022-01-16 DIAGNOSIS — T148XXA Other injury of unspecified body region, initial encounter: Secondary | ICD-10-CM

## 2022-01-16 LAB — CBC WITH DIFFERENTIAL/PLATELET
Basophils Absolute: 0 10*3/uL (ref 0.0–0.1)
Basophils Relative: 0.5 % (ref 0.0–3.0)
Eosinophils Absolute: 0.1 10*3/uL (ref 0.0–0.7)
Eosinophils Relative: 1 % (ref 0.0–5.0)
HCT: 40.7 % (ref 36.0–46.0)
Hemoglobin: 13.5 g/dL (ref 12.0–15.0)
Lymphocytes Relative: 32 % (ref 12.0–46.0)
Lymphs Abs: 1.7 10*3/uL (ref 0.7–4.0)
MCHC: 33.3 g/dL (ref 30.0–36.0)
MCV: 102.3 fl — ABNORMAL HIGH (ref 78.0–100.0)
Monocytes Absolute: 0.4 10*3/uL (ref 0.1–1.0)
Monocytes Relative: 6.9 % (ref 3.0–12.0)
Neutro Abs: 3.2 10*3/uL (ref 1.4–7.7)
Neutrophils Relative %: 59.6 % (ref 43.0–77.0)
Platelets: 141 10*3/uL — ABNORMAL LOW (ref 150.0–400.0)
RBC: 3.97 Mil/uL (ref 3.87–5.11)
RDW: 14.8 % (ref 11.5–15.5)
WBC: 5.3 10*3/uL (ref 4.0–10.5)

## 2022-01-16 NOTE — Telephone Encounter (Signed)
Patient's daughter dropped off paperwork that needs to be completed for today's visit and faxed to [facility] today. ? ?Paperwork is in color folder up front. ?

## 2022-01-17 ENCOUNTER — Other Ambulatory Visit: Payer: Self-pay | Admitting: Family Medicine

## 2022-01-17 DIAGNOSIS — D7589 Other specified diseases of blood and blood-forming organs: Secondary | ICD-10-CM

## 2022-01-17 NOTE — Telephone Encounter (Signed)
Can you find out what this paperwork is for?  Typically this is only completed when the patient is seen for a visit and she has not been seen since April. ?

## 2022-01-18 NOTE — Telephone Encounter (Signed)
I called and spoke with the patient and she stated that the papers was for 3 free pairs of shoes for her.  Yisrael Obryan,cma

## 2022-01-19 NOTE — Telephone Encounter (Signed)
Signed.  Please fax back. 

## 2022-01-23 NOTE — Telephone Encounter (Signed)
Faxed and confirmation given. ng

## 2022-01-25 ENCOUNTER — Other Ambulatory Visit (INDEPENDENT_AMBULATORY_CARE_PROVIDER_SITE_OTHER): Payer: Medicare Other

## 2022-01-25 DIAGNOSIS — D7589 Other specified diseases of blood and blood-forming organs: Secondary | ICD-10-CM

## 2022-01-25 LAB — FOLATE: Folate: >24.2 ng/mL (ref >5.9)

## 2022-01-25 LAB — VITAMIN B12: Vitamin B-12: 323 pg/mL (ref 211–911)

## 2022-02-23 DIAGNOSIS — M2041 Other hammer toe(s) (acquired), right foot: Secondary | ICD-10-CM | POA: Diagnosis not present

## 2022-02-23 DIAGNOSIS — M2042 Other hammer toe(s) (acquired), left foot: Secondary | ICD-10-CM | POA: Diagnosis not present

## 2022-02-23 DIAGNOSIS — M79671 Pain in right foot: Secondary | ICD-10-CM | POA: Diagnosis not present

## 2022-02-23 DIAGNOSIS — M79672 Pain in left foot: Secondary | ICD-10-CM | POA: Diagnosis not present

## 2022-02-23 DIAGNOSIS — L600 Ingrowing nail: Secondary | ICD-10-CM | POA: Diagnosis not present

## 2022-02-23 DIAGNOSIS — B351 Tinea unguium: Secondary | ICD-10-CM | POA: Diagnosis not present

## 2022-03-19 ENCOUNTER — Ambulatory Visit: Payer: Medicare Other | Admitting: Nurse Practitioner

## 2022-04-10 ENCOUNTER — Encounter: Payer: Self-pay | Admitting: Family Medicine

## 2022-04-11 NOTE — Telephone Encounter (Signed)
Can you call her and see what she needs to discuss? So that I know what I may need to do or if there is anything that I can do. Thanks.

## 2022-04-17 NOTE — Telephone Encounter (Signed)
Noted. I can certainly see her back in the office to do follow-up memory testing and recheck to see how things are going.

## 2022-04-22 ENCOUNTER — Encounter: Payer: Self-pay | Admitting: Family Medicine

## 2022-04-25 ENCOUNTER — Ambulatory Visit (INDEPENDENT_AMBULATORY_CARE_PROVIDER_SITE_OTHER): Payer: Medicare Other | Admitting: Family Medicine

## 2022-04-25 ENCOUNTER — Encounter: Payer: Self-pay | Admitting: Family Medicine

## 2022-04-25 DIAGNOSIS — Z794 Long term (current) use of insulin: Secondary | ICD-10-CM

## 2022-04-25 DIAGNOSIS — I10 Essential (primary) hypertension: Secondary | ICD-10-CM

## 2022-04-25 DIAGNOSIS — I700 Atherosclerosis of aorta: Secondary | ICD-10-CM | POA: Diagnosis not present

## 2022-04-25 DIAGNOSIS — E1129 Type 2 diabetes mellitus with other diabetic kidney complication: Secondary | ICD-10-CM

## 2022-04-25 DIAGNOSIS — I708 Atherosclerosis of other arteries: Secondary | ICD-10-CM

## 2022-04-25 DIAGNOSIS — R4189 Other symptoms and signs involving cognitive functions and awareness: Secondary | ICD-10-CM | POA: Diagnosis not present

## 2022-04-25 DIAGNOSIS — D7589 Other specified diseases of blood and blood-forming organs: Secondary | ICD-10-CM

## 2022-04-25 LAB — CBC WITH DIFFERENTIAL/PLATELET
Basophils Absolute: 0.1 10*3/uL (ref 0.0–0.1)
Basophils Relative: 1.2 % (ref 0.0–3.0)
Eosinophils Absolute: 0.2 10*3/uL (ref 0.0–0.7)
Eosinophils Relative: 3 % (ref 0.0–5.0)
HCT: 36.5 % (ref 36.0–46.0)
Hemoglobin: 12.5 g/dL (ref 12.0–15.0)
Lymphocytes Relative: 36.2 % (ref 12.0–46.0)
Lymphs Abs: 1.9 10*3/uL (ref 0.7–4.0)
MCHC: 34.2 g/dL (ref 30.0–36.0)
MCV: 99.2 fl (ref 78.0–100.0)
Monocytes Absolute: 0.4 10*3/uL (ref 0.1–1.0)
Monocytes Relative: 7.5 % (ref 3.0–12.0)
Neutro Abs: 2.7 10*3/uL (ref 1.4–7.7)
Neutrophils Relative %: 52.1 % (ref 43.0–77.0)
Platelets: 136 10*3/uL — ABNORMAL LOW (ref 150.0–400.0)
RBC: 3.68 Mil/uL — ABNORMAL LOW (ref 3.87–5.11)
RDW: 14.6 % (ref 11.5–15.5)
WBC: 5.1 10*3/uL (ref 4.0–10.5)

## 2022-04-25 LAB — BASIC METABOLIC PANEL
BUN: 27 mg/dL — ABNORMAL HIGH (ref 6–23)
CO2: 23 mEq/L (ref 19–32)
Calcium: 8.9 mg/dL (ref 8.4–10.5)
Chloride: 109 mEq/L (ref 96–112)
Creatinine, Ser: 1.05 mg/dL (ref 0.40–1.20)
GFR: 49.13 mL/min — ABNORMAL LOW (ref >60.00)
Glucose, Bld: 164 mg/dL — ABNORMAL HIGH (ref 70–99)
Potassium: 3.7 mEq/L (ref 3.5–5.1)
Sodium: 140 mEq/L (ref 135–145)

## 2022-04-25 LAB — HEMOGLOBIN A1C: Hgb A1c MFr Bld: 6.5 % (ref 4.6–6.5)

## 2022-04-25 NOTE — Assessment & Plan Note (Signed)
Slums score of 26/30.  Continues to have some cognitive issues.  The patient's daughter has had trouble getting her long-term care insurance to cover long-term care given that the patient does not have severe enough memory issues and does not meet their ADL requirements.  Discussed that I doubt they will cover based on my interaction today though I did advise that I could provide a letter asking for consideration of coverage.  The patient's daughter will contact us by the end of next week if she has not heard about this letter being prepared.

## 2022-04-25 NOTE — Assessment & Plan Note (Signed)
Recheck CBC. 

## 2022-04-25 NOTE — Assessment & Plan Note (Signed)
Adequately controlled.  Continue benazepril 5 mg daily.

## 2022-04-25 NOTE — Patient Instructions (Signed)
Nice to see you. We will check lab work today and contact you with the results. If you have not heard from [name] about the letter by the end of next week please let me know.

## 2022-04-25 NOTE — Progress Notes (Signed)
Tommi Rumps, MD Phone: [phone]  Laurie Harris is a 81 y.o. female who presents today for follow-up.  Memory difficulty: Patient and her daughter both note continued memory issues.  She is having more trouble remembering short-term and longer term things.  She has the need for assistance with bathing though has no other ADL deficits.  She does not wander or get lost.  She does note difficulty balancing her checkbook.  Hypertension: Blood pressures have been stable.  She is on benazepril.  No chest pain or shortness of breath.  Diabetes: Blood sugars have been typically 120-130.  She is taking Humalog 75/25 40 units twice daily.  No hypoglycemia.  Social History   Tobacco Use  Smoking Status Never  Smokeless Tobacco Never    Current Outpatient Medications on File Prior to Visit  Medication Sig Dispense Refill   aspirin EC 81 MG tablet Take 81 mg by mouth daily.     BD AUTOSHIELD DUO 30G X 5 MM MISC USE TWICE DAILY AS DIRECTED 100 each 1   benazepril (LOTENSIN) 5 MG tablet TAKE ONE TABLET EVERY DAY 90 tablet 2   calcium carbonate (OS-CAL) 600 MG TABS tablet Take one tablet by mouth once a day. 30 tablet 10   Cholecalciferol (VITAMIN D3) 50 MCG (2000 UT) capsule Take one tablet by mouth daily.  30 capsule 10   docusate sodium (COLACE) 100 MG capsule Take 1 capsule (100 mg total) by mouth 2 (two) times daily as needed for mild constipation. 10 capsule 0   fenofibrate 160 MG tablet Take 1 tablet (160 mg total) by mouth daily. 90 tablet 1   HUMALOG MIX 75/25 KWIKPEN (75-25) 100 UNIT/ML KwikPen INJECT 14 UNITS SUBCUTANEOUSLY TWICE A DAY WITH A MEAL. 15 mL 10   insulin lispro protamine-lispro (HUMALOG 75/25 MIX) (75-25) 100 UNIT/ML SUSP injection Inject 14 Units into the skin 2 (two) times daily with a meal. 10 mL 0   Multiple Vitamin (MULTIVITAMIN WITH MINERALS) TABS tablet Take 1 tablet by mouth daily.     NOVOFINE PLUS 32G X 4 MM MISC USE TWICE A DAY AS DIRECTED 100 each 6    pantoprazole (PROTONIX) 40 MG tablet TAKE 1 TABLET BY MOUTH EVERY-OTHER-DAY 15 tablet 10   rosuvastatin (CRESTOR) 40 MG tablet TAKE (1) TABLET BY MOUTH ONCE A DAY 30 tablet 10   triamcinolone cream (KENALOG) 0.1 % Apply 1 application topically 2 (two) times daily. 30 g 0   No current facility-administered medications on file prior to visit.     ROS see history of present illness  Objective  Physical Exam Vitals:   04/25/22 1342  BP: (!) 140/70  Pulse: 74  Temp: 98.4 F (36.9 C)  SpO2: 97%    BP Readings from Last 3 Encounters:  04/25/22 (!) 140/70  12/19/21 120/70  11/03/21 140/80   Wt Readings from Last 3 Encounters:  04/25/22 119 lb 6.4 oz (54.2 kg)  12/19/21 116 lb 6.4 oz (52.8 kg)  11/03/21 117 lb 6.4 oz (53.3 kg)    Physical Exam Constitutional:      General: She is not in acute distress.    Appearance: She is not diaphoretic.  Pulmonary:     Effort: Pulmonary effort is normal.  Neurological:     Mental Status: She is alert.      Assessment/Plan: Please see individual problem list.  Problem List Items Addressed This Visit     Benign essential HTN (Chronic)    Adequately controlled.  Continue benazepril  5 mg daily.      Relevant Orders   Basic Metabolic Panel (BMET)   Type II diabetes mellitus with renal manifestations (HCC) (Chronic)    Check A1c.  Continue Humalog 75/25 14 units twice daily with meals.      Relevant Orders   HgB A1c   Cognitive impairment    Slums score of 26/30.  Continues to have some cognitive issues.  The patient's daughter has had trouble getting her long-term care insurance to cover long-term care given that the patient does not have severe enough memory issues and does not meet their ADL requirements.  Discussed that I doubt they will cover based on my interaction today though I did advise that I could provide a letter asking for consideration of coverage.  The patient's daughter will contact us by the end of next week if she  has not heard about this letter being prepared.      Macrocytosis without anemia    Recheck CBC.      Relevant Orders   CBC w/Diff    Return in about 6 months (around 10/26/2022).   Tommi Rumps, MD [place]

## 2022-04-25 NOTE — Assessment & Plan Note (Signed)
Check A1c.  Continue Humalog 75/25 14 units twice daily with meals.

## 2022-05-03 ENCOUNTER — Ambulatory Visit (INDEPENDENT_AMBULATORY_CARE_PROVIDER_SITE_OTHER): Payer: Medicare Other | Admitting: Gastroenterology

## 2022-05-03 ENCOUNTER — Encounter: Payer: Self-pay | Admitting: Gastroenterology

## 2022-05-03 VITALS — BP 163/82 | HR 79 | Temp 97.7°F | Ht 66.0 in | Wt 117.5 lb

## 2022-05-03 DIAGNOSIS — K644 Residual hemorrhoidal skin tags: Secondary | ICD-10-CM

## 2022-05-03 DIAGNOSIS — K641 Second degree hemorrhoids: Secondary | ICD-10-CM | POA: Diagnosis not present

## 2022-05-03 DIAGNOSIS — K5904 Chronic idiopathic constipation: Secondary | ICD-10-CM

## 2022-05-03 DIAGNOSIS — K625 Hemorrhage of anus and rectum: Secondary | ICD-10-CM | POA: Diagnosis not present

## 2022-05-03 MED ORDER — HYDROCORTISONE (PERIANAL) 2.5 % EX CREA
1.0000 | TOPICAL_CREAM | Freq: Two times a day (BID) | CUTANEOUS | 1 refills | Status: AC
Start: 2022-05-03 — End: ?

## 2022-05-03 NOTE — Patient Instructions (Addendum)
Start Miralax 1 cupful daily  2. Start Fiber supplements once a day either Benefiber or citrucel 3.Anusol cream Twice daily apply pea size per rectum    Please call our office to speak with my nurse Ulyess Blossom [phone] during business hours from 8am to 4pm if you have any questions/concerns. During after hours, you will be redirected to on call GI physician. For any emergency please call 911 or go the nearest emergency room.    Cephas Darby, MD [address]  [place]  [place], [place] [address]  Main: [phone]  Fax: [phone]

## 2022-05-03 NOTE — Progress Notes (Signed)
Laurie Darby, MD [address]  [place]  [place], [place] [address]  Main: [phone]  Fax: [phone]    Gastroenterology Consultation  Referring Provider:     [name], MD Primary Care Physician:  [name], MD Primary Gastroenterologist:  Dr. Cephas [place] Reason for Consultation: Chronic constipation, rectal bleeding        HPI:   Laurie Harris is a 81 y.o. female referred by Dr. Caryl Bis, Angela Adam, MD  for consultation & management of chronic constipation and rectal bleeding.  Patient reports that for more than 3 months, she has been experiencing painless rectal bleeding, bright red blood per rectum on wiping and sometimes in the toilet bowl.  Patient denies any abdominal pain, nausea or vomiting, diarrhea.  Patient has not tried any hemorrhoidal creams.  Patient is accompanied by her daughter today.  Patient reports that she has bowel movement every day and she does not push or strain, spends about 5 minutes on the toilet.  She appeared bloated when I saw her, however she denied.  Labs revealed normal hemoglobin A1c, LFTs.  She had normal iron panel, B12 and folate levels. She had a CT abdomen and pelvis with contrast 12/08/2019 which revealed severe colonic diverticulosis with acute sigmoid diverticulitis with no evidence of abscess or perforation. The patient's daughter, she was supposed to take stool softener, patient does not take it daily  NSAIDs: None  Antiplts/Anticoagulants/Anti thrombotics: None  GI Procedures: Colonoscopy 02/05/2014 reportedly normal, Colonoscopy 06/02/2009 internal hemorrhoids and pancolonic diverticulosis  Past Medical History:  Diagnosis Date   Carotid arterial disease ([place])    a. 09/2016 Carotid U/S: R carotid bifurcation dzs of 50-69%, L carotid bifurcation dzs of <50%. Patent vertebral arteries w/ anegrade flow.   Cerebellar hemorrhage ([place]) 03/16/2015   Left Cerebellar Hemorrhage- See Care Everywhere Seen by [facility]  Stroke Clinic     Chicken pox    CKD (chronic kidney disease), stage III (HCC)    functioning at 46%   Diabetes mellitus without complication ([place])    Hemorrhagic stroke ([place]) 12/31/2014   a. in the setting of warfarin therapy.   History of appendicitis 08/07/2016   Hyperlipidemia    Hypertension    Syncope    a. 09/2016 Echo: EF 55-60%, no rwma, Gr1 DD.    Past Surgical History:  Procedure Laterality Date   ABCESS DRAINAGE  06/14/2016   appendix   APPENDECTOMY  06/13/2016   CATARACT EXTRACTION W/PHACO Left 09/04/2017   Procedure: CATARACT EXTRACTION PHACO AND INTRAOCULAR LENS PLACEMENT ([place]) COMPLICATED LEFT DIABETIC;  Surgeon: Leandrew Koyanagi, MD;  Location: [place];  Service: Ophthalmology;  Laterality: Left;  Diabetic - insulin   CATARACT EXTRACTION W/PHACO Right 10/02/2017   Procedure: CATARACT EXTRACTION PHACO AND INTRAOCULAR LENS PLACEMENT ([place]) COMPLICATED  RIGHT DIABETIC;  Surgeon: Leandrew Koyanagi, MD;  Location: [place];  Service: Ophthalmology;  Laterality: Right;  Diabetic - insulin   CHOLECYSTECTOMY N/A 06/17/2018   Procedure: LAPAROSCOPIC CHOLECYSTECTOMY;  Surgeon: Benjamine Sprague, DO;  Location: [facility];  Service: General;  Laterality: N/A;   COLONOSCOPY     NECK SURGERY       Current Outpatient Medications:    aspirin EC 81 MG tablet, Take 81 mg by mouth daily., Disp: , Rfl:    BD AUTOSHIELD DUO 30G X 5 MM MISC, USE TWICE DAILY AS DIRECTED, Disp: 100 each, Rfl: 1   benazepril (LOTENSIN) 5 MG tablet, TAKE ONE TABLET EVERY DAY, Disp:  90 tablet, Rfl: 2   calcium carbonate (OS-CAL) 600 MG TABS tablet, Take one tablet by mouth once a day., Disp: 30 tablet, Rfl: 10   Cholecalciferol (VITAMIN D3) 50 MCG (2000 UT) capsule, Take one tablet by mouth daily. , Disp: 30 capsule, Rfl: 10   docusate sodium (COLACE) 100 MG capsule, Take 1 capsule (100 mg total) by mouth 2 (two) times daily as needed for mild constipation., Disp: 10 capsule, Rfl: 0    fenofibrate 160 MG tablet, Take 1 tablet (160 mg total) by mouth daily., Disp: 90 tablet, Rfl: 1   HUMALOG MIX 75/25 KWIKPEN (75-25) 100 UNIT/ML KwikPen, INJECT 14 UNITS SUBCUTANEOUSLY TWICE A DAY WITH A MEAL., Disp: 15 mL, Rfl: 10   hydrocortisone (ANUSOL-HC) 2.5 % rectal cream, Place 1 Application rectally 2 (two) times daily., Disp: 30 g, Rfl: 1   insulin lispro protamine-lispro (HUMALOG 75/25 MIX) (75-25) 100 UNIT/ML SUSP injection, Inject 14 Units into the skin 2 (two) times daily with a meal., Disp: 10 mL, Rfl: 0   Multiple Vitamin (MULTIVITAMIN WITH MINERALS) TABS tablet, Take 1 tablet by mouth daily., Disp: , Rfl:    NOVOFINE PLUS 32G X 4 MM MISC, USE TWICE A DAY AS DIRECTED, Disp: 100 each, Rfl: 6   pantoprazole (PROTONIX) 40 MG tablet, TAKE 1 TABLET BY MOUTH EVERY-OTHER-DAY, Disp: 15 tablet, Rfl: 10   rosuvastatin (CRESTOR) 40 MG tablet, TAKE (1) TABLET BY MOUTH ONCE A DAY, Disp: 30 tablet, Rfl: 10   triamcinolone cream (KENALOG) 0.1 %, Apply 1 application topically 2 (two) times daily., Disp: 30 g, Rfl: 0   Family History  Problem Relation Age of Onset   Diabetes Mother    Dementia Mother    Heart disease Mother    Cancer Mother        lung   Cancer Father        colon   Stroke Daughter    Hyperlipidemia Daughter    Breast cancer Neg Hx      Social History   Tobacco Use   Smoking status: Never   Smokeless tobacco: Never  Vaping Use   Vaping Use: Never used  Substance Use Topics   Alcohol use: No    Alcohol/week: 0.0 standard drinks of alcohol   Drug use: No    Allergies as of 05/03/2022 - Review Complete 05/03/2022  Allergen Reaction Noted   Warfarin and related  07/01/2017    Review of Systems:    All systems reviewed and negative except where noted in HPI.   Physical Exam:  BP (!) 163/82 (BP Location: Left Arm, Patient Position: Sitting, Cuff Size: Normal)   Pulse 79   Temp 97.7 F (36.5 C) (Oral)   Ht '5\' 6"'$  (1.676 m)   Wt 117 lb 8 oz (53.3 kg)   BMI  18.96 kg/m  No LMP recorded. Patient is postmenopausal.  General:   Alert,  Well-developed, well-nourished, pleasant and cooperative in NAD Head:  Normocephalic and atraumatic. Eyes:  Sclera clear, no icterus.   Conjunctiva pink. Ears:  Normal auditory acuity. Nose:  No deformity, discharge, or lesions. Mouth:  No deformity or lesions,oropharynx pink & moist. Neck:  Supple; no masses or thyromegaly. Lungs:  Respirations even and unlabored.  Clear throughout to auscultation.   No wheezes, crackles, or rhonchi. No acute distress. Heart:  Regular rate and rhythm; no murmurs, clicks, rubs, or gallops. Abdomen:  Normal bowel sounds. Soft, non-tender and mildly distended, tympanic without masses, hepatosplenomegaly or hernias noted.  No  guarding or rebound tenderness.   Rectal: Cluster of perianal skin tags, digital rectal exam revealed hard stool pellets, brown, nontender, small hemorrhoids were appreciated Msk:  Symmetrical without gross deformities. Good, equal movement & strength bilaterally. Pulses:  Normal pulses noted. Extremities:  No clubbing or edema.  No cyanosis. Neurologic:  Alert and oriented x3;  grossly normal neurologically. Skin:  Intact without significant lesions or rashes. No jaundice. Psych:  Alert and cooperative. Normal mood and affect.  Imaging Studies: Read reviewed  Assessment and Plan:   HANEEFAH VENTURINI is a 81 y.o. female with history of dementia, pancolonic diverticulosis, diabetes, hypertension, hyperlipidemia is seen in consultation for painless rectal bleeding and chronic constipation.  Rectal bleeding is secondary to hemorrhoids in setting of chronic constipation Advised patient to start taking MiraLAX 1 capful daily Start fiber supplement such as Benefiber or Citrucel once a day Recommend to treat hemorrhoids with Anusol cream pea-sized per rectum twice daily for 7 to 10 days Defer hemorrhoid ligation at this time unless rectal bleeding is  persistent   Follow up as needed   Laurie Darby, MD

## 2022-05-04 ENCOUNTER — Encounter: Payer: Self-pay | Admitting: Nurse Practitioner

## 2022-05-04 ENCOUNTER — Ambulatory Visit: Payer: Medicare Other | Attending: Nurse Practitioner | Admitting: Cardiology

## 2022-05-04 VITALS — BP 134/60 | HR 67 | Ht 66.0 in | Wt 118.0 lb

## 2022-05-04 DIAGNOSIS — I10 Essential (primary) hypertension: Secondary | ICD-10-CM | POA: Diagnosis not present

## 2022-05-04 DIAGNOSIS — Z87898 Personal history of other specified conditions: Secondary | ICD-10-CM | POA: Insufficient documentation

## 2022-05-04 DIAGNOSIS — I6523 Occlusion and stenosis of bilateral carotid arteries: Secondary | ICD-10-CM | POA: Insufficient documentation

## 2022-05-04 DIAGNOSIS — I700 Atherosclerosis of aorta: Secondary | ICD-10-CM

## 2022-05-04 DIAGNOSIS — E785 Hyperlipidemia, unspecified: Secondary | ICD-10-CM | POA: Diagnosis not present

## 2022-05-04 DIAGNOSIS — I708 Atherosclerosis of other arteries: Secondary | ICD-10-CM

## 2022-05-04 NOTE — Patient Instructions (Addendum)
Medication Instructions:  Your Physician recommend you continue on your current medication as directed.    *If you need a refill on your cardiac medications before your next appointment, please call your pharmacy*   Lab Work: None ordered today   Testing/Procedures: Your physician has requested that you have a carotid duplex. This test is an ultrasound of the carotid arteries in your neck. It looks at blood flow through these arteries that supply the brain with blood. Allow one hour for this exam. There are no restrictions or special instructions. [facility] [place]   Follow-Up: At [facility], you and your health needs are our priority.  As part of our continuing mission to provide you with exceptional heart care, we have created designated Provider Care Teams.  These Care Teams include your primary Cardiologist (physician) and Advanced Practice Providers (APPs -  Physician Assistants and Nurse Practitioners) who all work together to provide you with the care you need, when you need it.  We recommend signing up for the patient portal called "MyChart".  Sign up information is provided on this After Visit Summary.  MyChart is used to connect with patients for Virtual Visits (Telemedicine).  Patients are able to view lab/test results, encounter notes, upcoming appointments, etc.  Non-urgent messages can be sent to your provider as well.   To learn more about what you can do with MyChart, go to [email or web address].    Your next appointment:   6-7 month(s)  The format for your next appointment:   In Person  Provider:   Dr. Fletcher Anon or APP

## 2022-05-04 NOTE — Progress Notes (Signed)
Cardiology Clinic Note   Patient Name: Laurie Harris Date of Encounter: 05/04/2022  Primary Care Provider:  [name], MD Primary Cardiologist:  Kathlyn Sacramento, MD  Patient Profile    81 year old female with a history of recurrent syncopal episodes felt to be secondary to vasovagal and another secondary to dehydration hypoglycemia, hemorrhagic CVA while on warfarin with residual left-sided weakness and gait instability in 2016, moderate bilateral carotid artery stenosis, G1 DD, hypertension, hyperlipidemia, diabetes type 2, CKD stage III followed by nephrology, who is here today for follow-up.  Past Medical History    Past Medical History:  Diagnosis Date   Carotid arterial disease ([place])    a. 09/2016 Carotid U/S: R carotid bifurcation dzs of 50-69%, L carotid bifurcation dzs of <50%. Patent vertebral arteries w/ anegrade flow.   Cerebellar hemorrhage ([place]) 03/16/2015   Left Cerebellar Hemorrhage- See Care Everywhere Seen by [facility]     Chicken pox    CKD (chronic kidney disease), stage III (HCC)    functioning at 46%   Diabetes mellitus without complication ([place])    Hemorrhagic stroke ([place]) 12/31/2014   a. in the setting of warfarin therapy.   History of appendicitis 08/07/2016   Hyperlipidemia    Hypertension    Syncope    a. 09/2016 Echo: EF 55-60%, no rwma, Gr1 DD.   Past Surgical History:  Procedure Laterality Date   ABCESS DRAINAGE  06/14/2016   appendix   APPENDECTOMY  06/13/2016   CATARACT EXTRACTION W/PHACO Left 09/04/2017   Procedure: CATARACT EXTRACTION PHACO AND INTRAOCULAR LENS PLACEMENT ([place]) COMPLICATED LEFT DIABETIC;  Surgeon: Leandrew Koyanagi, MD;  Location: [place];  Service: Ophthalmology;  Laterality: Left;  Diabetic - insulin   CATARACT EXTRACTION W/PHACO Right 10/02/2017   Procedure: CATARACT EXTRACTION PHACO AND INTRAOCULAR LENS PLACEMENT ([place]) COMPLICATED  RIGHT DIABETIC;  Surgeon: Leandrew Koyanagi, MD;   Location: [place];  Service: Ophthalmology;  Laterality: Right;  Diabetic - insulin   CHOLECYSTECTOMY N/A 06/17/2018   Procedure: LAPAROSCOPIC CHOLECYSTECTOMY;  Surgeon: Benjamine Sprague, DO;  Location: [facility];  Service: General;  Laterality: N/A;   COLONOSCOPY     NECK SURGERY      Allergies  Allergies  Allergen Reactions   Warfarin And Related     Possible stroke    History of Present Illness    81 year old female with a past medical history as above.  She has a history of syncope attributed to vasovagal, dehydration, and hyperglycemia in the past (2016, 2018).  She had history of remittent brown few-year-old liver scan in 2018.  2018 echocardiogram revealed normal EF, G1 DD.  09/2019 carotid ultrasound showed ICA with less than 50% stenosis bilaterally.  Repeat echocardiogram done 09/18/2019 revealed LVEF of 60 to 65%, mildly increased left ventricular hypertrophy, G1 DD, no regional wall motion abnormalities, and mild/moderate aortic valve sclerosis.  She was last hospitalized in 12/2019, she was [facility] to [facility] ED with report of the left lower quadrant abdominal pain.  CT scan shows diverticulum lysis with an acute sigmoid diverticulitis.  No diverticular abscess or perforation was noted.  Also reported with constipation, as well as aortic atherosclerosis.  EKG without acute findings.  She was prescribed Augmentin and discharged home first dose of Augmentin received in the emergency department.  She was last seen in clinic 5/522 by Dr. Fletcher Anon, where she appeared to be doing reasonably well.  She denied any chest pain, shortness of breath, dizziness, or syncopal episodes.  There were no medication changes that were made to her regimen.  She returns to clinic today accompanied by her daughter stating that she has been doing fairly well.  She denies any chest pain, shortness of breath, dizziness, presyncope/syncope, peripheral edema, or claudication.  She has not had any  hospitalizations since her last hospitalization in 2021 with diverticulitis.  She does continue to reside at [facility].   Home Medications    Current Outpatient Medications  Medication Sig Dispense Refill   aspirin EC 81 MG tablet Take 81 mg by mouth daily.     BD AUTOSHIELD DUO 30G X 5 MM MISC USE TWICE DAILY AS DIRECTED 100 each 1   benazepril (LOTENSIN) 5 MG tablet TAKE ONE TABLET EVERY DAY 90 tablet 2   calcium carbonate (OS-CAL) 600 MG TABS tablet Take one tablet by mouth once a day. 30 tablet 10   Cholecalciferol (VITAMIN D3) 50 MCG (2000 UT) capsule Take one tablet by mouth daily.  30 capsule 10   docusate sodium (COLACE) 100 MG capsule Take 1 capsule (100 mg total) by mouth 2 (two) times daily as needed for mild constipation. 10 capsule 0   fenofibrate 160 MG tablet Take 1 tablet (160 mg total) by mouth daily. 90 tablet 1   HUMALOG MIX 75/25 KWIKPEN (75-25) 100 UNIT/ML KwikPen INJECT 14 UNITS SUBCUTANEOUSLY TWICE A DAY WITH A MEAL. 15 mL 10   hydrocortisone (ANUSOL-HC) 2.5 % rectal cream Place 1 Application rectally 2 (two) times daily. 30 g 1   insulin lispro protamine-lispro (HUMALOG 75/25 MIX) (75-25) 100 UNIT/ML SUSP injection Inject 14 Units into the skin 2 (two) times daily with a meal. 10 mL 0   Multiple Vitamin (MULTIVITAMIN WITH MINERALS) TABS tablet Take 1 tablet by mouth daily.     NOVOFINE PLUS 32G X 4 MM MISC USE TWICE A DAY AS DIRECTED 100 each 6   pantoprazole (PROTONIX) 40 MG tablet TAKE 1 TABLET BY MOUTH EVERY-OTHER-DAY 15 tablet 10   rosuvastatin (CRESTOR) 40 MG tablet TAKE (1) TABLET BY MOUTH ONCE A DAY 30 tablet 10   triamcinolone cream (KENALOG) 0.1 % Apply 1 application topically 2 (two) times daily. (Patient not taking: Reported on 05/04/2022) 30 g 0   No current facility-administered medications for this visit.     Family History    Family History  Problem Relation Age of Onset   Diabetes Mother    Dementia Mother    Heart disease Mother    Cancer  Mother        lung   Cancer Father        colon   Stroke Daughter    Hyperlipidemia Daughter    Breast cancer Neg Hx    She indicated that her mother is deceased. She indicated that her father is deceased. She indicated that her daughter is alive. She indicated that the status of her neg hx is unknown.  Social History    Social History   Socioeconomic History   Marital status: Widowed    Spouse name: Not on file   Number of children: Not on file   Years of education: Not on file   Highest education level: Not on file  Occupational History   Not on file  Tobacco Use   Smoking status: Never   Smokeless tobacco: Never  Vaping Use   Vaping Use: Never used  Substance and Sexual Activity   Alcohol use: No    Alcohol/week: 0.0 standard drinks of alcohol  Drug use: No   Sexual activity: Not on file  Other Topics Concern   Not on file  Social History Narrative   Not on file   Social Determinants of Health   Financial Resource Strain: Low Risk  (07/13/2021)   Overall Financial Resource Strain (CARDIA)    Difficulty of Paying Living Expenses: Not hard at all  Food Insecurity: No Food Insecurity (07/13/2021)   Hunger Vital Sign    Worried About Running Out of Food in the Last Year: Never true    Ran Out of Food in the Last Year: Never true  Transportation Needs: No Transportation Needs (07/13/2021)   PRAPARE - Hydrologist (Medical): No    Lack of Transportation (Non-Medical): No  Physical Activity: Insufficiently Active (07/13/2021)   Exercise Vital Sign    Days of Exercise per Week: 6 days    Minutes of Exercise per Session: 20 min  Stress: No Stress Concern Present (07/13/2021)   [place]    Feeling of Stress : Not at all  Social Connections: Moderately Integrated (07/13/2021)   Social Connection and Isolation Panel [NHANES]    Frequency of Communication with Friends and  Family: More than three times a week    Frequency of Social Gatherings with Friends and Family: More than three times a week    Attends Religious Services: 1 to 4 times per year    Active Member of [employer] or Organizations: Yes    Attends Archivist Meetings: Never    Marital Status: Widowed  Intimate Partner Violence: Not At Risk (07/13/2021)   Humiliation, Afraid, Rape, and Kick questionnaire    Fear of Current or Ex-Partner: No    Emotionally Abused: No    Physically Abused: No    Sexually Abused: No     Review of Systems    General:  No chills, fever, night sweats or weight changes.  Endorses exertional fatigue Cardiovascular:  No chest pain, dyspnea on exertion, edema, orthopnea, palpitations, paroxysmal nocturnal dyspnea. Dermatological: No rash, lesions/masses Respiratory: No cough, dyspnea Urologic: No hematuria, dysuria Abdominal:   No nausea, vomiting, diarrhea, bright red blood per rectum, melena, or hematemesis Neurologic:  No visual changes, wkns, changes in mental status. All other systems reviewed and are otherwise negative except as noted above.   Physical Exam    VS:  BP 134/60 (BP Location: Left Arm, Patient Position: Sitting, Cuff Size: Normal)   Pulse 67   Ht '5\' 6"'$  (1.676 m)   Wt 118 lb (53.5 kg)   SpO2 98%   BMI 19.05 kg/m  , BMI Body mass index is 19.05 kg/m.     GEN: Well nourished, well developed, in no acute distress. HEENT: normal. Neck: Supple, no JVD, carotid bruits, or masses. Cardiac: RRR, I/VI murmur best heard at the RSB, without rubs, or gallops. No clubbing, cyanosis, trace edema.  Radials/DP/PT 2+ and equal bilaterally.  Respiratory:  Respirations regular and unlabored, clear to auscultation bilaterally. GI: Soft, nontender, nondistended, BS + x 4. MS: no deformity or atrophy. Skin: warm and dry, no rash. Neuro:  Strength and sensation are intact. Psych: Normal affect.  Accessory Clinical Findings    ECG personally reviewed  by me today-sinus rhythm with a rate of 67, left axis deviation- No acute changes  Lab Results  Component Value Date   WBC 5.1 04/25/2022   HGB 12.5 04/25/2022   HCT 36.5 04/25/2022   MCV 99.2  04/25/2022   PLT 136.0 (L) 04/25/2022   Lab Results  Component Value Date   CREATININE 1.05 04/25/2022   BUN 27 (H) 04/25/2022   NA 140 04/25/2022   K 3.7 04/25/2022   CL 109 04/25/2022   CO2 23 04/25/2022   Lab Results  Component Value Date   ALT 24 12/19/2021   AST 29 12/19/2021   ALKPHOS 94 12/19/2021   BILITOT 0.8 12/19/2021   Lab Results  Component Value Date   CHOL 129 12/19/2021   HDL 36.80 (L) 12/19/2021   LDLCALC 52 11/05/2019   LDLDIRECT 62.0 12/19/2021   TRIG 226.0 (H) 12/19/2021   CHOLHDL 4 12/19/2021    Lab Results  Component Value Date   HGBA1C 6.5 04/25/2022    Assessment & Plan   1.  Hypertension, blood pressure today 130/60.  She continues to have a blood pressure taken at the facility 2 hours after she takes her medication.  She has been continued on benazepril 5 mg daily.  There were no changes made to her medications today.  2.  Bilateral carotid disease with her most recent carotid Doppler completed in February of last year which showed stable 40 to 59% right carotid stenosis.  She has been ordered for a carotid duplex to follow-up on her carotid disease.  She is also been advised to continue with her low-dose aspirin 81 mg daily fenofibrate 160 mg daily and rosuvastatin 40 mg daily.  There were no changes made to her medications today.  3.  Hyperlipidemia with an LDL of 62/18/23.  She is to continue with the fenofibrate 160 mg daily and rosuvastatin 40 mg daily.  This continues to be followed by her PCP.  4.  History of syncope with no recent episodes.  5.  Diabetes with a last hemoglobin A1c of 6.5 on 04/25/2022.  This continues to be followed by PCP.  6.  Disposition patient to return to clinic to follow-up with MD/APP in 6 months or sooner if  needed.  Joaopedro Eschbach, NP 05/04/2022, 11:17 AM

## 2022-05-10 ENCOUNTER — Telehealth: Payer: Self-pay

## 2022-05-10 NOTE — Telephone Encounter (Signed)
Do we know the reason why she does not want to take MiraLAX daily?  Is it because she is having diarrhea from MiraLAX or she does not like the medication itself?  Please find out in detail  Thanks RV

## 2022-05-10 NOTE — Telephone Encounter (Signed)
Called the nursing home and left a message for nurse

## 2022-05-10 NOTE — Telephone Encounter (Signed)
The nursing home [facility] sent [name] a fax that patient is refusing to take Miralax daily  FYI

## 2022-05-14 NOTE — Telephone Encounter (Signed)
Patients nursing home called and stated that the patient will not take the miralax because she thinks its making her constipated and that it does nothing for her but she is using the rectal cream and is taking the dietary fiber but just will not take the miralax. Wants to know if there is something else she can take other than Miralax. Nursing home requesting a call back at [phone]. I spoke with [name].

## 2022-05-14 NOTE — Telephone Encounter (Signed)
Dr. Marius Ditch, can you please advise what else patient could try taking for her constipation? Please advise.

## 2022-05-15 NOTE — Telephone Encounter (Signed)
Let's start her on Linzess 72 mcg daily, please send in prescription for 1 month.  If this is not helping, the nursing home should let us know within a week  Thanks RV

## 2022-05-15 NOTE — Telephone Encounter (Signed)
Patient states the Miralax makes her dizzy and she states she is not constipated. She states she is not going to take any medication.  She states she has a normal bowel movement daily. Called nursing home and informed patient

## 2022-05-22 ENCOUNTER — Encounter: Payer: Self-pay | Admitting: Family Medicine

## 2022-05-23 ENCOUNTER — Telehealth: Payer: Self-pay

## 2022-05-23 DIAGNOSIS — Z111 Encounter for screening for respiratory tuberculosis: Secondary | ICD-10-CM

## 2022-05-23 NOTE — Telephone Encounter (Signed)
Ordered

## 2022-05-23 NOTE — Telephone Encounter (Signed)
I called and spoke with the patient and her daughter and they would like to do the Quantiferon  Gold TB test.  She is scheduled we just need the order placed.  Kambry Takacs,cma

## 2022-05-23 NOTE — Telephone Encounter (Signed)
Linda from [employer] is calling and states the patient is refusing the Miralax and Citrucel  and will not take it. She states she needs [name] to fax them a Discontinue order saying that this is okay to do. Fax to [phone]. Typed up a order and faxed to [facility]

## 2022-05-23 NOTE — Telephone Encounter (Signed)
She needs a TB skin test completed or we can do the blood TB test if the patient would prefer that. Once completed I can sign off on her forms.

## 2022-05-23 NOTE — Telephone Encounter (Signed)
Patient is moving to a new facility and the [place]@ and other forms is in the sign basket for your to sign.  Jerianne Anselmo,cma

## 2022-05-25 NOTE — Telephone Encounter (Signed)
Letter completed. Sent to patient through mychart.

## 2022-05-28 DIAGNOSIS — E1140 Type 2 diabetes mellitus with diabetic neuropathy, unspecified: Secondary | ICD-10-CM | POA: Diagnosis not present

## 2022-05-28 DIAGNOSIS — L600 Ingrowing nail: Secondary | ICD-10-CM | POA: Diagnosis not present

## 2022-05-28 DIAGNOSIS — M2041 Other hammer toe(s) (acquired), right foot: Secondary | ICD-10-CM | POA: Diagnosis not present

## 2022-05-28 DIAGNOSIS — M2042 Other hammer toe(s) (acquired), left foot: Secondary | ICD-10-CM | POA: Diagnosis not present

## 2022-05-28 DIAGNOSIS — M79672 Pain in left foot: Secondary | ICD-10-CM | POA: Diagnosis not present

## 2022-05-28 DIAGNOSIS — M79671 Pain in right foot: Secondary | ICD-10-CM | POA: Diagnosis not present

## 2022-05-29 ENCOUNTER — Other Ambulatory Visit (INDEPENDENT_AMBULATORY_CARE_PROVIDER_SITE_OTHER): Payer: Medicare Other

## 2022-05-29 DIAGNOSIS — Z111 Encounter for screening for respiratory tuberculosis: Secondary | ICD-10-CM | POA: Diagnosis not present

## 2022-06-03 LAB — QUANTIFERON-TB GOLD PLUS
Mitogen-NIL: 7.47 IU/mL
NIL: 0.02 IU/mL
QuantiFERON-TB Gold Plus: NEGATIVE
TB1-NIL: 0 IU/mL
TB2-NIL: 0 IU/mL

## 2022-06-08 ENCOUNTER — Observation Stay: Payer: Medicare Other | Admitting: Certified Registered"

## 2022-06-08 ENCOUNTER — Ambulatory Visit (INDEPENDENT_AMBULATORY_CARE_PROVIDER_SITE_OTHER): Payer: Medicare Other | Admitting: Internal Medicine

## 2022-06-08 ENCOUNTER — Other Ambulatory Visit: Payer: Self-pay

## 2022-06-08 ENCOUNTER — Emergency Department: Payer: Medicare Other

## 2022-06-08 ENCOUNTER — Encounter
Admission: EM | Disposition: A | Discharge status: Skilled Nursing Facility | Payer: Self-pay | Source: Ambulatory Visit | Attending: Internal Medicine

## 2022-06-08 ENCOUNTER — Encounter: Payer: Self-pay | Admitting: Internal Medicine

## 2022-06-08 ENCOUNTER — Inpatient Hospital Stay
Admission: EM | Admit: 2022-06-08 | Discharge: 2022-06-12 | DRG: 398 | Disposition: A | Discharge status: Skilled Nursing Facility | Payer: Medicare Other | Source: Ambulatory Visit | Attending: Internal Medicine | Admitting: Internal Medicine

## 2022-06-08 DIAGNOSIS — Z800 Family history of malignant neoplasm of digestive organs: Secondary | ICD-10-CM

## 2022-06-08 DIAGNOSIS — F320 Major depressive disorder, single episode, mild: Secondary | ICD-10-CM | POA: Diagnosis present

## 2022-06-08 DIAGNOSIS — K5900 Constipation, unspecified: Secondary | ICD-10-CM

## 2022-06-08 DIAGNOSIS — M810 Age-related osteoporosis without current pathological fracture: Secondary | ICD-10-CM | POA: Diagnosis present

## 2022-06-08 DIAGNOSIS — E7800 Pure hypercholesterolemia, unspecified: Secondary | ICD-10-CM

## 2022-06-08 DIAGNOSIS — I129 Hypertensive chronic kidney disease with stage 1 through stage 4 chronic kidney disease, or unspecified chronic kidney disease: Secondary | ICD-10-CM | POA: Diagnosis present

## 2022-06-08 DIAGNOSIS — E119 Type 2 diabetes mellitus without complications: Secondary | ICD-10-CM | POA: Diagnosis not present

## 2022-06-08 DIAGNOSIS — R1031 Right lower quadrant pain: Secondary | ICD-10-CM | POA: Diagnosis not present

## 2022-06-08 DIAGNOSIS — K219 Gastro-esophageal reflux disease without esophagitis: Secondary | ICD-10-CM | POA: Diagnosis present

## 2022-06-08 DIAGNOSIS — K3532 Acute appendicitis with perforation and localized peritonitis, without abscess: Principal | ICD-10-CM

## 2022-06-08 DIAGNOSIS — I10 Essential (primary) hypertension: Secondary | ICD-10-CM

## 2022-06-08 DIAGNOSIS — F0393 Unspecified dementia, unspecified severity, with mood disturbance: Secondary | ICD-10-CM | POA: Diagnosis present

## 2022-06-08 DIAGNOSIS — R109 Unspecified abdominal pain: Secondary | ICD-10-CM | POA: Diagnosis not present

## 2022-06-08 DIAGNOSIS — R1312 Dysphagia, oropharyngeal phase: Secondary | ICD-10-CM | POA: Diagnosis not present

## 2022-06-08 DIAGNOSIS — K7460 Unspecified cirrhosis of liver: Secondary | ICD-10-CM | POA: Diagnosis not present

## 2022-06-08 DIAGNOSIS — Z8249 Family history of ischemic heart disease and other diseases of the circulatory system: Secondary | ICD-10-CM | POA: Diagnosis not present

## 2022-06-08 DIAGNOSIS — Z823 Family history of stroke: Secondary | ICD-10-CM

## 2022-06-08 DIAGNOSIS — E11319 Type 2 diabetes mellitus with unspecified diabetic retinopathy without macular edema: Secondary | ICD-10-CM | POA: Diagnosis present

## 2022-06-08 DIAGNOSIS — K37 Unspecified appendicitis: Secondary | ICD-10-CM | POA: Diagnosis present

## 2022-06-08 DIAGNOSIS — K760 Fatty (change of) liver, not elsewhere classified: Secondary | ICD-10-CM | POA: Diagnosis not present

## 2022-06-08 DIAGNOSIS — R1084 Generalized abdominal pain: Secondary | ICD-10-CM | POA: Diagnosis not present

## 2022-06-08 DIAGNOSIS — F05 Delirium due to known physiological condition: Secondary | ICD-10-CM | POA: Diagnosis not present

## 2022-06-08 DIAGNOSIS — Z801 Family history of malignant neoplasm of trachea, bronchus and lung: Secondary | ICD-10-CM | POA: Diagnosis not present

## 2022-06-08 DIAGNOSIS — E785 Hyperlipidemia, unspecified: Secondary | ICD-10-CM | POA: Diagnosis present

## 2022-06-08 DIAGNOSIS — R2689 Other abnormalities of gait and mobility: Secondary | ICD-10-CM | POA: Diagnosis not present

## 2022-06-08 DIAGNOSIS — N1831 Chronic kidney disease, stage 3a: Secondary | ICD-10-CM | POA: Diagnosis not present

## 2022-06-08 DIAGNOSIS — Z79899 Other long term (current) drug therapy: Secondary | ICD-10-CM

## 2022-06-08 DIAGNOSIS — K353 Acute appendicitis with localized peritonitis, without perforation or gangrene: Secondary | ICD-10-CM

## 2022-06-08 DIAGNOSIS — I779 Disorder of arteries and arterioles, unspecified: Secondary | ICD-10-CM | POA: Diagnosis present

## 2022-06-08 DIAGNOSIS — Z66 Do not resuscitate: Secondary | ICD-10-CM | POA: Diagnosis present

## 2022-06-08 DIAGNOSIS — R278 Other lack of coordination: Secondary | ICD-10-CM | POA: Diagnosis not present

## 2022-06-08 DIAGNOSIS — M6281 Muscle weakness (generalized): Secondary | ICD-10-CM | POA: Diagnosis not present

## 2022-06-08 DIAGNOSIS — R41841 Cognitive communication deficit: Secondary | ICD-10-CM | POA: Diagnosis not present

## 2022-06-08 DIAGNOSIS — I708 Atherosclerosis of other arteries: Secondary | ICD-10-CM | POA: Diagnosis not present

## 2022-06-08 DIAGNOSIS — Z833 Family history of diabetes mellitus: Secondary | ICD-10-CM

## 2022-06-08 DIAGNOSIS — N179 Acute kidney failure, unspecified: Secondary | ICD-10-CM | POA: Diagnosis not present

## 2022-06-08 DIAGNOSIS — I700 Atherosclerosis of aorta: Secondary | ICD-10-CM

## 2022-06-08 DIAGNOSIS — D6959 Other secondary thrombocytopenia: Secondary | ICD-10-CM | POA: Diagnosis present

## 2022-06-08 DIAGNOSIS — E1122 Type 2 diabetes mellitus with diabetic chronic kidney disease: Secondary | ICD-10-CM | POA: Diagnosis present

## 2022-06-08 DIAGNOSIS — K3580 Unspecified acute appendicitis: Principal | ICD-10-CM

## 2022-06-08 DIAGNOSIS — Z8719 Personal history of other diseases of the digestive system: Secondary | ICD-10-CM

## 2022-06-08 DIAGNOSIS — K7030 Alcoholic cirrhosis of liver without ascites: Secondary | ICD-10-CM | POA: Diagnosis not present

## 2022-06-08 DIAGNOSIS — F03918 Unspecified dementia, unspecified severity, with other behavioral disturbance: Secondary | ICD-10-CM | POA: Diagnosis not present

## 2022-06-08 DIAGNOSIS — Z794 Long term (current) use of insulin: Secondary | ICD-10-CM | POA: Diagnosis not present

## 2022-06-08 DIAGNOSIS — I69254 Hemiplegia and hemiparesis following other nontraumatic intracranial hemorrhage affecting left non-dominant side: Secondary | ICD-10-CM | POA: Diagnosis not present

## 2022-06-08 DIAGNOSIS — K859 Acute pancreatitis without necrosis or infection, unspecified: Secondary | ICD-10-CM | POA: Diagnosis not present

## 2022-06-08 DIAGNOSIS — R000 Tachycardia, unspecified: Secondary | ICD-10-CM | POA: Diagnosis not present

## 2022-06-08 DIAGNOSIS — M6259 Muscle wasting and atrophy, not elsewhere classified, multiple sites: Secondary | ICD-10-CM | POA: Diagnosis not present

## 2022-06-08 HISTORY — PX: LAPAROSCOPIC APPENDECTOMY: SHX408

## 2022-06-08 LAB — URINALYSIS, ROUTINE W REFLEX MICROSCOPIC
Bilirubin Urine: NEGATIVE
Glucose, UA: 50 mg/dL — AB
Hgb urine dipstick: NEGATIVE
Ketones, ur: NEGATIVE mg/dL
Leukocytes,Ua: NEGATIVE
Nitrite: NEGATIVE
Protein, ur: NEGATIVE mg/dL
Specific Gravity, Urine: 1.026 (ref 1.005–1.030)
pH: 5 (ref 5.0–8.0)

## 2022-06-08 LAB — CBC
HCT: 40.6 % (ref 36.0–46.0)
Hemoglobin: 13.2 g/dL (ref 12.0–15.0)
MCH: 32.1 pg (ref 26.0–34.0)
MCHC: 32.5 g/dL (ref 30.0–36.0)
MCV: 98.8 fL (ref 80.0–100.0)
Platelets: 138 10*3/uL — ABNORMAL LOW (ref 150–400)
RBC: 4.11 MIL/uL (ref 3.87–5.11)
RDW: 14.8 % (ref 11.5–15.5)
WBC: 10.7 10*3/uL — ABNORMAL HIGH (ref 4.0–10.5)
nRBC: 0 % (ref 0.0–0.2)

## 2022-06-08 LAB — COMPREHENSIVE METABOLIC PANEL
ALT: 52 U/L — ABNORMAL HIGH (ref 0–44)
AST: 68 U/L — ABNORMAL HIGH (ref 15–41)
Albumin: 3.6 g/dL (ref 3.5–5.0)
Alkaline Phosphatase: 89 U/L (ref 38–126)
Anion gap: 11 (ref 5–15)
BUN: 30 mg/dL — ABNORMAL HIGH (ref 8–23)
CO2: 22 mmol/L (ref 22–32)
Calcium: 9.9 mg/dL (ref 8.9–10.3)
Chloride: 109 mmol/L (ref 98–111)
Creatinine, Ser: 1.13 mg/dL — ABNORMAL HIGH (ref 0.44–1.00)
GFR, Estimated: 48 mL/min — ABNORMAL LOW (ref >60)
Glucose, Bld: 196 mg/dL — ABNORMAL HIGH (ref 70–99)
Potassium: 4.6 mmol/L (ref 3.5–5.1)
Sodium: 142 mmol/L (ref 135–145)
Total Bilirubin: 1.3 mg/dL — ABNORMAL HIGH (ref 0.3–1.2)
Total Protein: 7.2 g/dL (ref 6.5–8.1)

## 2022-06-08 LAB — LIPASE, BLOOD: Lipase: 33 U/L (ref 11–51)

## 2022-06-08 LAB — CBG MONITORING, ED
Glucose-Capillary: 144 mg/dL — ABNORMAL HIGH (ref 70–99)
Glucose-Capillary: 151 mg/dL — ABNORMAL HIGH (ref 70–99)

## 2022-06-08 LAB — LACTIC ACID, PLASMA: Lactic Acid, Venous: 1.4 mmol/L (ref 0.5–1.9)

## 2022-06-08 SURGERY — APPENDECTOMY, LAPAROSCOPIC
Anesthesia: General | Site: Abdomen

## 2022-06-08 MED ORDER — ACETAMINOPHEN 650 MG RE SUPP: 650.0000 mg | Freq: Four times a day (QID) | RECTAL | Status: DC | PRN

## 2022-06-08 MED ORDER — BENAZEPRIL HCL 5 MG PO TABS
5.0000 mg | ORAL_TABLET | Freq: Every day | ORAL | Status: DC
  Filled 2022-06-08 (×2): qty 1

## 2022-06-08 MED ORDER — LACTATED RINGERS IV SOLN: INTRAVENOUS | Status: AC

## 2022-06-08 MED ORDER — PHENYLEPHRINE 80 MCG/ML (10ML) SYRINGE FOR IV PUSH (FOR BLOOD PRESSURE SUPPORT)
PREFILLED_SYRINGE | INTRAVENOUS | Status: AC
  Filled 2022-06-08: qty 10

## 2022-06-08 MED ORDER — MORPHINE SULFATE (PF) 2 MG/ML IV SOLN: 2.0000 mg | INTRAVENOUS | Status: DC | PRN

## 2022-06-08 MED ORDER — ONDANSETRON HCL 4 MG/2ML IJ SOLN: 4.0000 mg | Freq: Four times a day (QID) | INTRAMUSCULAR | Status: DC | PRN

## 2022-06-08 MED ORDER — LIDOCAINE HCL (CARDIAC) PF 100 MG/5ML IV SOSY
PREFILLED_SYRINGE | INTRAVENOUS | Status: DC | PRN
  Administered 2022-06-08: 100 mg via INTRAVENOUS

## 2022-06-08 MED ORDER — SUGAMMADEX SODIUM 200 MG/2ML IV SOLN
INTRAVENOUS | Status: DC | PRN
  Administered 2022-06-08: 125 mg via INTRAVENOUS

## 2022-06-08 MED ORDER — CEFAZOLIN SODIUM-DEXTROSE 2-3 GM-%(50ML) IV SOLR
INTRAVENOUS | Status: DC | PRN
  Administered 2022-06-08: 2 g via INTRAVENOUS

## 2022-06-08 MED ORDER — MORPHINE SULFATE (PF) 2 MG/ML IV SOLN
2.0000 mg | INTRAVENOUS | Status: DC | PRN
  Administered 2022-06-08: 2 mg via INTRAVENOUS
  Filled 2022-06-08: qty 1

## 2022-06-08 MED ORDER — INSULIN ASPART 100 UNIT/ML IJ SOLN
0.0000 [IU] | Freq: Three times a day (TID) | INTRAMUSCULAR | Status: DC
  Administered 2022-06-08 – 2022-06-09 (×3): 2 [IU] via SUBCUTANEOUS
  Administered 2022-06-09: 5 [IU] via SUBCUTANEOUS
  Administered 2022-06-10 – 2022-06-11 (×4): 3 [IU] via SUBCUTANEOUS
  Administered 2022-06-11 (×2): 5 [IU] via SUBCUTANEOUS
  Administered 2022-06-12 (×2): 3 [IU] via SUBCUTANEOUS
  Filled 2022-06-08 (×12): qty 1

## 2022-06-08 MED ORDER — CEFAZOLIN SODIUM 1 G IJ SOLR
INTRAMUSCULAR | Status: AC
  Filled 2022-06-08: qty 20

## 2022-06-08 MED ORDER — 0.9 % SODIUM CHLORIDE (POUR BTL) OPTIME
TOPICAL | Status: DC | PRN
  Administered 2022-06-08: 10 mL

## 2022-06-08 MED ORDER — ACETAMINOPHEN 10 MG/ML IV SOLN
INTRAVENOUS | Status: DC | PRN
  Administered 2022-06-08: 1000 mg via INTRAVENOUS

## 2022-06-08 MED ORDER — PIPERACILLIN-TAZOBACTAM 3.375 G IVPB 30 MIN
3.3750 g | Freq: Once | INTRAVENOUS | Status: AC
  Administered 2022-06-08: 3.375 g via INTRAVENOUS
  Filled 2022-06-08: qty 50

## 2022-06-08 MED ORDER — FENTANYL CITRATE (PF) 100 MCG/2ML IJ SOLN: 25.0000 ug | INTRAMUSCULAR | Status: DC | PRN

## 2022-06-08 MED ORDER — ONDANSETRON HCL 4 MG PO TABS: 4.0000 mg | ORAL_TABLET | Freq: Four times a day (QID) | ORAL | Status: DC | PRN

## 2022-06-08 MED ORDER — ROSUVASTATIN CALCIUM 10 MG PO TABS
40.0000 mg | ORAL_TABLET | Freq: Every day | ORAL | Status: DC
  Administered 2022-06-09 – 2022-06-11 (×3): 40 mg via ORAL
  Filled 2022-06-08: qty 4
  Filled 2022-06-08: qty 2
  Filled 2022-06-08 (×2): qty 4

## 2022-06-08 MED ORDER — OXYCODONE HCL 5 MG PO TABS: 5.0000 mg | ORAL_TABLET | Freq: Once | ORAL | Status: DC | PRN

## 2022-06-08 MED ORDER — FENTANYL CITRATE (PF) 100 MCG/2ML IJ SOLN
INTRAMUSCULAR | Status: AC
  Filled 2022-06-08: qty 2

## 2022-06-08 MED ORDER — PROPOFOL 10 MG/ML IV BOLUS
INTRAVENOUS | Status: AC
  Filled 2022-06-08: qty 20

## 2022-06-08 MED ORDER — IOHEXOL 300 MG/ML  SOLN
75.0000 mL | Freq: Once | INTRAMUSCULAR | Status: AC | PRN
  Administered 2022-06-08: 75 mL via INTRAVENOUS

## 2022-06-08 MED ORDER — PHENYLEPHRINE HCL-NACL 20-0.9 MG/250ML-% IV SOLN
INTRAVENOUS | Status: AC
  Filled 2022-06-08: qty 250

## 2022-06-08 MED ORDER — PHENYLEPHRINE 80 MCG/ML (10ML) SYRINGE FOR IV PUSH (FOR BLOOD PRESSURE SUPPORT)
PREFILLED_SYRINGE | INTRAVENOUS | Status: DC | PRN
  Administered 2022-06-08 (×3): 160 ug via INTRAVENOUS

## 2022-06-08 MED ORDER — HYDRALAZINE HCL 20 MG/ML IJ SOLN: 5.0000 mg | Freq: Three times a day (TID) | INTRAMUSCULAR | Status: DC | PRN

## 2022-06-08 MED ORDER — ROCURONIUM BROMIDE 10 MG/ML (PF) SYRINGE
PREFILLED_SYRINGE | INTRAVENOUS | Status: AC
  Filled 2022-06-08: qty 10

## 2022-06-08 MED ORDER — OXYCODONE HCL 5 MG/5ML PO SOLN: 5.0000 mg | Freq: Once | ORAL | Status: DC | PRN

## 2022-06-08 MED ORDER — FENTANYL CITRATE PF 50 MCG/ML IJ SOSY
25.0000 ug | PREFILLED_SYRINGE | INTRAMUSCULAR | Status: DC | PRN
  Administered 2022-06-08: 25 ug via INTRAVENOUS
  Filled 2022-06-08: qty 1

## 2022-06-08 MED ORDER — ROCURONIUM BROMIDE 100 MG/10ML IV SOLN
INTRAVENOUS | Status: DC | PRN
  Administered 2022-06-08: 50 mg via INTRAVENOUS

## 2022-06-08 MED ORDER — PHENYLEPHRINE HCL-NACL 20-0.9 MG/250ML-% IV SOLN
INTRAVENOUS | Status: DC | PRN
  Administered 2022-06-08: 50 ug/min via INTRAVENOUS

## 2022-06-08 MED ORDER — DEXAMETHASONE SODIUM PHOSPHATE 10 MG/ML IJ SOLN
INTRAMUSCULAR | Status: DC | PRN
  Administered 2022-06-08: 5 mg via INTRAVENOUS

## 2022-06-08 MED ORDER — ROSUVASTATIN CALCIUM 20 MG PO TABS
40.0000 mg | ORAL_TABLET | Freq: Every day | ORAL | Status: DC
  Filled 2022-06-08: qty 2

## 2022-06-08 MED ORDER — PIPERACILLIN-TAZOBACTAM 3.375 G IVPB
3.3750 g | Freq: Three times a day (TID) | INTRAVENOUS | Status: DC
  Administered 2022-06-08 – 2022-06-10 (×5): 3.375 g via INTRAVENOUS
  Filled 2022-06-08 (×4): qty 50

## 2022-06-08 MED ORDER — SENNOSIDES-DOCUSATE SODIUM 8.6-50 MG PO TABS: 1.0000 | ORAL_TABLET | Freq: Every evening | ORAL | Status: DC | PRN

## 2022-06-08 MED ORDER — HYDROMORPHONE HCL 1 MG/ML IJ SOLN
0.5000 mg | INTRAMUSCULAR | Status: DC | PRN
  Administered 2022-06-10: 0.5 mg via INTRAVENOUS
  Filled 2022-06-08: qty 0.5

## 2022-06-08 MED ORDER — INSULIN ASPART 100 UNIT/ML IJ SOLN
0.0000 [IU] | Freq: Every day | INTRAMUSCULAR | Status: DC
  Administered 2022-06-09: 2 [IU] via SUBCUTANEOUS
  Administered 2022-06-11: 3 [IU] via SUBCUTANEOUS
  Filled 2022-06-08 (×2): qty 1

## 2022-06-08 MED ORDER — ONDANSETRON HCL 4 MG/2ML IJ SOLN
INTRAMUSCULAR | Status: DC | PRN
  Administered 2022-06-08: 4 mg via INTRAVENOUS

## 2022-06-08 MED ORDER — ASPIRIN 81 MG PO TBEC
81.0000 mg | DELAYED_RELEASE_TABLET | Freq: Every day | ORAL | Status: DC
  Administered 2022-06-09 – 2022-06-12 (×4): 81 mg via ORAL
  Filled 2022-06-08 (×4): qty 1

## 2022-06-08 MED ORDER — HYDROMORPHONE HCL 1 MG/ML IJ SOLN
0.5000 mg | INTRAMUSCULAR | Status: DC | PRN
  Administered 2022-06-08: 0.5 mg via INTRAVENOUS
  Filled 2022-06-08: qty 0.5

## 2022-06-08 MED ORDER — PANTOPRAZOLE SODIUM 40 MG PO TBEC
40.0000 mg | DELAYED_RELEASE_TABLET | ORAL | Status: DC
  Filled 2022-06-08: qty 1

## 2022-06-08 MED ORDER — ACETAMINOPHEN 325 MG PO TABS
650.0000 mg | ORAL_TABLET | Freq: Four times a day (QID) | ORAL | Status: DC | PRN
  Administered 2022-06-09: 650 mg via ORAL
  Filled 2022-06-08: qty 2

## 2022-06-08 MED ORDER — FENOFIBRATE 160 MG PO TABS
160.0000 mg | ORAL_TABLET | Freq: Every day | ORAL | Status: DC
  Administered 2022-06-11 – 2022-06-12 (×2): 160 mg via ORAL
  Filled 2022-06-08 (×5): qty 1

## 2022-06-08 MED ORDER — SODIUM CHLORIDE 0.9 % IV SOLN: INTRAVENOUS | Status: DC | PRN

## 2022-06-08 MED ORDER — ACETAMINOPHEN 10 MG/ML IV SOLN
INTRAVENOUS | Status: AC
  Filled 2022-06-08: qty 100

## 2022-06-08 MED ORDER — OXYCODONE HCL 5 MG PO TABS
5.0000 mg | ORAL_TABLET | ORAL | Status: DC | PRN
  Administered 2022-06-10 – 2022-06-12 (×3): 5 mg via ORAL
  Filled 2022-06-08 (×3): qty 1

## 2022-06-08 MED ORDER — LIDOCAINE HCL (PF) 2 % IJ SOLN
INTRAMUSCULAR | Status: AC
  Filled 2022-06-08: qty 5

## 2022-06-08 MED ORDER — PROPOFOL 10 MG/ML IV BOLUS
INTRAVENOUS | Status: DC | PRN
  Administered 2022-06-08: 120 mg via INTRAVENOUS

## 2022-06-08 MED ORDER — PIPERACILLIN-TAZOBACTAM 3.375 G IVPB
3.3750 g | Freq: Three times a day (TID) | INTRAVENOUS | Status: DC
  Filled 2022-06-08 (×2): qty 50

## 2022-06-08 MED ORDER — BUPIVACAINE-EPINEPHRINE (PF) 0.5% -1:200000 IJ SOLN
INTRAMUSCULAR | Status: DC | PRN
  Administered 2022-06-08: 30 mL

## 2022-06-08 MED ORDER — SODIUM CHLORIDE 0.9 % IR SOLN
Status: DC | PRN
  Administered 2022-06-08: 1200 mL

## 2022-06-08 SURGICAL SUPPLY — 51 items
BULB RESERV EVAC DRAIN JP 100C (MISCELLANEOUS) IMPLANT
CUTTER FLEX LINEAR 45M (STAPLE) IMPLANT
DERMABOND ADVANCED .7 DNX12 (GAUZE/BANDAGES/DRESSINGS) ×1 IMPLANT
DRAIN CHANNEL JP 19F (MISCELLANEOUS) IMPLANT
DRSG TEGADERM 4X4.75 (GAUZE/BANDAGES/DRESSINGS) IMPLANT
ELECT CAUTERY BLADE TIP 2.5 (TIP) ×1
ELECT REM PT RETURN 9FT ADLT (ELECTROSURGICAL) ×1
ELECTRODE CAUTERY BLDE TIP 2.5 (TIP) ×1 IMPLANT
ELECTRODE REM PT RTRN 9FT ADLT (ELECTROSURGICAL) ×1 IMPLANT
GLOVE SURG SYN 7.0 (GLOVE) ×2 IMPLANT
GLOVE SURG SYN 7.0 PF PI (GLOVE) ×1 IMPLANT
GLOVE SURG SYN 7.5  E (GLOVE) ×2
GLOVE SURG SYN 7.5 E (GLOVE) ×2 IMPLANT
GLOVE SURG SYN 7.5 PF PI (GLOVE) ×1 IMPLANT
GOWN STRL REUS W/ TWL LRG LVL3 (GOWN DISPOSABLE) ×2 IMPLANT
GOWN STRL REUS W/TWL LRG LVL3 (GOWN DISPOSABLE) ×2
IRRIGATION STRYKERFLOW (MISCELLANEOUS) IMPLANT
IRRIGATOR STRYKERFLOW (MISCELLANEOUS) ×1
IV NS 1000ML (IV SOLUTION) ×2
IV NS 1000ML BAXH (IV SOLUTION) ×1 IMPLANT
KIT TURNOVER KIT A (KITS) ×1 IMPLANT
LABEL OR SOLS (LABEL) ×1 IMPLANT
LIGASURE LAP MARYLAND 5MM 37CM (ELECTROSURGICAL) IMPLANT
MANIFOLD NEPTUNE II (INSTRUMENTS) ×1 IMPLANT
NEEDLE HYPO 22GX1.5 SAFETY (NEEDLE) ×1 IMPLANT
NS IRRIG 500ML POUR BTL (IV SOLUTION) ×1 IMPLANT
PACK LAP CHOLECYSTECTOMY (MISCELLANEOUS) ×1 IMPLANT
RELOAD 45 VASCULAR/THIN (ENDOMECHANICALS) IMPLANT
RELOAD STAPLE 45 2.5 WHT GRN (ENDOMECHANICALS) IMPLANT
RELOAD STAPLE 45 3.5 BLU ETS (ENDOMECHANICALS) ×1 IMPLANT
RELOAD STAPLE TA45 3.5 REG BLU (ENDOMECHANICALS) ×1 IMPLANT
SCISSORS METZENBAUM CVD 33 (INSTRUMENTS) ×1 IMPLANT
SLEEVE ADV FIXATION 5X100MM (TROCAR) ×1 IMPLANT
SPONGE DRAIN TRACH 4X4 STRL 2S (GAUZE/BANDAGES/DRESSINGS) IMPLANT
SUT ETHILON 3-0 FS-10 30 BLK (SUTURE) ×1
SUT MNCRL 4-0 (SUTURE) ×1
SUT MNCRL 4-0 27XMFL (SUTURE) ×1
SUT VIC AB 3-0 SH 27 (SUTURE) ×1
SUT VIC AB 3-0 SH 27X BRD (SUTURE) IMPLANT
SUT VICRYL 0 AB UR-6 (SUTURE) ×1 IMPLANT
SUTURE EHLN 3-0 FS-10 30 BLK (SUTURE) IMPLANT
SUTURE MNCRL 4-0 27XMF (SUTURE) ×1 IMPLANT
SYS BAG RETRIEVAL 10MM (BASKET) ×1
SYS KII FIOS ACCESS ABD 5X100 (TROCAR) ×1
SYSTEM BAG RETRIEVAL 10MM (BASKET) ×1 IMPLANT
SYSTEM KII FIOS ACES ABD 5X100 (TROCAR) ×1 IMPLANT
TRAP FLUID SMOKE EVACUATOR (MISCELLANEOUS) ×1 IMPLANT
TRAY FOLEY MTR SLVR 16FR STAT (SET/KITS/TRAYS/PACK) ×1 IMPLANT
TROCAR BALLN GELPORT 12X130M (ENDOMECHANICALS) ×1 IMPLANT
TUBING EVAC SMOKE HEATED PNEUM (TUBING) ×1 IMPLANT
WATER STERILE IRR 500ML POUR (IV SOLUTION) ×1 IMPLANT

## 2022-06-08 NOTE — H&P (Signed)
History and Physical   Laurie Harris GBT:[id number] DOB: 08-15-1941 DOA: 06/08/2022  PCP: Laurie Haven, MD  Patient coming from: Home  I have personally briefly reviewed patient's old medical records in [place].  Chief Concern: Abdominal pain  HPI: [name] is a 81 year old female with history of hypertension, hyperlipidemia, GERD, who presents ED from PCP office for chief concerns of abdominal pain.  Initial vitals in the emergency department showed temperature of 97.7, respiration rate of 16, heart rate of 113, blood pressure 138/68, SPO2 of 99% on room air.  Serum sodium is 142, potassium 4.6, chloride 109, bicarb 22, BUN of 30, serum creatinine of 1.13, GFR 48, nonfasting blood glucose 196, WBC 10.7, hemoglobin 13.2, platelets of 138.  Lactic acid is 1.4.  AST is 68.  ALT is 52.  UA was negative for leukocytes and nitrates.  CT abdomen pelvis with contrast: Read as acute appendicitis.  No extraluminal air to indicate rupture.  No periappendiceal abscess.  No other acute abnormality within the abdomen or pelvis.  Liver morphologic changes suggesting cirrhosis.  Hepatic steatosis.  Aortic atherosclerosis.  ED treatment: Zosyn -------------------------------------- At bedside patient was able to tell me her name, her age, location, and current calendar year.  She reports that she has been having abdominal pain since approximately 11 PM on 06/07/2022.  She reports her last p.o. intake was a.m. at approximately 8/830 on day of admission.  She denies any known fever, chills, nausea, vomiting, diarrhea, dysuria, hematuria, syncope.  Social history: She denies history of tobacco and recreational drug use.  She infrequently consumes communal wine.  ROS: Constitutional: no weight change, no fever ENT/Mouth: no sore throat, no rhinorrhea Eyes: no eye pain, no vision changes Cardiovascular: no chest pain, no dyspnea,  no edema, no palpitations Respiratory: no  cough, no sputum, no wheezing Gastrointestinal: no nausea, no vomiting, no diarrhea, no constipation Genitourinary: no urinary incontinence, no dysuria, no hematuria Musculoskeletal: no arthralgias, no myalgias Skin: no skin lesions, no pruritus, Neuro: + weakness, no loss of consciousness, no syncope Psych: no anxiety, no depression, no decrease appetite Heme/Lymph: no bruising, no bleeding  ED Course: Discussed with emergency medicine provider, patient requiring hospitalization for chief concerns of appendicitis.  Assessment/Plan  Principal Problem:   Acute appendicitis Active Problems:   Benign essential HTN   Hyperlipidemia   Carotid artery disease (HCC)   Osteoporosis   Constipation   Depression, major, single episode, mild (HCC)   Diabetic retinopathy (HCC)   Stage 3a chronic kidney disease (CKD) (HCC)   Type 2 diabetes mellitus without complication, with long-term current use of insulin (HCC)   Assessment and Plan:  * Acute appendicitis - Status post Zosyn 3.375 g IV one-time dose in the ED - We will continue with Zosyn per pharmacy - EDP consulted general surgery for surgery consideration - N.p.o. except for sips with meds; anticipate the OR on day of admission - IV maintenance fluid: Lactated Ringer's at 100 mL/h IV, 10 hours ordered - Supportive measures: Ondansetron p.o./IV as needed for nausea and vomiting; morphine 2 mg IV every 4 hours as needed for severe pain, 4 doses ordered - Admit to telemetry medical, observation  Type 2 diabetes mellitus without complication, with long-term current use of insulin (HCC) - Insulin SSI with agents coverage ordered - Patient takes 14 units of insulin 75/25 at home, this has been held on admission due to patient n.p.o. and going into surgery - Goal inpatient blood glucose level is  140-180  Diabetic retinopathy ([place]) - Continue appropriate insulin use and outpatient follow-up with ophthalmologist  Hyperlipidemia -  Rosuvastatin 40 mg nightly resumed  Benign essential HTN - Patient takes benazepril 5 mg daily resumed for 06/10/2019 - Hydralazine 5 mg IV every 8 hours as needed for SBP greater than 180, 4 days ordered  DVT prophylaxis-pharmacologic DVT prophylaxis has not been initiated on admission due to patient being taken to the OR - AM team to initiate pharmacologic DVT prophylaxis when the benefits outweigh the risk  Chart reviewed.   DVT prophylaxis: SCDs Code Status: DNR, confirmed with patient and daughter at bedside Diet: N.p.o. except for sips with meds Family Communication: Daughter at bedside updated Disposition Plan: Pending clinical course Consults called: General surgery Admission status: Telemetry medical, observation  Past Medical History:  Diagnosis Date   Carotid arterial disease ([place])    a. 09/2016 Carotid U/S: R carotid bifurcation dzs of 50-69%, L carotid bifurcation dzs of <50%. Patent vertebral arteries w/ anegrade flow.   Cerebellar hemorrhage ([place]) 03/16/2015   Left Cerebellar Hemorrhage- See Care Everywhere Seen by [facility]     Chicken pox    CKD (chronic kidney disease), stage III (HCC)    functioning at 46%   Diabetes mellitus without complication ([place])    Hemorrhagic stroke ([place]) 12/31/2014   a. in the setting of warfarin therapy.   History of appendicitis 08/07/2016   Hyperlipidemia    Hypertension    Syncope    a. 09/2016 Echo: EF 55-60%, no rwma, Gr1 DD.   Past Surgical History:  Procedure Laterality Date   ABCESS DRAINAGE  06/14/2016   appendix   APPENDECTOMY  06/13/2016   CATARACT EXTRACTION W/PHACO Left 09/04/2017   Procedure: CATARACT EXTRACTION PHACO AND INTRAOCULAR LENS PLACEMENT ([place]) COMPLICATED LEFT DIABETIC;  Surgeon: Leandrew Koyanagi, MD;  Location: [place];  Service: Ophthalmology;  Laterality: Left;  Diabetic - insulin   CATARACT EXTRACTION W/PHACO Right 10/02/2017   Procedure: CATARACT EXTRACTION PHACO AND INTRAOCULAR  LENS PLACEMENT ([place]) COMPLICATED  RIGHT DIABETIC;  Surgeon: Leandrew Koyanagi, MD;  Location: [facility];  Service: Ophthalmology;  Laterality: Right;  Diabetic - insulin   CHOLECYSTECTOMY N/A 06/17/2018   Procedure: LAPAROSCOPIC CHOLECYSTECTOMY;  Surgeon: Benjamine Sprague, DO;  Location: [facility];  Service: General;  Laterality: N/A;   COLONOSCOPY     NECK SURGERY     Social History:  reports that she has never smoked. She has never used smokeless tobacco. She reports that she does not drink alcohol and does not use drugs.  Allergies  Allergen Reactions   Warfarin And Related     Possible stroke   Family History  Problem Relation Age of Onset   Diabetes Mother    Dementia Mother    Heart disease Mother    Cancer Mother        lung   Cancer Father        colon   Stroke Daughter    Hyperlipidemia Daughter    Breast cancer Neg Hx    Family history: Family history reviewed and not pertinent  Prior to Admission medications   Medication Sig Start Date End Date Taking? Authorizing Provider  acetaminophen (TYLENOL) 325 MG tablet Take 650 mg by mouth every 6 (six) hours as needed.   Yes [provider]  aspirin EC 81 MG tablet Take 81 mg by mouth daily.   Yes [provider]  benazepril (LOTENSIN) 5 MG tablet TAKE ONE TABLET EVERY DAY Patient  taking differently: Take 5 mg by mouth daily. 05/12/19  Yes Laurie Haven, MD  calcium carbonate (OS-CAL) 600 MG TABS tablet Take one tablet by mouth once a day. 12/01/20  Yes Laurie Haven, MD  Cholecalciferol (VITAMIN D3) 50 MCG (2000 UT) TABS Take 2,000 Units by mouth daily.   Yes [provider]  fenofibrate 160 MG tablet Take 1 tablet (160 mg total) by mouth daily. 02/03/20  Yes Laurie Haven, MD  HUMALOG MIX 75/25 KWIKPEN (75-25) 100 UNIT/ML KwikPen INJECT 14 UNITS SUBCUTANEOUSLY TWICE A DAY WITH A MEAL. 06/23/21  Yes Laurie Haven, MD  hydrocortisone (ANUSOL-HC) 2.5 % rectal cream Place 1  Application rectally 2 (two) times daily. 05/03/22  Yes Vanga, Tally Due, MD  pantoprazole (PROTONIX) 40 MG tablet TAKE 1 TABLET BY MOUTH EVERY-OTHER-DAY Patient taking differently: Take 40 mg by mouth every other day. 04/25/20  Yes Laurie Haven, MD  rosuvastatin (CRESTOR) 40 MG tablet TAKE (1) TABLET BY MOUTH ONCE A DAY Patient taking differently: Take 40 mg by mouth daily. 01/10/21  Yes Laurie Haven, MD  BD AUTOSHIELD DUO 30G X 5 MM MISC USE TWICE DAILY AS DIRECTED 04/14/21   Laurie Haven, MD  Multiple Vitamin (MULTIVITAMIN WITH MINERALS) TABS tablet Take 1 tablet by mouth daily. Patient not taking: Reported on 06/08/2022    [provider]  NOVOFINE PLUS 32G X 4 MM MISC USE TWICE A DAY AS DIRECTED 02/22/20   Laurie Haven, MD   Physical Exam: Vitals:   06/08/22 1058 06/08/22 1505  BP: 134/69 (!) 180/70  Pulse: (!) 108 74  Resp: 16   Temp: 98.4 F (36.9 C) 98 F (36.7 C)  TempSrc: Oral Oral  SpO2: 94% 98%  Weight: 53.5 kg   Height: '5\' 6"'$  (1.676 m)    Constitutional: appears age-appropriate, frail, NAD, calm, comfortable Eyes: PERRL, lids and conjunctivae normal ENMT: Mucous membranes are moist. Posterior pharynx clear of any exudate or lesions. Age-appropriate dentition. Hearing appropriate Neck: normal, supple, no masses, no thyromegaly Respiratory: clear to auscultation bilaterally, no wheezing, no crackles. Normal respiratory effort. No accessory muscle use.  Cardiovascular: Regular rate and rhythm, no murmurs / rubs / gallops. No extremity edema. 2+ pedal pulses. No carotid bruits.  Abdomen: + Right upper quadrant/right side tenderness. Bowel sounds positive.  Musculoskeletal: no clubbing / cyanosis. No joint deformity upper and lower extremities. Good ROM, no contractures, no atrophy. Normal muscle tone.  Skin: no rashes, lesions, ulcers. No induration.  Facial hair/hirsutism present Neurologic: Sensation intact. Strength 5/5 in all 4.   Psychiatric: Normal judgment and insight. Alert and oriented x 3. Normal mood.   EKG: Not indicated on admission  Chest x-ray on Admission: I personally reviewed and I agree with radiologist reading as below.  CT ABDOMEN PELVIS W CONTRAST  Result Date: 06/08/2022 CLINICAL DATA:  Right lower quadrant pain. Reported history of appendicitis that resolved without surgery. EXAM: CT ABDOMEN AND PELVIS WITH CONTRAST TECHNIQUE: Multidetector CT imaging of the abdomen and pelvis was performed using the standard protocol following bolus administration of intravenous contrast. RADIATION DOSE REDUCTION: This exam was performed according to the departmental dose-optimization program which includes automated exposure control, adjustment of the mA and/or kV according to patient size and/or use of iterative reconstruction technique. CONTRAST:  33m OMNIPAQUE IOHEXOL 300 MG/ML  SOLN COMPARISON:  12/08/2019 FINDINGS: Lower chest: No acute abnormality. Hepatobiliary: Decreased liver attenuation consistent with fatty infiltration. Central volume loss. Relative enlargement of the lateral segment  of the left lobe and caudate lobe. Subtle surface nodularity. No liver mass. Status post cholecystectomy. No bile duct dilation. Pancreas: Unremarkable. No pancreatic ductal dilatation or surrounding inflammatory changes. Spleen: Normal in size without focal abnormality. Adrenals/Urinary Tract: Normal adrenal glands. Kidneys normal in size, orientation and position with symmetric enhancement and excretion. Subcentimeter low-attenuation lesion, lateral mid to upper pole of the left kidney, consistent with a cyst. No follow-up recommended. No other renal masses or lesions, no stones and no hydronephrosis. Normal ureters. Bladder unremarkable. Stomach/Bowel: Appendix is dilated to 1.2 cm. Small appendical lith. Adjacent inflammatory changes. No extraluminal air. No defined fluid collection to indicate an abscess. Normal stomach. Small  bowel and colon are normal in caliber. No wall thickening. No inflammation. Numerous colonic diverticula, mostly on the left. No diverticulitis. Mild to moderate increase in the colonic stool burden. Increased rectal stool. Vascular/Lymphatic: Dense aortic atherosclerosis. No aneurysm. No enlarged lymph nodes. Reproductive: Uterus and bilateral adnexa are unremarkable. Other: Trace amount of free fluid in the right pelvis. Musculoskeletal: No fracture or acute finding. Scoliosis and degenerative changes of the visualized spine. No bone lesion. IMPRESSION: 1. Acute appendicitis. No extraluminal air to indicate rupture. No periappendiceal abscess. 2. No other acute abnormality within the abdomen or pelvis. 3. Liver morphologic changes suggesting cirrhosis. Hepatic steatosis. 4. Aortic atherosclerosis. Electronically Signed   By: Lajean Manes M.D.   On: 06/08/2022 13:32    Labs on Admission: I have personally reviewed following labs  CBC: Recent Labs  Lab 06/08/22 1106  WBC 10.7*  HGB 13.2  HCT 40.6  MCV 98.8  PLT 174*   Basic Metabolic Panel: Recent Labs  Lab 06/08/22 1106  NA 142  K 4.6  CL 109  CO2 22  GLUCOSE 196*  BUN 30*  CREATININE 1.13*  CALCIUM 9.9   GFR: Estimated Creatinine Clearance: 31.9 mL/min (A) (by C-G formula based on SCr of 1.13 mg/dL (H)).  Liver Function Tests: Recent Labs  Lab 06/08/22 1106  AST 68*  ALT 52*  ALKPHOS 89  BILITOT 1.3*  PROT 7.2  ALBUMIN 3.6   Recent Labs  Lab 06/08/22 1106  LIPASE 33   Urine analysis:    Component Value Date/Time   COLORURINE AMBER (A) 06/08/2022 1224   APPEARANCEUR HAZY (A) 06/08/2022 1224   APPEARANCEUR Clear 12/31/2014 1822   LABSPEC 1.026 06/08/2022 1224   LABSPEC 1.009 12/31/2014 1822   PHURINE 5.0 06/08/2022 1224   GLUCOSEU 50 (A) 06/08/2022 1224   GLUCOSEU >=500 12/31/2014 1822   HGBUR NEGATIVE 06/08/2022 1224   BILIRUBINUR NEGATIVE 06/08/2022 1224   BILIRUBINUR Negative 12/31/2014 1822   KETONESUR  NEGATIVE 06/08/2022 1224   PROTEINUR NEGATIVE 06/08/2022 1224   NITRITE NEGATIVE 06/08/2022 1224   LEUKOCYTESUR NEGATIVE 06/08/2022 1224   LEUKOCYTESUR Negative 12/31/2014 1822   Dr. Tobie Poet [facility]  If 7PM-7AM, please contact overnight-coverage provider If 7AM-7PM, please contact day coverage provider [email or web address]  06/08/2022, 3:37 PM

## 2022-06-08 NOTE — Assessment & Plan Note (Signed)
Continue statin 

## 2022-06-08 NOTE — ED Triage Notes (Signed)
Ems report pt from [place] care for right quad pain. VSS. Pt with hx appendicitis that resolved without surgery.

## 2022-06-08 NOTE — Assessment & Plan Note (Addendum)
Patient is a  Frail Type 2 DM with tachycardia, RLQ pain, currently  has s/s of recurrent appendicitis /peritonitis on exam.  She has a history  Of  appendicitis in 2017 resulting in a para appendiceal abscess that required drainage and antibiotics.   Calling EMS to transport to ER for evaluation

## 2022-06-08 NOTE — Progress Notes (Addendum)
Subjective:  Patient ID: Laurie Harris, female    DOB: 1941/03/14  Age: 81 y.o. MRN: [id number]  CC: The encounter diagnosis was Acute abdominal pain.   HPI Laurie Harris presents for right sided abdominal pain  Chief Complaint  Patient presents with   Acute Visit    Right side stomach pain   81 yr old female with history of , Type 2 DM   insulin requiring, who presents with < 24 hours of exquisite RLQ pain .  Pertinent  history of para appendiceal abscess  in 2017, s/p drainage and L/t abx,    as well as acute diverticultis in 2021. .  The right sided pain has been increasing in intensity for the last 24 hours,  became constant   around 11 PM,  transiently resolved with tylenol.  This morning the pain was present upon waking and rapidly became  too severe to tolerate.  She Denies fevers, no change in BMs.  She denies nausea and had breakfast this morning at 8 a m .  She was brought in by her daughter who is concerned that she has decompensated significantly compared to 5 pm yesterday when she last saw her.    Patient states that the pain is veyr  reminiscent of the pain in 2017 when she  developed appendicitis .  She is unable to walk unassisted.  Outpatient Medications Prior to Visit  Medication Sig Dispense Refill   acetaminophen (TYLENOL) 325 MG tablet Take 650 mg by mouth every 6 (six) hours as needed.     aspirin EC 81 MG tablet Take 81 mg by mouth daily.     BD AUTOSHIELD DUO 30G X 5 MM MISC USE TWICE DAILY AS DIRECTED 100 each 1   benazepril (LOTENSIN) 5 MG tablet TAKE ONE TABLET EVERY DAY 90 tablet 2   calcium carbonate (OS-CAL) 600 MG TABS tablet Take one tablet by mouth once a day. 30 tablet 10   fenofibrate 160 MG tablet Take 1 tablet (160 mg total) by mouth daily. 90 tablet 1   HUMALOG MIX 75/25 KWIKPEN (75-25) 100 UNIT/ML KwikPen INJECT 14 UNITS SUBCUTANEOUSLY TWICE A DAY WITH A MEAL. 15 mL 10   hydrocortisone (ANUSOL-HC) 2.5 % rectal cream Place 1 Application rectally 2  (two) times daily. 30 g 1   Multiple Vitamin (MULTIVITAMIN WITH MINERALS) TABS tablet Take 1 tablet by mouth daily.     NOVOFINE PLUS 32G X 4 MM MISC USE TWICE A DAY AS DIRECTED 100 each 6   pantoprazole (PROTONIX) 40 MG tablet TAKE 1 TABLET BY MOUTH EVERY-OTHER-DAY 15 tablet 10   rosuvastatin (CRESTOR) 40 MG tablet TAKE (1) TABLET BY MOUTH ONCE A DAY 30 tablet 10   Cholecalciferol (VITAMIN D3) 50 MCG (2000 UT) capsule Take one tablet by mouth daily.  30 capsule 10   docusate sodium (COLACE) 100 MG capsule Take 1 capsule (100 mg total) by mouth 2 (two) times daily as needed for mild constipation. 10 capsule 0   insulin lispro protamine-lispro (HUMALOG 75/25 MIX) (75-25) 100 UNIT/ML SUSP injection Inject 14 Units into the skin 2 (two) times daily with a meal. 10 mL 0   triamcinolone cream (KENALOG) 0.1 % Apply 1 application topically 2 (two) times daily. (Patient not taking: Reported on 05/04/2022) 30 g 0   No facility-administered medications prior to visit.    Review of Systems;  Patient denies headache, fevers, malaise, unintentional weight loss, skin rash, eye pain, sinus congestion and sinus pain, sore  throat, dysphagia,  hemoptysis , cough, dyspnea, wheezing, chest pain, palpitations, orthopnea, edema, nausea, melena, diarrhea, constipation, flank pain, dysuria, hematuria, urinary  Frequency, nocturia, numbness, tingling, seizures,  Focal weakness, Loss of consciousness,  Tremor, insomnia, depression, anxiety, and suicidal ideation.      Objective:  BP 138/68 (BP Location: Left Arm, Patient Position: Sitting, Cuff Size: Normal)   Pulse (!) 113   Temp 97.7 F (36.5 C) (Oral)   Ht '5\' 6"'$  (1.676 m)   Wt 117 lb 12.8 oz (53.4 kg)   SpO2 99%   BMI 19.01 kg/m   BP Readings from Last 3 Encounters:  06/08/22 138/68  05/04/22 134/60  05/03/22 (!) 163/82    Wt Readings from Last 3 Encounters:  06/08/22 117 lb 12.8 oz (53.4 kg)  05/04/22 118 lb (53.5 kg)  05/03/22 117 lb 8 oz (53.3 kg)     General appearance: alert, frail appearing,  cooperative and appears ill Ears: normal TM's and external ear canals both ears Throat: lips, mucosa, and tongue normal; teeth and gums normal Neck: no adenopathy, no carotid bruit, supple, symmetrical, trachea midline and thyroid not enlarged, symmetric, no tenderness/mass/nodules Back: symmetric, no curvature. ROM normal. No CVA tenderness. Lungs: clear to auscultation bilaterally Heart:  tachycardic  no murmur, click, rub or gallop Abdomen:  soft, exquisitely tender in RLQ quadrant with rebound and guarding  Pulses: 2+ and symmetric Skin: Skin color, texture, turgor normal. No rashes or lesions Lymph nodes: Cervical, supraclavicular, and axillary nodes normal. Neuro:  awake and interactive with normal mood and affect. Higher cortical functions are normal. Speech is clear without word-finding difficulty or dysarthria. Extraocular movements are intact. Visual fields of both eyes are grossly intact. Sensation to light touch is grossly intact bilaterally of upper and lower extremities. Motor examination shows 4+/5 symmetric hand grip and upper extremity and 5/5 lower extremity strength. There is no pronation or drift. Gait is non-ataxic   Lab Results  Component Value Date   HGBA1C 6.5 04/25/2022   HGBA1C 6.4 12/19/2021   HGBA1C 6.4 06/09/2021    Lab Results  Component Value Date   CREATININE 1.05 04/25/2022   CREATININE 1.12 12/19/2021   CREATININE 1.29 (H) 06/09/2021    Lab Results  Component Value Date   WBC 5.1 04/25/2022   HGB 12.5 04/25/2022   HCT 36.5 04/25/2022   PLT 136.0 (L) 04/25/2022   GLUCOSE 164 (H) 04/25/2022   CHOL 129 12/19/2021   TRIG 226.0 (H) 12/19/2021   HDL 36.80 (L) 12/19/2021   LDLDIRECT 62.0 12/19/2021   LDLCALC 52 11/05/2019   ALT 24 12/19/2021   AST 29 12/19/2021   NA 140 04/25/2022   K 3.7 04/25/2022   CL 109 04/25/2022   CREATININE 1.05 04/25/2022   BUN 27 (H) 04/25/2022   CO2 23 04/25/2022    TSH 1.63 11/05/2019   INR 1.04 06/17/2018   HGBA1C 6.5 04/25/2022   MICROALBUR <0.7 08/04/2015    CT ABDOMEN PELVIS W CONTRAST  Result Date: 12/08/2019 CLINICAL DATA:  81 year old female with abdominal pain. Concern for diverticulitis. EXAM: CT ABDOMEN AND PELVIS WITH CONTRAST TECHNIQUE: Multidetector CT imaging of the abdomen and pelvis was performed using the standard protocol following bolus administration of intravenous contrast. CONTRAST:  46m OMNIPAQUE IOHEXOL 300 MG/ML  SOLN COMPARISON:  CT abdomen pelvis dated 06/17/2018. FINDINGS: Lower chest: The visualized lung bases are clear. There is coronary vascular calcification and calcification of the aortic annulus. No intra-abdominal free air or free fluid. Hepatobiliary: The liver is  unremarkable. No intrahepatic biliary duct dilatation. Cholecystectomy. No retained calcified stone in the central CBD. Pancreas: Unremarkable. No pancreatic ductal dilatation or surrounding inflammatory changes. Spleen: Normal in size without focal abnormality. Adrenals/Urinary Tract: The adrenal glands are unremarkable. There is no hydronephrosis on either side. There is symmetric enhancement and excretion of contrast by both kidneys. The visualized ureters and urinary bladder appear unremarkable. Stomach/Bowel: There is severe sigmoid diverticulosis with active inflammatory changes. No drainable fluid collection/abscess or diverticular perforation. There is diffuse colonic diverticulosis. There is large amount of stool throughout the colon. There is no bowel obstruction. The appendix is normal. Vascular/Lymphatic: Advanced aortoiliac atherosclerotic disease. The IVC is unremarkable. No portal venous gas. There is no adenopathy. Reproductive: The uterus is grossly unremarkable. No adnexal masses. Other: None Musculoskeletal: Osteopenia with severe degenerative changes of the spine and scoliosis. No acute osseous pathology. IMPRESSION: 1. Severe colonic diverticulosis with  acute sigmoid diverticulitis. No diverticular abscess or perforation. 2. Constipation. No bowel obstruction. Normal appendix. 3. Aortic Atherosclerosis (ICD10-I70.0). Electronically Signed   By: Anner Crete M.D.   On: 12/08/2019 23:37    Assessment & Plan:   Problem List Items Addressed This Visit     Acute abdominal pain    Patient is a  Frail Type 2 DM with tachycardia, RLQ pain, currently  has s/s of recurrent appendicitis /peritonitis on exam.  She has a history  Of  appendicitis in 2017 resulting in a para appendiceal abscess that required drainage and antibiotics.   Calling EMS to transport to ER for evaluation        I spent a total of  20 minutes with this patient in a face to face visit on the date of this encounter reviewing records from 2017   patient's diet and exercise habits, most recent  labs and imaging studies ,   and post visit ordering of testing and therapeutics.    Follow-up: No follow-ups on file.   Crecencio Mc, MD

## 2022-06-08 NOTE — Assessment & Plan Note (Addendum)
-   Patient takes benazepril 5 mg daily resumed for 06/10/2019 - Hydralazine 5 mg IV every 8 hours as needed for SBP greater than 180, 4 days ordered

## 2022-06-08 NOTE — Consult Note (Signed)
[facility] SURGICAL CONSULTATION NOTE (initial) - cpt: 99244   HISTORY OF PRESENT ILLNESS (HPI):  81 y.o. female presented to [facility] ED today for evaluation of abdominal pain. Patient reports she has noticed some right sided discomfort for a few days now but at 11 PM last night she noticed a sudden worsening of the pain and localization to the right lower quadrant. This is sharp in nature. She took some tylenol and gota few hours of sleep but when she awoke the pain was still present and not better. No associated fever, chills, nausea, emesis, or bowel changes. Patient with history of appendicitis with appendiceal abscess in October of 2017. At that time, she had drain placed with interventional radiology and seems she ultimately did well. It seems that in follow up discussion it was decided not to proceed with interval appendectomy given her significant improvements. From a general surgery perspective, in the interim, she was seen in October of 2019 for cholecystitis and had laparoscopic cholecystectomy with Dr Lysle Pearl. Additionally, was managed as an outpatient in April of 1610 for uncomplicated diverticulitis. She does have a history of previous hemorrhagic CVA with left sided weakness, HTN, HLD, T2DM, and CKD III. She is no longer on any blood thinners aside from 81 mg ASA. She ambulates with a walker but states she can preform ADLs without CP or SOB. Work up in the ED revealed mild leukocytosis to 10.7K, Hgb normal at 13.2, renal function at baseline with sCr - 1.13, no significant electrolyte derangements, and lactic acid level was normal at 1.4. CT Abdomen/Pelvis was obtained and showed recurrent appendicitis without evidence of perforation or abscess.   Surgery is consulted by emergency medicine physician Dr. Arta Silence, MD in this context for evaluation and management of recurrent appendicitis.   PAST MEDICAL HISTORY (PMH):  Past Medical History:  Diagnosis Date   Carotid  arterial disease ([place])    a. 09/2016 Carotid U/S: R carotid bifurcation dzs of 50-69%, L carotid bifurcation dzs of <50%. Patent vertebral arteries w/ anegrade flow.   Cerebellar hemorrhage ([place]) 03/16/2015   Left Cerebellar Hemorrhage- See Care Everywhere Seen by [facility]     Chicken pox    CKD (chronic kidney disease), stage III (HCC)    functioning at 46%   Diabetes mellitus without complication ([place])    Hemorrhagic stroke ([place]) 12/31/2014   a. in the setting of warfarin therapy.   History of appendicitis 08/07/2016   Hyperlipidemia    Hypertension    Syncope    a. 09/2016 Echo: EF 55-60%, no rwma, Gr1 DD.     PAST SURGICAL HISTORY ([place]):  Past Surgical History:  Procedure Laterality Date   ABCESS DRAINAGE  06/14/2016   appendix   APPENDECTOMY  06/13/2016   CATARACT EXTRACTION W/PHACO Left 09/04/2017   Procedure: CATARACT EXTRACTION PHACO AND INTRAOCULAR LENS PLACEMENT ([place]) COMPLICATED LEFT DIABETIC;  Surgeon: Leandrew Koyanagi, MD;  Location: [place];  Service: Ophthalmology;  Laterality: Left;  Diabetic - insulin   CATARACT EXTRACTION W/PHACO Right 10/02/2017   Procedure: CATARACT EXTRACTION PHACO AND INTRAOCULAR LENS PLACEMENT ([place]) COMPLICATED  RIGHT DIABETIC;  Surgeon: Leandrew Koyanagi, MD;  Location: [place];  Service: Ophthalmology;  Laterality: Right;  Diabetic - insulin   CHOLECYSTECTOMY N/A 06/17/2018   Procedure: LAPAROSCOPIC CHOLECYSTECTOMY;  Surgeon: Benjamine Sprague, DO;  Location: [facility];  Service: General;  Laterality: N/A;   COLONOSCOPY     NECK SURGERY       MEDICATIONS:  Prior to  Admission medications   Medication Sig Start Date End Date Taking? Authorizing Provider  acetaminophen (TYLENOL) 325 MG tablet Take 650 mg by mouth every 6 (six) hours as needed.    [provider]  aspirin EC 81 MG tablet Take 81 mg by mouth daily.    [provider]  BD AUTOSHIELD DUO 30G X 5 MM MISC USE TWICE DAILY AS DIRECTED  04/14/21   [name], MD  benazepril (LOTENSIN) 5 MG tablet TAKE ONE TABLET EVERY DAY 05/12/19   [name], MD  calcium carbonate (OS-CAL) 600 MG TABS tablet Take one tablet by mouth once a day. 12/01/20   [name], MD  fenofibrate 160 MG tablet Take 1 tablet (160 mg total) by mouth daily. 02/03/20   [name], MD  HUMALOG MIX 75/25 KWIKPEN (75-25) 100 UNIT/ML KwikPen INJECT 14 UNITS SUBCUTANEOUSLY TWICE A DAY WITH A MEAL. 06/23/21   [name], MD  hydrocortisone (ANUSOL-HC) 2.5 % rectal cream Place 1 Application rectally 2 (two) times daily. 05/03/22   Lin Landsman, MD  Multiple Vitamin (MULTIVITAMIN WITH MINERALS) TABS tablet Take 1 tablet by mouth daily.    [provider]  NOVOFINE PLUS 32G X 4 MM MISC USE TWICE A DAY AS DIRECTED 02/22/20   [name], MD  pantoprazole (PROTONIX) 40 MG tablet TAKE 1 TABLET BY MOUTH EVERY-OTHER-DAY 04/25/20   [name], MD  rosuvastatin (CRESTOR) 40 MG tablet TAKE (1) TABLET BY MOUTH ONCE A DAY 01/10/21   [name], MD     ALLERGIES:  Allergies  Allergen Reactions   Warfarin And Related     Possible stroke     SOCIAL HISTORY:  Social History   Socioeconomic History   Marital status: Widowed    Spouse name: Not on file   Number of children: Not on file   Years of education: Not on file   Highest education level: Not on file  Occupational History   Not on file  Tobacco Use   Smoking status: Never   Smokeless tobacco: Never  Vaping Use   Vaping Use: Never used  Substance and Sexual Activity   Alcohol use: No    Alcohol/week: 0.0 standard drinks of alcohol   Drug use: No   Sexual activity: Not on file  Other Topics Concern   Not on file  Social History Narrative   Not on file   Social Determinants of Health   Financial Resource Strain: Low Risk  (07/13/2021)   Overall Financial Resource Strain (CARDIA)    Difficulty of Paying Living Expenses: Not hard at all   Food Insecurity: No Food Insecurity (07/13/2021)   Hunger Vital Sign    Worried About Running Out of Food in the Last Year: Never true    [place] in the Last Year: Never true  Transportation Needs: No Transportation Needs (07/13/2021)   PRAPARE - Hydrologist (Medical): No    Lack of Transportation (Non-Medical): No  Physical Activity: Insufficiently Active (07/13/2021)   Exercise Vital Sign    Days of Exercise per Week: 6 days    Minutes of Exercise per Session: 20 min  Stress: No Stress Concern Present (07/13/2021)   [place]    Feeling of Stress : Not at all  Social Connections: Moderately Integrated (07/13/2021)   Social Connection and Isolation Panel [NHANES]    Frequency of Communication with  Friends and Family: More than three times a week    Frequency of Social Gatherings with Friends and Family: More than three times a week    Attends Religious Services: 1 to 4 times per year    Active Member of [employer] or Organizations: Yes    Attends Archivist Meetings: Never    Marital Status: Widowed  Intimate Partner Violence: Not At Risk (07/13/2021)   Humiliation, Afraid, Rape, and Kick questionnaire    Fear of Current or Ex-Partner: No    Emotionally Abused: No    Physically Abused: No    Sexually Abused: No     FAMILY HISTORY:  Family History  Problem Relation Age of Onset   Diabetes Mother    Dementia Mother    Heart disease Mother    Cancer Mother        lung   Cancer Father        colon   Stroke Daughter    Hyperlipidemia Daughter    Breast cancer Neg Hx       REVIEW OF SYSTEMS:  Review of Systems  Constitutional:  Negative for chills and fever.  HENT:  Negative for congestion and sore throat.   Respiratory:  Negative for cough and shortness of breath.   Cardiovascular:  Negative for chest pain and palpitations.  Gastrointestinal:  Positive for  abdominal pain. Negative for constipation, diarrhea, nausea and vomiting.  All other systems reviewed and are negative.   VITAL SIGNS:  Temp:  [97.7 F (36.5 C)-98.4 F (36.9 C)] 98.4 F (36.9 C) (10/06 1058) Pulse Rate:  [108-113] 108 (10/06 1058) Resp:  [16] 16 (10/06 1058) BP: (134-138)/(68-69) 134/69 (10/06 1058) SpO2:  [94 %-99 %] 94 % (10/06 1058) Weight:  [53.4 kg-53.5 kg] 53.5 kg (10/06 1058)     Height: '5\' 6"'$  (167.6 cm) Weight: 53.5 kg BMI (Calculated): 19.05   INTAKE/OUTPUT:  No intake/output data recorded.  PHYSICAL EXAM:  Physical Exam Vitals and nursing note reviewed. Exam conducted with a chaperone present.  Constitutional:      General: She is not in acute distress.    Appearance: She is well-developed.     Comments: Patient resting in bed, NAD, daughter at bedside  HENT:     Head: Normocephalic and atraumatic.  Eyes:     General: No scleral icterus.    Extraocular Movements: Extraocular movements intact.  Cardiovascular:     Rate and Rhythm: Normal rate.     Heart sounds: Normal heart sounds.  Pulmonary:     Effort: Pulmonary effort is normal. No respiratory distress.  Abdominal:     General: Abdomen is protuberant. A surgical scar is present.     Palpations: Abdomen is soft.     Tenderness: There is abdominal tenderness in the right lower quadrant and suprapubic area. There is rebound. There is no guarding.     Comments: Abdomen is firm, she is tender throughout her abdomen but much worse in the RLQ, mild distension, she does appear to have rebound. Overall, her examination is concerning for peritonitis   Genitourinary:    Comments: Deferred Skin:    General: Skin is warm and dry.     Coloration: Skin is not jaundiced or pale.  Neurological:     General: No focal deficit present.     Mental Status: She is alert and oriented to person, place, and time.  Psychiatric:        Mood and Affect: Mood normal.  Behavior: Behavior normal.      Labs:      Latest Ref Rng & Units 06/08/2022   11:06 AM 04/25/2022    2:17 PM 01/16/2022   10:03 AM  CBC  WBC 4.0 - 10.5 K/uL 10.7  5.1  5.3   Hemoglobin 12.0 - 15.0 g/dL 13.2  12.5  13.5   Hematocrit 36.0 - 46.0 % 40.6  36.5  40.7   Platelets 150 - 400 K/uL 138  136.0  141.0       Latest Ref Rng & Units 06/08/2022   11:06 AM 04/25/2022    2:17 PM 12/19/2021   10:07 AM  CMP  Glucose 70 - 99 mg/dL 196  164  176   BUN 8 - 23 mg/dL '30  27  24   '$ Creatinine 0.44 - 1.00 mg/dL 1.13  1.05  1.12   Sodium 135 - 145 mmol/L 142  140  145   Potassium 3.5 - 5.1 mmol/L 4.6  3.7  4.2   Chloride 98 - 111 mmol/L 109  109  108   CO2 22 - 32 mmol/L '22  23  29   '$ Calcium 8.9 - 10.3 mg/dL 9.9  8.9  9.7   Total Protein 6.5 - 8.1 g/dL 7.2   6.4   Total Bilirubin 0.3 - 1.2 mg/dL 1.3   0.8   Alkaline Phos 38 - 126 U/L 89   94   AST 15 - 41 U/L 68   29   ALT 0 - 44 U/L 52   24      Imaging studies:   CT Abdomen/Pelvis (06/08/2022) personally reviewed which shows dilation and inflammation of the appendix, there is no abscess, no free air, and radiologist report reviewed below: IMPRESSION: 1. Acute appendicitis. No extraluminal air to indicate rupture. No periappendiceal abscess. 2. No other acute abnormality within the abdomen or pelvis. 3. Liver morphologic changes suggesting cirrhosis. Hepatic steatosis. 4. Aortic atherosclerosis.   Assessment/Plan: (ICD-10's: K35.80) 81 y.o. female with RLQ abdominal pain and leukocytosis found to have recurrent appendicitis without perforation nor abscess, complicated by pertinent comorbidities including history of previous hemorrhagic CVA with left sided weakness, HTN, HLD, T2DM, and CKD III.   - Greatly appreciate medicine admission given her comorbid diseases - Fortunately, she is hemodynamically stable and labs are relatively reassuring aside from mild leukocytosis. However, her abdominal examination is rather concerning, and I worry she may be developing  peritonitis. Given her concerning examination findings, I do think it is prudent to proceed with laparoscopic appendectomy this evening with Dr Hampton Abbot. She, and her daughter, understand that there is a higher chance to need to convert to open procedure, leave drain. They are agreeable with proceeding  - All risks, benefits, and alternatives to above procedure(s) were discussed with the patient, all of her questions were answered to her expressed satisfaction, patient expresses she wishes to proceed, and informed consent was obtained  - NPO + IVF Support - IV Abx (Zosyn) - Monitor abdominal examination; on-going bowel function - Monitor leukocytosis - Pain control prn; antiemetics prn    - DVT prophylaxis; hold for OR  - Further management per primary service; we will of course follow along  All of the above findings and recommendations were discussed with the patient and her family (daughter at bedside), and all their questions were answered to their expressed satisfaction.  Thank you for the opportunity to participate in this patient's care.   -- Edison Simon, PA-C [facility] 06/08/2022,  2:20 PM M-F: 7am - 4pm

## 2022-06-08 NOTE — Consult Note (Signed)
Pharmacy Antibiotic Note  Laurie Harris is a 81 y.o. female admitted on 06/08/2022 with  acute appendicitis .  Pharmacy has been consulted for Zosyn dosing.  -Presented w/ multiple days of rt sided abd pain.  -10/6 CT A/P: Acute appendicitis   Plan: Zosyn 3.375g IV q8h (4 hour infusion).  Height: '5\' 6"'$  (167.6 cm) Weight: 53.5 kg (118 lb) IBW/kg (Calculated) : 59.3  Temp (24hrs), Avg:98 F (36.7 C), Min:97.7 F (36.5 C), Max:98.4 F (36.9 C)  Recent Labs  Lab 06/08/22 1106  WBC 10.7*  CREATININE 1.13*  LATICACIDVEN 1.4    Estimated Creatinine Clearance: 31.9 mL/min (A) (by C-G formula based on SCr of 1.13 mg/dL (H)).    Allergies  Allergen Reactions   Warfarin And Related     Possible stroke    Antimicrobials this admission: Zosyn 10/6 >>   Dose adjustments this admission: N/A  Microbiology results: N/A  Thank you for allowing pharmacy to be a part of this patient's care.  Lorin Picket 06/08/2022 3:50 PM

## 2022-06-08 NOTE — Assessment & Plan Note (Signed)
-   Continue appropriate insulin use and outpatient follow-up with ophthalmologist

## 2022-06-08 NOTE — ED Triage Notes (Signed)
Pt arrives with c/o RLQ pain that started yesterday. Pt denies n/v. Pt endorses pain on palpation of the area.

## 2022-06-08 NOTE — ED Provider Notes (Signed)
[facility] Provider Note    Event Date/Time   First MD Initiated Contact with Patient 06/08/22 1201     (approximate)   History   Abdominal Pain   HPI  Laurie Harris is a 81 y.o. female with history of diabetes, hypertension, and hyperlipidemia who presents with right lower quadrant pain rating to the right flank, relatively acute onset last night and persistent since then.  She denies any associated nausea or vomiting, fever, change in her bowel movements, or urinary symptoms.  She had a periappendicular abscess previously that was treated without surgery and states the pain feels somewhat similar today.  I reviewed the past medical records.  The patient's most recent outpatient encounter was on 9/1 with cardiology for recurrent syncope.  She was being treated for hypertension and bilateral carotid disease.   Physical Exam   Triage Vital Signs: ED Triage Vitals  Enc Vitals Group     BP 06/08/22 1058 134/69     Pulse Rate 06/08/22 1058 (!) 108     Resp 06/08/22 1058 16     Temp 06/08/22 1058 98.4 F (36.9 C)     Temp Source 06/08/22 1058 Oral     SpO2 06/08/22 1058 94 %     Weight 06/08/22 1058 118 lb (53.5 kg)     Height 06/08/22 1058 '5\' 6"'$  (1.676 m)     Head Circumference --      Peak Flow --      Pain Score 06/08/22 1103 7     Pain Loc --      Pain Edu? --      Excl. in [place]? --     Most recent vital signs: Vitals:   06/08/22 1058 06/08/22 1505  BP: 134/69 (!) 180/70  Pulse: (!) 108 74  Resp: 16   Temp: 98.4 F (36.9 C) 98 F (36.7 C)  SpO2: 94% 98%     General: Awake, no distress.  CV:  Good peripheral perfusion.  Resp:  Normal effort.  Abd:  Moderate right lower quadrant and mild diffuse tenderness.  No distention.  Other:  No scleral icterus.   ED Results / Procedures / Treatments   Labs (all labs ordered are listed, but only abnormal results are displayed) Labs Reviewed  COMPREHENSIVE METABOLIC PANEL - Abnormal;  Notable for the following components:      Result Value   Glucose, Bld 196 (*)    BUN 30 (*)    Creatinine, Ser 1.13 (*)    AST 68 (*)    ALT 52 (*)    Total Bilirubin 1.3 (*)    GFR, Estimated 48 (*)    All other components within normal limits  CBC - Abnormal; Notable for the following components:   WBC 10.7 (*)    Platelets 138 (*)    All other components within normal limits  URINALYSIS, ROUTINE W REFLEX MICROSCOPIC - Abnormal; Notable for the following components:   Color, Urine AMBER (*)    APPearance HAZY (*)    Glucose, UA 50 (*)    All other components within normal limits  LIPASE, BLOOD  LACTIC ACID, PLASMA     EKG   RADIOLOGY  CT abdomen/pelvis: I independently viewed and interpreted the images; there is no significant free fluid or any free air in the abdomen.  Radiology report indicates acute appendicitis with no abscess or other complication.  PROCEDURES:  Critical Care performed: No  Procedures   MEDICATIONS ORDERED IN ED:  Medications  acetaminophen (TYLENOL) tablet 650 mg (has no administration in time range)    Or  acetaminophen (TYLENOL) suppository 650 mg (has no administration in time range)  ondansetron (ZOFRAN) tablet 4 mg (has no administration in time range)    Or  ondansetron (ZOFRAN) injection 4 mg (has no administration in time range)  senna-docusate (Senokot-S) tablet 1 tablet (has no administration in time range)  morphine (PF) 2 MG/ML injection 2 mg (2 mg Intravenous Given 06/08/22 1602)  lactated ringers infusion ( Intravenous New Bag/Given 06/08/22 1608)  insulin aspart (novoLOG) injection 0-5 Units (has no administration in time range)  insulin aspart (novoLOG) injection 0-15 Units (has no administration in time range)  hydrALAZINE (APRESOLINE) injection 5 mg (has no administration in time range)  rosuvastatin (CRESTOR) tablet 40 mg (has no administration in time range)  aspirin EC tablet 81 mg (has no administration in time range)   benazepril (LOTENSIN) tablet 5 mg (has no administration in time range)  fenofibrate tablet 160 mg (160 mg Oral Not Given 06/08/22 1558)  pantoprazole (PROTONIX) EC tablet 40 mg (has no administration in time range)  piperacillin-tazobactam (ZOSYN) IVPB 3.375 g (has no administration in time range)  iohexol (OMNIPAQUE) 300 MG/ML solution 75 mL (75 mLs Intravenous Contrast Given 06/08/22 1313)  piperacillin-tazobactam (ZOSYN) IVPB 3.375 g (0 g Intravenous Stopped 06/08/22 1601)     IMPRESSION / MDM / ASSESSMENT AND PLAN / ED COURSE  I reviewed the triage vital signs and the nursing notes.  81 year old female with PMH as noted above presents with right lower quadrant pain since last night.  Differential diagnosis includes, but is not limited to, appendicitis, colitis, diverticulitis, mesenteric adenitis, small bowel obstruction, UTI/pyelonephritis, ureteral stone.  We will obtain lab work-up, CT abdomen/pelvis, and reassess.  Patient's presentation is most consistent with acute presentation with potential threat to life or bodily function.  ----------------------------------------- 3:40 PM on 06/08/2022 -----------------------------------------  CT shows evidence of acute appendicitis.  Lab work-up reveals borderline leukocytosis and a normal lactate.  I consulted Dr. Hampton Abbot from general surgery and the patient was evaluated by the PA initially.  The surgery team recommends admission to the hospitalist service but will likely take the patient to the OR.  I then consulted Dr. Tobie Poet from the hospitalist service who agrees to admit the patient based on our discussion.   FINAL CLINICAL IMPRESSION(S) / ED DIAGNOSES   Final diagnoses:  Acute appendicitis, unspecified acute appendicitis type     Rx / DC Orders   ED Discharge Orders     None        Note:  This document was prepared using Dragon voice recognition software and may include unintentional dictation errors.    Arta Silence, MD 06/08/22 [phone]

## 2022-06-08 NOTE — Anesthesia Preprocedure Evaluation (Signed)
Anesthesia Evaluation  Patient identified by MRN, date of birth, ID band Patient awake    Reviewed: Allergy & Precautions, NPO status , Patient's Chart, lab work & pertinent test results  History of Anesthesia Complications Negative for: history of anesthetic complications  Airway Mallampati: II  TM Distance: >3 FB Neck ROM: full    Dental  (+) Dental Advidsory Given, Poor Dentition   Pulmonary neg pulmonary ROS, neg shortness of breath, neg COPD   Pulmonary exam normal        Cardiovascular hypertension, (-) angina (-) Past MI and (-) CABG negative cardio ROS Normal cardiovascular exam     Neuro/Psych  PSYCHIATRIC DISORDERS      negative neurological ROS     GI/Hepatic Neg liver ROS,GERD  ,,  Endo/Other  negative endocrine ROSdiabetes    Renal/GU Renal disease     Musculoskeletal   Abdominal   Peds  Hematology negative hematology ROS (+)   Anesthesia Other Findings Past Medical History: No date: Carotid arterial disease ([place])     Comment:  a. 09/2016 Carotid U/S: R carotid bifurcation dzs of               50-69%, L carotid bifurcation dzs of <50%. Patent               vertebral arteries w/ anegrade flow. 03/16/2015: Cerebellar hemorrhage ([place])     Comment:  Left Cerebellar Hemorrhage- See Care Everywhere Seen by               [facility]   No date: Chicken pox No date: CKD (chronic kidney disease), stage III ([place])     Comment:  functioning at 46% No date: Diabetes mellitus without complication ([place]) 37/06/6268: Hemorrhagic stroke ([place])     Comment:  a. in the setting of warfarin therapy. 08/07/2016: History of appendicitis No date: Hyperlipidemia No date: Hypertension No date: Syncope     Comment:  a. 09/2016 Echo: EF 55-60%, no rwma, Gr1 DD.  Past Surgical History: 06/14/2016: ABCESS DRAINAGE     Comment:  appendix 06/13/2016: APPENDECTOMY 09/04/2017: CATARACT EXTRACTION W/PHACO; Left      Comment:  Procedure: CATARACT EXTRACTION PHACO AND INTRAOCULAR               LENS PLACEMENT ([place]) COMPLICATED LEFT DIABETIC;  Surgeon:              Leandrew Koyanagi, MD;  Location: [place];              Service: Ophthalmology;  Laterality: Left;  Diabetic -               insulin 10/02/2017: CATARACT EXTRACTION W/PHACO; Right     Comment:  Procedure: CATARACT EXTRACTION PHACO AND INTRAOCULAR               LENS PLACEMENT ([place]) COMPLICATED  RIGHT DIABETIC;                Surgeon: Leandrew Koyanagi, MD;  Location: [place];  Service: Ophthalmology;  Laterality:               Right;  Diabetic - insulin 06/17/2018: CHOLECYSTECTOMY; N/A     Comment:  Procedure: LAPAROSCOPIC CHOLECYSTECTOMY;  Surgeon:               Benjamine Sprague, DO;  Location: [facility];  Service: General;  Laterality: N/A; No date: COLONOSCOPY No date: NECK SURGERY  BMI    Body Mass Index: 19.05 kg/m      Reproductive/Obstetrics negative OB ROS                             Anesthesia Physical Anesthesia Plan  ASA: 2 and emergent  Anesthesia Plan: General ETT   Post-op Pain Management:    Induction: Intravenous  PONV Risk Score and Plan: Ondansetron and Dexamethasone  Airway Management Planned: Oral ETT  Additional Equipment:   Intra-op Plan:   Post-operative Plan: Extubation in OR  Informed Consent: I have reviewed the patients History and Physical, chart, labs and discussed the procedure including the risks, benefits and alternatives for the proposed anesthesia with the patient or authorized representative who has indicated his/her understanding and acceptance.     Dental Advisory Given  Plan Discussed with: Anesthesiologist, CRNA and Surgeon  Anesthesia Plan Comments: (Patient consented for risks of anesthesia including but not limited to:  - adverse reactions to medications - damage to eyes, teeth, lips or other oral  mucosa - nerve damage due to positioning  - sore throat or hoarseness - Damage to heart, brain, nerves, lungs, other parts of body or loss of life  Patient voiced understanding.)       Anesthesia Quick Evaluation

## 2022-06-08 NOTE — Anesthesia Postprocedure Evaluation (Signed)
Anesthesia Post Note  Patient: Laurie Harris  Procedure(s) Performed: APPENDECTOMY LAPAROSCOPIC (Abdomen)  Patient location during evaluation: PACU Anesthesia Type: General Level of consciousness: awake and alert Pain management: pain level controlled Vital Signs Assessment: post-procedure vital signs reviewed and stable Respiratory status: spontaneous breathing, nonlabored ventilation, respiratory function stable and patient connected to nasal cannula oxygen Cardiovascular status: blood pressure returned to baseline and stable Postop Assessment: no apparent nausea or vomiting Anesthetic complications: no   No notable events documented.   Last Vitals:  Vitals:   06/08/22 2215 06/08/22 2222  BP: (!) 163/59 (!) 161/78  Pulse: 96 91  Resp: 18 14  Temp: 36.6 C (!) 36.3 C  SpO2: 100% 99%    Last Pain:  Vitals:   06/08/22 2222  TempSrc:   PainSc: 0-No pain                 Dimas Millin

## 2022-06-08 NOTE — Assessment & Plan Note (Signed)
-   Insulin SSI with agents coverage ordered - Patient takes 14 units of insulin 75/25 at home, this has been held on admission due to patient n.p.o. and going into surgery - Goal inpatient blood glucose level is 140-180

## 2022-06-08 NOTE — IPAL (Addendum)
  Interdisciplinary Goals of Care Family Meeting   Date carried out: 06/08/2022  Location of the meeting: Bedside  Member's involved: Physician, Bedside Registered Nurse, and Family Member or next of kin  Durable Power of Attorney or acting medical decision maker: Daughter    Discussion: We discussed goals of care for Laurie Harris .    Code status: Full DNR  Disposition: Continue current acute care  Time spent for the meeting: 5 minutes  Dr. Tobie Poet  06/08/2022, 3:27 PM

## 2022-06-08 NOTE — Anesthesia Procedure Notes (Signed)
Procedure Name: Intubation Date/Time: 06/08/2022 8:04 PM  Performed by: Esaw Grandchild, CRNAPre-anesthesia Checklist: Patient identified, Emergency Drugs available, Suction available and Patient being monitored Patient Re-evaluated:Patient Re-evaluated prior to induction Oxygen Delivery Method: Circle system utilized Preoxygenation: Pre-oxygenation with 100% oxygen Induction Type: IV induction Ventilation: Mask ventilation without difficulty Laryngoscope Size: Miller and 2 Grade View: Grade I Tube type: Oral Tube size: 7.0 mm Number of attempts: 1 Airway Equipment and Method: Stylet, Oral airway and Bite block Placement Confirmation: ETT inserted through vocal cords under direct vision, positive ETCO2 and breath sounds checked- equal and bilateral Secured at: 21 cm Tube secured with: Tape Dental Injury: Teeth and Oropharynx as per pre-operative assessment

## 2022-06-08 NOTE — Assessment & Plan Note (Signed)
-   Status post Zosyn 3.375 g IV one-time dose in the ED - We will continue with Zosyn per pharmacy - EDP consulted general surgery for surgery consideration - N.p.o. except for sips with meds; anticipate the OR on day of admission - IV maintenance fluid: Lactated Ringer's at 100 mL/h IV, 10 hours ordered - Supportive measures: Ondansetron p.o./IV as needed for nausea and vomiting; morphine 2 mg IV every 4 hours as needed for severe pain, 4 doses ordered - Admit to telemetry medical, observation

## 2022-06-08 NOTE — Op Note (Signed)
  Procedure Date:  06/08/2022  Pre-operative Diagnosis:  Acute appendicitis  Post-operative Diagnosis: Acute perforated appendicitis  Procedure:  Laparoscopic appendectomy  Surgeon:  Melvyn Neth, MD  Anesthesia:  General endotracheal  Estimated Blood Loss:  20 ml  Specimens:  appendix  Complications:  None  Indications for Procedure:  This is a 81 y.o. female who presents with abdominal pain and workup revealing acute appendicitis.  The options of surgery versus observation were reviewed with the patient and/or family. The risks of bleeding, infection, recurrence of symptoms, negative laparoscopy, potential for an open procedure, bowel injury, abscess or infection, were all discussed with the patient and she was willing to proceed.  Description of Procedure: The patient was correctly identified in the preoperative area and brought into the operating room.  The patient was placed supine with VTE prophylaxis in place.  Appropriate time-outs were performed.  Anesthesia was induced and the patient was intubated.  Foley catheter was placed.  Appropriate antibiotics were infused.  The abdomen was prepped and draped in a sterile fashion. An infraumbilical incision was made. A cutdown technique was used to enter the abdominal cavity without injury, and a Hasson trocar was inserted.  Pneumoperitoneum was obtained with appropriate opening pressures.  Two 5-mm ports were placed in the suprapubic and left lateral positions under direct visualization.  The right lower quadrant was inspected and the appendix was identified and found to be acutely inflamed and perforated.  There was some seropurulent fluid in the right lower quadrant going to the pelvis.  This fluid was suctioned.  The appendix was carefully dissected.  There were two appendicoliths that had come out of the appendix, which was perforated near the tip.  The mesoappendix was divided using the LigaSure.  The base of the appendix was  dissected out and divided with a standard load Endo GIA.  The appendix was placed in an Endocatch bag.  The right lower quadrant was then inspected again revealing an intact staple line, no bleeding, and no bowel injury.  The area was thoroughly irrigated.  A 19 Fr. Blake drain was placed via the left lateral port going to the pelvis and right lower quadrant.  The 5 mm ports were removed under direct visualization and the Hasson trocar was removed.  The Endocatch bag was brought out through the umbilical incision.  The fascial opening was closed using 0 vicryl suture.  Local anesthetic was infused in all incisions.  The umbilical incision was closed with 3-0 Vicryl and 4-0 Monocryl.  The other port incisions were closed with 4-0 Monocryl.  The drain was secured using 3-0 Nylon.  The wounds were cleaned and sealed with DermaBond.  The drain was dressed with 4x4 gauze and TegaDerm.  Foley catheter was removed and the patient was emerged from anesthesia and extubated and brought to the recovery room for further management.  The patient tolerated the procedure well and all counts were correct at the end of the case.   Melvyn Neth, MD

## 2022-06-08 NOTE — Transfer of Care (Signed)
Immediate Anesthesia Transfer of Care Note  Patient: Laurie Harris  Procedure(s) Performed: APPENDECTOMY LAPAROSCOPIC (Abdomen)  Patient Location: PACU  Anesthesia Type:General  Level of Consciousness: drowsy  Airway & Oxygen Therapy: Patient Spontanous Breathing and Patient connected to face mask oxygen  Post-op Assessment: Report given to RN, Post -op Vital signs reviewed and stable and Patient moving all extremities  Post vital signs: Reviewed and stable  Last Vitals:  Vitals Value Taken Time  BP 156/77 06/08/22 2149  Temp    Pulse 94 06/08/22 2154  Resp 19 06/08/22 2154  SpO2 100 % 06/08/22 2154  Vitals shown include unvalidated device data.  Last Pain:  Vitals:   06/08/22 1853  TempSrc: Oral  PainSc: 10-Worst pain ever         Complications: No notable events documented.

## 2022-06-08 NOTE — Hospital Course (Signed)
[name] is a 81 year old female with history of hypertension, hyperlipidemia, GERD, who presents ED from PCP office for chief concerns of abdominal pain.  Initial vitals in the emergency department showed temperature of 97.7, respiration rate of 16, heart rate of 113, blood pressure 138/68, SPO2 of 99% on room air.  Serum sodium is 142, potassium 4.6, chloride 109, bicarb 22, BUN of 30, serum creatinine of 1.13, GFR 48, nonfasting blood glucose 196, WBC 10.7, hemoglobin 13.2, platelets of 138.  Lactic acid is 1.4.  AST is 68.  ALT is 52.  UA was negative for leukocytes and nitrates.  CT abdomen pelvis with contrast: Read as acute appendicitis.  No extraluminal air to indicate rupture.  No periappendiceal abscess.  No other acute abnormality within the abdomen or pelvis.  Liver morphologic changes suggesting cirrhosis.  Hepatic steatosis.  Aortic atherosclerosis.  ED treatment: Zosyn

## 2022-06-09 ENCOUNTER — Encounter: Payer: Self-pay | Admitting: Surgery

## 2022-06-09 DIAGNOSIS — R2689 Other abnormalities of gait and mobility: Secondary | ICD-10-CM | POA: Diagnosis not present

## 2022-06-09 DIAGNOSIS — D6959 Other secondary thrombocytopenia: Secondary | ICD-10-CM | POA: Diagnosis present

## 2022-06-09 DIAGNOSIS — I129 Hypertensive chronic kidney disease with stage 1 through stage 4 chronic kidney disease, or unspecified chronic kidney disease: Secondary | ICD-10-CM | POA: Diagnosis present

## 2022-06-09 DIAGNOSIS — K5900 Constipation, unspecified: Secondary | ICD-10-CM | POA: Diagnosis present

## 2022-06-09 DIAGNOSIS — K3532 Acute appendicitis with perforation and localized peritonitis, without abscess: Secondary | ICD-10-CM | POA: Diagnosis present

## 2022-06-09 DIAGNOSIS — F0393 Unspecified dementia, unspecified severity, with mood disturbance: Secondary | ICD-10-CM | POA: Diagnosis present

## 2022-06-09 DIAGNOSIS — K37 Unspecified appendicitis: Secondary | ICD-10-CM | POA: Diagnosis present

## 2022-06-09 DIAGNOSIS — F05 Delirium due to known physiological condition: Secondary | ICD-10-CM | POA: Diagnosis not present

## 2022-06-09 DIAGNOSIS — M6259 Muscle wasting and atrophy, not elsewhere classified, multiple sites: Secondary | ICD-10-CM | POA: Diagnosis not present

## 2022-06-09 DIAGNOSIS — F03918 Unspecified dementia, unspecified severity, with other behavioral disturbance: Secondary | ICD-10-CM | POA: Diagnosis not present

## 2022-06-09 DIAGNOSIS — N1831 Chronic kidney disease, stage 3a: Secondary | ICD-10-CM | POA: Diagnosis present

## 2022-06-09 DIAGNOSIS — M810 Age-related osteoporosis without current pathological fracture: Secondary | ICD-10-CM | POA: Diagnosis present

## 2022-06-09 DIAGNOSIS — Z66 Do not resuscitate: Secondary | ICD-10-CM | POA: Diagnosis present

## 2022-06-09 DIAGNOSIS — E1122 Type 2 diabetes mellitus with diabetic chronic kidney disease: Secondary | ICD-10-CM | POA: Diagnosis present

## 2022-06-09 DIAGNOSIS — K7030 Alcoholic cirrhosis of liver without ascites: Secondary | ICD-10-CM

## 2022-06-09 DIAGNOSIS — Z833 Family history of diabetes mellitus: Secondary | ICD-10-CM | POA: Diagnosis not present

## 2022-06-09 DIAGNOSIS — K219 Gastro-esophageal reflux disease without esophagitis: Secondary | ICD-10-CM | POA: Diagnosis present

## 2022-06-09 DIAGNOSIS — Z79899 Other long term (current) drug therapy: Secondary | ICD-10-CM | POA: Diagnosis not present

## 2022-06-09 DIAGNOSIS — I10 Essential (primary) hypertension: Secondary | ICD-10-CM | POA: Diagnosis not present

## 2022-06-09 DIAGNOSIS — K760 Fatty (change of) liver, not elsewhere classified: Secondary | ICD-10-CM | POA: Diagnosis present

## 2022-06-09 DIAGNOSIS — M6281 Muscle weakness (generalized): Secondary | ICD-10-CM | POA: Diagnosis not present

## 2022-06-09 DIAGNOSIS — R278 Other lack of coordination: Secondary | ICD-10-CM | POA: Diagnosis not present

## 2022-06-09 DIAGNOSIS — E11319 Type 2 diabetes mellitus with unspecified diabetic retinopathy without macular edema: Secondary | ICD-10-CM | POA: Diagnosis present

## 2022-06-09 DIAGNOSIS — Z823 Family history of stroke: Secondary | ICD-10-CM | POA: Diagnosis not present

## 2022-06-09 DIAGNOSIS — K7460 Unspecified cirrhosis of liver: Secondary | ICD-10-CM | POA: Diagnosis present

## 2022-06-09 DIAGNOSIS — Z801 Family history of malignant neoplasm of trachea, bronchus and lung: Secondary | ICD-10-CM | POA: Diagnosis not present

## 2022-06-09 DIAGNOSIS — Z8249 Family history of ischemic heart disease and other diseases of the circulatory system: Secondary | ICD-10-CM | POA: Diagnosis not present

## 2022-06-09 DIAGNOSIS — Z800 Family history of malignant neoplasm of digestive organs: Secondary | ICD-10-CM | POA: Diagnosis not present

## 2022-06-09 DIAGNOSIS — R41841 Cognitive communication deficit: Secondary | ICD-10-CM | POA: Diagnosis not present

## 2022-06-09 DIAGNOSIS — E785 Hyperlipidemia, unspecified: Secondary | ICD-10-CM | POA: Diagnosis present

## 2022-06-09 DIAGNOSIS — I69254 Hemiplegia and hemiparesis following other nontraumatic intracranial hemorrhage affecting left non-dominant side: Secondary | ICD-10-CM | POA: Diagnosis not present

## 2022-06-09 DIAGNOSIS — N179 Acute kidney failure, unspecified: Secondary | ICD-10-CM | POA: Diagnosis not present

## 2022-06-09 DIAGNOSIS — K3580 Unspecified acute appendicitis: Secondary | ICD-10-CM | POA: Diagnosis present

## 2022-06-09 DIAGNOSIS — K353 Acute appendicitis with localized peritonitis, without perforation or gangrene: Secondary | ICD-10-CM | POA: Diagnosis not present

## 2022-06-09 DIAGNOSIS — R1312 Dysphagia, oropharyngeal phase: Secondary | ICD-10-CM | POA: Diagnosis not present

## 2022-06-09 DIAGNOSIS — F320 Major depressive disorder, single episode, mild: Secondary | ICD-10-CM | POA: Diagnosis present

## 2022-06-09 LAB — GLUCOSE, CAPILLARY
Glucose-Capillary: 145 mg/dL — ABNORMAL HIGH (ref 70–99)
Glucose-Capillary: 179 mg/dL — ABNORMAL HIGH (ref 70–99)
Glucose-Capillary: 216 mg/dL — ABNORMAL HIGH (ref 70–99)
Glucose-Capillary: 202 mg/dL — ABNORMAL HIGH (ref 70–99)

## 2022-06-09 LAB — BASIC METABOLIC PANEL
Anion gap: 7 (ref 5–15)
BUN: 33 mg/dL — ABNORMAL HIGH (ref 8–23)
CO2: 22 mmol/L (ref 22–32)
Calcium: 9 mg/dL (ref 8.9–10.3)
Chloride: 111 mmol/L (ref 98–111)
Creatinine, Ser: 1.11 mg/dL — ABNORMAL HIGH (ref 0.44–1.00)
GFR, Estimated: 49 mL/min — ABNORMAL LOW (ref >60)
Glucose, Bld: 165 mg/dL — ABNORMAL HIGH (ref 70–99)
Potassium: 4.2 mmol/L (ref 3.5–5.1)
Sodium: 140 mmol/L (ref 135–145)

## 2022-06-09 LAB — CBC
HCT: 36.4 % (ref 36.0–46.0)
Hemoglobin: 12.1 g/dL (ref 12.0–15.0)
MCH: 33.1 pg (ref 26.0–34.0)
MCHC: 33.2 g/dL (ref 30.0–36.0)
MCV: 99.5 fL (ref 80.0–100.0)
Platelets: 138 10*3/uL — ABNORMAL LOW (ref 150–400)
RBC: 3.66 MIL/uL — ABNORMAL LOW (ref 3.87–5.11)
RDW: 14.9 % (ref 11.5–15.5)
WBC: 9.8 10*3/uL (ref 4.0–10.5)
nRBC: 0 % (ref 0.0–0.2)

## 2022-06-09 LAB — MAGNESIUM: Magnesium: 1.8 mg/dL (ref 1.7–2.4)

## 2022-06-09 MED ORDER — KETOROLAC TROMETHAMINE 30 MG/ML IJ SOLN
15.0000 mg | Freq: Four times a day (QID) | INTRAMUSCULAR | Status: DC | PRN
  Administered 2022-06-09: 15 mg via INTRAVENOUS
  Filled 2022-06-09: qty 1

## 2022-06-09 MED ORDER — PANTOPRAZOLE SODIUM 40 MG IV SOLR
40.0000 mg | INTRAVENOUS | Status: DC
  Administered 2022-06-09 – 2022-06-12 (×4): 40 mg via INTRAVENOUS
  Filled 2022-06-09 (×4): qty 10

## 2022-06-09 NOTE — Progress Notes (Signed)
Patient disoriented x4. When asked what her name is she just states "I don't know. I don't know". Most of her speech is incomprehensible. Patient was also unable to swallow pills or thin liquids safely. Dr. Jimmye Norman at bedside and will evaluate orders.

## 2022-06-09 NOTE — Progress Notes (Signed)
06/09/2022  Subjective: Patient is 1 Day Post-Op status post laparoscopic appendectomy for perforated appendicitis.  Blake drain was left in place.  This morning, the patient is very confused and disoriented.  Unclear if she is having a lot of pain but she reports that she is not able to sit up and wanted to lie flat.  When nurse asked her name she was not sure.  Vital signs: Temp:  [94.3 F (34.6 C)-98.8 F (37.1 C)] 98.2 F (36.8 C) (10/07 0746) Pulse Rate:  [74-116] 97 (10/07 0746) Resp:  [14-20] 18 (10/07 0746) BP: (119-180)/(59-81) 119/64 (10/07 0746) SpO2:  [97 %-100 %] 97 % (10/07 0746) Weight:  [55.3 kg] 55.3 kg (10/06 2240)   Intake/Output: 10/06 0701 - 10/07 0700 In: 942.8 [I.V.:796.7; IV Piggyback:146.1] Out: 275 [Urine:50; Drains:205; Blood:20] Last BM Date : 06/07/22  Physical Exam: Constitutional: Confused Abdomen: Soft, nondistended, appears tender to palpation.  Incisions are clean, dry, intact.  Blake drain with serosanguineous fluid.  Labs:  Recent Labs    06/08/22 1106 06/09/22 0513  WBC 10.7* 9.8  HGB 13.2 12.1  HCT 40.6 36.4  PLT 138* 138*   Recent Labs    06/08/22 1106 06/09/22 0513  NA 142 140  K 4.6 4.2  CL 109 111  CO2 22 22  GLUCOSE 196* 165*  BUN 30* 33*  CREATININE 1.13* 1.11*  CALCIUM 9.9 9.0   No results for input(s): "LABPROT", "INR" in the last 72 hours.  Imaging: CT ABDOMEN PELVIS W CONTRAST  Result Date: 06/08/2022 CLINICAL DATA:  Right lower quadrant pain. Reported history of appendicitis that resolved without surgery. EXAM: CT ABDOMEN AND PELVIS WITH CONTRAST TECHNIQUE: Multidetector CT imaging of the abdomen and pelvis was performed using the standard protocol following bolus administration of intravenous contrast. RADIATION DOSE REDUCTION: This exam was performed according to the departmental dose-optimization program which includes automated exposure control, adjustment of the mA and/or kV according to patient size and/or use  of iterative reconstruction technique. CONTRAST:  13m OMNIPAQUE IOHEXOL 300 MG/ML  SOLN COMPARISON:  12/08/2019 FINDINGS: Lower chest: No acute abnormality. Hepatobiliary: Decreased liver attenuation consistent with fatty infiltration. Central volume loss. Relative enlargement of the lateral segment of the left lobe and caudate lobe. Subtle surface nodularity. No liver mass. Status post cholecystectomy. No bile duct dilation. Pancreas: Unremarkable. No pancreatic ductal dilatation or surrounding inflammatory changes. Spleen: Normal in size without focal abnormality. Adrenals/Urinary Tract: Normal adrenal glands. Kidneys normal in size, orientation and position with symmetric enhancement and excretion. Subcentimeter low-attenuation lesion, lateral mid to upper pole of the left kidney, consistent with a cyst. No follow-up recommended. No other renal masses or lesions, no stones and no hydronephrosis. Normal ureters. Bladder unremarkable. Stomach/Bowel: Appendix is dilated to 1.2 cm. Small appendical lith. Adjacent inflammatory changes. No extraluminal air. No defined fluid collection to indicate an abscess. Normal stomach. Small bowel and colon are normal in caliber. No wall thickening. No inflammation. Numerous colonic diverticula, mostly on the left. No diverticulitis. Mild to moderate increase in the colonic stool burden. Increased rectal stool. Vascular/Lymphatic: Dense aortic atherosclerosis. No aneurysm. No enlarged lymph nodes. Reproductive: Uterus and bilateral adnexa are unremarkable. Other: Trace amount of free fluid in the right pelvis. Musculoskeletal: No fracture or acute finding. Scoliosis and degenerative changes of the visualized spine. No bone lesion. IMPRESSION: 1. Acute appendicitis. No extraluminal air to indicate rupture. No periappendiceal abscess. 2. No other acute abnormality within the abdomen or pelvis. 3. Liver morphologic changes suggesting cirrhosis. Hepatic steatosis. 4.  Aortic  atherosclerosis. Electronically Signed   By: Lajean Manes M.D.   On: 06/08/2022 13:32    Assessment/Plan: This is a 81 y.o. female s/p laparoscopic appendectomy.  - Although the patient's appendix was perforated, the surgery otherwise was uneventful.  Blake drain is serosanguineous without any evidence of feculent material.  Patient may be confused and disoriented due to anesthesia medications or narcotics.  We will add low-dose Toradol as another option for pain control that is nonnarcotic. - Continue clear liquid diet and advance her diet as tolerated as long as her mental status is appropriate. - Continue IV antibiotics. - From surgical standpoint, we will want to keep the patient in hospital at least 1 more day so she can get IV antibiotics today given the perforation.  Depending on her mental status tomorrow, may be able to discharge home tomorrow.   Melvyn Neth, [facility]

## 2022-06-09 NOTE — Progress Notes (Addendum)
PROGRESS NOTE    Laurie Harris  JKK:[id number] DOB: September 03, 1941 DOA: 06/08/2022 PCP: [name], MD   Assessment & Plan:   Principal Problem:   Acute appendicitis Active Problems:   Benign essential HTN   Hyperlipidemia   Carotid artery disease ([place])   Osteoporosis   Constipation   Depression, major, single episode, mild ([place])   Diabetic retinopathy ([place])   Stage 3a chronic kidney disease (CKD) ([place])   Type 2 diabetes mellitus without complication, with long-term current use of insulin ([place])   Acute perforated appendicitis  Assessment and Plan: Acute perforated appendicitis: s/p lap appendectomy on 06/08/22 as per gen surg. Continue on IV zosyn. Zofran prn for nausea/vomiting.  Oxycodone, dilaudid prn for pain. Gen surg following and recs apprec  Likely dementia: continue w/ supportive care. Likely component acute hospital delirium    DM2: well controlled, HbA1c 6.5 in 8/23. Continue on SSI w/ accuchecks   Diabetic retinopathy: continue w/ outpatient f/u w/ ophthalmologist   HLD: continue on statin    HTN: continue on benazepail. IV hydralazine prn   Liver cirrhosis: as noted on CT scan. No hx of alcohol abuse/use. Possibly secondary to NASH. Will need outpatient GI f/u. Pt's daughter is aware and verbalized her understanding   Thrombocytopenia: likely secondary to cirrhosis   DVT prophylaxis: SCDs Code Status: DNR Family Communication: discussed pt's care w/ pt's daughter, Jamey Ripa, and answered her questions  Disposition Plan: depends on PT/OT recs  Level of care: Telemetry Medical\  Status is: Inpatient Remains inpatient appropriate because: severity of illness       Consultants:  Gen surg   Procedures:   Antimicrobials: zosyn   Subjective: Pt c/o not feeling well   Objective: Vitals:   06/08/22 2240 06/08/22 2315 06/09/22 0022 06/09/22 0512  BP: (!) 160/81 (!) 162/69 (!) 146/70 (!) 141/73  Pulse: 90 91 93 (!) 104  Resp: '19 20 20 16   '$ Temp:   (!) 94.3 F (34.6 C) 98.1 F (36.7 C)  TempSrc:   Axillary Oral  SpO2: 100% 98% 99% 98%  Weight: 55.3 kg     Height: '5\' 6"'$  (1.676 m)       Intake/Output Summary (Last 24 hours) at 06/09/2022 0734 Last data filed at 06/09/2022 0656 Gross per 24 hour  Intake 942.77 ml  Output 275 ml  Net 667.77 ml   Filed Weights   06/08/22 1058 06/08/22 2240  Weight: 53.5 kg 55.3 kg    Examination:  General exam: Appears uncomfortable & confused  Respiratory system: Clear to auscultation. Respiratory effort normal. Cardiovascular system: S1 & S2 +. No rubs, gallops or clicks.  Gastrointestinal system: Abdomen is nondistended, soft and nontender. Normal bowel sounds heard. Central nervous system: Awake but confused. Moves all extremities  Psychiatry: Judgement and insight appears poor. Flat mood and affect    Data Reviewed: I have personally reviewed following labs and imaging studies  CBC: Recent Labs  Lab 06/08/22 1106 06/09/22 0513  WBC 10.7* 9.8  HGB 13.2 12.1  HCT 40.6 36.4  MCV 98.8 99.5  PLT 138* 716*   Basic Metabolic Panel: Recent Labs  Lab 06/08/22 1106 06/09/22 0513  NA 142 140  K 4.6 4.2  CL 109 111  CO2 22 22  GLUCOSE 196* 165*  BUN 30* 33*  CREATININE 1.13* 1.11*  CALCIUM 9.9 9.0  MG  --  1.8   GFR: Estimated Creatinine Clearance: 33.5 mL/min (A) (by C-G formula based on SCr of 1.11 mg/dL (H)). Liver  Function Tests: Recent Labs  Lab 06/08/22 1106  AST 68*  ALT 52*  ALKPHOS 89  BILITOT 1.3*  PROT 7.2  ALBUMIN 3.6   Recent Labs  Lab 06/08/22 1106  LIPASE 33   No results for input(s): "AMMONIA" in the last 168 hours. Coagulation Profile: No results for input(s): "INR", "PROTIME" in the last 168 hours. Cardiac Enzymes: No results for input(s): "CKTOTAL", "CKMB", "CKMBINDEX", "TROPONINI" in the last 168 hours. BNP (last 3 results) No results for input(s): "PROBNP" in the last 8760 hours. HbA1C: No results for input(s): "HGBA1C" in  the last 72 hours. CBG: Recent Labs  Lab 06/08/22 1819 06/08/22 2152  GLUCAP 144* 151*   Lipid Profile: No results for input(s): "CHOL", "HDL", "LDLCALC", "TRIG", "CHOLHDL", "LDLDIRECT" in the last 72 hours. Thyroid Function Tests: No results for input(s): "TSH", "T4TOTAL", "FREET4", "T3FREE", "THYROIDAB" in the last 72 hours. Anemia Panel: No results for input(s): "VITAMINB12", "FOLATE", "FERRITIN", "TIBC", "IRON", "RETICCTPCT" in the last 72 hours. Sepsis Labs: Recent Labs  Lab 06/08/22 1106  LATICACIDVEN 1.4    No results found for this or any previous visit (from the past 240 hour(s)).       Radiology Studies: CT ABDOMEN PELVIS W CONTRAST  Result Date: 06/08/2022 CLINICAL DATA:  Right lower quadrant pain. Reported history of appendicitis that resolved without surgery. EXAM: CT ABDOMEN AND PELVIS WITH CONTRAST TECHNIQUE: Multidetector CT imaging of the abdomen and pelvis was performed using the standard protocol following bolus administration of intravenous contrast. RADIATION DOSE REDUCTION: This exam was performed according to the departmental dose-optimization program which includes automated exposure control, adjustment of the mA and/or kV according to patient size and/or use of iterative reconstruction technique. CONTRAST:  54m OMNIPAQUE IOHEXOL 300 MG/ML  SOLN COMPARISON:  12/08/2019 FINDINGS: Lower chest: No acute abnormality. Hepatobiliary: Decreased liver attenuation consistent with fatty infiltration. Central volume loss. Relative enlargement of the lateral segment of the left lobe and caudate lobe. Subtle surface nodularity. No liver mass. Status post cholecystectomy. No bile duct dilation. Pancreas: Unremarkable. No pancreatic ductal dilatation or surrounding inflammatory changes. Spleen: Normal in size without focal abnormality. Adrenals/Urinary Tract: Normal adrenal glands. Kidneys normal in size, orientation and position with symmetric enhancement and excretion.  Subcentimeter low-attenuation lesion, lateral mid to upper pole of the left kidney, consistent with a cyst. No follow-up recommended. No other renal masses or lesions, no stones and no hydronephrosis. Normal ureters. Bladder unremarkable. Stomach/Bowel: Appendix is dilated to 1.2 cm. Small appendical lith. Adjacent inflammatory changes. No extraluminal air. No defined fluid collection to indicate an abscess. Normal stomach. Small bowel and colon are normal in caliber. No wall thickening. No inflammation. Numerous colonic diverticula, mostly on the left. No diverticulitis. Mild to moderate increase in the colonic stool burden. Increased rectal stool. Vascular/Lymphatic: Dense aortic atherosclerosis. No aneurysm. No enlarged lymph nodes. Reproductive: Uterus and bilateral adnexa are unremarkable. Other: Trace amount of free fluid in the right pelvis. Musculoskeletal: No fracture or acute finding. Scoliosis and degenerative changes of the visualized spine. No bone lesion. IMPRESSION: 1. Acute appendicitis. No extraluminal air to indicate rupture. No periappendiceal abscess. 2. No other acute abnormality within the abdomen or pelvis. 3. Liver morphologic changes suggesting cirrhosis. Hepatic steatosis. 4. Aortic atherosclerosis. Electronically Signed   By: DLajean ManesM.D.   On: 06/08/2022 13:32        Scheduled Meds:  aspirin EC  81 mg Oral Daily   benazepril  5 mg Oral Daily   fenofibrate  160 mg Oral Daily  insulin aspart  0-15 Units Subcutaneous TID WC   insulin aspart  0-5 Units Subcutaneous QHS   pantoprazole  40 mg Oral QODAY   rosuvastatin  40 mg Oral QHS   Continuous Infusions:  sodium chloride 10 mL/hr at 06/08/22 2348   piperacillin-tazobactam (ZOSYN)  IV 12.5 mL/hr at 06/09/22 0145     LOS: 0 days    Time spent: 35 mins    Wyvonnia Dusky, MD [facility] Pager [phone]  If 7PM-7AM, please contact night-coverage [email or web address] 06/09/2022, 7:34 AM

## 2022-06-10 DIAGNOSIS — F03918 Unspecified dementia, unspecified severity, with other behavioral disturbance: Secondary | ICD-10-CM | POA: Diagnosis not present

## 2022-06-10 DIAGNOSIS — K353 Acute appendicitis with localized peritonitis, without perforation or gangrene: Secondary | ICD-10-CM | POA: Diagnosis not present

## 2022-06-10 DIAGNOSIS — N1831 Chronic kidney disease, stage 3a: Secondary | ICD-10-CM | POA: Diagnosis not present

## 2022-06-10 LAB — GLUCOSE, CAPILLARY
Glucose-Capillary: 194 mg/dL — ABNORMAL HIGH (ref 70–99)
Glucose-Capillary: 173 mg/dL — ABNORMAL HIGH (ref 70–99)
Glucose-Capillary: 172 mg/dL — ABNORMAL HIGH (ref 70–99)
Glucose-Capillary: 170 mg/dL — ABNORMAL HIGH (ref 70–99)

## 2022-06-10 LAB — BASIC METABOLIC PANEL
Anion gap: 6 (ref 5–15)
BUN: 68 mg/dL — ABNORMAL HIGH (ref 8–23)
CO2: 22 mmol/L (ref 22–32)
Calcium: 8.6 mg/dL — ABNORMAL LOW (ref 8.9–10.3)
Chloride: 111 mmol/L (ref 98–111)
Creatinine, Ser: 2.38 mg/dL — ABNORMAL HIGH (ref 0.44–1.00)
GFR, Estimated: 20 mL/min — ABNORMAL LOW (ref >60)
Glucose, Bld: 204 mg/dL — ABNORMAL HIGH (ref 70–99)
Potassium: 4.6 mmol/L (ref 3.5–5.1)
Sodium: 139 mmol/L (ref 135–145)

## 2022-06-10 LAB — CBC
HCT: 34 % — ABNORMAL LOW (ref 36.0–46.0)
Hemoglobin: 11.4 g/dL — ABNORMAL LOW (ref 12.0–15.0)
MCH: 33.7 pg (ref 26.0–34.0)
MCHC: 33.5 g/dL (ref 30.0–36.0)
MCV: 100.6 fL — ABNORMAL HIGH (ref 80.0–100.0)
Platelets: 146 10*3/uL — ABNORMAL LOW (ref 150–400)
RBC: 3.38 MIL/uL — ABNORMAL LOW (ref 3.87–5.11)
RDW: 15 % (ref 11.5–15.5)
WBC: 9 10*3/uL (ref 4.0–10.5)
nRBC: 0 % (ref 0.0–0.2)

## 2022-06-10 MED ORDER — PIPERACILLIN-TAZOBACTAM IN DEX 2-0.25 GM/50ML IV SOLN
2.2500 g | Freq: Three times a day (TID) | INTRAVENOUS | Status: DC
  Administered 2022-06-10 – 2022-06-11 (×3): 2.25 g via INTRAVENOUS
  Filled 2022-06-10 (×3): qty 50

## 2022-06-10 MED ORDER — SODIUM CHLORIDE 0.9 % IV SOLN: INTRAVENOUS | Status: DC

## 2022-06-10 NOTE — TOC Initial Note (Addendum)
Transition of Care [facility]) - Initial/Assessment Note    Patient Details  Name: Laurie Harris MRN: [id number] Date of Birth: 09-12-1940  Transition of Care [facility]) CM/SW Contact:    Magnus Ivan, LCSW Phone Number: 06/10/2022, 11:13 AM  Clinical Narrative:                 Per chart review, patient with memory impairment/confusion. CSW spoke with patient's daughter Jamey Ripa via phone regarding SNF recommendation.  Patient currently resides at [facility], is supposed to move to [place] on Friday.  PCP is Dr. Caryl Bis. Douglass Rivers and daughter provide transport. Patient uses a rollator at baseline.  No SNF history. [name] is agreeable to SNF. SNF workup started. PASRR PENDING- info has been uploaded. TOC to continue to follow.    Expected Discharge Plan: Skilled Nursing Facility Barriers to Discharge: Continued Medical Work up   Patient Goals and CMS Choice Patient states their goals for this hospitalization and ongoing recovery are:: SNF CMS Medicare.gov Compare Post Acute Care list provided to:: Patient Represenative (must comment) Choice offered to / list presented to : Adult Children  Expected Discharge Plan and Services Expected Discharge Plan: [place] arrangements for the past 2 months: [place]                                      Prior Living Arrangements/Services Living arrangements for the past 2 months: [place] Lives with:: Facility Resident Patient language and need for interpreter reviewed:: Yes Do you feel safe going back to the place where you live?: Yes      Need for Family Participation in Patient Care: Yes (Comment) Care giver support system in place?: Yes (comment) Current home services: DME Criminal Activity/Legal Involvement Pertinent to Current Situation/Hospitalization: No - Comment as needed  Activities of Daily Living Home Assistive Devices/Equipment: Gilford Rile (specify  type) ADL Screening (condition at time of admission) Patient's cognitive ability adequate to safely complete daily activities?: Yes Is the patient deaf or have difficulty hearing?: No Does the patient have difficulty seeing, even when wearing glasses/contacts?: No Does the patient have difficulty concentrating, remembering, or making decisions?: No Patient able to express need for assistance with ADLs?: Yes Does the patient have difficulty dressing or bathing?: Yes Independently performs ADLs?: Yes (appropriate for developmental age) Does the patient have difficulty walking or climbing stairs?: No Weakness of Legs: Both Weakness of Arms/Hands: None  Permission Sought/Granted Permission sought to share information with : Facility Art therapist granted to share information with : Yes, Verbal Permission Granted (by daughter Jamey Ripa)     Permission granted to share info w AGENCY: SNFs        Emotional Assessment       Orientation: : Fluctuating Orientation (Suspected and/or reported Sundowners) Alcohol / Substance Use: Not Applicable Psych Involvement: No (comment)  Admission diagnosis:  Acute appendicitis [K35.80] Acute appendicitis, unspecified acute appendicitis type [K35.80] Appendicitis [K37] Patient Active Problem List   Diagnosis Date Noted   Appendicitis 06/09/2022   Acute abdominal pain 06/08/2022   Acute appendicitis 06/08/2022   Stage 3a chronic kidney disease (CKD) ([place]) 06/08/2022   Type 2 diabetes mellitus without complication, with long-term current use of insulin ([place]) 06/08/2022   Acute perforated appendicitis 06/08/2022   Bright red blood per rectum 12/19/2021   Bruising 12/19/2021   Fall 12/19/2021  Thoracic back pain 11/03/2021   Diabetic retinopathy ([place]) 09/29/2021   Depression, major, single episode, mild ([place]) 07/05/2020   GERD (gastroesophageal reflux disease) 11/11/2019   Cognitive impairment 10/15/2019   Hirsutism 10/29/2018    Constipation 07/29/2018   Osteoporosis 12/26/2017   Aorto-iliac atherosclerosis ([place]) 09/20/2017   Macrocytosis without anemia 09/20/2017   Carotid artery disease ([place]) 06/19/2016   Acute renal failure superimposed on stage 3a chronic kidney disease ([place]) 05/10/2016   Type II diabetes mellitus with renal manifestations ([place]) 03/16/2015   Benign essential HTN 03/16/2015   Hyperlipidemia 03/16/2015   Carotid artery occlusion without infarction 08/18/2013   PCP:  [name], MD Pharmacy:   [place], [place] - [address] [place] [address] Phone: [phone] Fax: [phone]  [place], [place] - [address] [place] [address] Phone: [phone] Fax: [phone]     Social Determinants of Health (SDOH) Interventions    Readmission Risk Interventions     No data to display

## 2022-06-10 NOTE — Progress Notes (Signed)
PHARMACY NOTE:  ANTIMICROBIAL RENAL DOSAGE ADJUSTMENT  Current antimicrobial regimen includes a mismatch between antimicrobial dosage and estimated renal function.  As per policy approved by the Pharmacy & Therapeutics and Medical Executive Committees, the antimicrobial dosage will be adjusted accordingly.  Current antimicrobial dosage:  Zosyn 3.375 g IV q8h (4-hr infusion)  Indication: Acute appendicitis  Renal Function:  Estimated Creatinine Clearance: 15.6 mL/min (A) (by C-G formula based on SCr of 2.38 mg/dL (H)).    Antimicrobial dosage has been changed to:  Zosyn 2.25 g IV q8h (30-minute infusion)  Additional comments: New AKI. Will continue to monitor renal function and adjust dose as indicated  Thank you for allowing pharmacy to be a part of this patient's care.  Benita Gutter, [facility] 06/10/2022 8:27 AM

## 2022-06-10 NOTE — NC FL2 (Signed)
[place] LEVEL OF CARE SCREENING TOOL     IDENTIFICATION  Patient Name: Laurie Harris Birthdate: 1941/02/03 Sex: female Admission Date (Current Location): 06/08/2022  [facility] and [place] Number:  Engineering geologist and Address:  [facility], [address] [place], [place] [address]      Provider Number: [id number]  Attending Physician Name and Address:  Wyvonnia Dusky, MD  Relative Name and Phone Number:  Durene Fruits (Daughter)   [phone] [facility])    Current Level of Care: Hospital Recommended Level of Care: [place] Prior Approval Number:    Date Approved/Denied:   PASRR Number: PENDING  Discharge Plan: Home    Current Diagnoses: Patient Active Problem List   Diagnosis Date Noted   Appendicitis 06/09/2022   Acute abdominal pain 06/08/2022   Acute appendicitis 06/08/2022   Stage 3a chronic kidney disease (CKD) ([place]) 06/08/2022   Type 2 diabetes mellitus without complication, with long-term current use of insulin ([place]) 06/08/2022   Acute perforated appendicitis 06/08/2022   Bright red blood per rectum 12/19/2021   Bruising 12/19/2021   Fall 12/19/2021   Thoracic back pain 11/03/2021   Diabetic retinopathy ([place]) 09/29/2021   Depression, major, single episode, mild ([place]) 07/05/2020   GERD (gastroesophageal reflux disease) 11/11/2019   Cognitive impairment 10/15/2019   Hirsutism 10/29/2018   Constipation 07/29/2018   Osteoporosis 12/26/2017   Aorto-iliac atherosclerosis ([place]) 09/20/2017   Macrocytosis without anemia 09/20/2017   Carotid artery disease ([place]) 06/19/2016   Acute renal failure superimposed on stage 3a chronic kidney disease ([place]) 05/10/2016   Type II diabetes mellitus with renal manifestations (H[facility]) 03/16/2015   Benign essential HTN 03/16/2015   Hyperlipidemia 03/16/2015   Carotid artery occlusion without infarction 08/18/2013    Orientation RESPIRATION BLADDER Height &  Weight     Self, Time, Situation, Place  Normal Continent Weight: 121 lb 14.6 oz (55.3 kg) Height:  '5\' 6"'$  (167.6 cm)  BEHAVIORAL SYMPTOMS/MOOD NEUROLOGICAL BOWEL NUTRITION STATUS        Diet (heart healthy/carb modified)  AMBULATORY STATUS COMMUNICATION OF NEEDS Skin   Limited Assist Verbally Other (Comment) (incision - abdomen)                       Personal Care Assistance Level of Assistance  Bathing, Feeding, Dressing Bathing Assistance: Limited assistance Feeding assistance: Limited assistance Dressing Assistance: Limited assistance     Functional Limitations Info             SPECIAL CARE FACTORS FREQUENCY  PT (By licensed PT), OT (By licensed OT)     PT Frequency: 5 times per week OT Frequency: 5 times per week            Contractures      Additional Factors Info  Code Status, Allergies Code Status Info: DNR Allergies Info: [place] and Related           Current Medications (06/10/2022):  This is the current hospital active medication list Current Facility-Administered Medications  Medication Dose Route Frequency Provider Last Rate Last Admin   0.9 %  sodium chloride infusion   Intravenous PRN Cox, Amy N, DO 10 mL/hr at 06/08/22 2348 New Bag at 06/08/22 2348   0.9 %  sodium chloride infusion   Intravenous Continuous Piscoya, Jose, MD 75 mL/hr at 06/10/22 0815 New Bag at 06/10/22 0815   acetaminophen (TYLENOL) tablet 650 mg  650 mg Oral Q6H PRN Olean Ree, MD  650 mg at 06/09/22 0522   Or   acetaminophen (TYLENOL) suppository 650 mg  650 mg Rectal Q6H PRN Olean Ree, MD       aspirin EC tablet 81 mg  81 mg Oral Daily Piscoya, Jose, MD   81 mg at 06/10/22 5462   fenofibrate tablet 160 mg  160 mg Oral Daily Piscoya, Jose, MD       hydrALAZINE (APRESOLINE) injection 5 mg  5 mg Intravenous Q8H PRN Piscoya, Jose, MD       HYDROmorphone (DILAUDID) injection 0.5 mg  0.5 mg Intravenous Q4H PRN Piscoya, Jose, MD   0.5 mg at 06/10/22 0814   insulin  aspart (novoLOG) injection 0-15 Units  0-15 Units Subcutaneous TID WC Piscoya, Jacqulyn Bath, MD   3 Units at 06/10/22 0817   insulin aspart (novoLOG) injection 0-5 Units  0-5 Units Subcutaneous QHS Piscoya, Jacqulyn Bath, MD   2 Units at 06/09/22 2140   ondansetron (ZOFRAN) tablet 4 mg  4 mg Oral Q6H PRN Piscoya, Jacqulyn Bath, MD       Or   ondansetron (ZOFRAN) injection 4 mg  4 mg Intravenous Q6H PRN Piscoya, Jose, MD       oxyCODONE (Oxy IR/ROXICODONE) immediate release tablet 5 mg  5 mg Oral Q4H PRN Piscoya, Jose, MD       pantoprazole (PROTONIX) injection 40 mg  40 mg Intravenous Q24H Wyvonnia Dusky, MD   40 mg at 06/10/22 0818   piperacillin-tazobactam (ZOSYN) IVPB 2.25 g  2.25 g Intravenous Q8H Benita Gutter, RPH       rosuvastatin (CRESTOR) tablet 40 mg  40 mg Oral QHS Piscoya, Jose, MD   40 mg at 06/09/22 2141   senna-docusate (Senokot-S) tablet 1 tablet  1 tablet Oral QHS PRN Olean Ree, MD         Discharge Medications: Please see discharge summary for a list of discharge medications.  Relevant Imaging Results:  Relevant Lab Results:   Additional Information SS #: [id number]  [place], LCSW

## 2022-06-10 NOTE — Progress Notes (Signed)
RE: Laurie Harris Date of Birth: 2040-09-17 Date: 06/10/22   To Whom It May Concern:  Please be advised that the above-named patient will require a short-term nursing home stay - anticipated 30 days or less for rehabilitation and strengthening.  The plan is for return home.

## 2022-06-10 NOTE — Progress Notes (Signed)
06/10/2022  Subjective: Patient is 2 Days Post-Op s/p laparoscopic appendectomy.  Patient more oriented today, though still a bit confused.  WBC normal, but her Cr bumped today to 2.38 from 1.11.  Tolerating diet, without nausea, but still reports lower abdominal discomfort.  Vital signs: Temp:  [97.6 F (36.4 C)-98.4 F (36.9 C)] 98.1 F (36.7 C) (10/08 0724) Pulse Rate:  [91-106] 106 (10/08 0724) Resp:  [14-18] 14 (10/08 0724) BP: (119-135)/(54-64) 135/64 (10/08 0724) SpO2:  [94 %-100 %] 100 % (10/08 0724)   Intake/Output: 10/07 0701 - 10/08 0700 In: 574.9 [P.O.:420; IV Piggyback:154.9] Out: -  Last BM Date : 06/07/22  Physical Exam: Constitutional:  No acute distress Abdomen:  soft, non-distended, appropriately tender.  Drain with serosanguinous fluid.  Incisions clean, dry, intact with some ecchymosis.  Labs:  Recent Labs    06/09/22 0513 06/10/22 0406  WBC 9.8 9.0  HGB 12.1 11.4*  HCT 36.4 34.0*  PLT 138* 146*   Recent Labs    06/09/22 0513 06/10/22 0406  NA 140 139  K 4.2 4.6  CL 111 111  CO2 22 22  GLUCOSE 165* 204*  BUN 33* 68*  CREATININE 1.11* 2.38*  CALCIUM 9.0 8.6*   No results for input(s): "LABPROT", "INR" in the last 72 hours.  Imaging: No results found.  Assessment/Plan: This is a 81 y.o. female s/p lap appy  --Patient has AKI now.  I have d/c'd toradol and have started her on IV fluids.  Repeat BMP tomorrow. --OK to continue diet, other pain medications. --Continue IV abx.   --Possibly could plan on d/c tomorrow but all depends on her AKI.  PT also recommending STR.   Melvyn Neth, [facility]

## 2022-06-10 NOTE — Plan of Care (Signed)

## 2022-06-10 NOTE — Evaluation (Signed)
Physical Therapy Evaluation Patient Details Name: Laurie Harris MRN: [id number] DOB: [birth year] Today's Date: 06/10/2022  History of Present Illness  presented to ER secondary to abdominal pain; admitted for management of acute perforated diverticulitis, s/p laparoscopic appendectomy (06/08/22) with JP drain placement.  Clinical Impression  Patient resting in bed upon arrival to room; visibly fatigued, but awake and oriented to self, location.  Follows simple commands, pleasant and cooperative; remains generally confused to more complex information.  Endorses generalized pain/soreness to abdomen, FACES 4/10 (meds provided prior to session).  Globally weak and deconditioned throughout all extremities, but generally functional for basic transfers and gait efforts.  Currently requiring mod assist for log-rolling and bed mobility; cga/min assist for unsupported sitting balance (due to R lateral lean); min/mod assist for sit/stand, standing balance and bed/chair with RW.  Step by step cuing for task sequencing, walker management and overall safety.  Visibly fatigued with effort; gait deferred this date as result. Would benefit from skilled PT to address above deficits and promote optimal return to PLOF.; recommend transition to STR upon discharge from acute hospitalization.        Recommendations for follow up therapy are one component of a multi-disciplinary discharge planning process, led by the attending physician.  Recommendations may be updated based on patient status, additional functional criteria and insurance authorization.  Follow Up Recommendations Skilled nursing-short term rehab (<3 hours/day) Can patient physically be transported by private vehicle: Yes    Assistance Recommended at Discharge Frequent or constant Supervision/Assistance  Patient can return home with the following  A lot of help with walking and/or transfers;A lot of help with bathing/dressing/bathroom    Equipment  Recommendations Rolling walker (2 wheels);BSC/3in1  Recommendations for Other Services       Functional Status Assessment Patient has had a recent decline in their functional status and demonstrates the ability to make significant improvements in function in a reasonable and predictable amount of time.     Precautions / Restrictions Precautions Precautions: Fall Restrictions Weight Bearing Restrictions: No      Mobility  Bed Mobility Overal bed mobility: Needs Assistance Bed Mobility: Supine to Sit, Rolling Rolling: Mod assist   Supine to sit: Mod assist     General bed mobility comments: educated in log-rolling; mod assist for log-rolling and supine/sit    Transfers Overall transfer level: Needs assistance Equipment used: Rolling walker (2 wheels) Transfers: Sit to/from Stand, Bed to chair/wheelchair/BSC Sit to Stand: Min assist, Mod assist Stand pivot transfers: Min assist, Mod assist         General transfer comment: assist for lift off, standing balance; hand-over-hand for walker position/management    Ambulation/Gait               General Gait Details: deferred due to patient fatigue  Stairs            Wheelchair Mobility    Modified Rankin (Stroke Patients Only)       Balance Overall balance assessment: Needs assistance Sitting-balance support: No upper extremity supported, Feet supported Sitting balance-Leahy Scale: Fair Sitting balance - Comments: lists R with fatigue, min assist to correct Postural control: Right lateral lean   Standing balance-Leahy Scale: Poor Standing balance comment: min/mod assist +1 with RW to maintain                             Pertinent Vitals/Pain Pain Assessment Pain Assessment: Faces Faces Pain Scale: Hurts little more Pain  Location: abdomen Pain Descriptors / Indicators: Aching, Grimacing, Guarding Pain Intervention(s): Limited activity within patient's tolerance, Monitored during  session, Repositioned    Home Living Family/patient expects to be discharged to:: Assisted living                 Home Equipment: Rollator (4 wheels) Additional Comments: Resident of [facility]; per daughter, planned transition to [place] in upcoming week    Prior Function Prior Level of Function : Independent/Modified Independent             Mobility Comments: Sup/mod indep with 4WRW for household mobilization and ambulation to/from dining hall.  Denies recent fall history.       Hand Dominance   Dominant Hand: Right    Extremity/Trunk Assessment   Upper Extremity Assessment Upper Extremity Assessment: Generalized weakness    Lower Extremity Assessment Lower Extremity Assessment: Generalized weakness (grossly 4-/5 throughout bilat LEs)    Cervical / Trunk Assessment Cervical / Trunk Assessment:  (scoliotic curvature towards L)  Communication   Communication: No difficulties  Cognition Arousal/Alertness: Awake/alert Behavior During Therapy: WFL for tasks assessed/performed                                   General Comments: Oriented to self, location and general reason for admission; general confusion persists, limited ability to integrate/carry-over new information        General Comments      Exercises     Assessment/Plan    PT Assessment Patient needs continued PT services  PT Problem List Decreased strength;Decreased activity tolerance;Decreased balance;Decreased mobility;Decreased cognition;Decreased knowledge of use of DME;Decreased safety awareness       PT Treatment Interventions DME instruction;Gait training;Functional mobility training;Therapeutic activities;Therapeutic exercise;Patient/family education;Balance training    PT Goals (Current goals can be found in the Care Plan section)  Acute Rehab PT Goals Patient Stated Goal: to get strength back, to clear confusion PT Goal Formulation: With patient/family Time For  Goal Achievement: 06/24/22 Potential to Achieve Goals: Fair    Frequency Min 2X/week     Co-evaluation               AM-PAC PT "6 Clicks" Mobility  Outcome Measure Help needed turning from your back to your side while in a flat bed without using bedrails?: A Little Help needed moving from lying on your back to sitting on the side of a flat bed without using bedrails?: A Lot Help needed moving to and from a bed to a chair (including a wheelchair)?: A Lot Help needed standing up from a chair using your arms (e.g., wheelchair or bedside chair)?: A Lot Help needed to walk in hospital room?: A Lot Help needed climbing 3-5 steps with a railing? : A Lot 6 Click Score: 13    End of Session   Activity Tolerance: Patient limited by fatigue Patient left: in chair;with call bell/phone within reach;with chair alarm set;with family/visitor present Nurse Communication: Mobility status PT Visit Diagnosis: Muscle weakness (generalized) (M62.81);Difficulty in walking, not elsewhere classified (R26.2)    Time: 1010-1035 PT Time Calculation (min) (ACUTE ONLY): 25 min   Charges:   PT Evaluation $PT Eval Moderate Complexity: 1 Mod          Truda Staub H. Owens Shark, PT, DPT, NCS 06/10/22, 1:40 PM [phone]

## 2022-06-10 NOTE — Plan of Care (Signed)

## 2022-06-10 NOTE — Progress Notes (Signed)
PROGRESS NOTE    Laurie Harris  YTK:[id number] DOB: 05-04-1941 DOA: 06/08/2022 PCP: [name], MD   Assessment & Plan:   Principal Problem:   Acute appendicitis Active Problems:   Benign essential HTN   Hyperlipidemia   Carotid artery disease ([place])   Osteoporosis   Constipation   Depression, major, single episode, mild ([place])   Diabetic retinopathy ([place])   Stage 3a chronic kidney disease (CKD) ([place])   Type 2 diabetes mellitus without complication, with long-term current use of insulin ([place])   Acute perforated appendicitis   Appendicitis  Assessment and Plan: Acute perforated appendicitis: s/p lap appendectomy on 06/08/22 as per gen surg. Continue on IV zosyn. Zofran prn. Oxycodone, dilaudid prn for pain. Gen surg following and recs apprec  Likely dementia: continue w/ supportive care. Likely a component acute hospital delirium    CKDIIIa: Cr is trending up today. D/c NSAIDs. Started on IVFs.   DM2: HbA1c 6.5, well controlled. Continue on SSI w/ accuchecks    Diabetic retinopathy: continue w/ outpatient f/u w/ ophthalmologist   HLD: continue on statin    HTN: holding ACE-I. Hydralazine prn   Liver cirrhosis: as noted on CT scan. No hx of alcohol abuse/use. Possibly secondary to NASH. Will need outpatient GI f/u. Pt's daughter is aware and verbalized her understanding   Thrombocytopenia: likely secondary to cirrhosis   DVT prophylaxis: SCDs Code Status: DNR Family Communication: discussed pt's care w/ pt's daughter, Jamey Ripa, and answered her questions  Disposition Plan: likely d/c to SNF  Level of care: Telemetry Medical  Status is: Inpatient Remains inpatient appropriate because: severity of illness       Consultants:  Gen surg   Procedures:   Antimicrobials: zosyn   Subjective: Pt c/o being in an uncomfortable position  Objective: Vitals:   06/09/22 1544 06/09/22 2132 06/10/22 0511 06/10/22 0724  BP: (!) 123/57 (!) 119/59 (!) 132/54  135/64  Pulse: 99 91 97 (!) 106  Resp: '18 16 16 14  '$ Temp: 98.4 F (36.9 C) 98.2 F (36.8 C) 97.6 F (36.4 C) 98.1 F (36.7 C)  TempSrc: Oral Oral Oral Oral  SpO2: 97% 94% 96% 100%  Weight:      Height:        Intake/Output Summary (Last 24 hours) at 06/10/2022 0739 Last data filed at 06/10/2022 0505 Gross per 24 hour  Intake 574.9 ml  Output --  Net 574.9 ml   Filed Weights   06/08/22 1058 06/08/22 2240  Weight: 53.5 kg 55.3 kg    Examination:  General exam: Appears calm but uncomfortable  Respiratory system: clear breath sounds b/l Cardiovascular system: S1/S2+. No rubs or clicks   Gastrointestinal system: Abd is soft, NT, ND & hypoactive bowel sounds Central nervous system: Alert and awake. Moves all extremities Psychiatry: judgement and insight appears poor. Flat mood and affect    Data Reviewed: I have personally reviewed following labs and imaging studies  CBC: Recent Labs  Lab 06/08/22 1106 06/09/22 0513 06/10/22 0406  WBC 10.7* 9.8 9.0  HGB 13.2 12.1 11.4*  HCT 40.6 36.4 34.0*  MCV 98.8 99.5 100.6*  PLT 138* 138* 751*   Basic Metabolic Panel: Recent Labs  Lab 06/08/22 1106 06/09/22 0513 06/10/22 0406  NA 142 140 139  K 4.6 4.2 4.6  CL 109 111 111  CO2 '22 22 22  '$ GLUCOSE 196* 165* 204*  BUN 30* 33* 68*  CREATININE 1.13* 1.11* 2.38*  CALCIUM 9.9 9.0 8.6*  MG  --  1.8  --    GFR: Estimated Creatinine Clearance: 15.6 mL/min (A) (by C-G formula based on SCr of 2.38 mg/dL (H)). Liver Function Tests: Recent Labs  Lab 06/08/22 1106  AST 68*  ALT 52*  ALKPHOS 89  BILITOT 1.3*  PROT 7.2  ALBUMIN 3.6   Recent Labs  Lab 06/08/22 1106  LIPASE 33   No results for input(s): "AMMONIA" in the last 168 hours. Coagulation Profile: No results for input(s): "INR", "PROTIME" in the last 168 hours. Cardiac Enzymes: No results for input(s): "CKTOTAL", "CKMB", "CKMBINDEX", "TROPONINI" in the last 168 hours. BNP (last 3 results) No results for  input(s): "PROBNP" in the last 8760 hours. HbA1C: No results for input(s): "HGBA1C" in the last 72 hours. CBG: Recent Labs  Lab 06/08/22 2152 06/09/22 0747 06/09/22 1146 06/09/22 1701 06/09/22 2103  GLUCAP 151* 145* 179* 216* 202*   Lipid Profile: No results for input(s): "CHOL", "HDL", "LDLCALC", "TRIG", "CHOLHDL", "LDLDIRECT" in the last 72 hours. Thyroid Function Tests: No results for input(s): "TSH", "T4TOTAL", "FREET4", "T3FREE", "THYROIDAB" in the last 72 hours. Anemia Panel: No results for input(s): "VITAMINB12", "FOLATE", "FERRITIN", "TIBC", "IRON", "RETICCTPCT" in the last 72 hours. Sepsis Labs: Recent Labs  Lab 06/08/22 1106  LATICACIDVEN 1.4    No results found for this or any previous visit (from the past 240 hour(s)).       Radiology Studies: CT ABDOMEN PELVIS W CONTRAST  Result Date: 06/08/2022 CLINICAL DATA:  Right lower quadrant pain. Reported history of appendicitis that resolved without surgery. EXAM: CT ABDOMEN AND PELVIS WITH CONTRAST TECHNIQUE: Multidetector CT imaging of the abdomen and pelvis was performed using the standard protocol following bolus administration of intravenous contrast. RADIATION DOSE REDUCTION: This exam was performed according to the departmental dose-optimization program which includes automated exposure control, adjustment of the mA and/or kV according to patient size and/or use of iterative reconstruction technique. CONTRAST:  17m OMNIPAQUE IOHEXOL 300 MG/ML  SOLN COMPARISON:  12/08/2019 FINDINGS: Lower chest: No acute abnormality. Hepatobiliary: Decreased liver attenuation consistent with fatty infiltration. Central volume loss. Relative enlargement of the lateral segment of the left lobe and caudate lobe. Subtle surface nodularity. No liver mass. Status post cholecystectomy. No bile duct dilation. Pancreas: Unremarkable. No pancreatic ductal dilatation or surrounding inflammatory changes. Spleen: Normal in size without focal  abnormality. Adrenals/Urinary Tract: Normal adrenal glands. Kidneys normal in size, orientation and position with symmetric enhancement and excretion. Subcentimeter low-attenuation lesion, lateral mid to upper pole of the left kidney, consistent with a cyst. No follow-up recommended. No other renal masses or lesions, no stones and no hydronephrosis. Normal ureters. Bladder unremarkable. Stomach/Bowel: Appendix is dilated to 1.2 cm. Small appendical lith. Adjacent inflammatory changes. No extraluminal air. No defined fluid collection to indicate an abscess. Normal stomach. Small bowel and colon are normal in caliber. No wall thickening. No inflammation. Numerous colonic diverticula, mostly on the left. No diverticulitis. Mild to moderate increase in the colonic stool burden. Increased rectal stool. Vascular/Lymphatic: Dense aortic atherosclerosis. No aneurysm. No enlarged lymph nodes. Reproductive: Uterus and bilateral adnexa are unremarkable. Other: Trace amount of free fluid in the right pelvis. Musculoskeletal: No fracture or acute finding. Scoliosis and degenerative changes of the visualized spine. No bone lesion. IMPRESSION: 1. Acute appendicitis. No extraluminal air to indicate rupture. No periappendiceal abscess. 2. No other acute abnormality within the abdomen or pelvis. 3. Liver morphologic changes suggesting cirrhosis. Hepatic steatosis. 4. Aortic atherosclerosis. Electronically Signed   By: DLajean ManesM.D.   On: 06/08/2022 13:32  Scheduled Meds:  aspirin EC  81 mg Oral Daily   fenofibrate  160 mg Oral Daily   insulin aspart  0-15 Units Subcutaneous TID WC   insulin aspart  0-5 Units Subcutaneous QHS   pantoprazole (PROTONIX) IV  40 mg Intravenous Q24H   rosuvastatin  40 mg Oral QHS   Continuous Infusions:  sodium chloride 10 mL/hr at 06/08/22 2348   sodium chloride     piperacillin-tazobactam (ZOSYN)  IV Stopped (06/10/22 0216)     LOS: 1 day    Time spent: 33  mins    Wyvonnia Dusky, MD [facility] Pager [phone]  If 7PM-7AM, please contact night-coverage [email or web address] 06/10/2022, 7:39 AM

## 2022-06-11 ENCOUNTER — Telehealth: Payer: Self-pay

## 2022-06-11 LAB — BASIC METABOLIC PANEL
Anion gap: 9 (ref 5–15)
BUN: 80 mg/dL — ABNORMAL HIGH (ref 8–23)
CO2: 21 mmol/L — ABNORMAL LOW (ref 22–32)
Calcium: 8.8 mg/dL — ABNORMAL LOW (ref 8.9–10.3)
Chloride: 111 mmol/L (ref 98–111)
Creatinine, Ser: 1.63 mg/dL — ABNORMAL HIGH (ref 0.44–1.00)
GFR, Estimated: 31 mL/min — ABNORMAL LOW (ref >60)
Glucose, Bld: 223 mg/dL — ABNORMAL HIGH (ref 70–99)
Potassium: 4.5 mmol/L (ref 3.5–5.1)
Sodium: 141 mmol/L (ref 135–145)

## 2022-06-11 LAB — CBC
HCT: 38.3 % (ref 36.0–46.0)
Hemoglobin: 12.6 g/dL (ref 12.0–15.0)
MCH: 32.6 pg (ref 26.0–34.0)
MCHC: 32.9 g/dL (ref 30.0–36.0)
MCV: 99.2 fL (ref 80.0–100.0)
Platelets: 158 10*3/uL (ref 150–400)
RBC: 3.86 MIL/uL — ABNORMAL LOW (ref 3.87–5.11)
RDW: 14.7 % (ref 11.5–15.5)
WBC: 8.1 10*3/uL (ref 4.0–10.5)
nRBC: 0 % (ref 0.0–0.2)

## 2022-06-11 LAB — GLUCOSE, CAPILLARY
Glucose-Capillary: 201 mg/dL — ABNORMAL HIGH (ref 70–99)
Glucose-Capillary: 202 mg/dL — ABNORMAL HIGH (ref 70–99)
Glucose-Capillary: 195 mg/dL — ABNORMAL HIGH (ref 70–99)
Glucose-Capillary: 287 mg/dL — ABNORMAL HIGH (ref 70–99)

## 2022-06-11 LAB — MAGNESIUM: Magnesium: 2.2 mg/dL (ref 1.7–2.4)

## 2022-06-11 LAB — PHOSPHORUS: Phosphorus: 3.7 mg/dL (ref 2.5–4.6)

## 2022-06-11 MED ORDER — PIPERACILLIN-TAZOBACTAM 3.375 G IVPB
3.3750 g | Freq: Three times a day (TID) | INTRAVENOUS | Status: DC
  Administered 2022-06-11 – 2022-06-12 (×3): 3.375 g via INTRAVENOUS
  Filled 2022-06-11 (×2): qty 50

## 2022-06-11 MED ORDER — HYDRALAZINE HCL 20 MG/ML IJ SOLN: 10.0000 mg | Freq: Three times a day (TID) | INTRAMUSCULAR | Status: AC | PRN

## 2022-06-11 NOTE — Progress Notes (Signed)
[facility] Hospital Day(s): 2.   Post op day(s): 3 Days Post-Op.   Interval History:  Patient seen and examined No acute events or new complaints overnight.  Patient reports she has LLQ abdominal pain otherwise doing okay No fever, chills, nausea, emesis, distension, CP, SOB She remains without leukocytosis; 8.1K Hgb improved to 12.6 AKI now improving; sCr - 1.63; UO - unmeasured amounts No electrolyte derangements Surgical drain with 345 ccs; serous She is on HH/CM diet Having bowel function She continues on Zosyn  Vital signs in last 24 hours: [min-max] current  Temp:  [97.6 F (36.4 C)-98 F (36.7 C)] 97.6 F (36.4 C) (10/09 0720) Pulse Rate:  [90-95] 92 (10/09 0720) Resp:  [12-18] 16 (10/09 0720) BP: (132-180)/(57-77) 176/77 (10/09 0720) SpO2:  [94 %-100 %] 94 % (10/09 0720)     Height: '5\' 6"'$  (167.6 cm) Weight: 55.3 kg BMI (Calculated): 19.69   Intake/Output last 2 shifts:  10/08 0701 - 10/09 0700 In: 520 [P.O.:420; IV Piggyback:100] Out: 345 [Drains:345]   Physical Exam:  Constitutional: alert, cooperative and no distress  Respiratory: breathing non-labored at rest  Cardiovascular: regular rate and sinus rhythm  Gastrointestinal: Soft, LLQ soreness at drain site, non-distended, no tympany, no rebound/guarding. Surgical drain in LLQ; output is grossly serous  Integumentary: Laparoscopic incisions are CDI with dermabond, no erythema, no drainage. She does have ecchymosis   Labs:     Latest Ref Rng & Units 06/11/2022    3:58 AM 06/10/2022    4:06 AM 06/09/2022    5:13 AM  CBC  WBC 4.0 - 10.5 K/uL 8.1  9.0  9.8   Hemoglobin 12.0 - 15.0 g/dL 12.6  11.4  12.1   Hematocrit 36.0 - 46.0 % 38.3  34.0  36.4   Platelets 150 - 400 K/uL 158  146  138       Latest Ref Rng & Units 06/11/2022    3:58 AM 06/10/2022    4:06 AM 06/09/2022    5:13 AM  CMP  Glucose 70 - 99 mg/dL 223  204  165   BUN 8 - 23 mg/dL 80  68  33   Creatinine  0.44 - 1.00 mg/dL 1.63  2.38  1.11   Sodium 135 - 145 mmol/L 141  139  140   Potassium 3.5 - 5.1 mmol/L 4.5  4.6  4.2   Chloride 98 - 111 mmol/L 111  111  111   CO2 22 - 32 mmol/L '21  22  22   '$ Calcium 8.9 - 10.3 mg/dL 8.8  8.6  9.0      Imaging studies: No new pertinent imaging studies   Assessment/Plan: 81 y.o. female with improving AKI 3 Days Post-Op s/p laparoscopic appendectomy for acute appendicitis, complicated by pertinent comorbidities includinghistory of previous hemorrhagic CVA with left sided weakness, HTN, HLD, T2DM, and CKD III.   - Okay to continue diet as tolerated    - Gentle IVF resuscitation  - Continue IV Abx (Zosyn); transition to PO Augmentin at discharge   - Continue surgical drain; monitor and record output. She will discharge with this.  - Monitor abdominal examination; on-going bowel function  - Pain control prn; antiemetics prn  - Monitor renal function; improving  - Continue ambulation as tolerated; PT on-board, recommendations are SNF  - Further management per primary service   - Discharge Planning: AKI now improving otherwise doing well, tolerating diet, having bowel function, drain serous. She is stable for  discharge from surgical perspective. Plan for DC to SNF. I will update follow up appointment and DC instructions   All of the above findings and recommendations were discussed with the patient, and the medical team, and all of patient's questions were answered to her expressed satisfaction.  -- Edison Simon, PA-C [facility] 06/11/2022, 7:28 AM M-F: 7am - 4pm

## 2022-06-11 NOTE — TOC Progression Note (Signed)
Transition of Care [facility]) - Progression Note    Patient Details  Name: Laurie Harris MRN: [id number] Date of Birth: 1940-09-04  Transition of Care [facility]) CM/SW Contact  Beverly Sessions, RN Phone Number: 06/11/2022, 9:55 AM  Clinical Narrative:     Laurie Harris [id number]   Expected Discharge Plan: [place] Barriers to Discharge: Continued Medical Work up  Expected Discharge Plan and Services Expected Discharge Plan: [place] arrangements for the past 2 months: [place]                                       Social Determinants of Health (SDOH) Interventions    Readmission Risk Interventions     No data to display

## 2022-06-11 NOTE — TOC Progression Note (Addendum)
Transition of Care [facility]) - Progression Note    Patient Details  Name: Laurie Harris MRN: [id number] Date of Birth: 1941-03-30  Transition of Care [facility]) CM/SW Contact  Beverly Sessions, RN Phone Number: 06/11/2022, 1:29 PM  Clinical Narrative:     Bed offers presented to daughter. She accepts bed at [facility].  Accepted in [place], notified michelle at [facility].   Per Sharyn Lull at [place] there is a 50/50 chance she will have a female bed to tomorrow.  Notified daughter and she is going to call me with a back up bed incase [facility] is not able to take her tomorrow   Per daughter [facility] would be her second choice  Expected Discharge Plan: [place] Barriers to Discharge: Continued Medical Work up  Expected Discharge Plan and Services Expected Discharge Plan: [place] arrangements for the past 2 months: [place]                                       Social Determinants of Health (SDOH) Interventions    Readmission Risk Interventions     No data to display

## 2022-06-11 NOTE — Inpatient Diabetes Management (Signed)
Inpatient Diabetes Program Recommendations  AACE/ADA: New Consensus Statement on Inpatient Glycemic Control (2015)  Target Ranges:  Prepandial:   less than 140 mg/dL      Peak postprandial:   less than 180 mg/dL (1-2 hours)      Critically ill patients:  140 - 180 mg/dL   Lab Results  Component Value Date   GLUCAP 202 (H) 06/11/2022   HGBA1C 6.5 04/25/2022    Review of Glycemic Control  Latest Reference Range & Units 06/11/22 08:34 06/11/22 11:25  Glucose-Capillary 70 - 99 mg/dL 201 (H) 202 (H)  (H): Data is abnormally high  Diabetes history: DM2 Outpatient Diabetes medications: 75/25-14 units BID Current orders for Inpatient glycemic control:  Novolog 0-15 units TID and 0-5 units QHS  Inpatient Diabetes Program Recommendations:    Semglee 8 units QD (0.15 units x 55.3kg)  Will continue to follow while inpatient.  Thank you, Reche Dixon, MSN, [place] Diabetes Coordinator Inpatient Diabetes Program [phone] (team pager from 8a-5p)

## 2022-06-11 NOTE — Progress Notes (Signed)
PROGRESS NOTE    LETTI TOWELL  BZJ:[id number] DOB: February 26, 1941 DOA: 06/08/2022 PCP: [name], MD   Assessment & Plan:   Principal Problem:   Acute appendicitis Active Problems:   Benign essential HTN   Hyperlipidemia   Carotid artery disease ([place])   Osteoporosis   Constipation   Depression, major, single episode, mild ([place])   Diabetic retinopathy ([place])   Stage 3a chronic kidney disease (CKD) ([place])   Type 2 diabetes mellitus without complication, with long-term current use of insulin ([place])   Acute perforated appendicitis   Appendicitis  Assessment and Plan: Acute perforated appendicitis: s/p lap appendectomy on 06/08/22 as per gen surg. Continue on IV zosyn and will d/c home w/ po augmentin as per gen surg. Oxycodone, dilaudid prn. Gen surg following and recs apprec  Likely dementia: continue w/ supportive care. Likely a component of acute hospital delirium but mental status closer to baseline today    CKDIIIa:  Cr is trending down today. Continue on IVFs. No NSAIDs  DM2: well controlled, HbA1c 6.5. Continue on SSI w/ accuchecks    Diabetic retinopathy: continue w/ outpatient f/u w/ ophthalmologist   HLD: continue on statin    HTN: continue to hold ACE-I. Hydralazine prn   Liver cirrhosis: as noted on CT scan. No hx of alcohol abuse/use. Possibly secondary to NASH. Will need outpatient GI f/u. Pt's daughter is aware and verbalized her understanding   Thrombocytopenia: WNL today   DVT prophylaxis: SCDs Code Status: DNR Family Communication: discussed pt's care w/ pt's daughter, Jamey Ripa, and answered her questions  Disposition Plan: likely d/c to SNF  Level of care: Telemetry Medical  Status is: Inpatient Remains inpatient appropriate because: waiting on SNF placement       Consultants:  Gen surg   Procedures:   Antimicrobials: zosyn   Subjective: Pt c/o having burnt toast   Objective: Vitals:   06/10/22 1513 06/10/22 1928 06/11/22 0542  06/11/22 0720  BP: (!) 132/57 (!) 145/58 (!) 180/75 (!) 176/77  Pulse: 95 90 94 92  Resp: '12 16 18 16  '$ Temp: 97.6 F (36.4 C) 98 F (36.7 C) 97.6 F (36.4 C) 97.6 F (36.4 C)  TempSrc: Oral   Oral  SpO2: 100% 95% 98% 94%  Weight:      Height:        Intake/Output Summary (Last 24 hours) at 06/11/2022 0734 Last data filed at 06/11/2022 0303 Gross per 24 hour  Intake 520 ml  Output 345 ml  Net 175 ml   Filed Weights   06/08/22 1058 06/08/22 2240  Weight: 53.5 kg 55.3 kg    Examination:  General exam: Appears frustrated  Respiratory system: clear breath sounds b/l. No wheezes, rales  Cardiovascular system: S1 & S2+. No gallops or rubs Gastrointestinal system: Abd is soft, NT, ND & normal bowel sounds  Central nervous system: Alert and awake. Moves all extremities  Psychiatry: judgement and insight are close to baseline. Frustrated mood and affect     Data Reviewed: I have personally reviewed following labs and imaging studies  CBC: Recent Labs  Lab 06/08/22 1106 06/09/22 0513 06/10/22 0406 06/11/22 0358  WBC 10.7* 9.8 9.0 8.1  HGB 13.2 12.1 11.4* 12.6  HCT 40.6 36.4 34.0* 38.3  MCV 98.8 99.5 100.6* 99.2  PLT 138* 138* 146* 017   Basic Metabolic Panel: Recent Labs  Lab 06/08/22 1106 06/09/22 0513 06/10/22 0406 06/11/22 0358  NA 142 140 139 141  K 4.6 4.2 4.6 4.5  CL 109 111 111 111  CO2 '22 22 22 '$ 21*  GLUCOSE 196* 165* 204* 223*  BUN 30* 33* 68* 80*  CREATININE 1.13* 1.11* 2.38* 1.63*  CALCIUM 9.9 9.0 8.6* 8.8*  MG  --  1.8  --  2.2  PHOS  --   --   --  3.7   GFR: Estimated Creatinine Clearance: 22.8 mL/min (A) (by C-G formula based on SCr of 1.63 mg/dL (H)). Liver Function Tests: Recent Labs  Lab 06/08/22 1106  AST 68*  ALT 52*  ALKPHOS 89  BILITOT 1.3*  PROT 7.2  ALBUMIN 3.6   Recent Labs  Lab 06/08/22 1106  LIPASE 33   No results for input(s): "AMMONIA" in the last 168 hours. Coagulation Profile: No results for input(s): "INR",  "PROTIME" in the last 168 hours. Cardiac Enzymes: No results for input(s): "CKTOTAL", "CKMB", "CKMBINDEX", "TROPONINI" in the last 168 hours. BNP (last 3 results) No results for input(s): "PROBNP" in the last 8760 hours. HbA1C: No results for input(s): "HGBA1C" in the last 72 hours. CBG: Recent Labs  Lab 06/09/22 2103 06/10/22 0745 06/10/22 1155 06/10/22 1655 06/10/22 2111  GLUCAP 202* 194* 173* 172* 170*   Lipid Profile: No results for input(s): "CHOL", "HDL", "LDLCALC", "TRIG", "CHOLHDL", "LDLDIRECT" in the last 72 hours. Thyroid Function Tests: No results for input(s): "TSH", "T4TOTAL", "FREET4", "T3FREE", "THYROIDAB" in the last 72 hours. Anemia Panel: No results for input(s): "VITAMINB12", "FOLATE", "FERRITIN", "TIBC", "IRON", "RETICCTPCT" in the last 72 hours. Sepsis Labs: Recent Labs  Lab 06/08/22 1106  LATICACIDVEN 1.4    No results found for this or any previous visit (from the past 240 hour(s)).       Radiology Studies: No results found.      Scheduled Meds:  aspirin EC  81 mg Oral Daily   fenofibrate  160 mg Oral Daily   insulin aspart  0-15 Units Subcutaneous TID WC   insulin aspart  0-5 Units Subcutaneous QHS   pantoprazole (PROTONIX) IV  40 mg Intravenous Q24H   rosuvastatin  40 mg Oral QHS   Continuous Infusions:  sodium chloride 10 mL/hr at 06/08/22 2348   sodium chloride 75 mL/hr at 06/11/22 0309   piperacillin-tazobactam (ZOSYN)  IV Stopped (06/11/22 0105)     LOS: 2 days    Time spent: 30 mins    Wyvonnia Dusky, MD [facility] Pager [phone]  If 7PM-7AM, please contact night-coverage [email or web address] 06/11/2022, 7:34 AM

## 2022-06-11 NOTE — Progress Notes (Signed)
OT Cancellation Note  Patient Details Name: Laurie Harris MRN: [id number] DOB: 03/11/1941   Cancelled Treatment:    Reason Eval/Treat Not Completed: Patient declined, no reason specified;Fatigue/lethargy limiting ability to participate. Consult received, chart reviewed. Pt in recliner, brother present. Pt endorsing feeling very weak and fatigued, ultimately reclining OT at this time and wishing to eat lunch first (on it's way). Pt agreeable to OT re-attempting at later time.    Ardeth Perfect., MPH, MS, OTR/L ascom [phone] 06/11/22, 1:48 PM

## 2022-06-11 NOTE — TOC Progression Note (Signed)
Transition of Care [facility]) - Progression Note    Patient Details  Name: Laurie Harris MRN: [id number] Date of Birth: 06/17/1941  Transition of Care [facility]) CM/SW Contact  Beverly Sessions, RN Phone Number: 06/11/2022, 10:01 AM  Clinical Narrative:      No bed offers.  I have reached out to all of the pending facilities in the [place] to request review  Expected Discharge Plan: [place] Barriers to Discharge: Continued Medical Work up  Expected Discharge Plan and Services Expected Discharge Plan: [place] arrangements for the past 2 months: [place]                                       Social Determinants of Health (SDOH) Interventions    Readmission Risk Interventions     No data to display

## 2022-06-11 NOTE — Telephone Encounter (Signed)
I called and spoke with the patients daughter and informed her that the [facility] was ready and available for pickup, she asked me to fax these forms to [place].  I faxed them and confirmation given.  Djuan Talton,cma

## 2022-06-11 NOTE — Discharge Instructions (Signed)
In addition to included general post-operative instructions,  Diet: Resume home diet.   Activity: No heavy lifting >20 pounds (children, pets, laundry, garbage) or strenuous activity for 4 weeks, but light activity and walking are encouraged. Do not drive or drink alcohol if taking narcotic pain medications or having pain that might distract from driving. Okay to work with physical/occupational therapies.   Wound care: If you can keep drain site covered/water proof, you may shower/get incision wet with soapy water and pat dry (do not rub incisions), but no baths or submerging incision underwater until follow-up.   Drain: Monitor and record drain output daily; hand out given. Please bring this to follow up appointment. Will plan on drain removal at first follow up.   Medications: Resume all home medications. For mild to moderate pain: acetaminophen (Tylenol) or ibuprofen/naproxen (if no kidney disease). Combining Tylenol with alcohol can substantially increase your risk of causing liver disease. Narcotic pain medications, if prescribed, can be used for severe pain, though may cause nausea, constipation, and drowsiness. Do not combine Tylenol and Percocet (or similar) within a 6 hour period as Percocet (and similar) contain(s) Tylenol. If you do not need the narcotic pain medication, you do not need to fill the prescription.  Call office [phone]) at any time if any questions, worsening pain, fevers/chills, bleeding, drainage from incision site, or other concerns.

## 2022-06-11 NOTE — Evaluation (Signed)
Occupational Therapy Evaluation Patient Details Name: Laurie Harris MRN: [id number] DOB: 14-Jan-1941 Today's Date: 06/11/2022   History of Present Illness presented to ER secondary to abdominal pain; admitted for management of acute perforated diverticulitis, s/p laparoscopic appendectomy (06/08/22) with JP drain placement.   Clinical Impression   Pt was seen for OT evaluation this date, 3 days post-op. Prior to hospital admission, pt was using a rollator for mobility and indep with ADL. No family present on 2nd attempt to help verify PLOF. Pt lives at [facility] but per chart review was planning to move to [place] this coming week. Pt presents to acute OT demonstrating impaired ADL performance and functional mobility 2/2 decreased balance, strength, activity tolerance, and abdominal pain (See OT problem list). Pt currently requires MIN A for ADL transfers, MOD A for LB ADL, and CGA in standing for clothing mgt. Pt took a few steps with RW from recliner to the [facility] with CGA-MIN A and use of RW. Pt instructed in techniques to improve ADL transfers prior to [facility] with noted improvement in transfer from recliner. PT in to assist pt following toileting task for further therapy. Pt would benefit from skilled OT services to address noted impairments and functional limitations (see below for any additional details) in order to maximize safety and independence while minimizing falls risk and caregiver burden. Upon hospital discharge, recommend STR to maximize pt safety and return to PLOF.     Recommendations for follow up therapy are one component of a multi-disciplinary discharge planning process, led by the attending physician.  Recommendations may be updated based on patient status, additional functional criteria and insurance authorization.   Follow Up Recommendations  Skilled nursing-short term rehab (<3 hours/day)    Assistance Recommended at Discharge Frequent or constant Supervision/Assistance   Patient can return home with the following A little help with walking and/or transfers;A lot of help with bathing/dressing/bathroom;Assistance with cooking/housework;Assist for transportation;Help with stairs or ramp for entrance    Functional Status Assessment  Patient has had a recent decline in their functional status and demonstrates the ability to make significant improvements in function in a reasonable and predictable amount of time.  Equipment Recommendations  BSC/3in1;Other (comment) (2WW)    Recommendations for Other Services       Precautions / Restrictions Precautions Precautions: Fall Restrictions Weight Bearing Restrictions: No Other Position/Activity Restrictions: JP drain      Mobility Bed Mobility               General bed mobility comments: NT up in recliner at start, on BSC at end of OT session    Transfers Overall transfer level: Needs assistance Equipment used: Rolling walker (2 wheels) Transfers: Sit to/from Stand, Bed to chair/wheelchair/BSC Sit to Stand: Min assist     Step pivot transfers: Min assist, Min guard     General transfer comment: VC for anterior weight shift prior to lift off with noted improvement in ability to complete      Balance Overall balance assessment: Needs assistance Sitting-balance support: No upper extremity supported, Feet supported Sitting balance-Leahy Scale: Fair     Standing balance support: Bilateral upper extremity supported Standing balance-Leahy Scale: Fair                             ADL either performed or assessed with clinical judgement   ADL  General ADL Comments: Pt requires MOD A for LB ADL, CGA-MIN A for ADL transfers, CGA in standing for clothing mgt.     Vision         Perception     Praxis      Pertinent Vitals/Pain Pain Assessment Pain Assessment: Faces Faces Pain Scale: Hurts little more Pain Location:  abdomen Pain Descriptors / Indicators: Aching, Grimacing, Guarding Pain Intervention(s): Limited activity within patient's tolerance, Monitored during session, Repositioned     Hand Dominance Right   Extremity/Trunk Assessment Upper Extremity Assessment Upper Extremity Assessment: Generalized weakness   Lower Extremity Assessment Lower Extremity Assessment: Generalized weakness   Cervical / Trunk Assessment Cervical / Trunk Assessment: Other exceptions (scoliotic curvature towards L)   Communication Communication Communication: No difficulties   Cognition Arousal/Alertness: Awake/alert Behavior During Therapy: WFL for tasks assessed/performed Overall Cognitive Status: Within Functional Limits for tasks assessed                                 General Comments: encouragement to participate     General Comments       Exercises Other Exercises Other Exercises: Pt educated in ADL transfers with cues for hand placement/RW mgt, falls prevention   Shoulder Instructions      Home Living Family/patient expects to be discharged to:: Assisted living                             Home Equipment: Rollator (4 wheels)   Additional Comments: Resident of [facility]; per daughter, planned transition to [place] in upcoming week      Prior Functioning/Environment Prior Level of Function : Independent/Modified Independent             Mobility Comments: Sup/mod indep with 4WRW for household mobilization and ambulation to/from dining hall.  Denies recent fall history.          OT Problem List: Decreased strength;Pain;Decreased activity tolerance;Impaired balance (sitting and/or standing);Decreased knowledge of use of DME or AE      OT Treatment/Interventions: Self-care/ADL training;Therapeutic exercise;Therapeutic activities;DME and/or AE instruction;Patient/family education;Balance training;Energy conservation    OT Goals(Current goals can be found  in the care plan section) Acute Rehab OT Goals Patient Stated Goal: get better and have less pain OT Goal Formulation: With patient Time For Goal Achievement: 06/25/22 Potential to Achieve Goals: Good ADL Goals Pt Will Perform Lower Body Dressing: with min assist;sit to/from stand Pt Will Transfer to Toilet: with supervision;ambulating (LRAD) Pt Will Perform Toileting - Clothing Manipulation and hygiene: with modified independence Additional ADL Goal #1: Pt will complete all aspects of bathing, primarily from seated, AE PRN, with remote supervision, 2/2 opportunities.  OT Frequency: Min 2X/week    Co-evaluation              AM-PAC OT "6 Clicks" Daily Activity     Outcome Measure Help from another person eating meals?: None Help from another person taking care of personal grooming?: None Help from another person toileting, which includes using toliet, bedpan, or urinal?: A Little Help from another person bathing (including washing, rinsing, drying)?: A Lot Help from another person to put on and taking off regular upper body clothing?: A Little Help from another person to put on and taking off regular lower body clothing?: A Lot 6 Click Score: 18   End of Session Equipment Utilized During Treatment: Rolling walker (2 wheels)  Activity Tolerance: Patient tolerated treatment well Patient left: Other (comment) (seated on BSC with PT for further therapy)  OT Visit Diagnosis: Other abnormalities of gait and mobility (R26.89);Muscle weakness (generalized) (M62.81);Pain Pain - Right/Left: Left (abdomen)                Time: 7915-0569 OT Time Calculation (min): 12 min Charges:  OT General Charges $OT Visit: 1 Visit OT Evaluation $OT Eval Moderate Complexity: 1 Mod  Ardeth Perfect., MPH, MS, OTR/L ascom [phone] 06/11/22, 2:35 PM

## 2022-06-11 NOTE — Progress Notes (Signed)
Physical Therapy Treatment Patient Details Name: Laurie Harris MRN: [id number] DOB: November 13, 1940 Today's Date: 06/11/2022   History of Present Illness presented to ER secondary to abdominal pain; admitted for management of acute perforated diverticulitis, s/p laparoscopic appendectomy (06/08/22) with JP drain placement.    PT Comments    Pt is making gradual progress with ability to ambulate BSC to bed. Of note, pt had BM on [facility] and session handoff from OT. Pt unable to pass stool and it was stuck in rectum. Assisted with hygiene. Once back in bed, agreeable to supine there-ex. Pt still presents with deconditioning and weakness and recs remain for SNF. Will continue to progress.   Recommendations for follow up therapy are one component of a multi-disciplinary discharge planning process, led by the attending physician.  Recommendations may be updated based on patient status, additional functional criteria and insurance authorization.  Follow Up Recommendations  Skilled nursing-short term rehab (<3 hours/day) Can patient physically be transported by private vehicle: Yes   Assistance Recommended at Discharge Frequent or constant Supervision/Assistance  Patient can return home with the following A lot of help with walking and/or transfers;A lot of help with bathing/dressing/bathroom   Equipment Recommendations  Rolling walker (2 wheels);BSC/3in1    Recommendations for Other Services       Precautions / Restrictions Precautions Precautions: Fall Restrictions Weight Bearing Restrictions: No Other Position/Activity Restrictions: JP drain     Mobility  Bed Mobility Overal bed mobility: Needs Assistance Bed Mobility: Sit to Supine       Sit to supine: Min assist   General bed mobility comments: safe technique with assist for B LEs    Transfers Overall transfer level: Needs assistance Equipment used: Rolling walker (2 wheels) Transfers: Sit to/from Stand, Bed to  chair/wheelchair/BSC Sit to Stand: Min assist           General transfer comment: cues for hand placement. Once standing, able to achieve upright posture. RW used    Ambulation/Gait Ambulation/Gait assistance: Herbalist (Feet): 5 Feet Assistive device: Rolling walker (2 wheels) Gait Pattern/deviations: Step-to pattern       General Gait Details: ambulated from [facility] over to bed. Very slow sequencing and cues required for turns. Increased effort   Stairs             Wheelchair Mobility    Modified Rankin (Stroke Patients Only)       Balance Overall balance assessment: Needs assistance Sitting-balance support: No upper extremity supported, Feet supported Sitting balance-Leahy Scale: Fair     Standing balance support: Bilateral upper extremity supported Standing balance-Leahy Scale: Fair                              Cognition Arousal/Alertness: Awake/alert Behavior During Therapy: WFL for tasks assessed/performed Overall Cognitive Status: Within Functional Limits for tasks assessed                                 General Comments: pleasant and agreeable to session        Exercises Other Exercises Other Exercises: supine ther-ex performed on B LE including AP, quad sets, SAQ, and heel slides. 10 reps with supervision    General Comments        Pertinent Vitals/Pain Pain Assessment Pain Assessment: No/denies pain    Home Living Family/patient expects to be discharged to:: Assisted living  Home Equipment: Rollator (4 wheels) Additional Comments: Resident of [facility] [employer] [facility]; per daughter, planned transition to [place] in upcoming week    Prior Function            PT Goals (current goals can now be found in the care plan section) Acute Rehab PT Goals Patient Stated Goal: to get strength back, to clear confusion PT Goal Formulation: With patient/family Time For Goal Achievement:  06/24/22 Potential to Achieve Goals: Fair Progress towards PT goals: Progressing toward goals    Frequency    Min 2X/week      PT Plan Current plan remains appropriate    Co-evaluation              AM-PAC PT "6 Clicks" Mobility   Outcome Measure  Help needed turning from your back to your side while in a flat bed without using bedrails?: A Little Help needed moving from lying on your back to sitting on the side of a flat bed without using bedrails?: A Little Help needed moving to and from a bed to a chair (including a wheelchair)?: A Lot Help needed standing up from a chair using your arms (e.g., wheelchair or bedside chair)?: A Lot Help needed to walk in hospital room?: A Lot Help needed climbing 3-5 steps with a railing? : A Lot 6 Click Score: 14    End of Session Equipment Utilized During Treatment: Gait belt Activity Tolerance: Patient limited by fatigue Patient left: in bed;with bed alarm set Nurse Communication: Mobility status PT Visit Diagnosis: Muscle weakness (generalized) (M62.81);Difficulty in walking, not elsewhere classified (R26.2)     Time: 1422-1440 PT Time Calculation (min) (ACUTE ONLY): 18 min  Charges:  $Therapeutic Exercise: 8-22 mins                     Greggory Stallion, PT, DPT, GCS [phone]    Laurie Harris 06/11/2022, 4:26 PM

## 2022-06-12 DIAGNOSIS — R1084 Generalized abdominal pain: Secondary | ICD-10-CM | POA: Diagnosis not present

## 2022-06-12 DIAGNOSIS — I10 Essential (primary) hypertension: Secondary | ICD-10-CM | POA: Diagnosis not present

## 2022-06-12 DIAGNOSIS — I2510 Atherosclerotic heart disease of native coronary artery without angina pectoris: Secondary | ICD-10-CM | POA: Diagnosis not present

## 2022-06-12 DIAGNOSIS — I129 Hypertensive chronic kidney disease with stage 1 through stage 4 chronic kidney disease, or unspecified chronic kidney disease: Secondary | ICD-10-CM | POA: Diagnosis not present

## 2022-06-12 DIAGNOSIS — Z7401 Bed confinement status: Secondary | ICD-10-CM | POA: Diagnosis not present

## 2022-06-12 DIAGNOSIS — F4321 Adjustment disorder with depressed mood: Secondary | ICD-10-CM | POA: Diagnosis not present

## 2022-06-12 DIAGNOSIS — M6281 Muscle weakness (generalized): Secondary | ICD-10-CM | POA: Diagnosis not present

## 2022-06-12 DIAGNOSIS — M810 Age-related osteoporosis without current pathological fracture: Secondary | ICD-10-CM | POA: Diagnosis not present

## 2022-06-12 DIAGNOSIS — K3580 Unspecified acute appendicitis: Secondary | ICD-10-CM | POA: Diagnosis not present

## 2022-06-12 DIAGNOSIS — R109 Unspecified abdominal pain: Secondary | ICD-10-CM | POA: Diagnosis present

## 2022-06-12 DIAGNOSIS — K36 Other appendicitis: Secondary | ICD-10-CM | POA: Diagnosis not present

## 2022-06-12 DIAGNOSIS — D72829 Elevated white blood cell count, unspecified: Secondary | ICD-10-CM | POA: Diagnosis not present

## 2022-06-12 DIAGNOSIS — N1831 Chronic kidney disease, stage 3a: Secondary | ICD-10-CM | POA: Diagnosis not present

## 2022-06-12 DIAGNOSIS — R41841 Cognitive communication deficit: Secondary | ICD-10-CM | POA: Diagnosis not present

## 2022-06-12 DIAGNOSIS — R29898 Other symptoms and signs involving the musculoskeletal system: Secondary | ICD-10-CM | POA: Diagnosis not present

## 2022-06-12 DIAGNOSIS — I700 Atherosclerosis of aorta: Secondary | ICD-10-CM | POA: Diagnosis not present

## 2022-06-12 DIAGNOSIS — K3532 Acute appendicitis with perforation and localized peritonitis, without abscess: Secondary | ICD-10-CM | POA: Diagnosis not present

## 2022-06-12 DIAGNOSIS — R1312 Dysphagia, oropharyngeal phase: Secondary | ICD-10-CM | POA: Diagnosis not present

## 2022-06-12 DIAGNOSIS — E118 Type 2 diabetes mellitus with unspecified complications: Secondary | ICD-10-CM | POA: Diagnosis not present

## 2022-06-12 DIAGNOSIS — Z9089 Acquired absence of other organs: Secondary | ICD-10-CM | POA: Diagnosis not present

## 2022-06-12 DIAGNOSIS — E785 Hyperlipidemia, unspecified: Secondary | ICD-10-CM | POA: Diagnosis not present

## 2022-06-12 DIAGNOSIS — R140 Abdominal distension (gaseous): Secondary | ICD-10-CM | POA: Diagnosis not present

## 2022-06-12 DIAGNOSIS — R2689 Other abnormalities of gait and mobility: Secondary | ICD-10-CM | POA: Diagnosis not present

## 2022-06-12 DIAGNOSIS — F320 Major depressive disorder, single episode, mild: Secondary | ICD-10-CM | POA: Diagnosis not present

## 2022-06-12 DIAGNOSIS — K567 Ileus, unspecified: Secondary | ICD-10-CM | POA: Diagnosis not present

## 2022-06-12 DIAGNOSIS — N189 Chronic kidney disease, unspecified: Secondary | ICD-10-CM | POA: Diagnosis not present

## 2022-06-12 DIAGNOSIS — R278 Other lack of coordination: Secondary | ICD-10-CM | POA: Diagnosis not present

## 2022-06-12 DIAGNOSIS — E1122 Type 2 diabetes mellitus with diabetic chronic kidney disease: Secondary | ICD-10-CM | POA: Diagnosis not present

## 2022-06-12 DIAGNOSIS — E11319 Type 2 diabetes mellitus with unspecified diabetic retinopathy without macular edema: Secondary | ICD-10-CM | POA: Diagnosis not present

## 2022-06-12 DIAGNOSIS — M6259 Muscle wasting and atrophy, not elsewhere classified, multiple sites: Secondary | ICD-10-CM | POA: Diagnosis not present

## 2022-06-12 LAB — GLUCOSE, CAPILLARY
Glucose-Capillary: 174 mg/dL — ABNORMAL HIGH (ref 70–99)
Glucose-Capillary: 159 mg/dL — ABNORMAL HIGH (ref 70–99)

## 2022-06-12 LAB — BASIC METABOLIC PANEL
Anion gap: 6 (ref 5–15)
BUN: 61 mg/dL — ABNORMAL HIGH (ref 8–23)
CO2: 23 mmol/L (ref 22–32)
Calcium: 8.4 mg/dL — ABNORMAL LOW (ref 8.9–10.3)
Chloride: 114 mmol/L — ABNORMAL HIGH (ref 98–111)
Creatinine, Ser: 1.09 mg/dL — ABNORMAL HIGH (ref 0.44–1.00)
GFR, Estimated: 50 mL/min — ABNORMAL LOW (ref >60)
Glucose, Bld: 158 mg/dL — ABNORMAL HIGH (ref 70–99)
Potassium: 3.9 mmol/L (ref 3.5–5.1)
Sodium: 143 mmol/L (ref 135–145)

## 2022-06-12 LAB — CBC
HCT: 34 % — ABNORMAL LOW (ref 36.0–46.0)
Hemoglobin: 11.5 g/dL — ABNORMAL LOW (ref 12.0–15.0)
MCH: 32.9 pg (ref 26.0–34.0)
MCHC: 33.8 g/dL (ref 30.0–36.0)
MCV: 97.1 fL (ref 80.0–100.0)
Platelets: 196 10*3/uL (ref 150–400)
RBC: 3.5 MIL/uL — ABNORMAL LOW (ref 3.87–5.11)
RDW: 14.5 % (ref 11.5–15.5)
WBC: 7.8 10*3/uL (ref 4.0–10.5)
nRBC: 0 % (ref 0.0–0.2)

## 2022-06-12 LAB — SURGICAL PATHOLOGY

## 2022-06-12 MED ORDER — OXYCODONE HCL 5 MG PO TABS: 5.0000 mg | ORAL_TABLET | Freq: Four times a day (QID) | ORAL | 0 refills | Status: AC | PRN

## 2022-06-12 MED ORDER — OXYCODONE HCL 5 MG PO TABS: 5.0000 mg | ORAL_TABLET | Freq: Four times a day (QID) | ORAL | 0 refills | Status: DC | PRN

## 2022-06-12 MED ORDER — AMOXICILLIN-POT CLAVULANATE 875-125 MG PO TABS: 1.0000 | ORAL_TABLET | Freq: Two times a day (BID) | ORAL | 0 refills | Status: AC

## 2022-06-12 NOTE — TOC Transition Note (Signed)
Transition of Care [facility]) - CM/SW Discharge Note   Patient Details  Name: Laurie Harris MRN: [id number] Date of Birth: 1941-06-02  Transition of Care [facility]) CM/SW Contact:  Beverly Sessions, RN Phone Number: 06/12/2022, 12:09 PM   Clinical Narrative:      Patient will DC to: [facility] room 1206P Anticipated DC date: 06/12/22  Family notified:Daughter Transport NI:[email or web address]   Per MD patient ready for DC to . RN, patient, patient's family, and facility notified of DC. Discharge Summary sent to facility. RN given number for report.  TOC signing off.  Isaias Cowman [facility] [phone]    Barriers to Discharge: Continued Medical Work up   Patient Goals and CMS Choice Patient states their goals for this hospitalization and ongoing recovery are:: SNF CMS Medicare.gov Compare Post Acute Care list provided to:: Patient Represenative (must comment) Choice offered to / list presented to : Adult Children  Discharge Placement                       Discharge Plan and Services                                     Social Determinants of Health (SDOH) Interventions     Readmission Risk Interventions     No data to display

## 2022-06-12 NOTE — Discharge Summary (Signed)
Physician Discharge Summary  Laurie Harris:[id number] DOB: 02-27-1941 DOA: 06/08/2022  PCP: [name], MD  Admit date: 06/08/2022 Discharge date: 06/12/2022  Admitted From: home  Disposition:  SNF  Recommendations for Outpatient Follow-up:  Follow up with PCP in 1-2 weeks F/u w/ general surg, Dr. Hampton Abbot or PA Olean Ree, in 1 week  F/u w/ GI outpatient for liver cirrhosis as incidental finding on CT scan  Home Health: no  Equipment/Devices: JP drain   Discharge Condition: stable  CODE STATUS: DNR  Diet recommendation: Heart Healthy / Carb Modified  HPI was taken from Dr. Tobie Poet: [name] is a 81 year old female with history of hypertension, hyperlipidemia, GERD, who presents ED from PCP office for chief concerns of abdominal pain.   Initial vitals in the emergency department showed temperature of 97.7, respiration rate of 16, heart rate of 113, blood pressure 138/68, SPO2 of 99% on room air.   Serum sodium is 142, potassium 4.6, chloride 109, bicarb 22, BUN of 30, serum creatinine of 1.13, GFR 48, nonfasting blood glucose 196, WBC 10.7, hemoglobin 13.2, platelets of 138.   Lactic acid is 1.4.  AST is 68.  ALT is 52.  UA was negative for leukocytes and nitrates.   CT abdomen pelvis with contrast: Read as acute appendicitis.  No extraluminal air to indicate rupture.  No periappendiceal abscess.  No other acute abnormality within the abdomen or pelvis.  Liver morphologic changes suggesting cirrhosis.  Hepatic steatosis.  Aortic atherosclerosis.   ED treatment: Zosyn -------------------------------------- At bedside patient was able to tell me her name, her age, location, and current calendar year.   She reports that she has been having abdominal pain since approximately 11 PM on 06/07/2022.   She reports her last p.o. intake was a.m. at approximately 8/830 on day of admission.   She denies any known fever, chills, nausea, vomiting, diarrhea, dysuria, hematuria,  syncope.   As per Dr. Jimmye Norman 10/7-10/10/23: Pt presented w/ abd pain and was found to have a perforated appendicitis. Pt is s/p lap appendectomy on 06/08/22 as per general surg. Pt was on IV zosyn while inpatient and will be d/c on po augmentin to complete the course. Pt will be d/c w/ JP drain as per general surg. Pt will f/u w/ gen surg in 1 week. PT/OT evaluated the pt and recommended SNF. For more information, please see previous progress/consult notes.   Discharge Diagnoses:  Principal Problem:   Acute appendicitis Active Problems:   Benign essential HTN   Hyperlipidemia   Carotid artery disease (HCC)   Osteoporosis   Constipation   Depression, major, single episode, mild (HCC)   Diabetic retinopathy ([place])   Stage 3a chronic kidney disease (CKD) ([place])   Type 2 diabetes mellitus without complication, with long-term current use of insulin ([place])   Acute perforated appendicitis   Appendicitis  Acute perforated appendicitis: s/p lap appendectomy on 06/08/22 as per gen surg. Continue on IV zosyn while inpatient and d/c w/ po augmentin as per gen surg. Oxycodone prn for pain. Gen surg following and recs apprec  Likely dementia: continue w/ supportive care. Likely a component of acute hospital delirium but mental status back to baseline    CKDIIIa: Cr is trending down daily.  DM2: well controlled, HbA1c 6.5. Restart home anti-DM meds at d/c    Diabetic retinopathy: continue w/ outpatient f/u w/ ophthalmologist   HLD: continue on statin    HTN: restart ACE-I. Hydralazine prn   Liver cirrhosis: as noted  on CT scan. No hx of alcohol abuse/use. Possibly secondary to NASH. Will need outpatient GI f/u. Pt's daughter is aware and verbalized her understanding   Thrombocytopenia: WNL today   Discharge Instructions  Discharge Instructions     Diet - low sodium heart healthy   Complete by: As directed    Diet Carb Modified   Complete by: As directed    Discharge instructions    Complete by: As directed    F/u w/ PCP in 1-2 weeks. F/u w/ gen surg, Dr. Hampton Abbot or PA Olean Ree, in 1 week. Will d/c w/ JP drain as per general surg   Increase activity slowly   Complete by: As directed    No wound care   Complete by: As directed       Allergies as of 06/12/2022       Reactions   Warfarin And Related    Possible stroke        Medication List     TAKE these medications    acetaminophen 325 MG tablet Commonly known as: TYLENOL Take 650 mg by mouth every 6 (six) hours as needed.   amoxicillin-clavulanate 875-125 MG tablet Commonly known as: AUGMENTIN Take 1 tablet by mouth 2 (two) times daily for 7 days.   aspirin EC 81 MG tablet Take 81 mg by mouth daily.   benazepril 5 MG tablet Commonly known as: LOTENSIN TAKE ONE TABLET EVERY DAY   calcium carbonate 600 MG Tabs tablet Commonly known as: OS-CAL Take one tablet by mouth once a day.   fenofibrate 160 MG tablet Take 1 tablet (160 mg total) by mouth daily.   HumaLOG Mix 75/25 KwikPen (75-25) 100 UNIT/ML Kwikpen Generic drug: Insulin Lispro Prot & Lispro INJECT 14 UNITS SUBCUTANEOUSLY TWICE A DAY WITH A MEAL.   hydrocortisone 2.5 % rectal cream Commonly known as: ANUSOL-HC Place 1 Application rectally 2 (two) times daily.   multivitamin with minerals Tabs tablet Take 1 tablet by mouth daily.   NovoFine Plus 32G X 4 MM Misc Generic drug: Insulin Pen Needle USE TWICE A DAY AS DIRECTED   BD AutoShield Duo 30G X 5 MM Misc Generic drug: Insulin Pen Needle USE TWICE DAILY AS DIRECTED   oxyCODONE 5 MG immediate release tablet Commonly known as: Oxy IR/ROXICODONE Take 1 tablet (5 mg total) by mouth every 6 (six) hours as needed for up to 1 day for moderate pain or severe pain.   pantoprazole 40 MG tablet Commonly known as: PROTONIX TAKE 1 TABLET BY MOUTH EVERY-OTHER-DAY What changed: See the new instructions.   rosuvastatin 40 MG tablet Commonly known as: CRESTOR TAKE (1) TABLET BY MOUTH  ONCE A DAY What changed: See the new instructions.   Vitamin D3 50 MCG (2000 UT) Tabs Take 2,000 Units by mouth daily.        Contact information for follow-up providers     Tylene Fantasia, PA-C Follow up in 1 week(s).   Specialty: Physician Assistant Why: s/p lap appy; has drain Contact information: [address] [place] [address] [phone]              Contact information for after-discharge care     Destination     [facility]erred [facility] .   Service: Skilled Nursing Contact information: [address] [place] [phone]                    Allergies  Allergen Reactions   Warfarin And  Related     Possible stroke    Consultations: General surg    Procedures/Studies: CT ABDOMEN PELVIS W CONTRAST  Result Date: 06/08/2022 CLINICAL DATA:  Right lower quadrant pain. Reported history of appendicitis that resolved without surgery. EXAM: CT ABDOMEN AND PELVIS WITH CONTRAST TECHNIQUE: Multidetector CT imaging of the abdomen and pelvis was performed using the standard protocol following bolus administration of intravenous contrast. RADIATION DOSE REDUCTION: This exam was performed according to the departmental dose-optimization program which includes automated exposure control, adjustment of the mA and/or kV according to patient size and/or use of iterative reconstruction technique. CONTRAST:  86m OMNIPAQUE IOHEXOL 300 MG/ML  SOLN COMPARISON:  12/08/2019 FINDINGS: Lower chest: No acute abnormality. Hepatobiliary: Decreased liver attenuation consistent with fatty infiltration. Central volume loss. Relative enlargement of the lateral segment of the left lobe and caudate lobe. Subtle surface nodularity. No liver mass. Status post cholecystectomy. No bile duct dilation. Pancreas: Unremarkable. No pancreatic ductal dilatation or surrounding inflammatory changes. Spleen: Normal in size without focal  abnormality. Adrenals/Urinary Tract: Normal adrenal glands. Kidneys normal in size, orientation and position with symmetric enhancement and excretion. Subcentimeter low-attenuation lesion, lateral mid to upper pole of the left kidney, consistent with a cyst. No follow-up recommended. No other renal masses or lesions, no stones and no hydronephrosis. Normal ureters. Bladder unremarkable. Stomach/Bowel: Appendix is dilated to 1.2 cm. Small appendical lith. Adjacent inflammatory changes. No extraluminal air. No defined fluid collection to indicate an abscess. Normal stomach. Small bowel and colon are normal in caliber. No wall thickening. No inflammation. Numerous colonic diverticula, mostly on the left. No diverticulitis. Mild to moderate increase in the colonic stool burden. Increased rectal stool. Vascular/Lymphatic: Dense aortic atherosclerosis. No aneurysm. No enlarged lymph nodes. Reproductive: Uterus and bilateral adnexa are unremarkable. Other: Trace amount of free fluid in the right pelvis. Musculoskeletal: No fracture or acute finding. Scoliosis and degenerative changes of the visualized spine. No bone lesion. IMPRESSION: 1. Acute appendicitis. No extraluminal air to indicate rupture. No periappendiceal abscess. 2. No other acute abnormality within the abdomen or pelvis. 3. Liver morphologic changes suggesting cirrhosis. Hepatic steatosis. 4. Aortic atherosclerosis. Electronically Signed   By: DLajean ManesM.D.   On: 06/08/2022 13:32   (Echo, Carotid, EGD, Colonoscopy, ERCP)    Subjective: Pt c/o not being comfortable sitting in the chair    Discharge Exam: Vitals:   06/12/22 0613 06/12/22 0746  BP: (!) 140/76 (!) 156/77  Pulse: 97 96  Resp: 17 16  Temp: (!) 97.4 F (36.3 C) 98 F (36.7 C)  SpO2: 96% 94%   Vitals:   06/11/22 1554 06/11/22 1950 06/12/22 0613 06/12/22 0746  BP: (!) 174/77 (!) 148/58 (!) 140/76 (!) 156/77  Pulse: 90 100 97 96  Resp: '16 17 17 16  '$ Temp: 97.6 F (36.4 C)  (!) 97.5 F (36.4 C) (!) 97.4 F (36.3 C) 98 F (36.7 C)  TempSrc: Oral Oral Oral Oral  SpO2: 95% 96% 96% 94%  Weight:      Height:        General: Pt is alert, awake, not in acute distress Cardiovascular: S1/S2 +, no rubs, no gallops Respiratory: CTA bilaterally, no wheezing, no rhonchi Abdominal: Soft, NT, ND, bowel sounds + Extremities: no edema, no cyanosis    The results of significant diagnostics from this hospitalization (including imaging, microbiology, ancillary and laboratory) are listed below for reference.     Microbiology: No results found for this or any previous visit (from the past 240 hour(s)).  Labs: BNP (last 3 results) No results for input(s): "BNP" in the last 8760 hours. Basic Metabolic Panel: Recent Labs  Lab 06/08/22 1106 06/09/22 0513 06/10/22 0406 06/11/22 0358 06/12/22 0404  NA 142 140 139 141 143  K 4.6 4.2 4.6 4.5 3.9  CL 109 111 111 111 114*  CO2 '22 22 22 '$ 21* 23  GLUCOSE 196* 165* 204* 223* 158*  BUN 30* 33* 68* 80* 61*  CREATININE 1.13* 1.11* 2.38* 1.63* 1.09*  CALCIUM 9.9 9.0 8.6* 8.8* 8.4*  MG  --  1.8  --  2.2  --   PHOS  --   --   --  3.7  --    Liver Function Tests: Recent Labs  Lab 06/08/22 1106  AST 68*  ALT 52*  ALKPHOS 89  BILITOT 1.3*  PROT 7.2  ALBUMIN 3.6   Recent Labs  Lab 06/08/22 1106  LIPASE 33   No results for input(s): "AMMONIA" in the last 168 hours. CBC: Recent Labs  Lab 06/08/22 1106 06/09/22 0513 06/10/22 0406 06/11/22 0358 06/12/22 0404  WBC 10.7* 9.8 9.0 8.1 7.8  HGB 13.2 12.1 11.4* 12.6 11.5*  HCT 40.6 36.4 34.0* 38.3 34.0*  MCV 98.8 99.5 100.6* 99.2 97.1  PLT 138* 138* 146* 158 196   Cardiac Enzymes: No results for input(s): "CKTOTAL", "CKMB", "CKMBINDEX", "TROPONINI" in the last 168 hours. BNP: Invalid input(s): "POCBNP" CBG: Recent Labs  Lab 06/11/22 0834 06/11/22 1125 06/11/22 1642 06/11/22 2103 06/12/22 0749  GLUCAP 201* 202* 195* 287* 174*   D-Dimer No results for  input(s): "DDIMER" in the last 72 hours. Hgb A1c No results for input(s): "HGBA1C" in the last 72 hours. Lipid Profile No results for input(s): "CHOL", "HDL", "LDLCALC", "TRIG", "CHOLHDL", "LDLDIRECT" in the last 72 hours. Thyroid function studies No results for input(s): "TSH", "T4TOTAL", "T3FREE", "THYROIDAB" in the last 72 hours.  Invalid input(s): "FREET3" Anemia work up No results for input(s): "VITAMINB12", "FOLATE", "FERRITIN", "TIBC", "IRON", "RETICCTPCT" in the last 72 hours. Urinalysis    Component Value Date/Time   COLORURINE AMBER (A) 06/08/2022 1224   APPEARANCEUR HAZY (A) 06/08/2022 1224   APPEARANCEUR Clear 12/31/2014 1822   LABSPEC 1.026 06/08/2022 1224   LABSPEC 1.009 12/31/2014 1822   PHURINE 5.0 06/08/2022 1224   GLUCOSEU 50 (A) 06/08/2022 1224   GLUCOSEU >=500 12/31/2014 1822   HGBUR NEGATIVE 06/08/2022 1224   BILIRUBINUR NEGATIVE 06/08/2022 1224   BILIRUBINUR Negative 12/31/2014 1822   KETONESUR NEGATIVE 06/08/2022 1224   PROTEINUR NEGATIVE 06/08/2022 1224   NITRITE NEGATIVE 06/08/2022 1224   LEUKOCYTESUR NEGATIVE 06/08/2022 1224   LEUKOCYTESUR Negative 12/31/2014 1822   Sepsis Labs Recent Labs  Lab 06/09/22 0513 06/10/22 0406 06/11/22 0358 06/12/22 0404  WBC 9.8 9.0 8.1 7.8   Microbiology No results found for this or any previous visit (from the past 240 hour(s)).   Time coordinating discharge: Over 30 minutes  SIGNED:   Wyvonnia Dusky, MD  [facility] 06/12/2022, 11:53 AM Pager   If 7PM-7AM, please contact night-coverage [email or web address]

## 2022-06-12 NOTE — Progress Notes (Signed)
POD # 4 Alert Still feels very weak Had breakfast AVSS Labs ok creat continues to improve  PE NAD debilitated Abd: soft, incisions c/d/I, Serous output from drain  A/P Expected course May dc one clear by hospitalist We will f/u as outpt and manage drain Outpt completion of a/bs

## 2022-06-12 NOTE — Progress Notes (Signed)
Discharge instructions/packet given to the daughter. Patient sent out via wheelchair with belongings

## 2022-06-12 NOTE — Care Management Important Message (Signed)
Important Message  Patient Details  Name: Laurie Harris MRN: [id number] Date of Birth: October 12, 1940   Medicare Important Message Given:  Yes     Dannette Barbara 06/12/2022, 10:39 AM

## 2022-06-12 NOTE — TOC Progression Note (Signed)
Transition of Care [facility]) - Progression Note    Patient Details  Name: Laurie Harris MRN: [id number] Date of Birth: August 23, 1941  Transition of Care [facility] CM/SW Contact  Beverly Sessions, RN Phone Number: 06/12/2022, 10:36 AM  Clinical Narrative:     Sh[facility] Lull [facility] confirms they can offer a bed Awaiting dc order and summary from MD Daughter to transport at discharge   Expected Discharge Plan: [place] Barriers to Discharge: Continued Medical Work up  Expected Discharge Plan and Services Expected Discharge Plan: [place] arrangements for the past 2 months: [place]                                       Social Determinants of Health (SDOH) Interventions    Readmission Risk Interventions     No data to display

## 2022-06-13 DIAGNOSIS — K36 Other appendicitis: Secondary | ICD-10-CM | POA: Diagnosis not present

## 2022-06-13 DIAGNOSIS — M6281 Muscle weakness (generalized): Secondary | ICD-10-CM | POA: Diagnosis not present

## 2022-06-13 DIAGNOSIS — I10 Essential (primary) hypertension: Secondary | ICD-10-CM | POA: Diagnosis not present

## 2022-06-13 DIAGNOSIS — E1122 Type 2 diabetes mellitus with diabetic chronic kidney disease: Secondary | ICD-10-CM | POA: Diagnosis not present

## 2022-06-13 DIAGNOSIS — N1831 Chronic kidney disease, stage 3a: Secondary | ICD-10-CM | POA: Diagnosis not present

## 2022-06-15 DIAGNOSIS — F4321 Adjustment disorder with depressed mood: Secondary | ICD-10-CM | POA: Diagnosis not present

## 2022-06-15 DIAGNOSIS — I2510 Atherosclerotic heart disease of native coronary artery without angina pectoris: Secondary | ICD-10-CM | POA: Diagnosis not present

## 2022-06-15 DIAGNOSIS — M6281 Muscle weakness (generalized): Secondary | ICD-10-CM | POA: Diagnosis not present

## 2022-06-15 DIAGNOSIS — I10 Essential (primary) hypertension: Secondary | ICD-10-CM | POA: Diagnosis not present

## 2022-06-15 DIAGNOSIS — F320 Major depressive disorder, single episode, mild: Secondary | ICD-10-CM | POA: Diagnosis not present

## 2022-06-15 DIAGNOSIS — E1122 Type 2 diabetes mellitus with diabetic chronic kidney disease: Secondary | ICD-10-CM | POA: Diagnosis not present

## 2022-06-15 DIAGNOSIS — E118 Type 2 diabetes mellitus with unspecified complications: Secondary | ICD-10-CM | POA: Diagnosis not present

## 2022-06-15 DIAGNOSIS — I700 Atherosclerosis of aorta: Secondary | ICD-10-CM | POA: Diagnosis not present

## 2022-06-15 DIAGNOSIS — K36 Other appendicitis: Secondary | ICD-10-CM | POA: Diagnosis not present

## 2022-06-15 DIAGNOSIS — E11319 Type 2 diabetes mellitus with unspecified diabetic retinopathy without macular edema: Secondary | ICD-10-CM | POA: Diagnosis not present

## 2022-06-15 DIAGNOSIS — E785 Hyperlipidemia, unspecified: Secondary | ICD-10-CM | POA: Diagnosis not present

## 2022-06-15 DIAGNOSIS — K3532 Acute appendicitis with perforation and localized peritonitis, without abscess: Secondary | ICD-10-CM | POA: Diagnosis not present

## 2022-06-15 DIAGNOSIS — N1831 Chronic kidney disease, stage 3a: Secondary | ICD-10-CM | POA: Diagnosis not present

## 2022-06-15 DIAGNOSIS — R140 Abdominal distension (gaseous): Secondary | ICD-10-CM | POA: Diagnosis not present

## 2022-06-15 DIAGNOSIS — M810 Age-related osteoporosis without current pathological fracture: Secondary | ICD-10-CM | POA: Diagnosis not present

## 2022-06-18 ENCOUNTER — Emergency Department: Payer: Medicare Other

## 2022-06-18 ENCOUNTER — Emergency Department
Admission: EM | Admit: 2022-06-18 | Discharge: 2022-06-19 | Disposition: A | Discharge status: Home / Self Care | Payer: Medicare Other | Attending: Emergency Medicine | Admitting: Emergency Medicine

## 2022-06-18 ENCOUNTER — Other Ambulatory Visit: Payer: Self-pay

## 2022-06-18 DIAGNOSIS — D72829 Elevated white blood cell count, unspecified: Secondary | ICD-10-CM | POA: Insufficient documentation

## 2022-06-18 DIAGNOSIS — M6281 Muscle weakness (generalized): Secondary | ICD-10-CM | POA: Diagnosis not present

## 2022-06-18 DIAGNOSIS — R1084 Generalized abdominal pain: Secondary | ICD-10-CM | POA: Diagnosis not present

## 2022-06-18 DIAGNOSIS — E1122 Type 2 diabetes mellitus with diabetic chronic kidney disease: Secondary | ICD-10-CM | POA: Insufficient documentation

## 2022-06-18 DIAGNOSIS — I10 Essential (primary) hypertension: Secondary | ICD-10-CM | POA: Diagnosis not present

## 2022-06-18 DIAGNOSIS — K36 Other appendicitis: Secondary | ICD-10-CM | POA: Diagnosis not present

## 2022-06-18 DIAGNOSIS — Z9089 Acquired absence of other organs: Secondary | ICD-10-CM | POA: Insufficient documentation

## 2022-06-18 DIAGNOSIS — I129 Hypertensive chronic kidney disease with stage 1 through stage 4 chronic kidney disease, or unspecified chronic kidney disease: Secondary | ICD-10-CM | POA: Insufficient documentation

## 2022-06-18 DIAGNOSIS — N189 Chronic kidney disease, unspecified: Secondary | ICD-10-CM | POA: Diagnosis not present

## 2022-06-18 DIAGNOSIS — Z9049 Acquired absence of other specified parts of digestive tract: Secondary | ICD-10-CM

## 2022-06-18 DIAGNOSIS — R109 Unspecified abdominal pain: Secondary | ICD-10-CM | POA: Diagnosis present

## 2022-06-18 DIAGNOSIS — N1831 Chronic kidney disease, stage 3a: Secondary | ICD-10-CM | POA: Diagnosis not present

## 2022-06-18 LAB — COMPREHENSIVE METABOLIC PANEL
ALT: 17 U/L (ref 0–44)
AST: 26 U/L (ref 15–41)
Albumin: 2.7 g/dL — ABNORMAL LOW (ref 3.5–5.0)
Alkaline Phosphatase: 102 U/L (ref 38–126)
Anion gap: 7 (ref 5–15)
BUN: 38 mg/dL — ABNORMAL HIGH (ref 8–23)
CO2: 25 mmol/L (ref 22–32)
Calcium: 8.8 mg/dL — ABNORMAL LOW (ref 8.9–10.3)
Chloride: 109 mmol/L (ref 98–111)
Creatinine, Ser: 0.85 mg/dL (ref 0.44–1.00)
GFR, Estimated: >60 mL/min (ref >60)
Glucose, Bld: 187 mg/dL — ABNORMAL HIGH (ref 70–99)
Potassium: 4 mmol/L (ref 3.5–5.1)
Sodium: 141 mmol/L (ref 135–145)
Total Bilirubin: 0.6 mg/dL (ref 0.3–1.2)
Total Protein: 6.2 g/dL — ABNORMAL LOW (ref 6.5–8.1)

## 2022-06-18 LAB — CBC WITH DIFFERENTIAL/PLATELET
Abs Immature Granulocytes: 0.08 10*3/uL — ABNORMAL HIGH (ref 0.00–0.07)
Basophils Absolute: 0.1 10*3/uL (ref 0.0–0.1)
Basophils Relative: 1 %
Eosinophils Absolute: 0.2 10*3/uL (ref 0.0–0.5)
Eosinophils Relative: 1 %
HCT: 41.6 % (ref 36.0–46.0)
Hemoglobin: 13.6 g/dL (ref 12.0–15.0)
Immature Granulocytes: 1 %
Lymphocytes Relative: 18 %
Lymphs Abs: 2.2 10*3/uL (ref 0.7–4.0)
MCH: 32.5 pg (ref 26.0–34.0)
MCHC: 32.7 g/dL (ref 30.0–36.0)
MCV: 99.3 fL (ref 80.0–100.0)
Monocytes Absolute: 0.4 10*3/uL (ref 0.1–1.0)
Monocytes Relative: 3 %
Neutro Abs: 9.6 10*3/uL — ABNORMAL HIGH (ref 1.7–7.7)
Neutrophils Relative %: 76 %
Platelets: 336 10*3/uL (ref 150–400)
RBC: 4.19 MIL/uL (ref 3.87–5.11)
RDW: 13.5 % (ref 11.5–15.5)
WBC: 12.5 10*3/uL — ABNORMAL HIGH (ref 4.0–10.5)
nRBC: 0 % (ref 0.0–0.2)

## 2022-06-18 LAB — LIPASE, BLOOD: Lipase: 79 U/L — ABNORMAL HIGH (ref 11–51)

## 2022-06-18 MED ORDER — MORPHINE SULFATE (PF) 4 MG/ML IV SOLN
4.0000 mg | Freq: Once | INTRAVENOUS | Status: AC
  Administered 2022-06-18: 4 mg via INTRAVENOUS
  Filled 2022-06-18: qty 1

## 2022-06-18 MED ORDER — OXYCODONE HCL 5 MG PO TABS: 5.0000 mg | ORAL_TABLET | Freq: Three times a day (TID) | ORAL | 0 refills | Status: DC | PRN

## 2022-06-18 MED ORDER — IOHEXOL 300 MG/ML  SOLN
75.0000 mL | Freq: Once | INTRAMUSCULAR | Status: AC | PRN
  Administered 2022-06-18: 75 mL via INTRAVENOUS

## 2022-06-18 MED ORDER — ONDANSETRON HCL 4 MG/2ML IJ SOLN
4.0000 mg | Freq: Once | INTRAMUSCULAR | Status: AC
  Administered 2022-06-18: 4 mg via INTRAVENOUS
  Filled 2022-06-18: qty 2

## 2022-06-18 MED ORDER — OXYCODONE HCL 5 MG PO TABS
5.0000 mg | ORAL_TABLET | Freq: Once | ORAL | Status: AC
  Administered 2022-06-19: 5 mg via ORAL
  Filled 2022-06-18: qty 1

## 2022-06-18 MED ORDER — LACTATED RINGERS IV BOLUS
1000.0000 mL | Freq: Once | INTRAVENOUS | Status: AC
  Administered 2022-06-18: 1000 mL via INTRAVENOUS

## 2022-06-18 NOTE — ED Triage Notes (Signed)
Pt experiencing pain in the LLQ where she has a drain placed after her appendectomy.

## 2022-06-18 NOTE — ED Triage Notes (Signed)
Patient arrived by [facility] from [facility]. C/o abdominal pain since appendectomy recently at [facility].   EMS vitals:  164/78 b/p 100HR 18RR 98% RA 217CBG

## 2022-06-18 NOTE — ED Provider Triage Note (Signed)
Emergency Medicine Provider Triage Evaluation Note  MANON BANBURY, a 81 y.o. female with a a history of cirrhosis was evaluated in triage.  Pt complains of lower abdominal pain since her appendectomy.  She presents via EMS from [facility].  Patient had an appendectomy performed on 10/6 by Dr. Hampton Abbot.  Review of Systems  Positive: Abd pain Negative: FCS  Physical Exam  There were no vitals taken for this visit. Gen:   Awake, no distress  NAD Resp:  Normal effort CTA MSK:   Moves extremities without difficulty  Other:  Soft, nontender, surgical drain in place. Bulb with serous fluid noted.   Medical Decision Making  Medically screening exam initiated at 5:16 PM.  Appropriate orders placed.  JAMIELYN PETRUCCI was informed that the remainder of the evaluation will be completed by another provider, this initial triage assessment does not replace that evaluation, and the importance of remaining in the ED until their evaluation is complete.  Geriatric patient to the ED via EMS from her facility, for evaluation of abdominal pain.  Patient is 10 days status post appendectomy.   Melvenia Needles, PA-C 06/18/22 1733

## 2022-06-18 NOTE — ED Provider Notes (Signed)
[facility] Provider Note    Event Date/Time   First MD Initiated Contact with Patient 06/18/22 2111     (approximate)   History   Chief Complaint Abdominal Pain   HPI  Laurie Harris is a 81 y.o. female with past medical history of hypertension, hyperlipidemia, diabetes, CKD, and GERD who presents to the ED for abdominal pain.  Patient was admitted to the hospital for acute perforated appendicitis earlier this month, had appendectomy with drain placement performed 10 days ago.  Patient reports that she was feeling well when she left the hospital but has been dealing with increasing pain in her abdomen diffusely over the past couple of days.  Daughter states her appetite has been decreased at her rehab facility and she has had trouble swallowing at times.  Patient denies any nausea or vomiting and states she has been having normal bowel movements.  She denies any difficulty urinating and has not had any dysuria, hematuria, or flank pain.  Patient and daughter are not aware of any fevers.  Patient states that the pain medication she was prescribed partially alleviates her pain.     Physical Exam   Triage Vital Signs: ED Triage Vitals  Enc Vitals Group     BP 06/18/22 1739 (!) 146/71     Pulse Rate 06/18/22 1739 (!) 112     Resp 06/18/22 1739 18     Temp 06/18/22 1739 98.2 F (36.8 C)     Temp Source 06/18/22 1739 Oral     SpO2 06/18/22 1739 100 %     Weight 06/18/22 1740 120 lb (54.4 kg)     Height --      Head Circumference --      Peak Flow --      Pain Score 06/18/22 1740 6     Pain Loc --      Pain Edu? --      Excl. in [place]? --     Most recent vital signs: Vitals:   06/18/22 1739 06/18/22 2044  BP: (!) 146/71 (!) 156/77  Pulse: (!) 112 (!) 117  Resp: 18 18  Temp: 98.2 F (36.8 C) (!) 97.5 F (36.4 C)  SpO2: 100% 95%    Constitutional: Alert and oriented. Eyes: Conjunctivae are normal. Head: Atraumatic. Nose: No  congestion/rhinnorhea. Mouth/Throat: Mucous membranes are dry. Cardiovascular: Normal rate, regular rhythm. Grossly normal heart sounds.  2+ radial pulses bilaterally. Respiratory: Normal respiratory effort.  No retractions. Lungs CTAB. Gastrointestinal: Soft and diffusely tender to palpation with no rebound or guarding. No distention.  JP drain in place to left lower quadrant with small amount of serous drainage. Musculoskeletal: No lower extremity tenderness nor edema.  Neurologic:  Normal speech and language. No gross focal neurologic deficits are appreciated.    ED Results / Procedures / Treatments   Labs (all labs ordered are listed, but only abnormal results are displayed) Labs Reviewed  COMPREHENSIVE METABOLIC PANEL - Abnormal; Notable for the following components:      Result Value   Glucose, Bld 187 (*)    BUN 38 (*)    Calcium 8.8 (*)    Total Protein 6.2 (*)    Albumin 2.7 (*)    All other components within normal limits  CBC WITH DIFFERENTIAL/PLATELET - Abnormal; Notable for the following components:   WBC 12.5 (*)    Neutro Abs 9.6 (*)    Abs Immature Granulocytes 0.08 (*)    All other components within normal  limits  LIPASE, BLOOD - Abnormal; Notable for the following components:   Lipase 79 (*)    All other components within normal limits  URINALYSIS, ROUTINE W REFLEX MICROSCOPIC   RADIOLOGY CT abdomen/pelvis reviewed and interpreted by me with no inflammatory changes, dilated bowel loops, or focal fluid collections.  PROCEDURES:  Critical Care performed: No  Procedures   MEDICATIONS ORDERED IN ED: Medications  oxyCODONE (Oxy IR/ROXICODONE) immediate release tablet 5 mg (has no administration in time range)  lactated ringers bolus 1,000 mL (1,000 mLs Intravenous New Bag/Given 06/18/22 2201)  morphine (PF) 4 MG/ML injection 4 mg (4 mg Intravenous Given 06/18/22 2201)  ondansetron (ZOFRAN) injection 4 mg (4 mg Intravenous Given 06/18/22 2201)  iohexol  (OMNIPAQUE) 300 MG/ML solution 75 mL (75 mLs Intravenous Contrast Given 06/18/22 2214)     IMPRESSION / MDM / ASSESSMENT AND PLAN / ED COURSE  I reviewed the triage vital signs and the nursing notes.                              81 y.o. female with past medical history of hypertension, hyperlipidemia, diabetes, GERD, CKD, and recent admission for perforated appendicitis who presents to the ED complaining of abdominal pain increasing over the past couple of days, now 10 days out from her surgery.  Patient's presentation is most consistent with acute presentation with potential threat to life or bodily function.  Differential diagnosis includes, but is not limited to, bowel obstruction, ileus, peritonitis, perforated bowel, diverticulitis, displaced drain, intra-abdominal abscess, UTI.  Patient uncomfortable but nontoxic-appearing and in no acute distress, vital signs remarkable for tachycardia but otherwise reassuring.  Her abdomen is soft but diffusely tender to palpation, JP drain in place to left lower quadrant with small amount of serous appearing drainage.  Labs thus far remarkable for mild leukocytosis with no significant anemia.  Renal function is reassuring with no AKI or electrolyte abnormality, however she has elevated BUN to creatinine ratio concerning for dehydration.  Lipase is also mildly elevated but low suspicion for pancreatitis as the cause of her symptoms.  We will hydrate with IV fluids and further assess with CT imaging, treat symptomatically with IV morphine and Zofran.  CT imaging is reassuring with no acute findings, does have findings consistent with recent appendicitis but no fluid collections to indicate abscess and drain is appropriately positioned in left lower quadrant.  Patient's pain is improved on reassessment and she is appropriate for discharge home with general surgery follow-up, already has an appointment scheduled for tomorrow.  Daughter at bedside agrees with  plan, was counseled to have her return to the ED for new or worsening symptoms.      FINAL CLINICAL IMPRESSION(S) / ED DIAGNOSES   Final diagnoses:  Generalized abdominal pain  S/P appendectomy     Rx / DC Orders   ED Discharge Orders          Ordered    oxyCODONE (ROXICODONE) 5 MG immediate release tablet  Every 8 hours PRN        06/18/22 2315             Note:  This document was prepared using Dragon voice recognition software and may include unintentional dictation errors.   Blake Divine, MD 06/18/22 2320

## 2022-06-19 ENCOUNTER — Encounter: Payer: Self-pay | Admitting: Physician Assistant

## 2022-06-19 ENCOUNTER — Ambulatory Visit (INDEPENDENT_AMBULATORY_CARE_PROVIDER_SITE_OTHER): Payer: Medicare Other | Admitting: Physician Assistant

## 2022-06-19 VITALS — BP 160/82 | HR 106 | Temp 98.4°F | Wt 115.0 lb

## 2022-06-19 DIAGNOSIS — Z09 Encounter for follow-up examination after completed treatment for conditions other than malignant neoplasm: Secondary | ICD-10-CM

## 2022-06-19 DIAGNOSIS — K3532 Acute appendicitis with perforation and localized peritonitis, without abscess: Secondary | ICD-10-CM | POA: Diagnosis not present

## 2022-06-19 DIAGNOSIS — M810 Age-related osteoporosis without current pathological fracture: Secondary | ICD-10-CM | POA: Diagnosis not present

## 2022-06-19 DIAGNOSIS — I700 Atherosclerosis of aorta: Secondary | ICD-10-CM | POA: Diagnosis not present

## 2022-06-19 DIAGNOSIS — R1084 Generalized abdominal pain: Secondary | ICD-10-CM | POA: Diagnosis not present

## 2022-06-19 DIAGNOSIS — F320 Major depressive disorder, single episode, mild: Secondary | ICD-10-CM | POA: Diagnosis not present

## 2022-06-19 DIAGNOSIS — E11319 Type 2 diabetes mellitus with unspecified diabetic retinopathy without macular edema: Secondary | ICD-10-CM | POA: Diagnosis not present

## 2022-06-19 DIAGNOSIS — E785 Hyperlipidemia, unspecified: Secondary | ICD-10-CM | POA: Diagnosis not present

## 2022-06-19 DIAGNOSIS — N1831 Chronic kidney disease, stage 3a: Secondary | ICD-10-CM | POA: Diagnosis not present

## 2022-06-19 DIAGNOSIS — E118 Type 2 diabetes mellitus with unspecified complications: Secondary | ICD-10-CM | POA: Diagnosis not present

## 2022-06-19 DIAGNOSIS — E1122 Type 2 diabetes mellitus with diabetic chronic kidney disease: Secondary | ICD-10-CM | POA: Diagnosis not present

## 2022-06-19 DIAGNOSIS — I2510 Atherosclerotic heart disease of native coronary artery without angina pectoris: Secondary | ICD-10-CM | POA: Diagnosis not present

## 2022-06-19 DIAGNOSIS — I10 Essential (primary) hypertension: Secondary | ICD-10-CM | POA: Diagnosis not present

## 2022-06-19 MED ORDER — OXYCODONE HCL 5 MG PO TABS: 5.0000 mg | ORAL_TABLET | Freq: Three times a day (TID) | ORAL | 0 refills | Status: DC | PRN

## 2022-06-19 MED ORDER — OLANZAPINE 5 MG PO TBDP: 5.0000 mg | ORAL_TABLET | Freq: Once | ORAL | Status: DC

## 2022-06-19 NOTE — Patient Instructions (Signed)
If you have any concerns or questions, please feel free to call our office.   Appendicitis, Adult  The appendix is a tube in the body that is shaped like a finger. It is attached to the large intestine. Appendicitis means that this tube is swollen (inflamed). If this is not treated, the tube can tear. This can lead to a life-threatening infection. This condition can also cause pus to build up in the appendix. What are the causes? Something blocking the appendix, such as: A ball of poop (stool). Lymph glands that are bigger than normal. Injury to the belly (abdomen). Sometimes the cause is not known. What increases the risk? You are more likely to get this condition if you are 87-69 years old. What are the signs or symptoms? Pain or tenderness that starts around the belly button and: Moves toward the lower right belly. Gets worse with time. Gets worse if you cough. Gets worse if you make a sudden move. Vomiting or feeling like you may vomit (nauseous). Not wanting to eat as much as normal (loss of appetite). A fever. Trouble pooping (constipation). Watery poop (diarrhea). Feel generally sick (malaise). How is this treated? In general, this condition is treated with both antibiotic medicines and surgery to take out the appendix. If only antibiotics are given and the appendix is not taken out, there is a chance the condition could come back. There are two ways to take out the appendix: Open surgery. For this method, the appendix is taken out through a large cut. This cut is called an incision and is made in the lower right belly. This surgery may be used if: You have scars from another surgery. You have a bleeding condition. You are pregnant and will be having your baby soon. You have a condition that makes it hard to do the other type of surgery. Laparoscopic surgery. For this method, the appendix is taken out through small cuts. Often, this surgery: Causes less pain. Causes fewer  problems. Heals faster. If your appendix tears and pus forms: A drain may be put into the sore. The drain will be used to get rid of the pus. You may get an antibiotic through an IV tube. Your appendix may or may not need to be taken out. Follow these instructions at home: If you had surgery: Follow instructions from your doctor on how to: Care for yourself at home. Take care of your cut from surgery. Medicines Take over-the-counter and prescription medicines only as told by your doctor. If you were prescribed an antibiotic medicine, take it as told by your doctor. Do not stop taking it even if you start to feel better. If told, take steps to prevent problems with pooping (constipation). You may need to: Drink enough fluid to keep your pee (urine) pale yellow. Take medicines. You will be told what medicines to take. Eat foods that are high in fiber. These include beans, whole grains, and fresh fruits and vegetables. Limit foods that are high in fat and sugar. These include fried or sweet foods. Ask your doctor if you should avoid driving or using machines while you are taking your medicine. General instructions Follow instructions from your doctor about what you cannot eat or drink. Do not smoke or use any products that contain nicotine or tobacco. If you need help quitting, ask your doctor. Return to your normal activities when your doctor says that it is safe. Keep all follow-up visits. Contact a doctor if: There is pus, blood, or a  lot of fluid coming from your cut or cuts from surgery. You feel like you may vomit, or you vomit. You have a fever. You are very tired. You have muscle pain. Get help right away if: You have pain in your belly, and the pain is getting worse. You are short of breath. You cannot stop vomiting. These symptoms may be an emergency. Get help right away. Call your local emergency services (911 in the [place] Do not wait to see if the symptoms will go  away. Do not drive yourself to the hospital. Summary Appendicitis is swelling of the appendix. The appendix is a tube that is shaped like a finger. It is joined to the large intestine. This condition may be caused by something that blocks the appendix. This can lead to an infection. This condition is most often treated with both antibiotic medicines and taking out the appendix. This information is not intended to replace advice given to you by your health care provider. Make sure you discuss any questions you have with your health care provider. Document Revised: 02/09/2021 Document Reviewed: 02/09/2021 Elsevier Patient Education  [place].

## 2022-06-19 NOTE — Progress Notes (Signed)
Printed and signed prescription provided to patient's daughter.

## 2022-06-19 NOTE — Progress Notes (Signed)
[facility] POST-OP OFFICE VISIT  06/19/2022  HPI: Laurie Harris is a 81 y.o. female 11 days s/p laparoscopic appendectomy with perforation with Dr Hampton Abbot   She did return to the ED yesterday (10/16) secondary to abdominal discomfort over the last few days with trouble swallowing. No nausea or emesis. JP drain noted to have serous output. Lab work was relatively reassuring aside from mild leukocytosis to 12.5K. She did have a CT Abdomen/Pelvis as well, which I have reviewed, and this is reassuring. She ultimately was feeling better and discharged home.   Today, she reports that she is doing okay. Still having left sided abdominal discomfort but thinks it is related to the drain. No fever, chills, nausea, emesis, urinary or bowel changes. Incisions are healing well but with ecchymosis. Surgical drain with serous output. No other complaints.   Vital signs: BP (!) 160/82   Pulse (!) 106   Temp 98.4 F (36.9 C) (Oral)   Wt 115 lb (52.2 kg)   SpO2 100%   BMI 18.56 kg/m    Physical Exam: Constitutional: Well appearing female, NAD Abdomen: Soft, non-tender, non-distended, no rebound/guarding. Surgical drain in LLQ with serous output (Removed).  Skin: Laparoscopic incisions are healing well, no erythema or drainage. These are ecchymotic   Assessment/Plan: This is a 81 y.o. female 11 days s/p laparoscopic appendectomy with perforation with Dr Hampton Abbot    - Drain removed; dressing placed. Reviewed care  - Pain control prn  - Reviewed wound care recommendation  - Reviewed lifting restrictions; 4 weeks total  - Reviewed surgical pathology; Acute appendicitis with foci of necrosis   - I will see her again in 2-3 weeks for recheck; She, and daughter at bedside, understand to call with questions/concerns in the interim   -- Edison Simon, PA-C [facility] 06/19/2022, 3:36 PM M-F: 7am - 4pm

## 2022-06-20 ENCOUNTER — Ambulatory Visit: Payer: Medicare Other | Admitting: Family Medicine

## 2022-06-20 DIAGNOSIS — M6281 Muscle weakness (generalized): Secondary | ICD-10-CM | POA: Diagnosis not present

## 2022-06-20 DIAGNOSIS — I10 Essential (primary) hypertension: Secondary | ICD-10-CM | POA: Diagnosis not present

## 2022-06-20 DIAGNOSIS — K567 Ileus, unspecified: Secondary | ICD-10-CM | POA: Diagnosis not present

## 2022-06-20 DIAGNOSIS — K36 Other appendicitis: Secondary | ICD-10-CM | POA: Diagnosis not present

## 2022-06-20 DIAGNOSIS — N1831 Chronic kidney disease, stage 3a: Secondary | ICD-10-CM | POA: Diagnosis not present

## 2022-06-20 DIAGNOSIS — E1122 Type 2 diabetes mellitus with diabetic chronic kidney disease: Secondary | ICD-10-CM | POA: Diagnosis not present

## 2022-06-22 DIAGNOSIS — E1122 Type 2 diabetes mellitus with diabetic chronic kidney disease: Secondary | ICD-10-CM | POA: Diagnosis not present

## 2022-06-22 DIAGNOSIS — I700 Atherosclerosis of aorta: Secondary | ICD-10-CM | POA: Diagnosis not present

## 2022-06-22 DIAGNOSIS — E11319 Type 2 diabetes mellitus with unspecified diabetic retinopathy without macular edema: Secondary | ICD-10-CM | POA: Diagnosis not present

## 2022-06-22 DIAGNOSIS — N1831 Chronic kidney disease, stage 3a: Secondary | ICD-10-CM | POA: Diagnosis not present

## 2022-06-22 DIAGNOSIS — E118 Type 2 diabetes mellitus with unspecified complications: Secondary | ICD-10-CM | POA: Diagnosis not present

## 2022-06-22 DIAGNOSIS — F320 Major depressive disorder, single episode, mild: Secondary | ICD-10-CM | POA: Diagnosis not present

## 2022-06-22 DIAGNOSIS — F4321 Adjustment disorder with depressed mood: Secondary | ICD-10-CM | POA: Diagnosis not present

## 2022-06-22 DIAGNOSIS — I2510 Atherosclerotic heart disease of native coronary artery without angina pectoris: Secondary | ICD-10-CM | POA: Diagnosis not present

## 2022-06-22 DIAGNOSIS — K3532 Acute appendicitis with perforation and localized peritonitis, without abscess: Secondary | ICD-10-CM | POA: Diagnosis not present

## 2022-06-22 DIAGNOSIS — I10 Essential (primary) hypertension: Secondary | ICD-10-CM | POA: Diagnosis not present

## 2022-06-22 DIAGNOSIS — M810 Age-related osteoporosis without current pathological fracture: Secondary | ICD-10-CM | POA: Diagnosis not present

## 2022-06-22 DIAGNOSIS — E785 Hyperlipidemia, unspecified: Secondary | ICD-10-CM | POA: Diagnosis not present

## 2022-06-25 DIAGNOSIS — K36 Other appendicitis: Secondary | ICD-10-CM | POA: Diagnosis not present

## 2022-06-25 DIAGNOSIS — I10 Essential (primary) hypertension: Secondary | ICD-10-CM | POA: Diagnosis not present

## 2022-06-25 DIAGNOSIS — M6281 Muscle weakness (generalized): Secondary | ICD-10-CM | POA: Diagnosis not present

## 2022-06-25 DIAGNOSIS — N1831 Chronic kidney disease, stage 3a: Secondary | ICD-10-CM | POA: Diagnosis not present

## 2022-06-25 DIAGNOSIS — E1122 Type 2 diabetes mellitus with diabetic chronic kidney disease: Secondary | ICD-10-CM | POA: Diagnosis not present

## 2022-06-25 DIAGNOSIS — K567 Ileus, unspecified: Secondary | ICD-10-CM | POA: Diagnosis not present

## 2022-06-26 ENCOUNTER — Telehealth: Payer: Self-pay | Admitting: Family Medicine

## 2022-06-26 DIAGNOSIS — M810 Age-related osteoporosis without current pathological fracture: Secondary | ICD-10-CM | POA: Diagnosis not present

## 2022-06-26 DIAGNOSIS — E785 Hyperlipidemia, unspecified: Secondary | ICD-10-CM | POA: Diagnosis not present

## 2022-06-26 DIAGNOSIS — N1831 Chronic kidney disease, stage 3a: Secondary | ICD-10-CM | POA: Diagnosis not present

## 2022-06-26 DIAGNOSIS — F320 Major depressive disorder, single episode, mild: Secondary | ICD-10-CM | POA: Diagnosis not present

## 2022-06-26 DIAGNOSIS — E11319 Type 2 diabetes mellitus with unspecified diabetic retinopathy without macular edema: Secondary | ICD-10-CM | POA: Diagnosis not present

## 2022-06-26 DIAGNOSIS — I2510 Atherosclerotic heart disease of native coronary artery without angina pectoris: Secondary | ICD-10-CM | POA: Diagnosis not present

## 2022-06-26 DIAGNOSIS — E1122 Type 2 diabetes mellitus with diabetic chronic kidney disease: Secondary | ICD-10-CM | POA: Diagnosis not present

## 2022-06-26 DIAGNOSIS — K3532 Acute appendicitis with perforation and localized peritonitis, without abscess: Secondary | ICD-10-CM | POA: Diagnosis not present

## 2022-06-26 DIAGNOSIS — E118 Type 2 diabetes mellitus with unspecified complications: Secondary | ICD-10-CM | POA: Diagnosis not present

## 2022-06-26 DIAGNOSIS — I700 Atherosclerosis of aorta: Secondary | ICD-10-CM | POA: Diagnosis not present

## 2022-06-26 DIAGNOSIS — I10 Essential (primary) hypertension: Secondary | ICD-10-CM | POA: Diagnosis not present

## 2022-06-26 NOTE — Telephone Encounter (Signed)
Medication list faxed to [facility] rehab and confirmation given.  Maxwell Martorano,cma

## 2022-06-26 NOTE — Telephone Encounter (Signed)
Pt daughter called wanting to give the provider the information to [facility] health rehabilitation. They need a med list for pt [phone] [phone]

## 2022-06-27 ENCOUNTER — Encounter: Payer: Self-pay | Admitting: Family Medicine

## 2022-06-27 DIAGNOSIS — K567 Ileus, unspecified: Secondary | ICD-10-CM | POA: Diagnosis not present

## 2022-06-27 DIAGNOSIS — E1122 Type 2 diabetes mellitus with diabetic chronic kidney disease: Secondary | ICD-10-CM | POA: Diagnosis not present

## 2022-06-27 DIAGNOSIS — M6281 Muscle weakness (generalized): Secondary | ICD-10-CM | POA: Diagnosis not present

## 2022-06-27 DIAGNOSIS — I10 Essential (primary) hypertension: Secondary | ICD-10-CM | POA: Diagnosis not present

## 2022-06-27 DIAGNOSIS — K36 Other appendicitis: Secondary | ICD-10-CM | POA: Diagnosis not present

## 2022-06-27 DIAGNOSIS — N1831 Chronic kidney disease, stage 3a: Secondary | ICD-10-CM | POA: Diagnosis not present

## 2022-06-29 DIAGNOSIS — M810 Age-related osteoporosis without current pathological fracture: Secondary | ICD-10-CM | POA: Diagnosis not present

## 2022-06-29 DIAGNOSIS — E11319 Type 2 diabetes mellitus with unspecified diabetic retinopathy without macular edema: Secondary | ICD-10-CM | POA: Diagnosis not present

## 2022-06-29 DIAGNOSIS — E1122 Type 2 diabetes mellitus with diabetic chronic kidney disease: Secondary | ICD-10-CM | POA: Diagnosis not present

## 2022-06-29 DIAGNOSIS — I700 Atherosclerosis of aorta: Secondary | ICD-10-CM | POA: Diagnosis not present

## 2022-06-29 DIAGNOSIS — K3532 Acute appendicitis with perforation and localized peritonitis, without abscess: Secondary | ICD-10-CM | POA: Diagnosis not present

## 2022-06-29 DIAGNOSIS — I2510 Atherosclerotic heart disease of native coronary artery without angina pectoris: Secondary | ICD-10-CM | POA: Diagnosis not present

## 2022-06-29 DIAGNOSIS — N1831 Chronic kidney disease, stage 3a: Secondary | ICD-10-CM | POA: Diagnosis not present

## 2022-06-29 DIAGNOSIS — F320 Major depressive disorder, single episode, mild: Secondary | ICD-10-CM | POA: Diagnosis not present

## 2022-06-29 DIAGNOSIS — I10 Essential (primary) hypertension: Secondary | ICD-10-CM | POA: Diagnosis not present

## 2022-06-29 DIAGNOSIS — E118 Type 2 diabetes mellitus with unspecified complications: Secondary | ICD-10-CM | POA: Diagnosis not present

## 2022-06-29 DIAGNOSIS — E785 Hyperlipidemia, unspecified: Secondary | ICD-10-CM | POA: Diagnosis not present

## 2022-06-30 DIAGNOSIS — F4321 Adjustment disorder with depressed mood: Secondary | ICD-10-CM | POA: Diagnosis not present

## 2022-07-03 DIAGNOSIS — E118 Type 2 diabetes mellitus with unspecified complications: Secondary | ICD-10-CM | POA: Diagnosis not present

## 2022-07-03 DIAGNOSIS — I700 Atherosclerosis of aorta: Secondary | ICD-10-CM | POA: Diagnosis not present

## 2022-07-03 DIAGNOSIS — E11319 Type 2 diabetes mellitus with unspecified diabetic retinopathy without macular edema: Secondary | ICD-10-CM | POA: Diagnosis not present

## 2022-07-03 DIAGNOSIS — M810 Age-related osteoporosis without current pathological fracture: Secondary | ICD-10-CM | POA: Diagnosis not present

## 2022-07-03 DIAGNOSIS — K3532 Acute appendicitis with perforation and localized peritonitis, without abscess: Secondary | ICD-10-CM | POA: Diagnosis not present

## 2022-07-03 DIAGNOSIS — N1831 Chronic kidney disease, stage 3a: Secondary | ICD-10-CM | POA: Diagnosis not present

## 2022-07-03 DIAGNOSIS — F320 Major depressive disorder, single episode, mild: Secondary | ICD-10-CM | POA: Diagnosis not present

## 2022-07-03 DIAGNOSIS — I2510 Atherosclerotic heart disease of native coronary artery without angina pectoris: Secondary | ICD-10-CM | POA: Diagnosis not present

## 2022-07-03 DIAGNOSIS — E1122 Type 2 diabetes mellitus with diabetic chronic kidney disease: Secondary | ICD-10-CM | POA: Diagnosis not present

## 2022-07-03 DIAGNOSIS — E785 Hyperlipidemia, unspecified: Secondary | ICD-10-CM | POA: Diagnosis not present

## 2022-07-03 DIAGNOSIS — I10 Essential (primary) hypertension: Secondary | ICD-10-CM | POA: Diagnosis not present

## 2022-07-05 ENCOUNTER — Encounter: Payer: Medicare Other | Admitting: Physician Assistant

## 2022-07-06 DIAGNOSIS — E118 Type 2 diabetes mellitus with unspecified complications: Secondary | ICD-10-CM | POA: Diagnosis not present

## 2022-07-06 DIAGNOSIS — F320 Major depressive disorder, single episode, mild: Secondary | ICD-10-CM | POA: Diagnosis not present

## 2022-07-06 DIAGNOSIS — K3532 Acute appendicitis with perforation and localized peritonitis, without abscess: Secondary | ICD-10-CM | POA: Diagnosis not present

## 2022-07-06 DIAGNOSIS — I700 Atherosclerosis of aorta: Secondary | ICD-10-CM | POA: Diagnosis not present

## 2022-07-06 DIAGNOSIS — N1831 Chronic kidney disease, stage 3a: Secondary | ICD-10-CM | POA: Diagnosis not present

## 2022-07-06 DIAGNOSIS — E11319 Type 2 diabetes mellitus with unspecified diabetic retinopathy without macular edema: Secondary | ICD-10-CM | POA: Diagnosis not present

## 2022-07-06 DIAGNOSIS — I2510 Atherosclerotic heart disease of native coronary artery without angina pectoris: Secondary | ICD-10-CM | POA: Diagnosis not present

## 2022-07-06 DIAGNOSIS — E785 Hyperlipidemia, unspecified: Secondary | ICD-10-CM | POA: Diagnosis not present

## 2022-07-06 DIAGNOSIS — I10 Essential (primary) hypertension: Secondary | ICD-10-CM | POA: Diagnosis not present

## 2022-07-06 DIAGNOSIS — M810 Age-related osteoporosis without current pathological fracture: Secondary | ICD-10-CM | POA: Diagnosis not present

## 2022-07-06 DIAGNOSIS — E1122 Type 2 diabetes mellitus with diabetic chronic kidney disease: Secondary | ICD-10-CM | POA: Diagnosis not present

## 2022-07-09 DIAGNOSIS — K36 Other appendicitis: Secondary | ICD-10-CM | POA: Diagnosis not present

## 2022-07-09 DIAGNOSIS — E1122 Type 2 diabetes mellitus with diabetic chronic kidney disease: Secondary | ICD-10-CM | POA: Diagnosis not present

## 2022-07-09 DIAGNOSIS — I10 Essential (primary) hypertension: Secondary | ICD-10-CM | POA: Diagnosis not present

## 2022-07-09 DIAGNOSIS — N1831 Chronic kidney disease, stage 3a: Secondary | ICD-10-CM | POA: Diagnosis not present

## 2022-07-09 DIAGNOSIS — K567 Ileus, unspecified: Secondary | ICD-10-CM | POA: Diagnosis not present

## 2022-07-09 DIAGNOSIS — M6281 Muscle weakness (generalized): Secondary | ICD-10-CM | POA: Diagnosis not present

## 2022-07-10 ENCOUNTER — Telehealth: Payer: Self-pay | Admitting: Family Medicine

## 2022-07-10 DIAGNOSIS — R1312 Dysphagia, oropharyngeal phase: Secondary | ICD-10-CM | POA: Diagnosis not present

## 2022-07-10 DIAGNOSIS — Z79891 Long term (current) use of opiate analgesic: Secondary | ICD-10-CM | POA: Diagnosis not present

## 2022-07-10 DIAGNOSIS — F329 Major depressive disorder, single episode, unspecified: Secondary | ICD-10-CM | POA: Diagnosis not present

## 2022-07-10 DIAGNOSIS — E785 Hyperlipidemia, unspecified: Secondary | ICD-10-CM | POA: Diagnosis not present

## 2022-07-10 DIAGNOSIS — I2510 Atherosclerotic heart disease of native coronary artery without angina pectoris: Secondary | ICD-10-CM | POA: Diagnosis not present

## 2022-07-10 DIAGNOSIS — K219 Gastro-esophageal reflux disease without esophagitis: Secondary | ICD-10-CM | POA: Diagnosis not present

## 2022-07-10 DIAGNOSIS — Z9181 History of falling: Secondary | ICD-10-CM | POA: Diagnosis not present

## 2022-07-10 DIAGNOSIS — I779 Disorder of arteries and arterioles, unspecified: Secondary | ICD-10-CM | POA: Diagnosis not present

## 2022-07-10 DIAGNOSIS — E11319 Type 2 diabetes mellitus with unspecified diabetic retinopathy without macular edema: Secondary | ICD-10-CM | POA: Diagnosis not present

## 2022-07-10 DIAGNOSIS — I129 Hypertensive chronic kidney disease with stage 1 through stage 4 chronic kidney disease, or unspecified chronic kidney disease: Secondary | ICD-10-CM | POA: Diagnosis not present

## 2022-07-10 DIAGNOSIS — Z794 Long term (current) use of insulin: Secondary | ICD-10-CM | POA: Diagnosis not present

## 2022-07-10 DIAGNOSIS — K5900 Constipation, unspecified: Secondary | ICD-10-CM | POA: Diagnosis not present

## 2022-07-10 DIAGNOSIS — Z48815 Encounter for surgical aftercare following surgery on the digestive system: Secondary | ICD-10-CM | POA: Diagnosis not present

## 2022-07-10 DIAGNOSIS — M6259 Muscle wasting and atrophy, not elsewhere classified, multiple sites: Secondary | ICD-10-CM | POA: Diagnosis not present

## 2022-07-10 DIAGNOSIS — M810 Age-related osteoporosis without current pathological fracture: Secondary | ICD-10-CM | POA: Diagnosis not present

## 2022-07-10 DIAGNOSIS — M6281 Muscle weakness (generalized): Secondary | ICD-10-CM | POA: Diagnosis not present

## 2022-07-10 DIAGNOSIS — N1831 Chronic kidney disease, stage 3a: Secondary | ICD-10-CM | POA: Diagnosis not present

## 2022-07-10 DIAGNOSIS — E1122 Type 2 diabetes mellitus with diabetic chronic kidney disease: Secondary | ICD-10-CM | POA: Diagnosis not present

## 2022-07-10 NOTE — Telephone Encounter (Signed)
Lisa from [facility] called requesting an order for a accu check twice a day with parameter and a diet order for carb control. Lattie Haw stated a diet order is already in place just need to be current

## 2022-07-11 DIAGNOSIS — N1831 Chronic kidney disease, stage 3a: Secondary | ICD-10-CM | POA: Diagnosis not present

## 2022-07-11 DIAGNOSIS — Z48815 Encounter for surgical aftercare following surgery on the digestive system: Secondary | ICD-10-CM | POA: Diagnosis not present

## 2022-07-11 DIAGNOSIS — E1122 Type 2 diabetes mellitus with diabetic chronic kidney disease: Secondary | ICD-10-CM | POA: Diagnosis not present

## 2022-07-11 DIAGNOSIS — I129 Hypertensive chronic kidney disease with stage 1 through stage 4 chronic kidney disease, or unspecified chronic kidney disease: Secondary | ICD-10-CM | POA: Diagnosis not present

## 2022-07-11 DIAGNOSIS — M6259 Muscle wasting and atrophy, not elsewhere classified, multiple sites: Secondary | ICD-10-CM | POA: Diagnosis not present

## 2022-07-11 DIAGNOSIS — M6281 Muscle weakness (generalized): Secondary | ICD-10-CM | POA: Diagnosis not present

## 2022-07-12 ENCOUNTER — Ambulatory Visit (INDEPENDENT_AMBULATORY_CARE_PROVIDER_SITE_OTHER): Payer: Medicare Other | Admitting: Physician Assistant

## 2022-07-12 ENCOUNTER — Encounter: Payer: Self-pay | Admitting: Physician Assistant

## 2022-07-12 VITALS — BP 164/68 | HR 103 | Temp 98.5°F | Wt 119.0 lb

## 2022-07-12 DIAGNOSIS — Z09 Encounter for follow-up examination after completed treatment for conditions other than malignant neoplasm: Secondary | ICD-10-CM

## 2022-07-12 DIAGNOSIS — K3532 Acute appendicitis with perforation and localized peritonitis, without abscess: Secondary | ICD-10-CM

## 2022-07-12 NOTE — Progress Notes (Signed)
[facility] POST-OP OFFICE VISIT  07/12/2022  HPI: Laurie Harris is a 81 y.o. female 1 month s/p laparoscopic appendectomy with perforation with Dr Hampton Abbot    She is doing well No longer with abdominal pain No fever, chills, nausea, emesis Incisions remain well healed No other complaints from surgical perspective   Vital signs: BP (!) 164/68   Pulse (!) 103   Temp 98.5 F (36.9 C) (Oral)   Wt 119 lb (54 kg)   SpO2 98%   BMI 19.21 kg/m    Physical Exam: Constitutional: Well appearing female, NAD Abdomen: Soft, non-tender, non-distended, no rebound/guarding Skin: Laparoscopic incisions are healing well, no erythema or drainage   Assessment/Plan: This is a 81 y.o. female 1 month s/p laparoscopic appendectomy with perforation with Dr Hampton Abbot     - Nothing further from surgical perspective   - She has completed restrictions  - She can follow up on as needed basis; She, and her daughter, understand to call with questions/concerns  -- Edison Simon, PA-C [facility] 07/12/2022, 3:44 PM M-F: 7am - 4pm

## 2022-07-12 NOTE — Patient Instructions (Signed)

## 2022-07-13 ENCOUNTER — Telehealth: Payer: Self-pay | Admitting: Family Medicine

## 2022-07-13 DIAGNOSIS — Z48815 Encounter for surgical aftercare following surgery on the digestive system: Secondary | ICD-10-CM | POA: Diagnosis not present

## 2022-07-13 DIAGNOSIS — M6281 Muscle weakness (generalized): Secondary | ICD-10-CM | POA: Diagnosis not present

## 2022-07-13 DIAGNOSIS — E1122 Type 2 diabetes mellitus with diabetic chronic kidney disease: Secondary | ICD-10-CM | POA: Diagnosis not present

## 2022-07-13 DIAGNOSIS — N1831 Chronic kidney disease, stage 3a: Secondary | ICD-10-CM | POA: Diagnosis not present

## 2022-07-13 DIAGNOSIS — M6259 Muscle wasting and atrophy, not elsewhere classified, multiple sites: Secondary | ICD-10-CM | POA: Diagnosis not present

## 2022-07-13 DIAGNOSIS — I129 Hypertensive chronic kidney disease with stage 1 through stage 4 chronic kidney disease, or unspecified chronic kidney disease: Secondary | ICD-10-CM | POA: Diagnosis not present

## 2022-07-13 NOTE — Telephone Encounter (Signed)
Copied from [place] [id number]. Topic: Medicare AWV >> Jul 13, 2022  2:12 PM Devoria Glassing wrote: Reason for CRM: Left message for patient to schedule Annual Wellness Visit.  Please schedule with Nurse Health Advisor Denisa O'Brien-Blaney, LPN at [facility]. This appt can be telephone or office visit.  Please call [phone] ask for [facility]

## 2022-07-13 NOTE — Telephone Encounter (Signed)
Forms signed.

## 2022-07-16 NOTE — Telephone Encounter (Signed)
Faxed to [facility].  Confirmation given.  Mariyana Fulop,cma

## 2022-07-17 DIAGNOSIS — Z48815 Encounter for surgical aftercare following surgery on the digestive system: Secondary | ICD-10-CM | POA: Diagnosis not present

## 2022-07-17 DIAGNOSIS — I129 Hypertensive chronic kidney disease with stage 1 through stage 4 chronic kidney disease, or unspecified chronic kidney disease: Secondary | ICD-10-CM | POA: Diagnosis not present

## 2022-07-17 DIAGNOSIS — N1831 Chronic kidney disease, stage 3a: Secondary | ICD-10-CM | POA: Diagnosis not present

## 2022-07-17 DIAGNOSIS — E1122 Type 2 diabetes mellitus with diabetic chronic kidney disease: Secondary | ICD-10-CM | POA: Diagnosis not present

## 2022-07-17 DIAGNOSIS — M6281 Muscle weakness (generalized): Secondary | ICD-10-CM | POA: Diagnosis not present

## 2022-07-17 DIAGNOSIS — M6259 Muscle wasting and atrophy, not elsewhere classified, multiple sites: Secondary | ICD-10-CM | POA: Diagnosis not present

## 2022-07-18 DIAGNOSIS — E1122 Type 2 diabetes mellitus with diabetic chronic kidney disease: Secondary | ICD-10-CM | POA: Diagnosis not present

## 2022-07-18 DIAGNOSIS — M6259 Muscle wasting and atrophy, not elsewhere classified, multiple sites: Secondary | ICD-10-CM | POA: Diagnosis not present

## 2022-07-18 DIAGNOSIS — N1831 Chronic kidney disease, stage 3a: Secondary | ICD-10-CM | POA: Diagnosis not present

## 2022-07-18 DIAGNOSIS — I129 Hypertensive chronic kidney disease with stage 1 through stage 4 chronic kidney disease, or unspecified chronic kidney disease: Secondary | ICD-10-CM | POA: Diagnosis not present

## 2022-07-18 DIAGNOSIS — M6281 Muscle weakness (generalized): Secondary | ICD-10-CM | POA: Diagnosis not present

## 2022-07-18 DIAGNOSIS — Z48815 Encounter for surgical aftercare following surgery on the digestive system: Secondary | ICD-10-CM | POA: Diagnosis not present

## 2022-07-20 ENCOUNTER — Ambulatory Visit (INDEPENDENT_AMBULATORY_CARE_PROVIDER_SITE_OTHER): Payer: Medicare Other | Admitting: Family Medicine

## 2022-07-20 ENCOUNTER — Encounter: Payer: Self-pay | Admitting: Family Medicine

## 2022-07-20 VITALS — BP 140/70 | HR 105 | Temp 98.7°F | Ht 66.0 in | Wt 121.2 lb

## 2022-07-20 DIAGNOSIS — Z794 Long term (current) use of insulin: Secondary | ICD-10-CM | POA: Diagnosis not present

## 2022-07-20 DIAGNOSIS — I10 Essential (primary) hypertension: Secondary | ICD-10-CM | POA: Diagnosis not present

## 2022-07-20 DIAGNOSIS — F320 Major depressive disorder, single episode, mild: Secondary | ICD-10-CM | POA: Diagnosis not present

## 2022-07-20 DIAGNOSIS — R413 Other amnesia: Secondary | ICD-10-CM

## 2022-07-20 DIAGNOSIS — Z09 Encounter for follow-up examination after completed treatment for conditions other than malignant neoplasm: Secondary | ICD-10-CM

## 2022-07-20 DIAGNOSIS — I700 Atherosclerosis of aorta: Secondary | ICD-10-CM

## 2022-07-20 DIAGNOSIS — E1129 Type 2 diabetes mellitus with other diabetic kidney complication: Secondary | ICD-10-CM

## 2022-07-20 DIAGNOSIS — I708 Atherosclerosis of other arteries: Secondary | ICD-10-CM | POA: Diagnosis not present

## 2022-07-20 NOTE — Progress Notes (Unsigned)
Patient seen along with medical student Zada Zhong.  I personally evaluated this patient along with the student, and verified all aspects of the history, physical exam, and medical decision making as documented by the student.  I agree with the student's documentation and have made all necessary edits.  Marylon Verno, MD  

## 2022-07-20 NOTE — Progress Notes (Signed)
Tommi Rumps, MD Phone: [phone]  Laurie Harris is a 81 y.o. female who presents today for hospital f/u.  Acute appendicitis f/u: patient had appendectomy on 06/08/22 and then was seen by [facility] on 10/17 and had her drain removed. Patient reports feeling much better after the drain was removed. She was doing her rehab at [facility] initially and now she is at [facility]. Despite having physical therapy, she still feels weaker than before her hospitalization. She currently denies chest pain, shortness of breath, edema, or abdominal pain.   Diabetes: patient reports the nursing staff at [facility] administer her insulin for her. She reports that she does not like it there because the staff are late with her breakfast every morning. She is concerned about their tardiness because she takes her insulin every morning and it can be close to an hour before she is served food. She has not had any episodes of hypoglycemia or hyperglycemia. She denies symptoms such as sweating, palpitations, dizziness, polyuria, polydipsia, or changes in vision.   Memory: patient's daughter reports that patient's memory worsens in the afternoon and evening. Patient has trouble remembering some events of her hospitalization. Patient is unclear if she is still taking oxycodone that was prescribed when she was in the hospital. She is not in pain but is unsure if the nursing staff at [facility] is still giving her the pain medication.   Depression: patient endorses feelings of depression almost daily. She reports no energy and low mood. She denies SI. She does not want to try any pharmacotherapy due to fears of addiction and because she would like to "fight it on her own."  Social History   Tobacco Use  Smoking Status Never  Smokeless Tobacco Never    Current Outpatient Medications on File Prior to Visit  Medication Sig Dispense Refill   acetaminophen (TYLENOL) 325 MG tablet Take 650 mg by mouth  every 6 (six) hours as needed.     aspirin EC 81 MG tablet Take 81 mg by mouth daily.     BD AUTOSHIELD DUO 30G X 5 MM MISC USE TWICE DAILY AS DIRECTED 100 each 1   benazepril (LOTENSIN) 5 MG tablet TAKE ONE TABLET EVERY DAY (Patient taking differently: Take 5 mg by mouth daily.) 90 tablet 2   calcium carbonate (OS-CAL) 600 MG TABS tablet Take one tablet by mouth once a day. 30 tablet 10   Cholecalciferol (VITAMIN D3) 50 MCG (2000 UT) TABS Take 2,000 Units by mouth daily.     fenofibrate 160 MG tablet Take 1 tablet (160 mg total) by mouth daily. 90 tablet 1   HUMALOG MIX 75/25 KWIKPEN (75-25) 100 UNIT/ML KwikPen INJECT 14 UNITS SUBCUTANEOUSLY TWICE A DAY WITH A MEAL. 15 mL 10   hydrocortisone (ANUSOL-HC) 2.5 % rectal cream Place 1 Application rectally 2 (two) times daily. 30 g 1   Multiple Vitamin (MULTIVITAMIN WITH MINERALS) TABS tablet Take 1 tablet by mouth daily.     NOVOFINE PLUS 32G X 4 MM MISC USE TWICE A DAY AS DIRECTED 100 each 6   pantoprazole (PROTONIX) 40 MG tablet TAKE 1 TABLET BY MOUTH EVERY-OTHER-DAY (Patient taking differently: Take 40 mg by mouth every other day.) 15 tablet 10   rosuvastatin (CRESTOR) 40 MG tablet TAKE (1) TABLET BY MOUTH ONCE A DAY (Patient taking differently: Take 40 mg by mouth daily.) 30 tablet 10   No current facility-administered medications on file prior to visit.     ROS see history of present  illness  Objective  Vitals:   07/20/22 1547 07/20/22 1610  BP: 124/82 (!) 140/70  Pulse: (!) 105   Temp: 98.7 F (37.1 C)   SpO2: 99%     BP Readings from Last 3 Encounters:  07/20/22 (!) 140/70  07/12/22 (!) 164/68  06/19/22 (!) 160/82   Wt Readings from Last 3 Encounters:  07/20/22 121 lb 3.2 oz (55 kg)  07/12/22 119 lb (54 kg)  06/19/22 115 lb (52.2 kg)    Physical Exam Constitutional:      Appearance: Normal appearance.  HENT:     Head: Normocephalic and atraumatic.  Cardiovascular:     Rate and Rhythm: Normal rate and regular rhythm.   Pulmonary:     Effort: Pulmonary effort is normal.     Breath sounds: Normal breath sounds.  Abdominal:     General: Bowel sounds are normal. There is no distension.     Palpations: Abdomen is soft.     Tenderness: There is no abdominal tenderness.  Skin:    General: Skin is warm and dry.  Neurological:     Mental Status: She is alert.      Assessment/Plan: Please see individual problem list.  Problem List Items Addressed This Visit     Benign essential HTN (Chronic)    Well-controlled. Continue to take benazepril '5mg'$  daily.       Type II diabetes mellitus with renal manifestations (HCC) (Chronic)    Well-managed. Continue Humalog 75/25 14 units twice daily with meals.       Depression, major, single episode, mild ([place])    Offered patient medication for this, patient refused. Discussed that the depression could be contributing to her memory difficulties, and that there are non-addictive medication options. Also offered patient therapy referral if she is interested. Advised patient to monitor symptoms and let me know if they worsen or if she changes her mind regarding medication for this.       Hospital discharge follow-up    Surgery has signed off on patient. Patient reports no abdominal pain today. Will complete medication reconciliation to ensure patient is no longer taking oxycodone at [facility]. Encouraged patient to continue physical therapy.      Memory change - Primary    Discussed with patient that if oxycodone is still in her medicine regimen that this could contribute to recent worsening memory. Will call [facility] to ensure oxycodone from hospitalization is discontinued. Orders written in paperwork for [facility] to d/c oxycodone if she is still taking this.       Relevant Orders   COMPLETE METABOLIC PANEL WITH GFR   CBC w/Diff   TSH     Return in about 3 months (around 10/20/2022).   Marisa Cyphers, Medical Student [place]

## 2022-07-20 NOTE — Assessment & Plan Note (Signed)
Well-controlled. Continue to take benazepril '5mg'$  daily.

## 2022-07-20 NOTE — Assessment & Plan Note (Addendum)
Surgery has signed off on patient. Patient reports no abdominal pain today. Will complete medication reconciliation to ensure patient is no longer taking oxycodone at [facility]. Encouraged patient to continue physical therapy.

## 2022-07-20 NOTE — Assessment & Plan Note (Addendum)
Well-managed. Continue Humalog 75/25 14 units twice daily with meals.

## 2022-07-20 NOTE — Assessment & Plan Note (Addendum)
Discussed with patient that if oxycodone is still in her medicine regimen that this could contribute to recent worsening memory. Will call [facility] to ensure oxycodone from hospitalization is discontinued. Orders written in paperwork for [facility] to d/c oxycodone if she is still taking this.

## 2022-07-20 NOTE — Assessment & Plan Note (Addendum)
Offered patient medication for this, patient refused. Discussed that the depression could be contributing to her memory difficulties, and that there are non-addictive medication options. Also offered patient therapy referral if she is interested. Advised patient to monitor symptoms and let me know if they worsen or if she changes her mind regarding medication for this.

## 2022-07-21 LAB — COMPLETE METABOLIC PANEL WITH GFR
AG Ratio: 1.2 (calc) (ref 1.0–2.5)
ALT: 17 U/L (ref 6–29)
AST: 38 U/L — ABNORMAL HIGH (ref 10–35)
Albumin: 3.1 g/dL — ABNORMAL LOW (ref 3.6–5.1)
Alkaline phosphatase (APISO): 91 U/L (ref 37–153)
BUN: 21 mg/dL (ref 7–25)
CO2: 20 mmol/L (ref 20–32)
Calcium: 8.6 mg/dL (ref 8.6–10.4)
Chloride: 111 mmol/L — ABNORMAL HIGH (ref 98–110)
Creat: 0.86 mg/dL (ref 0.60–0.95)
Globulin: 2.6 g/dL (calc) (ref 1.9–3.7)
Glucose, Bld: 215 mg/dL — ABNORMAL HIGH (ref 65–99)
Potassium: 3.3 mmol/L — ABNORMAL LOW (ref 3.5–5.3)
Sodium: 142 mmol/L (ref 135–146)
Total Bilirubin: 0.8 mg/dL (ref 0.2–1.2)
Total Protein: 5.7 g/dL — ABNORMAL LOW (ref 6.1–8.1)
eGFR: 67 mL/min/{1.73_m2} (ref >=60)

## 2022-07-21 LAB — CBC WITH DIFFERENTIAL/PLATELET
Absolute Monocytes: 273 cells/uL (ref 200–950)
Basophils Absolute: 29 cells/uL (ref 0–200)
Basophils Relative: 0.5 %
Eosinophils Absolute: 41 cells/uL (ref 15–500)
Eosinophils Relative: 0.7 %
HCT: 35.6 % (ref 35.0–45.0)
Hemoglobin: 11.6 g/dL — ABNORMAL LOW (ref 11.7–15.5)
Lymphs Abs: 992 cells/uL (ref 850–3900)
MCH: 33.1 pg — ABNORMAL HIGH (ref 27.0–33.0)
MCHC: 32.6 g/dL (ref 32.0–36.0)
MCV: 101.7 fL — ABNORMAL HIGH (ref 80.0–100.0)
MPV: 10.5 fL (ref 7.5–12.5)
Monocytes Relative: 4.7 %
Neutro Abs: 4466 cells/uL (ref 1500–7800)
Neutrophils Relative %: 77 %
Platelets: 152 10*3/uL (ref 140–400)
RBC: 3.5 10*6/uL — ABNORMAL LOW (ref 3.80–5.10)
RDW: 15.3 % — ABNORMAL HIGH (ref 11.0–15.0)
Total Lymphocyte: 17.1 %
WBC: 5.8 10*3/uL (ref 3.8–10.8)

## 2022-07-21 LAB — TSH: TSH: 1.73 mIU/L (ref 0.40–4.50)

## 2022-07-22 DIAGNOSIS — Z23 Encounter for immunization: Secondary | ICD-10-CM | POA: Diagnosis not present

## 2022-07-23 DIAGNOSIS — M6281 Muscle weakness (generalized): Secondary | ICD-10-CM | POA: Diagnosis not present

## 2022-07-23 DIAGNOSIS — M6259 Muscle wasting and atrophy, not elsewhere classified, multiple sites: Secondary | ICD-10-CM | POA: Diagnosis not present

## 2022-07-23 DIAGNOSIS — E1122 Type 2 diabetes mellitus with diabetic chronic kidney disease: Secondary | ICD-10-CM | POA: Diagnosis not present

## 2022-07-23 DIAGNOSIS — I129 Hypertensive chronic kidney disease with stage 1 through stage 4 chronic kidney disease, or unspecified chronic kidney disease: Secondary | ICD-10-CM | POA: Diagnosis not present

## 2022-07-23 DIAGNOSIS — Z48815 Encounter for surgical aftercare following surgery on the digestive system: Secondary | ICD-10-CM | POA: Diagnosis not present

## 2022-07-23 DIAGNOSIS — N1831 Chronic kidney disease, stage 3a: Secondary | ICD-10-CM | POA: Diagnosis not present

## 2022-07-24 ENCOUNTER — Telehealth: Payer: Self-pay | Admitting: Family Medicine

## 2022-07-24 NOTE — Telephone Encounter (Signed)
Pt daughter called stating pt is moving from [facility] to [place] and pt daughter need results from TB test and FL2 sent to [facility] Fax [phone]

## 2022-07-24 NOTE — Telephone Encounter (Signed)
Lattie Haw the nurse at [facility] states the Patient has not had Oxycodone since 07/09/22 when she arrived at [facility]. Lattie Haw also states the provider discontinued the Oxycodone on 07/20/22.

## 2022-07-24 NOTE — Telephone Encounter (Signed)
Noted. Please let the patients daughter know this as well.

## 2022-07-24 NOTE — Telephone Encounter (Signed)
Can you call [facility] and see when the last time this patient received oxycodone was?  I recently discontinued it on her paperwork during her visit last week though I would like to see if she had been getting it leading up to her visit.  Thanks.

## 2022-07-24 NOTE — Telephone Encounter (Signed)
Laurie Harris called stating they have not received the fax on 11/13 and they request for it to be refaxed

## 2022-07-24 NOTE — Telephone Encounter (Signed)
FL2 and TB results sent to [facility]. Confirmation given. ng

## 2022-07-25 ENCOUNTER — Other Ambulatory Visit: Payer: Self-pay

## 2022-07-25 ENCOUNTER — Ambulatory Visit: Payer: Medicare Other | Attending: Nurse Practitioner

## 2022-07-25 ENCOUNTER — Other Ambulatory Visit (INDEPENDENT_AMBULATORY_CARE_PROVIDER_SITE_OTHER): Payer: Medicare Other

## 2022-07-25 DIAGNOSIS — E538 Deficiency of other specified B group vitamins: Secondary | ICD-10-CM

## 2022-07-25 DIAGNOSIS — I6523 Occlusion and stenosis of bilateral carotid arteries: Secondary | ICD-10-CM | POA: Insufficient documentation

## 2022-07-25 DIAGNOSIS — R748 Abnormal levels of other serum enzymes: Secondary | ICD-10-CM | POA: Diagnosis not present

## 2022-07-25 LAB — HEPATIC FUNCTION PANEL
ALT: 21 U/L (ref 0–35)
AST: 37 U/L (ref 0–37)
Albumin: 3.2 g/dL — ABNORMAL LOW (ref 3.5–5.2)
Alkaline Phosphatase: 98 U/L (ref 39–117)
Bilirubin, Direct: 0.2 mg/dL (ref 0.0–0.3)
Total Bilirubin: 0.5 mg/dL (ref 0.2–1.2)
Total Protein: 5.7 g/dL — ABNORMAL LOW (ref 6.0–8.3)

## 2022-07-25 LAB — VITAMIN B12: Vitamin B-12: 368 pg/mL (ref 211–911)

## 2022-07-25 NOTE — Telephone Encounter (Signed)
Patient's daughter let me know the Oxycodone has been discontinued.

## 2022-07-25 NOTE — Addendum Note (Signed)
Addended by: Jeralyn Bennett A on: 07/25/2022 12:47 PM   Modules accepted: Orders

## 2022-07-30 DIAGNOSIS — N1831 Chronic kidney disease, stage 3a: Secondary | ICD-10-CM | POA: Diagnosis not present

## 2022-07-30 DIAGNOSIS — E113293 Type 2 diabetes mellitus with mild nonproliferative diabetic retinopathy without macular edema, bilateral: Secondary | ICD-10-CM | POA: Diagnosis not present

## 2022-07-30 DIAGNOSIS — Z48815 Encounter for surgical aftercare following surgery on the digestive system: Secondary | ICD-10-CM | POA: Diagnosis not present

## 2022-07-30 DIAGNOSIS — I129 Hypertensive chronic kidney disease with stage 1 through stage 4 chronic kidney disease, or unspecified chronic kidney disease: Secondary | ICD-10-CM | POA: Diagnosis not present

## 2022-07-30 DIAGNOSIS — E1122 Type 2 diabetes mellitus with diabetic chronic kidney disease: Secondary | ICD-10-CM | POA: Diagnosis not present

## 2022-07-30 DIAGNOSIS — M6259 Muscle wasting and atrophy, not elsewhere classified, multiple sites: Secondary | ICD-10-CM | POA: Diagnosis not present

## 2022-07-30 DIAGNOSIS — M6281 Muscle weakness (generalized): Secondary | ICD-10-CM | POA: Diagnosis not present

## 2022-07-30 LAB — HM DIABETES EYE EXAM

## 2022-07-31 DIAGNOSIS — N1831 Chronic kidney disease, stage 3a: Secondary | ICD-10-CM | POA: Diagnosis not present

## 2022-07-31 DIAGNOSIS — Z48815 Encounter for surgical aftercare following surgery on the digestive system: Secondary | ICD-10-CM | POA: Diagnosis not present

## 2022-07-31 DIAGNOSIS — E1122 Type 2 diabetes mellitus with diabetic chronic kidney disease: Secondary | ICD-10-CM | POA: Diagnosis not present

## 2022-07-31 DIAGNOSIS — M6281 Muscle weakness (generalized): Secondary | ICD-10-CM | POA: Diagnosis not present

## 2022-07-31 DIAGNOSIS — M6259 Muscle wasting and atrophy, not elsewhere classified, multiple sites: Secondary | ICD-10-CM | POA: Diagnosis not present

## 2022-07-31 DIAGNOSIS — I129 Hypertensive chronic kidney disease with stage 1 through stage 4 chronic kidney disease, or unspecified chronic kidney disease: Secondary | ICD-10-CM | POA: Diagnosis not present

## 2022-07-31 NOTE — Progress Notes (Signed)
Stable moderate stenosis. Recommend repeating study in 1 year.

## 2022-08-02 DIAGNOSIS — N1831 Chronic kidney disease, stage 3a: Secondary | ICD-10-CM | POA: Diagnosis not present

## 2022-08-02 DIAGNOSIS — I10 Essential (primary) hypertension: Secondary | ICD-10-CM | POA: Diagnosis not present

## 2022-08-03 ENCOUNTER — Telehealth: Payer: Self-pay

## 2022-08-03 ENCOUNTER — Telehealth: Payer: Self-pay | Admitting: Family Medicine

## 2022-08-03 NOTE — Telephone Encounter (Signed)
Attempted to call [facility] to see exactly what they need per Dr. Caryl Bis

## 2022-08-03 NOTE — Telephone Encounter (Signed)
Pt daughter called stating they need an updated FL2 form so the pt can move in today

## 2022-08-03 NOTE — Telephone Encounter (Signed)
Patient has moved back to [facility], and they need a FL2. Daughter states that all Dr Caryl Bis needs to do is take the other FL2 and amend the date to current date. The other one is out of date. Please fax to [phone].

## 2022-08-06 NOTE — Telephone Encounter (Signed)
Spoke to [employer] a new FL2 has to be done

## 2022-08-07 NOTE — Telephone Encounter (Signed)
FL2 form completed and faxed to [facility]

## 2022-08-08 ENCOUNTER — Ambulatory Visit (INDEPENDENT_AMBULATORY_CARE_PROVIDER_SITE_OTHER): Payer: Medicare Other

## 2022-08-08 ENCOUNTER — Encounter: Payer: Self-pay | Admitting: Family Medicine

## 2022-08-08 VITALS — Ht 66.0 in | Wt 121.0 lb

## 2022-08-08 DIAGNOSIS — Z0000 Encounter for general adult medical examination without abnormal findings: Secondary | ICD-10-CM

## 2022-08-08 NOTE — Progress Notes (Signed)
Subjective:   Laurie Harris is a 81 y.o. female who presents for Medicare Annual (Subsequent) preventive examination.  Review of Systems    No ROS.  Medicare Wellness Virtual Visit.  Visual/audio telehealth visit, UTA vital signs.   See social history for additional risk factors.   Cardiac Risk Factors include: advanced age (>75mn, >>70women);hypertension;diabetes mellitus     Objective:    Today's Vitals   08/08/22 1120  Weight: 121 lb (54.9 kg)  Height: '5\' 6"'$  (1.676 m)   Body mass index is 19.53 kg/m.     08/08/2022   11:53 AM 06/18/2022    5:38 PM 06/08/2022   11:00 PM 06/08/2022   11:05 AM 07/13/2021    3:52 PM 07/12/2020    9:51 AM 12/08/2019   10:15 PM  Advanced Directives  Does Patient Have a Medical Advance Directive? Yes Yes Yes No Yes Yes No  Type of [place] Power of [place] Living will   Does patient want to make changes to medical advance directive? No - Patient declined  No - Patient declined  No - Patient declined No - Patient declined   Copy of HAudrainin Chart? No - copy requested  No - copy requested  No - copy requested    Would patient like information on creating a medical advance directive?  No - Patient declined No - Patient declined No - Patient declined   No - Patient declined    Current Medications (verified) Outpatient Encounter Medications as of 08/08/2022  Medication Sig   acetaminophen (TYLENOL) 325 MG tablet Take 650 mg by mouth every 6 (six) hours as needed.   aspirin EC 81 MG tablet Take 81 mg by mouth daily.   BD AUTOSHIELD DUO 30G X 5 MM MISC USE TWICE DAILY AS DIRECTED   benazepril (LOTENSIN) 5 MG tablet TAKE ONE TABLET EVERY DAY (Patient taking differently: Take 5 mg by mouth daily.)   calcium carbonate (OS-CAL) 600 MG TABS tablet Take one tablet by mouth once a day.    Cholecalciferol (VITAMIN D3) 50 MCG (2000 UT) TABS Take 2,000 Units by mouth daily.   fenofibrate 160 MG tablet Take 1 tablet (160 mg total) by mouth daily.   HUMALOG MIX 75/25 KWIKPEN (75-25) 100 UNIT/ML KwikPen INJECT 14 UNITS SUBCUTANEOUSLY TWICE A DAY WITH A MEAL.   hydrocortisone (ANUSOL-HC) 2.5 % rectal cream Place 1 Application rectally 2 (two) times daily.   Multiple Vitamin (MULTIVITAMIN WITH MINERALS) TABS tablet Take 1 tablet by mouth daily.   NOVOFINE PLUS 32G X 4 MM MISC USE TWICE A DAY AS DIRECTED   pantoprazole (PROTONIX) 40 MG tablet TAKE 1 TABLET BY MOUTH EVERY-OTHER-DAY (Patient taking differently: Take 40 mg by mouth every other day.)   rosuvastatin (CRESTOR) 40 MG tablet TAKE (1) TABLET BY MOUTH ONCE A DAY (Patient taking differently: Take 40 mg by mouth daily.)   No facility-administered encounter medications on file as of 08/08/2022.   Allergies (verified) Warfarin and related and Warfarin   History: Past Medical History:  Diagnosis Date   Car[place] arterial disease ([place]    a. 09/2016 Carotid U/S: R carotid bifurcation dzs of 50-69%, L carotid bifurcation dzs of <50%. Patent vertebral arteries w/ anegrade flow.   Cerebellar hemorrhage ([place] 03/16/2015   Left Cerebellar Hemorrhage- [facility]     Chicken pox  CKD (chronic kidney disease), stage III (HCC)    functioning at 46%   Diabetes mellitus without complication ([place])    Hemorrhagic stroke ([place]) 12/31/2014   a. in the setting of warfarin therapy.   History of appendicitis 08/07/2016   Hyperlipidemia    Hypertension    Syncope    a. 09/2016 Echo: EF 55-60%, no rwma, Gr1 DD.   Past Surgical History:  Procedure Laterality Date   ABCESS DRAINAGE  06/14/2016   appendix   APPENDECTOMY  06/13/2016   CATARACT EXTRACTION W/PHACO Left 09/04/2017   Procedure: CATARACT EXTRACTION PHACO AND INTRAOCULAR LENS PLACEMENT ([place]) COMPLICATED LEFT DIABETIC;  Surgeon: Leandrew Koyanagi, MD;   Location: [place];  Service: Ophthalmology;  Laterality: Left;  Diabetic - insulin   CATARACT EXTRACTION W/PHACO Right 10/02/2017   Procedure: CATARACT EXTRACTION PHACO AND INTRAOCULAR LENS PLACEMENT ([place]) COMPLICATED  RIGHT DIABETIC;  Surgeon: Leandrew Koyanagi, MD;  Location: [place];  Service: Ophthalmology;  Laterality: Right;  Diabetic - insulin   CHOLECYSTECTOMY N/A 06/17/2018   Procedure: LAPAROSCOPIC CHOLECYSTECTOMY;  Surgeon: Benjamine Sprague, DO;  Location: [facility];  Service: General;  Laterality: N/A;   COLONOSCOPY     LAPAROSCOPIC APPENDECTOMY N/A 06/08/2022   Procedure: APPENDECTOMY LAPAROSCOPIC;  Surgeon: Olean Ree, MD;  Location: [facility];  Service: General;  Laterality: N/A;   NECK SURGERY     Family History  Problem Relation Age of Onset   Diabetes Mother    Dementia Mother    Heart disease Mother    Cancer Mother        lung   Cancer Father        colon   Stroke Daughter    Hyperlipidemia Daughter    Breast cancer Neg Hx    Social History   Socioeconomic History   Marital status: Widowed    Spouse name: Not on file   Number of children: Not on file   Years of education: Not on file   Highest education level: Not on file  Occupational History   Not on file  Tobacco Use   Smoking status: Never   Smokeless tobacco: Never  Vaping Use   Vaping Use: Never used  Substance and Sexual Activity   Alcohol use: No    Alcohol/week: 0.0 standard drinks of alcohol   Drug use: Never   Sexual activity: Not Currently  Other Topics Concern   Not on file  Social History Narrative   Not on file   Social Determinants of Health   Financial Resource Strain: Low Risk  (08/08/2022)   Overall Financial Resource Strain (CARDIA)    Difficulty of Paying Living Expenses: Not hard at all  Food Insecurity: No Food Insecurity (08/08/2022)   Hunger Vital Sign    Worried About Running Out of Food in the Last Year: Never true    [place] in the Last  Year: Never true  Transportation Needs: No Transportation Needs (08/08/2022)   PRAPARE - Hydrologist (Medical): No    Lack of Transportation (Non-Medical): No  Physical Activity: Insufficiently Active (08/08/2022)   Exercise Vital Sign    Days of Exercise per Week: 6 days    Minutes of Exercise per Session: 20 min  Stress: No Stress Concern Present (08/08/2022)   [place]    Feeling of Stress : Not at all  Social Connections: Moderately Integrated (08/08/2022)   Social Connection and Isolation Panel [NHANES]  Frequency of Communication with Friends and Family: More than three times a week    Frequency of Social Gatherings with Friends and Family: More than three times a week    Attends Religious Services: 1 to 4 times per year    Active Member of [employer] or Organizations: Yes    Attends Archivist Meetings: Never    Marital Status: Widowed   Tobacco Counseling Counseling given: Not Answered   Clinical Intake:  Pre-visit preparation completed: Yes        Diabetes: Yes (Followed by pcp)  Nutrition Risk Assessment: Does the patient have any non-healing wounds?  No  Has the patient had any unintentional weight loss or weight gain?  No   Financial Strains and Diabetes Management: Are you having any financial strains with the device, your supplies or your medication? No .  Does the patient want to be seen by Chronic Care Management for management of their diabetes?  No  Would the patient like to be referred to a Nutritionist or for Diabetic Management?  No   How often do you need to have someone help you when you read instructions, pamphlets, or other written materials from your doctor or pharmacy?: 3 - Sometimes   Interpreter Needed?: No    Activities of Daily Living    08/08/2022   11:26 AM 06/08/2022   11:00 PM  In your present state of health, do you have any  difficulty performing the following activities:  Hearing? 0 0  Vision? 0 0  Difficulty concentrating or making decisions? 0 0  Walking or climbing stairs? 1 0  Comment rollator walker in use   Dressing or bathing? 1 1  Comment staff assist   Doing errands, shopping? 1 0  Comment staff assist   Preparing Food and eating ? N   Using the Toilet? N   In the past six months, have you accidently leaked urine? N   Do you have problems with loss of bowel control? N   Managing your Medications? Y   Managing your Finances? Y   Comment daughter assist   Housekeeping or managing your Housekeeping? Y   Comment staff assist     Patient Care Team: [name], MD as PCP - General (Family Medicine) Wellington Hampshire, MD as PCP - Cardiology (Cardiology) Dionisio David, MD (Cardiology)  Indicate any recent Medical Services you may have received from other than [facility] providers in the past year (date may be approximate).     Assessment:   This is a routine wellness examination for Laurie Harris.  I connected with  Malissa Hippo on 08/08/22 by a audio enabled telemedicine application and verified that I am speaking with the correct person using two identifiers.  Patient Location: Home  Provider Location: Office/Clinic  I discussed the limitations of evaluation and management by telemedicine. The patient expressed understanding and agreed to proceed.   Hearing/Vision screen Hearing Screening - Comments:: Patient is able to hear conversational tones without difficulty.  No issues reported.   Vision Screening - Comments:: Wears corrective lenses Cataract extraction, bilateral    Dietary issues and exercise activities discussed: Current Exercise Habits: Home exercise routine, Type of exercise: walking, Intensity: Mild Healthy diet Good water intake   Goals Addressed               This Visit's Progress     Patient Stated     Increase physical activity (pt-stated)  Continue home physical therapy exercises (standing, sitting, lying) daily        Depression Screen    08/08/2022   11:55 AM 07/20/2022    3:50 PM 06/08/2022   10:01 AM 04/25/2022    1:43 PM 11/03/2021   11:09 AM 07/13/2021    3:43 PM 06/20/2021    1:37 PM  PHQ 2/9 Scores  PHQ - 2 Score 5 5 0 0 0 0 0  PHQ- 9 Score   0        Fall Risk    08/08/2022   11:26 AM 07/20/2022    3:49 PM 04/25/2022    1:43 PM 12/19/2021    9:41 AM 11/03/2021   11:09 AM  Fall Risk   Falls in the past year? 1 1 0 1 0  Number falls in past yr: 0 0 0 0 0  Injury with Fall? 0 0 0 0 0  Risk for fall due to :  History of fall(s) No Fall Risks No Fall Risks No Fall Risks  Follow up Falls evaluation completed;Falls prevention discussed;Education provided Falls evaluation completed Falls evaluation completed Falls evaluation completed Falls evaluation completed    FALL RISK PREVENTION PERTAINING TO THE HOME: Home free of loose throw rugs in walkways, pet beds, electrical cords, etc? Yes  Adequate lighting in your home to reduce risk of falls? Yes   ASSISTIVE DEVICES UTILIZED TO PREVENT FALLS: Life alert? Yes  Use of a cane, walker or w/c? Yes  Grab bars in the bathroom? Yes   TIMED UP AND GO: Was the test performed? No .   Cognitive Function:    06/09/2021   10:02 AM 07/01/2017   11:53 AM  MMSE - Mini Mental State Exam  Orientation to time 5 5  Orientation to Place 4 5  Registration 3 3  Attention/ Calculation 5 5  Recall 2 3  Language- name 2 objects 2 2  Language- repeat 1 1  Language- follow 3 step command 3 3  Language- read & follow direction 1 1  Write a sentence 1 1  Copy design 1 1  Total score 28 30      08/25/2021   11:38 AM  Montreal Cognitive Assessment   Visuospatial/ Executive (0/5) 3  Naming (0/3) 2  Attention: Read list of digits (0/2) 2  Attention: Read list of letters (0/1) 0  Attention: Serial 7 subtraction starting at 100 (0/3) 0  Language: Repeat phrase (0/2) 2   Language : Fluency (0/1) 0  Abstraction (0/2) 0  Delayed Recall (0/5) 5  Orientation (0/6) 5  Total 19  Adjusted Score (based on education) 20      08/08/2022   12:16 PM 07/13/2021    4:13 PM 07/12/2020    9:50 AM 07/10/2019   10:46 AM 07/02/2018   11:29 AM  6CIT Screen  What Year? 0 points 0 points 0 points 0 points 0 points  What month? 0 points 0 points 0 points 0 points 0 points  What time? 0 points 0 points 0 points 0 points 0 points  Count back from 20 0 points 0 points  0 points 0 points  Months in reverse 0 points 0 points 0 points 0 points 0 points  Repeat phrase 0 points 2 points 2 points  0 points  Total Score 0 points 2 points   0 points    Immunizations Immunization History  Administered Date(s) Administered   PFIZER(Purple Top)SARS-COV-2 Vaccination 09/28/2019, 10/19/2019, 06/22/2020, 02/22/2021   PPD  Test 10/14/2019   Tdap 08/23/2019   Pneumococcal vaccine status: Due, Education has been provided regarding the importance of this vaccine. Advised may receive this vaccine at local pharmacy or Health Dept. Aware to provide a copy of the vaccination record if obtained from local pharmacy or Health Dept. Verbalized acceptance and understanding.  Covid-19 vaccine status: Completed vaccines x4.  Shingrix Completed?: No.    Education has been provided regarding the importance of this vaccine. Patient has been advised to call insurance company to determine out of pocket expense if they have not yet received this vaccine. Advised may also receive vaccine at local pharmacy or Health Dept. Verbalized acceptance and understanding.  Screening Tests Health Maintenance  Topic Date Due   Diabetic kidney evaluation - Urine ACR  09/13/2019   COVID-19 Vaccine (5 - 2023-24 season) 08/24/2022 (Originally 05/04/2022)   Pneumonia Vaccine 48+ Years old (1 - PCV) 10/09/2022 (Originally 10/14/1946)   Zoster Vaccines- Shingrix (1 of 2) 11/07/2022 (Originally 10/14/1959)   INFLUENZA VACCINE   12/02/2022 (Originally 04/03/2022)   COLONOSCOPY (Pts 45-88yr Insurance coverage will need to be confirmed)  08/09/2023 (Originally 07/20/2019)   HEMOGLOBIN A1C  10/26/2022   FOOT EXAM  12/20/2022   Diabetic kidney evaluation - GFR measurement  07/21/2023   OPHTHALMOLOGY EXAM  07/31/2023   Medicare Annual Wellness (AWV)  08/09/2023   DTaP/Tdap/Td (2 - Td or Tdap) 08/22/2029   DEXA SCAN  Completed   HPV VACCINES  Aged Out   Health Maintenance Health Maintenance Due  Topic Date Due   Diabetic kidney evaluation - Urine ACR  09/13/2019   Colonoscopy- declined.  Lung Cancer Screening: (Low Dose CT Chest recommended if Age 83-80years, 30 pack-year currently smoking OR have quit w/in 15years.) does not qualify.   Vision Screening: Recommended annual ophthalmology exams for early detection of glaucoma and other disorders of the eye.  Dental Screening: Recommended annual dental exams for proper oral hygiene.  Community Resource Referral / Chronic Care Management: CRR required this visit?  No   CCM required this visit?  No      Plan:     I have personally reviewed and noted the following in the patient's chart:   Medical and social history Use of alcohol, tobacco or illicit drugs  Current medications and supplements including opioid prescriptions. Patient is not currently taking opioid prescriptions. Functional ability and status Nutritional status Physical activity Advanced directives List of other physicians Hospitalizations, surgeries, and ER visits in previous 12 months Vitals Screenings to include cognitive, depression, and falls Referrals and appointments  In addition, I have reviewed and discussed with patient certain preventive protocols, quality metrics, and best practice recommendations. A written personalized care plan for preventive services as well as general preventive health recommendations were provided to patient.     DLeta Jungling LPN   [id number]

## 2022-08-08 NOTE — Patient Instructions (Addendum)
Laurie Harris , Thank you for taking time to come for your Medicare Wellness Visit. I appreciate your ongoing commitment to your health goals. Please review the following plan we discussed and let me know if I can assist you in the future.   These are the goals we discussed:  Goals       Patient Stated     Increase physical activity (pt-stated)      Continue home physical therapy exercises (standing, sitting, lying) daily       Other     Follow up with Primary Care Provider      As needed        This is a list of the screening recommended for you and due dates:  Health Maintenance  Topic Date Due   Yearly kidney health urinalysis for diabetes  09/13/2019   COVID-19 Vaccine (5 - 2023-24 season) 08/24/2022*   Pneumonia Vaccine (1 - PCV) 10/09/2022*   Zoster (Shingles) Vaccine (1 of 2) 11/07/2022*   Flu Shot  12/02/2022*   Colon Cancer Screening  08/09/2023*   Hemoglobin A1C  10/26/2022   Complete foot exam   12/20/2022   Yearly kidney function blood test for diabetes  07/21/2023   Eye exam for diabetics  07/31/2023   Medicare Annual Wellness Visit  08/09/2023   DTaP/Tdap/Td vaccine (2 - Td or Tdap) 08/22/2029   DEXA scan (bone density measurement)  Completed   HPV Vaccine  Aged Out  *Topic was postponed. The date shown is not the original due date.    Advanced directives: End of life planning; Advance aging; Advanced directives discussed.  Copy of current HCPOA/Living Will requested.    Conditions/risks identified: none new  Next appointment: Follow up in one year for your annual wellness visit    Preventive Care 65 Years and Older, Female Preventive care refers to lifestyle choices and visits with your health care provider that can promote health and wellness. What does preventive care include? A yearly physical exam. This is also called an annual well check. Dental exams once or twice a year. Routine eye exams. Ask your health care provider how often you should have  your eyes checked. Personal lifestyle choices, including: Daily care of your teeth and gums. Regular physical activity. Eating a healthy diet. Avoiding tobacco and drug use. Limiting alcohol use. Practicing safe sex. Taking low-dose aspirin every day. Taking vitamin and mineral supplements as recommended by your health care provider. What happens during an annual well check? The services and screenings done by your health care provider during your annual well check will depend on your age, overall health, lifestyle risk factors, and family history of disease. Counseling  Your health care provider may ask you questions about your: Alcohol use. Tobacco use. Drug use. Emotional well-being. Home and relationship well-being. Sexual activity. Eating habits. History of falls. Memory and ability to understand (cognition). Work and work Statistician. Reproductive health. Screening  You may have the following tests or measurements: Height, weight, and BMI. Blood pressure. Lipid and cholesterol levels. These may be checked every 5 years, or more frequently if you are over 63 years old. Skin check. Lung cancer screening. You may have this screening every year starting at age 16 if you have a 30-pack-year history of smoking and currently smoke or have quit within the past 15 years. Fecal occult blood test (FOBT) of the stool. You may have this test every year starting at age 10. Flexible sigmoidoscopy or colonoscopy. You may have a sigmoidoscopy  every 5 years or a colonoscopy every 10 years starting at age 25. Hepatitis C blood test. Hepatitis B blood test. Sexually transmitted disease (STD) testing. Diabetes screening. This is done by checking your blood sugar (glucose) after you have not eaten for a while (fasting). You may have this done every 1-3 years. Bone density scan. This is done to screen for osteoporosis. You may have this done starting at age 23. Mammogram. This may be done every  1-2 years. Talk to your health care provider about how often you should have regular mammograms. Talk with your health care provider about your test results, treatment options, and if necessary, the need for more tests. Vaccines  Your health care provider may recommend certain vaccines, such as: Influenza vaccine. This is recommended every year. Tetanus, diphtheria, and acellular pertussis (Tdap, Td) vaccine. You may need a Td booster every 10 years. Zoster vaccine. You may need this after age 58. Pneumococcal 13-valent conjugate (PCV13) vaccine. One dose is recommended after age 45. Pneumococcal polysaccharide (PPSV23) vaccine. One dose is recommended after age 93. Talk to your health care provider about which screenings and vaccines you need and how often you need them. This information is not intended to replace advice given to you by your health care provider. Make sure you discuss any questions you have with your health care provider. Document Released: 09/16/2015 Document Revised: 05/09/2016 Document Reviewed: 06/21/2015 Elsevier Interactive Patient Education  2017 [employer] Prevention in the Home Falls can cause injuries. They can happen to people of all ages. There are many things you can do to make your home safe and to help prevent falls. What can I do on the outside of my home? Regularly fix the edges of walkways and driveways and fix any cracks. Remove anything that might make you trip as you walk through a door, such as a raised step or threshold. Trim any bushes or trees on the path to your home. Use bright outdoor lighting. Clear any walking paths of anything that might make someone trip, such as rocks or tools. Regularly check to see if handrails are loose or broken. Make sure that both sides of any steps have handrails. Any raised decks and porches should have guardrails on the edges. Have any leaves, snow, or ice cleared regularly. Use sand or salt on walking  paths during winter. Clean up any spills in your garage right away. This includes oil or grease spills. What can I do in the bathroom? Use night lights. Install grab bars by the toilet and in the tub and shower. Do not use towel bars as grab bars. Use non-skid mats or decals in the tub or shower. If you need to sit down in the shower, use a plastic, non-slip stool. Keep the floor dry. Clean up any water that spills on the floor as soon as it happens. Remove soap buildup in the tub or shower regularly. Attach bath mats securely with double-sided non-slip rug tape. Do not have throw rugs and other things on the floor that can make you trip. What can I do in the bedroom? Use night lights. Make sure that you have a light by your bed that is easy to reach. Do not use any sheets or blankets that are too big for your bed. They should not hang down onto the floor. Have a firm chair that has side arms. You can use this for support while you get dressed. Do not have throw rugs and other things  on the floor that can make you trip. What can I do in the kitchen? Clean up any spills right away. Avoid walking on wet floors. Keep items that you use a lot in easy-to-reach places. If you need to reach something above you, use a strong step stool that has a grab bar. Keep electrical cords out of the way. Do not use floor polish or wax that makes floors slippery. If you must use wax, use non-skid floor wax. Do not have throw rugs and other things on the floor that can make you trip. What can I do with my stairs? Do not leave any items on the stairs. Make sure that there are handrails on both sides of the stairs and use them. Fix handrails that are broken or loose. Make sure that handrails are as long as the stairways. Check any carpeting to make sure that it is firmly attached to the stairs. Fix any carpet that is loose or worn. Avoid having throw rugs at the top or bottom of the stairs. If you do have  throw rugs, attach them to the floor with carpet tape. Make sure that you have a light switch at the top of the stairs and the bottom of the stairs. If you do not have them, ask someone to add them for you. What else can I do to help prevent falls? Wear shoes that: Do not have high heels. Have rubber bottoms. Are comfortable and fit you well. Are closed at the toe. Do not wear sandals. If you use a stepladder: Make sure that it is fully opened. Do not climb a closed stepladder. Make sure that both sides of the stepladder are locked into place. Ask someone to hold it for you, if possible. Clearly mark and make sure that you can see: Any grab bars or handrails. First and last steps. Where the edge of each step is. Use tools that help you move around (mobility aids) if they are needed. These include: Canes. Walkers. Scooters. Crutches. Turn on the lights when you go into a dark area. Replace any light bulbs as soon as they burn out. Set up your furniture so you have a clear path. Avoid moving your furniture around. If any of your floors are uneven, fix them. If there are any pets around you, be aware of where they are. Review your medicines with your doctor. Some medicines can make you feel dizzy. This can increase your chance of falling. Ask your doctor what other things that you can do to help prevent falls. This information is not intended to replace advice given to you by your health care provider. Make sure you discuss any questions you have with your health care provider. Document Released: 06/16/2009 Document Revised: 01/26/2016 Document Reviewed: 09/24/2014 Elsevier Interactive Patient Education  2017 [employer].

## 2022-08-09 DIAGNOSIS — Z48815 Encounter for surgical aftercare following surgery on the digestive system: Secondary | ICD-10-CM | POA: Diagnosis not present

## 2022-08-09 DIAGNOSIS — K5900 Constipation, unspecified: Secondary | ICD-10-CM | POA: Diagnosis not present

## 2022-08-09 DIAGNOSIS — I779 Disorder of arteries and arterioles, unspecified: Secondary | ICD-10-CM | POA: Diagnosis not present

## 2022-08-09 DIAGNOSIS — F329 Major depressive disorder, single episode, unspecified: Secondary | ICD-10-CM | POA: Diagnosis not present

## 2022-08-09 DIAGNOSIS — E11319 Type 2 diabetes mellitus with unspecified diabetic retinopathy without macular edema: Secondary | ICD-10-CM | POA: Diagnosis not present

## 2022-08-09 DIAGNOSIS — I2510 Atherosclerotic heart disease of native coronary artery without angina pectoris: Secondary | ICD-10-CM | POA: Diagnosis not present

## 2022-08-09 DIAGNOSIS — Z9181 History of falling: Secondary | ICD-10-CM | POA: Diagnosis not present

## 2022-08-09 DIAGNOSIS — Z79891 Long term (current) use of opiate analgesic: Secondary | ICD-10-CM | POA: Diagnosis not present

## 2022-08-09 DIAGNOSIS — E1122 Type 2 diabetes mellitus with diabetic chronic kidney disease: Secondary | ICD-10-CM | POA: Diagnosis not present

## 2022-08-09 DIAGNOSIS — E785 Hyperlipidemia, unspecified: Secondary | ICD-10-CM | POA: Diagnosis not present

## 2022-08-09 DIAGNOSIS — Z794 Long term (current) use of insulin: Secondary | ICD-10-CM | POA: Diagnosis not present

## 2022-08-09 DIAGNOSIS — R1312 Dysphagia, oropharyngeal phase: Secondary | ICD-10-CM | POA: Diagnosis not present

## 2022-08-09 DIAGNOSIS — M6259 Muscle wasting and atrophy, not elsewhere classified, multiple sites: Secondary | ICD-10-CM | POA: Diagnosis not present

## 2022-08-09 DIAGNOSIS — I129 Hypertensive chronic kidney disease with stage 1 through stage 4 chronic kidney disease, or unspecified chronic kidney disease: Secondary | ICD-10-CM | POA: Diagnosis not present

## 2022-08-09 DIAGNOSIS — N1831 Chronic kidney disease, stage 3a: Secondary | ICD-10-CM | POA: Diagnosis not present

## 2022-08-09 DIAGNOSIS — K219 Gastro-esophageal reflux disease without esophagitis: Secondary | ICD-10-CM | POA: Diagnosis not present

## 2022-08-09 DIAGNOSIS — M810 Age-related osteoporosis without current pathological fracture: Secondary | ICD-10-CM | POA: Diagnosis not present

## 2022-08-09 DIAGNOSIS — M6281 Muscle weakness (generalized): Secondary | ICD-10-CM | POA: Diagnosis not present

## 2022-08-13 DIAGNOSIS — N1831 Chronic kidney disease, stage 3a: Secondary | ICD-10-CM | POA: Diagnosis not present

## 2022-08-13 DIAGNOSIS — M6281 Muscle weakness (generalized): Secondary | ICD-10-CM | POA: Diagnosis not present

## 2022-08-13 DIAGNOSIS — I129 Hypertensive chronic kidney disease with stage 1 through stage 4 chronic kidney disease, or unspecified chronic kidney disease: Secondary | ICD-10-CM | POA: Diagnosis not present

## 2022-08-13 DIAGNOSIS — Z48815 Encounter for surgical aftercare following surgery on the digestive system: Secondary | ICD-10-CM | POA: Diagnosis not present

## 2022-08-13 DIAGNOSIS — M6259 Muscle wasting and atrophy, not elsewhere classified, multiple sites: Secondary | ICD-10-CM | POA: Diagnosis not present

## 2022-08-13 DIAGNOSIS — E1122 Type 2 diabetes mellitus with diabetic chronic kidney disease: Secondary | ICD-10-CM | POA: Diagnosis not present

## 2022-08-15 DIAGNOSIS — E1122 Type 2 diabetes mellitus with diabetic chronic kidney disease: Secondary | ICD-10-CM | POA: Diagnosis not present

## 2022-08-15 DIAGNOSIS — M6259 Muscle wasting and atrophy, not elsewhere classified, multiple sites: Secondary | ICD-10-CM | POA: Diagnosis not present

## 2022-08-15 DIAGNOSIS — M6281 Muscle weakness (generalized): Secondary | ICD-10-CM | POA: Diagnosis not present

## 2022-08-15 DIAGNOSIS — N1831 Chronic kidney disease, stage 3a: Secondary | ICD-10-CM | POA: Diagnosis not present

## 2022-08-15 DIAGNOSIS — I129 Hypertensive chronic kidney disease with stage 1 through stage 4 chronic kidney disease, or unspecified chronic kidney disease: Secondary | ICD-10-CM | POA: Diagnosis not present

## 2022-08-15 DIAGNOSIS — Z48815 Encounter for surgical aftercare following surgery on the digestive system: Secondary | ICD-10-CM | POA: Diagnosis not present

## 2022-08-20 DIAGNOSIS — Z48815 Encounter for surgical aftercare following surgery on the digestive system: Secondary | ICD-10-CM | POA: Diagnosis not present

## 2022-08-20 DIAGNOSIS — N1831 Chronic kidney disease, stage 3a: Secondary | ICD-10-CM | POA: Diagnosis not present

## 2022-08-20 DIAGNOSIS — M6259 Muscle wasting and atrophy, not elsewhere classified, multiple sites: Secondary | ICD-10-CM | POA: Diagnosis not present

## 2022-08-20 DIAGNOSIS — M6281 Muscle weakness (generalized): Secondary | ICD-10-CM | POA: Diagnosis not present

## 2022-08-20 DIAGNOSIS — E1122 Type 2 diabetes mellitus with diabetic chronic kidney disease: Secondary | ICD-10-CM | POA: Diagnosis not present

## 2022-08-20 DIAGNOSIS — I129 Hypertensive chronic kidney disease with stage 1 through stage 4 chronic kidney disease, or unspecified chronic kidney disease: Secondary | ICD-10-CM | POA: Diagnosis not present

## 2022-08-21 DIAGNOSIS — M6281 Muscle weakness (generalized): Secondary | ICD-10-CM | POA: Diagnosis not present

## 2022-08-21 DIAGNOSIS — I129 Hypertensive chronic kidney disease with stage 1 through stage 4 chronic kidney disease, or unspecified chronic kidney disease: Secondary | ICD-10-CM | POA: Diagnosis not present

## 2022-08-21 DIAGNOSIS — M6259 Muscle wasting and atrophy, not elsewhere classified, multiple sites: Secondary | ICD-10-CM | POA: Diagnosis not present

## 2022-08-21 DIAGNOSIS — Z48815 Encounter for surgical aftercare following surgery on the digestive system: Secondary | ICD-10-CM | POA: Diagnosis not present

## 2022-08-21 DIAGNOSIS — E1122 Type 2 diabetes mellitus with diabetic chronic kidney disease: Secondary | ICD-10-CM | POA: Diagnosis not present

## 2022-08-21 DIAGNOSIS — N1831 Chronic kidney disease, stage 3a: Secondary | ICD-10-CM | POA: Diagnosis not present

## 2022-08-23 DIAGNOSIS — I10 Essential (primary) hypertension: Secondary | ICD-10-CM | POA: Diagnosis not present

## 2022-08-23 DIAGNOSIS — N1831 Chronic kidney disease, stage 3a: Secondary | ICD-10-CM | POA: Diagnosis not present

## 2022-08-30 DIAGNOSIS — M6281 Muscle weakness (generalized): Secondary | ICD-10-CM | POA: Diagnosis not present

## 2022-08-30 DIAGNOSIS — N1831 Chronic kidney disease, stage 3a: Secondary | ICD-10-CM | POA: Diagnosis not present

## 2022-08-30 DIAGNOSIS — I129 Hypertensive chronic kidney disease with stage 1 through stage 4 chronic kidney disease, or unspecified chronic kidney disease: Secondary | ICD-10-CM | POA: Diagnosis not present

## 2022-08-30 DIAGNOSIS — Z48815 Encounter for surgical aftercare following surgery on the digestive system: Secondary | ICD-10-CM | POA: Diagnosis not present

## 2022-08-30 DIAGNOSIS — M6259 Muscle wasting and atrophy, not elsewhere classified, multiple sites: Secondary | ICD-10-CM | POA: Diagnosis not present

## 2022-08-30 DIAGNOSIS — E1122 Type 2 diabetes mellitus with diabetic chronic kidney disease: Secondary | ICD-10-CM | POA: Diagnosis not present

## 2022-08-31 DIAGNOSIS — L600 Ingrowing nail: Secondary | ICD-10-CM | POA: Diagnosis not present

## 2022-08-31 DIAGNOSIS — E1140 Type 2 diabetes mellitus with diabetic neuropathy, unspecified: Secondary | ICD-10-CM | POA: Diagnosis not present

## 2022-08-31 DIAGNOSIS — M79671 Pain in right foot: Secondary | ICD-10-CM | POA: Diagnosis not present

## 2022-08-31 DIAGNOSIS — B351 Tinea unguium: Secondary | ICD-10-CM | POA: Diagnosis not present

## 2022-08-31 DIAGNOSIS — M79672 Pain in left foot: Secondary | ICD-10-CM | POA: Diagnosis not present

## 2022-09-06 DIAGNOSIS — M6281 Muscle weakness (generalized): Secondary | ICD-10-CM | POA: Diagnosis not present

## 2022-09-06 DIAGNOSIS — E1122 Type 2 diabetes mellitus with diabetic chronic kidney disease: Secondary | ICD-10-CM | POA: Diagnosis not present

## 2022-09-06 DIAGNOSIS — Z48815 Encounter for surgical aftercare following surgery on the digestive system: Secondary | ICD-10-CM | POA: Diagnosis not present

## 2022-09-06 DIAGNOSIS — I129 Hypertensive chronic kidney disease with stage 1 through stage 4 chronic kidney disease, or unspecified chronic kidney disease: Secondary | ICD-10-CM | POA: Diagnosis not present

## 2022-09-06 DIAGNOSIS — N1831 Chronic kidney disease, stage 3a: Secondary | ICD-10-CM | POA: Diagnosis not present

## 2022-09-06 DIAGNOSIS — M6259 Muscle wasting and atrophy, not elsewhere classified, multiple sites: Secondary | ICD-10-CM | POA: Diagnosis not present

## 2022-09-07 DIAGNOSIS — N1831 Chronic kidney disease, stage 3a: Secondary | ICD-10-CM | POA: Diagnosis not present

## 2022-09-07 DIAGNOSIS — M6281 Muscle weakness (generalized): Secondary | ICD-10-CM | POA: Diagnosis not present

## 2022-09-07 DIAGNOSIS — M6259 Muscle wasting and atrophy, not elsewhere classified, multiple sites: Secondary | ICD-10-CM | POA: Diagnosis not present

## 2022-09-07 DIAGNOSIS — I129 Hypertensive chronic kidney disease with stage 1 through stage 4 chronic kidney disease, or unspecified chronic kidney disease: Secondary | ICD-10-CM | POA: Diagnosis not present

## 2022-09-07 DIAGNOSIS — E1122 Type 2 diabetes mellitus with diabetic chronic kidney disease: Secondary | ICD-10-CM | POA: Diagnosis not present

## 2022-09-07 DIAGNOSIS — Z48815 Encounter for surgical aftercare following surgery on the digestive system: Secondary | ICD-10-CM | POA: Diagnosis not present

## 2022-09-08 DIAGNOSIS — I2510 Atherosclerotic heart disease of native coronary artery without angina pectoris: Secondary | ICD-10-CM | POA: Diagnosis not present

## 2022-09-08 DIAGNOSIS — N1831 Chronic kidney disease, stage 3a: Secondary | ICD-10-CM | POA: Diagnosis not present

## 2022-09-08 DIAGNOSIS — F329 Major depressive disorder, single episode, unspecified: Secondary | ICD-10-CM | POA: Diagnosis not present

## 2022-09-08 DIAGNOSIS — E11319 Type 2 diabetes mellitus with unspecified diabetic retinopathy without macular edema: Secondary | ICD-10-CM | POA: Diagnosis not present

## 2022-09-08 DIAGNOSIS — Z794 Long term (current) use of insulin: Secondary | ICD-10-CM | POA: Diagnosis not present

## 2022-09-08 DIAGNOSIS — E785 Hyperlipidemia, unspecified: Secondary | ICD-10-CM | POA: Diagnosis not present

## 2022-09-08 DIAGNOSIS — M6259 Muscle wasting and atrophy, not elsewhere classified, multiple sites: Secondary | ICD-10-CM | POA: Diagnosis not present

## 2022-09-08 DIAGNOSIS — K219 Gastro-esophageal reflux disease without esophagitis: Secondary | ICD-10-CM | POA: Diagnosis not present

## 2022-09-08 DIAGNOSIS — I779 Disorder of arteries and arterioles, unspecified: Secondary | ICD-10-CM | POA: Diagnosis not present

## 2022-09-08 DIAGNOSIS — R1312 Dysphagia, oropharyngeal phase: Secondary | ICD-10-CM | POA: Diagnosis not present

## 2022-09-08 DIAGNOSIS — I129 Hypertensive chronic kidney disease with stage 1 through stage 4 chronic kidney disease, or unspecified chronic kidney disease: Secondary | ICD-10-CM | POA: Diagnosis not present

## 2022-09-08 DIAGNOSIS — K5900 Constipation, unspecified: Secondary | ICD-10-CM | POA: Diagnosis not present

## 2022-09-08 DIAGNOSIS — M810 Age-related osteoporosis without current pathological fracture: Secondary | ICD-10-CM | POA: Diagnosis not present

## 2022-09-08 DIAGNOSIS — E1122 Type 2 diabetes mellitus with diabetic chronic kidney disease: Secondary | ICD-10-CM | POA: Diagnosis not present

## 2022-09-08 DIAGNOSIS — M6281 Muscle weakness (generalized): Secondary | ICD-10-CM | POA: Diagnosis not present

## 2022-09-08 DIAGNOSIS — Z79891 Long term (current) use of opiate analgesic: Secondary | ICD-10-CM | POA: Diagnosis not present

## 2022-09-08 DIAGNOSIS — Z9181 History of falling: Secondary | ICD-10-CM | POA: Diagnosis not present

## 2022-09-10 DIAGNOSIS — M6281 Muscle weakness (generalized): Secondary | ICD-10-CM | POA: Diagnosis not present

## 2022-09-10 DIAGNOSIS — E11319 Type 2 diabetes mellitus with unspecified diabetic retinopathy without macular edema: Secondary | ICD-10-CM | POA: Diagnosis not present

## 2022-09-10 DIAGNOSIS — I129 Hypertensive chronic kidney disease with stage 1 through stage 4 chronic kidney disease, or unspecified chronic kidney disease: Secondary | ICD-10-CM | POA: Diagnosis not present

## 2022-09-10 DIAGNOSIS — E1122 Type 2 diabetes mellitus with diabetic chronic kidney disease: Secondary | ICD-10-CM | POA: Diagnosis not present

## 2022-09-10 DIAGNOSIS — M6259 Muscle wasting and atrophy, not elsewhere classified, multiple sites: Secondary | ICD-10-CM | POA: Diagnosis not present

## 2022-09-10 DIAGNOSIS — N1831 Chronic kidney disease, stage 3a: Secondary | ICD-10-CM | POA: Diagnosis not present

## 2022-09-18 DIAGNOSIS — M6281 Muscle weakness (generalized): Secondary | ICD-10-CM | POA: Diagnosis not present

## 2022-09-18 DIAGNOSIS — E1122 Type 2 diabetes mellitus with diabetic chronic kidney disease: Secondary | ICD-10-CM | POA: Diagnosis not present

## 2022-09-18 DIAGNOSIS — I129 Hypertensive chronic kidney disease with stage 1 through stage 4 chronic kidney disease, or unspecified chronic kidney disease: Secondary | ICD-10-CM | POA: Diagnosis not present

## 2022-09-18 DIAGNOSIS — M6259 Muscle wasting and atrophy, not elsewhere classified, multiple sites: Secondary | ICD-10-CM | POA: Diagnosis not present

## 2022-09-18 DIAGNOSIS — N1831 Chronic kidney disease, stage 3a: Secondary | ICD-10-CM | POA: Diagnosis not present

## 2022-09-18 DIAGNOSIS — E11319 Type 2 diabetes mellitus with unspecified diabetic retinopathy without macular edema: Secondary | ICD-10-CM | POA: Diagnosis not present

## 2022-09-22 DIAGNOSIS — I10 Essential (primary) hypertension: Secondary | ICD-10-CM | POA: Diagnosis not present

## 2022-09-22 DIAGNOSIS — N1831 Chronic kidney disease, stage 3a: Secondary | ICD-10-CM | POA: Diagnosis not present

## 2022-09-24 ENCOUNTER — Other Ambulatory Visit: Payer: Self-pay | Admitting: Family Medicine

## 2022-09-25 ENCOUNTER — Telehealth: Payer: Self-pay | Admitting: Family Medicine

## 2022-09-25 ENCOUNTER — Encounter: Payer: Self-pay | Admitting: Emergency Medicine

## 2022-09-25 ENCOUNTER — Emergency Department: Payer: Medicare Other

## 2022-09-25 ENCOUNTER — Emergency Department
Admission: EM | Admit: 2022-09-25 | Discharge: 2022-09-25 | Disposition: A | Discharge status: Home / Self Care | Payer: Medicare Other | Attending: Emergency Medicine | Admitting: Emergency Medicine

## 2022-09-25 ENCOUNTER — Other Ambulatory Visit: Payer: Self-pay

## 2022-09-25 ENCOUNTER — Ambulatory Visit: Admit: 2022-09-25 | Discharge status: Against Medical Advice | Payer: Medicare Other

## 2022-09-25 DIAGNOSIS — R079 Chest pain, unspecified: Secondary | ICD-10-CM | POA: Diagnosis not present

## 2022-09-25 DIAGNOSIS — E1122 Type 2 diabetes mellitus with diabetic chronic kidney disease: Secondary | ICD-10-CM | POA: Diagnosis not present

## 2022-09-25 DIAGNOSIS — N1831 Chronic kidney disease, stage 3a: Secondary | ICD-10-CM | POA: Diagnosis not present

## 2022-09-25 DIAGNOSIS — M6281 Muscle weakness (generalized): Secondary | ICD-10-CM | POA: Diagnosis not present

## 2022-09-25 DIAGNOSIS — I129 Hypertensive chronic kidney disease with stage 1 through stage 4 chronic kidney disease, or unspecified chronic kidney disease: Secondary | ICD-10-CM | POA: Insufficient documentation

## 2022-09-25 DIAGNOSIS — N189 Chronic kidney disease, unspecified: Secondary | ICD-10-CM | POA: Diagnosis not present

## 2022-09-25 DIAGNOSIS — E11319 Type 2 diabetes mellitus with unspecified diabetic retinopathy without macular edema: Secondary | ICD-10-CM | POA: Diagnosis not present

## 2022-09-25 DIAGNOSIS — M6259 Muscle wasting and atrophy, not elsewhere classified, multiple sites: Secondary | ICD-10-CM | POA: Diagnosis not present

## 2022-09-25 DIAGNOSIS — R0789 Other chest pain: Secondary | ICD-10-CM | POA: Diagnosis not present

## 2022-09-25 LAB — CBC
HCT: 38 % (ref 36.0–46.0)
Hemoglobin: 12.5 g/dL (ref 12.0–15.0)
MCH: 33.4 pg (ref 26.0–34.0)
MCHC: 32.9 g/dL (ref 30.0–36.0)
MCV: 101.6 fL — ABNORMAL HIGH (ref 80.0–100.0)
Platelets: 142 10*3/uL — ABNORMAL LOW (ref 150–400)
RBC: 3.74 MIL/uL — ABNORMAL LOW (ref 3.87–5.11)
RDW: 15.5 % (ref 11.5–15.5)
WBC: 4.5 10*3/uL (ref 4.0–10.5)
nRBC: 0 % (ref 0.0–0.2)

## 2022-09-25 LAB — BASIC METABOLIC PANEL
Anion gap: 8 (ref 5–15)
BUN: 24 mg/dL — ABNORMAL HIGH (ref 8–23)
CO2: 26 mmol/L (ref 22–32)
Calcium: 9.3 mg/dL (ref 8.9–10.3)
Chloride: 109 mmol/L (ref 98–111)
Creatinine, Ser: 1.04 mg/dL — ABNORMAL HIGH (ref 0.44–1.00)
GFR, Estimated: 53 mL/min — ABNORMAL LOW (ref >60)
Glucose, Bld: 137 mg/dL — ABNORMAL HIGH (ref 70–99)
Potassium: 3.4 mmol/L — ABNORMAL LOW (ref 3.5–5.1)
Sodium: 143 mmol/L (ref 135–145)

## 2022-09-25 LAB — TROPONIN I (HIGH SENSITIVITY): Troponin I (High Sensitivity): 10 ng/L (ref <18)

## 2022-09-25 NOTE — ED Triage Notes (Signed)
First Nurse Note;  Pt via POV from [facility], pt c/o intermittent L sided chest pain for the past couple of days. Pt is A&Ox4 and NAD .   83 HR  100% on RA 186/78 BP

## 2022-09-25 NOTE — Telephone Encounter (Signed)
I called and spoke with Sharee Pimple and informed her that the provider felt the patient needed to be seen about her chest pain, Sharee Pimple informed me to call the patients daughter, I called and informed the daughter and she is making the patient an appointment at [facility] urgent care today.  Justinian Miano,cma

## 2022-09-25 NOTE — ED Provider Notes (Signed)
[facility] Provider Note    Event Date/Time   First MD Initiated Contact with Patient 09/25/22 1724     (approximate)   History   Chief Complaint Chest Pain   HPI  Laurie Harris is a 82 y.o. female with past medical history of hypertension, hyperlipidemia, diabetes, CKD, and GERD who presents to the ED complaining of chest pain.  Patient reports that she has been dealing with intermittent chest pain "for years."  She did not have any pain in her chest today, states her last episode was multiple days ago, but she reported these symptoms to her home health aide, who then called her PCP and recommended she be evaluated in the ED.  Patient states that she will have dull aching pain that last for a few seconds at a time, does not seem to be brought on by anything in particular.  She denies any difficulty breathing and has not had any fevers or cough.  She denies any nausea, vomiting, or abdominal pain.  She states she did not necessarily want to come to the ED for evaluation and is now requesting to be discharged home so that she can eat dinner.  She feels well currently and denies any complaints.     Physical Exam   Triage Vital Signs: ED Triage Vitals  Enc Vitals Group     BP 09/25/22 1520 (!) 179/69     Pulse Rate 09/25/22 1520 72     Resp 09/25/22 1520 18     Temp 09/25/22 1520 98.1 F (36.7 C)     Temp Source 09/25/22 1520 Oral     SpO2 09/25/22 1520 100 %     Weight --      Height --      Head Circumference --      Peak Flow --      Pain Score 09/25/22 1519 0     Pain Loc --      Pain Edu? --      Excl. in [place]? --     Most recent vital signs: Vitals:   09/25/22 1520  BP: (!) 179/69  Pulse: 72  Resp: 18  Temp: 98.1 F (36.7 C)  SpO2: 100%    Constitutional: Alert and oriented. Eyes: Conjunctivae are normal. Head: Atraumatic. Nose: No congestion/rhinnorhea. Mouth/Throat: Mucous membranes are moist.  Cardiovascular: Normal rate,  regular rhythm. Grossly normal heart sounds.  2+ radial pulses bilaterally. Respiratory: Normal respiratory effort.  No retractions. Lungs CTAB.  No chest wall tenderness to palpation. Gastrointestinal: Soft and nontender. No distention. Musculoskeletal: No lower extremity tenderness nor edema.  Neurologic:  Normal speech and language. No gross focal neurologic deficits are appreciated.    ED Results / Procedures / Treatments   Labs (all labs ordered are listed, but only abnormal results are displayed) Labs Reviewed  BASIC METABOLIC PANEL - Abnormal; Notable for the following components:      Result Value   Potassium 3.4 (*)    Glucose, Bld 137 (*)    BUN 24 (*)    Creatinine, Ser 1.04 (*)    GFR, Estimated 53 (*)    All other components within normal limits  CBC - Abnormal; Notable for the following components:   RBC 3.74 (*)    MCV 101.6 (*)    Platelets 142 (*)    All other components within normal limits  TROPONIN I (HIGH SENSITIVITY)  TROPONIN I (HIGH SENSITIVITY)     EKG  ED ECG  REPORT I, Blake Divine, the attending physician, personally viewed and interpreted this ECG.   Date: 09/25/2022  EKG Time: 15:23  Rate: 77  Rhythm: normal sinus rhythm  Axis: Normal  Intervals:none  ST&T Change: None  RADIOLOGY Chest x-ray reviewed and interpreted by me with no infiltrate, edema, or effusion.  PROCEDURES:  Critical Care performed: No  Procedures   MEDICATIONS ORDERED IN ED: Medications - No data to display   IMPRESSION / MDM / [place] / ED COURSE  I reviewed the triage vital signs and the nursing notes.                              82 y.o. female with past medical history of hypertension, hyperlipidemia, diabetes, CKD, and GERD who presents to the ED complaining of intermittent chest pain for multiple years with no symptoms currently.  Patient's presentation is most consistent with acute presentation with potential threat to life or bodily  function.  Differential diagnosis includes, but is not limited to, ACS, arrhythmia, PE, dissection, pneumonia, pneumothorax, GERD, musculoskeletal pain, anemia.  Patient well-appearing and in no acute distress, vital signs are unremarkable.  EKG shows no evidence of arrhythmia or ischemia and symptoms are atypical for ACS with no chest pain currently.  Troponin within normal limits and I doubt ACS.  Given reassuring vital signs with no active chest pain, doubt PE or dissection.  Additional labs are reassuring with no significant anemia, leukocytosis, electrolyte abnormality, or AKI.  Chest x-ray is also unremarkable.  Patient is appropriate for discharge home with PCP follow-up, was counseled to return to the ED for new or worsening symptoms, patient agrees with plan.      FINAL CLINICAL IMPRESSION(S) / ED DIAGNOSES   Final diagnoses:  Nonspecific chest pain     Rx / DC Orders   ED Discharge Orders     None        Note:  This document was prepared using Dragon voice recognition software and may include unintentional dictation errors.   Blake Divine, MD 09/25/22 1736

## 2022-09-25 NOTE — ED Triage Notes (Signed)
Patient to ED for chest pain from [facility] (lives at [facility]). States pain has been ongoing "for years." Points to middle of chest when asked where the pain is located. Denies cardiac hx.

## 2022-09-25 NOTE — Telephone Encounter (Signed)
Sharee Pimple from [facility] called stating pt is having chest pains on and off. Pt stated it was nothing new but the staff and home health was unaware of it until now

## 2022-10-03 ENCOUNTER — Other Ambulatory Visit: Payer: Self-pay | Admitting: Cardiology

## 2022-10-03 DIAGNOSIS — M6259 Muscle wasting and atrophy, not elsewhere classified, multiple sites: Secondary | ICD-10-CM | POA: Diagnosis not present

## 2022-10-03 DIAGNOSIS — I129 Hypertensive chronic kidney disease with stage 1 through stage 4 chronic kidney disease, or unspecified chronic kidney disease: Secondary | ICD-10-CM | POA: Diagnosis not present

## 2022-10-03 DIAGNOSIS — N1831 Chronic kidney disease, stage 3a: Secondary | ICD-10-CM | POA: Diagnosis not present

## 2022-10-03 DIAGNOSIS — I6523 Occlusion and stenosis of bilateral carotid arteries: Secondary | ICD-10-CM

## 2022-10-03 DIAGNOSIS — E1122 Type 2 diabetes mellitus with diabetic chronic kidney disease: Secondary | ICD-10-CM | POA: Diagnosis not present

## 2022-10-03 DIAGNOSIS — M6281 Muscle weakness (generalized): Secondary | ICD-10-CM | POA: Diagnosis not present

## 2022-10-03 DIAGNOSIS — E11319 Type 2 diabetes mellitus with unspecified diabetic retinopathy without macular edema: Secondary | ICD-10-CM | POA: Diagnosis not present

## 2022-10-04 ENCOUNTER — Other Ambulatory Visit: Payer: Self-pay | Admitting: Family Medicine

## 2022-10-08 DIAGNOSIS — M810 Age-related osteoporosis without current pathological fracture: Secondary | ICD-10-CM | POA: Diagnosis not present

## 2022-10-08 DIAGNOSIS — I779 Disorder of arteries and arterioles, unspecified: Secondary | ICD-10-CM | POA: Diagnosis not present

## 2022-10-08 DIAGNOSIS — R1312 Dysphagia, oropharyngeal phase: Secondary | ICD-10-CM | POA: Diagnosis not present

## 2022-10-08 DIAGNOSIS — M6281 Muscle weakness (generalized): Secondary | ICD-10-CM | POA: Diagnosis not present

## 2022-10-08 DIAGNOSIS — N1831 Chronic kidney disease, stage 3a: Secondary | ICD-10-CM | POA: Diagnosis not present

## 2022-10-08 DIAGNOSIS — K219 Gastro-esophageal reflux disease without esophagitis: Secondary | ICD-10-CM | POA: Diagnosis not present

## 2022-10-08 DIAGNOSIS — I129 Hypertensive chronic kidney disease with stage 1 through stage 4 chronic kidney disease, or unspecified chronic kidney disease: Secondary | ICD-10-CM | POA: Diagnosis not present

## 2022-10-08 DIAGNOSIS — E785 Hyperlipidemia, unspecified: Secondary | ICD-10-CM | POA: Diagnosis not present

## 2022-10-08 DIAGNOSIS — F329 Major depressive disorder, single episode, unspecified: Secondary | ICD-10-CM | POA: Diagnosis not present

## 2022-10-08 DIAGNOSIS — K5900 Constipation, unspecified: Secondary | ICD-10-CM | POA: Diagnosis not present

## 2022-10-08 DIAGNOSIS — M6259 Muscle wasting and atrophy, not elsewhere classified, multiple sites: Secondary | ICD-10-CM | POA: Diagnosis not present

## 2022-10-08 DIAGNOSIS — I2510 Atherosclerotic heart disease of native coronary artery without angina pectoris: Secondary | ICD-10-CM | POA: Diagnosis not present

## 2022-10-08 DIAGNOSIS — Z9181 History of falling: Secondary | ICD-10-CM | POA: Diagnosis not present

## 2022-10-08 DIAGNOSIS — E11319 Type 2 diabetes mellitus with unspecified diabetic retinopathy without macular edema: Secondary | ICD-10-CM | POA: Diagnosis not present

## 2022-10-08 DIAGNOSIS — E1122 Type 2 diabetes mellitus with diabetic chronic kidney disease: Secondary | ICD-10-CM | POA: Diagnosis not present

## 2022-10-08 DIAGNOSIS — Z794 Long term (current) use of insulin: Secondary | ICD-10-CM | POA: Diagnosis not present

## 2022-10-08 DIAGNOSIS — Z79891 Long term (current) use of opiate analgesic: Secondary | ICD-10-CM | POA: Diagnosis not present

## 2022-10-09 DIAGNOSIS — E11319 Type 2 diabetes mellitus with unspecified diabetic retinopathy without macular edema: Secondary | ICD-10-CM | POA: Diagnosis not present

## 2022-10-09 DIAGNOSIS — N1831 Chronic kidney disease, stage 3a: Secondary | ICD-10-CM | POA: Diagnosis not present

## 2022-10-09 DIAGNOSIS — M6281 Muscle weakness (generalized): Secondary | ICD-10-CM | POA: Diagnosis not present

## 2022-10-09 DIAGNOSIS — I129 Hypertensive chronic kidney disease with stage 1 through stage 4 chronic kidney disease, or unspecified chronic kidney disease: Secondary | ICD-10-CM | POA: Diagnosis not present

## 2022-10-09 DIAGNOSIS — E1122 Type 2 diabetes mellitus with diabetic chronic kidney disease: Secondary | ICD-10-CM | POA: Diagnosis not present

## 2022-10-09 DIAGNOSIS — M6259 Muscle wasting and atrophy, not elsewhere classified, multiple sites: Secondary | ICD-10-CM | POA: Diagnosis not present

## 2022-10-11 ENCOUNTER — Other Ambulatory Visit: Payer: Self-pay | Admitting: Family Medicine

## 2022-10-16 DIAGNOSIS — I129 Hypertensive chronic kidney disease with stage 1 through stage 4 chronic kidney disease, or unspecified chronic kidney disease: Secondary | ICD-10-CM | POA: Diagnosis not present

## 2022-10-16 DIAGNOSIS — M6259 Muscle wasting and atrophy, not elsewhere classified, multiple sites: Secondary | ICD-10-CM | POA: Diagnosis not present

## 2022-10-16 DIAGNOSIS — E11319 Type 2 diabetes mellitus with unspecified diabetic retinopathy without macular edema: Secondary | ICD-10-CM | POA: Diagnosis not present

## 2022-10-16 DIAGNOSIS — E1122 Type 2 diabetes mellitus with diabetic chronic kidney disease: Secondary | ICD-10-CM | POA: Diagnosis not present

## 2022-10-16 DIAGNOSIS — M6281 Muscle weakness (generalized): Secondary | ICD-10-CM | POA: Diagnosis not present

## 2022-10-16 DIAGNOSIS — N1831 Chronic kidney disease, stage 3a: Secondary | ICD-10-CM | POA: Diagnosis not present

## 2022-10-23 DIAGNOSIS — E11319 Type 2 diabetes mellitus with unspecified diabetic retinopathy without macular edema: Secondary | ICD-10-CM | POA: Diagnosis not present

## 2022-10-23 DIAGNOSIS — M6259 Muscle wasting and atrophy, not elsewhere classified, multiple sites: Secondary | ICD-10-CM | POA: Diagnosis not present

## 2022-10-23 DIAGNOSIS — N1831 Chronic kidney disease, stage 3a: Secondary | ICD-10-CM | POA: Diagnosis not present

## 2022-10-23 DIAGNOSIS — I129 Hypertensive chronic kidney disease with stage 1 through stage 4 chronic kidney disease, or unspecified chronic kidney disease: Secondary | ICD-10-CM | POA: Diagnosis not present

## 2022-10-23 DIAGNOSIS — E1122 Type 2 diabetes mellitus with diabetic chronic kidney disease: Secondary | ICD-10-CM | POA: Diagnosis not present

## 2022-10-23 DIAGNOSIS — M6281 Muscle weakness (generalized): Secondary | ICD-10-CM | POA: Diagnosis not present

## 2022-10-26 ENCOUNTER — Encounter: Payer: Self-pay | Admitting: Family Medicine

## 2022-10-26 ENCOUNTER — Ambulatory Visit (INDEPENDENT_AMBULATORY_CARE_PROVIDER_SITE_OTHER): Payer: Medicare Other | Admitting: Family Medicine

## 2022-10-26 VITALS — BP 120/64 | HR 89 | Temp 97.8°F | Ht 66.0 in | Wt 116.6 lb

## 2022-10-26 DIAGNOSIS — R413 Other amnesia: Secondary | ICD-10-CM | POA: Diagnosis not present

## 2022-10-26 DIAGNOSIS — E119 Type 2 diabetes mellitus without complications: Secondary | ICD-10-CM

## 2022-10-26 DIAGNOSIS — I10 Essential (primary) hypertension: Secondary | ICD-10-CM | POA: Diagnosis not present

## 2022-10-26 DIAGNOSIS — E7800 Pure hypercholesterolemia, unspecified: Secondary | ICD-10-CM | POA: Diagnosis not present

## 2022-10-26 DIAGNOSIS — Z794 Long term (current) use of insulin: Secondary | ICD-10-CM | POA: Diagnosis not present

## 2022-10-26 DIAGNOSIS — I6523 Occlusion and stenosis of bilateral carotid arteries: Secondary | ICD-10-CM

## 2022-10-26 LAB — MICROALBUMIN / CREATININE URINE RATIO
Creatinine,U: 59.2 mg/dL
Microalb Creat Ratio: 4.3 mg/g (ref 0.0–30.0)
Microalb, Ur: 2.6 mg/dL — ABNORMAL HIGH (ref 0.0–1.9)

## 2022-10-26 LAB — POCT GLYCOSYLATED HEMOGLOBIN (HGB A1C): Hemoglobin A1C: 6.1 % — AB (ref 4.0–5.6)

## 2022-10-26 NOTE — Assessment & Plan Note (Signed)
Chronic issue.  Well-controlled.  She will continue Humalog 75/25 14 units twice daily.

## 2022-10-26 NOTE — Assessment & Plan Note (Signed)
Chronic issue.  Well-controlled.  She will continue benazepril 5 mg daily.

## 2022-10-26 NOTE — Progress Notes (Signed)
1

## 2022-10-26 NOTE — Assessment & Plan Note (Signed)
Chronic issue.  Continue Crestor 40 mg daily.

## 2022-10-26 NOTE — Patient Instructions (Signed)
Nice to see you!   

## 2022-10-26 NOTE — Progress Notes (Signed)
Tommi Rumps, MD Phone: [phone]  Laurie Harris is a 82 y.o. female who presents today for f/u.  DIABETES Disease Monitoring: Blood Sugar ranges-130-150, excursion to 200 with cake Polyuria/phagia/dipsia- no      Optho- UTD Medications: Compliance- taking humalog 75/25 14 u BID Hypoglycemic symptoms- no  HYPERTENSION Disease Monitoring Home BP Monitoring not sure on systolic, though notes diastolic is typically in the 70s Chest pain- no    Dyspnea- no Medications Compliance-  taking benazepril. Lightheadedness-  only if she bends over and gets up  Edema- no BMET    Component Value Date/Time   NA 143 09/25/2022 1523   NA 139 11/05/2019 0000   NA 141 12/31/2014 1646   K 3.4 (L) 09/25/2022 1523   K 3.4 (L) 12/31/2014 1646   CL 109 09/25/2022 1523   CL 110 12/31/2014 1646   CO2 26 09/25/2022 1523   CO2 22 12/31/2014 1646   GLUCOSE 137 (H) 09/25/2022 1523   GLUCOSE 174 (H) 12/31/2014 1646   BUN 24 (H) 09/25/2022 1523   BUN 24 (A) 11/05/2019 0000   BUN 32 (H) 12/31/2014 1646   CREATININE 1.04 (H) 09/25/2022 1523   CREATININE 0.86 07/20/2022 1612   CALCIUM 9.3 09/25/2022 1523   CALCIUM 9.0 12/31/2014 1646   GFRNONAA 53 (L) 09/25/2022 1523   GFRNONAA 53 (L) 12/31/2014 1646   GFRAA 48 (L) 12/08/2019 2205   GFRAA >60 12/31/2014 1646   HYPERLIPIDEMIA Symptoms Chest pain on exertion:  no   Medications: Compliance- taking crestor Right upper quadrant pain- no  Muscle aches- no Lipid Panel     Component Value Date/Time   CHOL 129 12/19/2021 1007   TRIG 226.0 (H) 12/19/2021 1007   HDL 36.80 (L) 12/19/2021 1007   CHOLHDL 4 12/19/2021 1007   VLDL 45.2 (H) 12/19/2021 1007   LDLCALC 52 11/05/2019 0000   LDLDIRECT 62.0 12/19/2021 1007   Memory difficulty: notes some days she can remember things and other she can't. Daughter notes she tells her things multiple times.  Does note now needing help with getting dressed given the lightheadedness when she bends over and  needs help with bathing.  Daughter continues to work with long-term care insurance   Social History   Tobacco Use  Smoking Status Never  Smokeless Tobacco Never    Current Outpatient Medications on File Prior to Visit  Medication Sig Dispense Refill   acetaminophen (TYLENOL) 325 MG tablet Take 650 mg by mouth every 6 (six) hours as needed.     aspirin EC 81 MG tablet Take 81 mg by mouth daily.     BD AUTOSHIELD DUO 30G X 5 MM MISC USE TWICE A DAY. 60 each 10   benazepril (LOTENSIN) 5 MG tablet TAKE ONE TABLET EVERY DAY (Patient taking differently: Take 5 mg by mouth daily.) 90 tablet 2   calcium carbonate (OS-CAL) 600 MG TABS tablet Take one tablet by mouth once a day. 30 tablet 10   Cholecalciferol (VITAMIN D3) 50 MCG (2000 UT) TABS Take 2,000 Units by mouth daily.     fenofibrate 160 MG tablet Take 1 tablet (160 mg total) by mouth daily. 90 tablet 1   HUMALOG MIX 75/25 KWIKPEN (75-25) 100 UNIT/ML KwikPen INJECT 14 UNITS SUBCUTANEOUSLY TWICE A DAY WITH A MEAL. 15 mL 10   hydrocortisone (ANUSOL-HC) 2.5 % rectal cream Place 1 Application rectally 2 (two) times daily. 30 g 1   Multiple Vitamin (MULTIVITAMIN WITH MINERALS) TABS tablet Take 1 tablet by mouth daily.  NOVOFINE PLUS 32G X 4 MM MISC USE TWICE A DAY AS DIRECTED 100 each 6   pantoprazole (PROTONIX) 40 MG tablet TAKE 1 TABLET BY MOUTH EVERY-OTHER-DAY (Patient taking differently: Take 40 mg by mouth every other day.) 15 tablet 10   rosuvastatin (CRESTOR) 40 MG tablet TAKE (1) TABLET BY MOUTH ONCE A DAY (Patient taking differently: Take 40 mg by mouth daily.) 30 tablet 10   No current facility-administered medications on file prior to visit.     ROS see history of present illness  Objective  Physical Exam Vitals:   10/26/22 1021  BP: 120/64  Pulse: 89  Temp: 97.8 F (36.6 C)  SpO2: 99%    BP Readings from Last 3 Encounters:  10/26/22 120/64  09/25/22 (!) 181/76  07/20/22 (!) 140/70   Wt Readings from Last 3  Encounters:  10/26/22 116 lb 9.6 oz (52.9 kg)  08/08/22 121 lb (54.9 kg)  07/20/22 121 lb 3.2 oz (55 kg)    Physical Exam Constitutional:      General: She is not in acute distress.    Appearance: She is not diaphoretic.  Cardiovascular:     Rate and Rhythm: Normal rate and regular rhythm.     Heart sounds: Normal heart sounds.  Pulmonary:     Effort: Pulmonary effort is normal.     Breath sounds: Normal breath sounds.  Skin:    General: Skin is warm and dry.  Neurological:     Mental Status: She is alert.      Assessment/Plan: Please see individual problem list.  Benign essential HTN Assessment & Plan: Chronic issue.  Well-controlled.  She will continue benazepril 5 mg daily.   Memory difficulty Assessment & Plan: Chronic issue.  Mild in nature.  She does have issues with 2 ADLs now.  Advised I could fill out paperwork for long-term care insurance if needed.   Type 2 diabetes mellitus without complication, with long-term current use of insulin (HCC) -     POCT glycosylated hemoglobin (Hb A1C) -     Microalbumin / creatinine urine ratio  Pure hypercholesterolemia Assessment & Plan: Chronic issue.  Continue Crestor 40 mg daily.     Return in about 6 months (around 04/26/2023).   Tommi Rumps, MD [place]

## 2022-10-26 NOTE — Assessment & Plan Note (Signed)
Chronic issue.  Mild in nature.  She does have issues with 2 ADLs now.  Advised I could fill out paperwork for long-term care insurance if needed.

## 2022-10-27 DIAGNOSIS — I10 Essential (primary) hypertension: Secondary | ICD-10-CM | POA: Diagnosis not present

## 2022-10-27 DIAGNOSIS — N1831 Chronic kidney disease, stage 3a: Secondary | ICD-10-CM | POA: Diagnosis not present

## 2022-11-01 DIAGNOSIS — M6259 Muscle wasting and atrophy, not elsewhere classified, multiple sites: Secondary | ICD-10-CM | POA: Diagnosis not present

## 2022-11-01 DIAGNOSIS — E1122 Type 2 diabetes mellitus with diabetic chronic kidney disease: Secondary | ICD-10-CM | POA: Diagnosis not present

## 2022-11-01 DIAGNOSIS — N1831 Chronic kidney disease, stage 3a: Secondary | ICD-10-CM | POA: Diagnosis not present

## 2022-11-01 DIAGNOSIS — I129 Hypertensive chronic kidney disease with stage 1 through stage 4 chronic kidney disease, or unspecified chronic kidney disease: Secondary | ICD-10-CM | POA: Diagnosis not present

## 2022-11-01 DIAGNOSIS — M6281 Muscle weakness (generalized): Secondary | ICD-10-CM | POA: Diagnosis not present

## 2022-11-01 DIAGNOSIS — E11319 Type 2 diabetes mellitus with unspecified diabetic retinopathy without macular edema: Secondary | ICD-10-CM | POA: Diagnosis not present

## 2022-11-06 DIAGNOSIS — M6259 Muscle wasting and atrophy, not elsewhere classified, multiple sites: Secondary | ICD-10-CM | POA: Diagnosis not present

## 2022-11-06 DIAGNOSIS — N1831 Chronic kidney disease, stage 3a: Secondary | ICD-10-CM | POA: Diagnosis not present

## 2022-11-06 DIAGNOSIS — M6281 Muscle weakness (generalized): Secondary | ICD-10-CM | POA: Diagnosis not present

## 2022-11-06 DIAGNOSIS — I129 Hypertensive chronic kidney disease with stage 1 through stage 4 chronic kidney disease, or unspecified chronic kidney disease: Secondary | ICD-10-CM | POA: Diagnosis not present

## 2022-11-06 DIAGNOSIS — E11319 Type 2 diabetes mellitus with unspecified diabetic retinopathy without macular edema: Secondary | ICD-10-CM | POA: Diagnosis not present

## 2022-11-06 DIAGNOSIS — E1122 Type 2 diabetes mellitus with diabetic chronic kidney disease: Secondary | ICD-10-CM | POA: Diagnosis not present

## 2022-11-07 DIAGNOSIS — Z79891 Long term (current) use of opiate analgesic: Secondary | ICD-10-CM | POA: Diagnosis not present

## 2022-11-07 DIAGNOSIS — Z794 Long term (current) use of insulin: Secondary | ICD-10-CM | POA: Diagnosis not present

## 2022-11-07 DIAGNOSIS — E11319 Type 2 diabetes mellitus with unspecified diabetic retinopathy without macular edema: Secondary | ICD-10-CM | POA: Diagnosis not present

## 2022-11-07 DIAGNOSIS — N1831 Chronic kidney disease, stage 3a: Secondary | ICD-10-CM | POA: Diagnosis not present

## 2022-11-07 DIAGNOSIS — K219 Gastro-esophageal reflux disease without esophagitis: Secondary | ICD-10-CM | POA: Diagnosis not present

## 2022-11-07 DIAGNOSIS — M6281 Muscle weakness (generalized): Secondary | ICD-10-CM | POA: Diagnosis not present

## 2022-11-07 DIAGNOSIS — R1312 Dysphagia, oropharyngeal phase: Secondary | ICD-10-CM | POA: Diagnosis not present

## 2022-11-07 DIAGNOSIS — M810 Age-related osteoporosis without current pathological fracture: Secondary | ICD-10-CM | POA: Diagnosis not present

## 2022-11-07 DIAGNOSIS — K5900 Constipation, unspecified: Secondary | ICD-10-CM | POA: Diagnosis not present

## 2022-11-07 DIAGNOSIS — I129 Hypertensive chronic kidney disease with stage 1 through stage 4 chronic kidney disease, or unspecified chronic kidney disease: Secondary | ICD-10-CM | POA: Diagnosis not present

## 2022-11-07 DIAGNOSIS — Z9181 History of falling: Secondary | ICD-10-CM | POA: Diagnosis not present

## 2022-11-07 DIAGNOSIS — E1122 Type 2 diabetes mellitus with diabetic chronic kidney disease: Secondary | ICD-10-CM | POA: Diagnosis not present

## 2022-11-07 DIAGNOSIS — M6259 Muscle wasting and atrophy, not elsewhere classified, multiple sites: Secondary | ICD-10-CM | POA: Diagnosis not present

## 2022-11-07 DIAGNOSIS — I779 Disorder of arteries and arterioles, unspecified: Secondary | ICD-10-CM | POA: Diagnosis not present

## 2022-11-07 DIAGNOSIS — I2510 Atherosclerotic heart disease of native coronary artery without angina pectoris: Secondary | ICD-10-CM | POA: Diagnosis not present

## 2022-11-07 DIAGNOSIS — F329 Major depressive disorder, single episode, unspecified: Secondary | ICD-10-CM | POA: Diagnosis not present

## 2022-11-07 DIAGNOSIS — E785 Hyperlipidemia, unspecified: Secondary | ICD-10-CM | POA: Diagnosis not present

## 2022-11-08 ENCOUNTER — Ambulatory Visit: Payer: Medicare Other | Admitting: Cardiovascular Disease

## 2022-11-08 ENCOUNTER — Ambulatory Visit: Payer: Medicare Other | Admitting: Nurse Practitioner

## 2022-11-08 DIAGNOSIS — N1831 Chronic kidney disease, stage 3a: Secondary | ICD-10-CM | POA: Diagnosis not present

## 2022-11-08 DIAGNOSIS — I10 Essential (primary) hypertension: Secondary | ICD-10-CM | POA: Diagnosis not present

## 2022-11-13 ENCOUNTER — Telehealth: Payer: Self-pay | Admitting: Family Medicine

## 2022-11-13 NOTE — Telephone Encounter (Signed)
Called Patient she states she has felt fine all day- no symptoms. Patient received 2 doses of Humalog this morning and after that her sugar was 120. Douglass Rivers gave her another dose of Humalog at 4:00 and after that her sugar was 109.

## 2022-11-13 NOTE — Telephone Encounter (Signed)
Laurie Harris at [facility] states the [employer] failed the first time this morning the Humalog kwik pen failed so they got another Humalog pen and gave her the approriate dose. Maudie Mercury states the Patient did not get a 4:00 dose either. Maudie Mercury states they will go by [employer] recommendation of no more Humalog medication for today not even for the sliding scale and check her sugars every hour to ensure they come up. Laurie Harris verbalized understanding and is agreeable to this and if any concerns or symptoms arise to call [facility] doctor on call or take the Patient to the ED.

## 2022-11-13 NOTE — Telephone Encounter (Signed)
This message was from 10 this am.  Need to know how pt is doing.  Has her sugar been checked today?  What is sugar now?  Has she been eating?

## 2022-11-13 NOTE — Telephone Encounter (Signed)
Linda from [facility] called, [phone]. Patient received  a double dose of her HUMALOG MIX 75/25 KWIKPEN (75-25) 100 UNIT/ML KwikPen. Should Laurie Harris hold the dose tonight? Should they check her sugar? What do they need to do.

## 2022-11-14 DIAGNOSIS — M6281 Muscle weakness (generalized): Secondary | ICD-10-CM | POA: Diagnosis not present

## 2022-11-14 DIAGNOSIS — E1122 Type 2 diabetes mellitus with diabetic chronic kidney disease: Secondary | ICD-10-CM | POA: Diagnosis not present

## 2022-11-14 DIAGNOSIS — N1831 Chronic kidney disease, stage 3a: Secondary | ICD-10-CM | POA: Diagnosis not present

## 2022-11-14 DIAGNOSIS — I129 Hypertensive chronic kidney disease with stage 1 through stage 4 chronic kidney disease, or unspecified chronic kidney disease: Secondary | ICD-10-CM | POA: Diagnosis not present

## 2022-11-14 DIAGNOSIS — M6259 Muscle wasting and atrophy, not elsewhere classified, multiple sites: Secondary | ICD-10-CM | POA: Diagnosis not present

## 2022-11-14 DIAGNOSIS — E11319 Type 2 diabetes mellitus with unspecified diabetic retinopathy without macular edema: Secondary | ICD-10-CM | POA: Diagnosis not present

## 2022-11-14 NOTE — Telephone Encounter (Signed)
FYI - received initial call yesterday as outlined.  Wanted Juliann Pulse to f/u today with pt.  Forwarding information to you for f/u.

## 2022-11-14 NOTE — Telephone Encounter (Signed)
Noted  

## 2022-11-14 NOTE — Telephone Encounter (Addendum)
Called spoke with [name] from [facility] [employer] [facility] in charge, [name] confirmed double dose was given by new med tech and 4 PM dose was given due to no new orders. Patient CBGs have remained above  100 last night insulin confirmed held CBGs were checked every hour through the night per [facility] facility protocol.  Patient well this morning. I advised facility if no cal within one hour for emergency calls to please call back and ask to speak with Nurse or someone in charge .

## 2022-11-20 DIAGNOSIS — E1122 Type 2 diabetes mellitus with diabetic chronic kidney disease: Secondary | ICD-10-CM | POA: Diagnosis not present

## 2022-11-20 DIAGNOSIS — I129 Hypertensive chronic kidney disease with stage 1 through stage 4 chronic kidney disease, or unspecified chronic kidney disease: Secondary | ICD-10-CM | POA: Diagnosis not present

## 2022-11-20 DIAGNOSIS — E11319 Type 2 diabetes mellitus with unspecified diabetic retinopathy without macular edema: Secondary | ICD-10-CM | POA: Diagnosis not present

## 2022-11-20 DIAGNOSIS — M6281 Muscle weakness (generalized): Secondary | ICD-10-CM | POA: Diagnosis not present

## 2022-11-20 DIAGNOSIS — N1831 Chronic kidney disease, stage 3a: Secondary | ICD-10-CM | POA: Diagnosis not present

## 2022-11-20 DIAGNOSIS — M6259 Muscle wasting and atrophy, not elsewhere classified, multiple sites: Secondary | ICD-10-CM | POA: Diagnosis not present

## 2022-11-27 DIAGNOSIS — E1122 Type 2 diabetes mellitus with diabetic chronic kidney disease: Secondary | ICD-10-CM | POA: Diagnosis not present

## 2022-11-27 DIAGNOSIS — I129 Hypertensive chronic kidney disease with stage 1 through stage 4 chronic kidney disease, or unspecified chronic kidney disease: Secondary | ICD-10-CM | POA: Diagnosis not present

## 2022-11-27 DIAGNOSIS — M6259 Muscle wasting and atrophy, not elsewhere classified, multiple sites: Secondary | ICD-10-CM | POA: Diagnosis not present

## 2022-11-27 DIAGNOSIS — E11319 Type 2 diabetes mellitus with unspecified diabetic retinopathy without macular edema: Secondary | ICD-10-CM | POA: Diagnosis not present

## 2022-11-27 DIAGNOSIS — M6281 Muscle weakness (generalized): Secondary | ICD-10-CM | POA: Diagnosis not present

## 2022-11-27 DIAGNOSIS — N1831 Chronic kidney disease, stage 3a: Secondary | ICD-10-CM | POA: Diagnosis not present

## 2022-12-03 DIAGNOSIS — N1831 Chronic kidney disease, stage 3a: Secondary | ICD-10-CM | POA: Diagnosis not present

## 2022-12-03 DIAGNOSIS — M6281 Muscle weakness (generalized): Secondary | ICD-10-CM | POA: Diagnosis not present

## 2022-12-03 DIAGNOSIS — I129 Hypertensive chronic kidney disease with stage 1 through stage 4 chronic kidney disease, or unspecified chronic kidney disease: Secondary | ICD-10-CM | POA: Diagnosis not present

## 2022-12-03 DIAGNOSIS — E11319 Type 2 diabetes mellitus with unspecified diabetic retinopathy without macular edema: Secondary | ICD-10-CM | POA: Diagnosis not present

## 2022-12-03 DIAGNOSIS — M6259 Muscle wasting and atrophy, not elsewhere classified, multiple sites: Secondary | ICD-10-CM | POA: Diagnosis not present

## 2022-12-03 DIAGNOSIS — E1122 Type 2 diabetes mellitus with diabetic chronic kidney disease: Secondary | ICD-10-CM | POA: Diagnosis not present

## 2022-12-05 ENCOUNTER — Encounter: Payer: Self-pay | Admitting: Cardiology

## 2022-12-05 ENCOUNTER — Ambulatory Visit: Payer: Medicare Other | Attending: Cardiovascular Disease | Admitting: Cardiology

## 2022-12-05 VITALS — BP 165/78 | HR 70 | Ht 66.0 in | Wt 117.0 lb

## 2022-12-05 DIAGNOSIS — I6930 Unspecified sequelae of cerebral infarction: Secondary | ICD-10-CM | POA: Insufficient documentation

## 2022-12-05 DIAGNOSIS — E785 Hyperlipidemia, unspecified: Secondary | ICD-10-CM | POA: Insufficient documentation

## 2022-12-05 DIAGNOSIS — I10 Essential (primary) hypertension: Secondary | ICD-10-CM | POA: Diagnosis not present

## 2022-12-05 DIAGNOSIS — I6523 Occlusion and stenosis of bilateral carotid arteries: Secondary | ICD-10-CM | POA: Insufficient documentation

## 2022-12-05 DIAGNOSIS — Z87898 Personal history of other specified conditions: Secondary | ICD-10-CM

## 2022-12-05 NOTE — Patient Instructions (Signed)
Medication Instructions:  No changes at this time.   *If you need a refill on your cardiac medications before your next appointment, please call your pharmacy*   Lab Work: None  If you have labs (blood work) drawn today and your tests are completely normal, you will receive your results only by: MyChart Message (if you have MyChart) OR A paper copy in the mail If you have any lab test that is abnormal or we need to change your treatment, we will call you to review the results.   Testing/Procedures: None   Follow-Up: At [facility], you and your health needs are our priority.  As part of our continuing mission to provide you with exceptional heart care, we have created designated Provider Care Teams.  These Care Teams include your primary Cardiologist (physician) and Advanced Practice Providers (APPs -  Physician Assistants and Nurse Practitioners) who all work together to provide you with the care you need, when you need it.   Your next appointment:   6 month(s)  Provider:   Muhammad Arida, MD or Sheri Hammock, NP     

## 2022-12-05 NOTE — Progress Notes (Signed)
Cardiology Office Note:   Date:  12/05/2022  ID:  LISA-JANE MCNEARY, DOB 09-15-1940, MRN [id number]  History of Present Illness:   Laurie Harris is a 82 y.o. 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  Echocardiogram completed in 2018 revealed a normal EF, G1 DD.  Carotid duplex in 09/2019 revealed ICA with less than 50% stenosis bilaterally.  Repeat echocardiogram in 09/2019 revealed LVEF 60 to 65%, mildly increased left ventricular hypertrophy, G1 DD, no regional wall motion abnormalities, mild/moderate aortic valve sclerosis without stenosis.  She was last seen in clinic 05/04/2022 accompanied by her daughter and she had been doing fairly well.  She denies any symptoms of chest pain shortness of breath or syncope.  She has not had any hospitalizations since 2021 and continues to reside at [facility]  No changes made to her medications and no testing that was ordered.  She presented to the [facility] emergency department 06/08/2022 with abdominal pain.  She had right lower quadrant pain radiating into the right flank, relatively was acute in onset and have been persistent since the night before.  She was found to have acute appendicitis and general surgery was consulted.  She underwent laparoscopic appendectomy on the evening of 06/08/2022 for concern of developing peritonitis with her acute appendicitis. She was started on IV antibiotics which was changed to oral antibiotics prior to discharge she continued to have a JP drain as per general surgery.  It was recommended that she be discharged to SNF and she was considered stable for discharge on 06/12/2022.  She presented  back to the emergency department on 06/18/2022 with recurrent abdominal pain.  She continued to have a JP drain in place to left lower quadrant with a small amount of serous appearing drainage.  Lipase was also mildly elevated below suspension for pancreatitis she was hydrated with IV fluids and sent for CT imaging she was treated with IV morphine and Zofran.  CT was reassuring with no acute findings.  She was subsequently started on pain medication and discharged back to SNF from the emergency department.  She presented to the [facility] emergency department again 09/25/2022 with complaints of chest pain.  Patient reports she will deal with intermittent chest pain for years.  She did not have any pain in her chest today states elapsed episode was multiple days ago but she reported the symptoms to her home health aide who then called her PCP and recommended to be evaluated in the ED.  Patient states that she will have a dull aching pain that lasts a few seconds at a time does not seem to be brought on by anything in particular.  Blood pressure was noted to be elevated on arrival to the emergency department at 179/69.  Pertinent labs were potassium 3.4, blood glucose of 137, BUN 24, serum creatinine 1.04, with GFR 53, instability troponin was negative at 10.  Workup was essentially unrevealing so she was stable for discharge and was discharged home.  She returns to clinic today.  She states that she has been doing well.  She has not had any further emergency department visits since January of this year.  She did change facilities but is now back at [facility].  She denies any current chest pain, shortness of breath, dyspnea on exertion.  She continues to walk with her walker.  She does continue to complain of the food at [facility].  ROS: Review of systems negative with exception of what listed in the HPI  Studies Reviewed:    EKG: Sinus rhythm with rate of 70 left axis deviation  TTE 09/18/19 1. Left  ventricular ejection fraction, by visual estimation, is 60 to  65%. The left ventricle has normal function. There is mildly increased  left ventricular hypertrophy.   2. Left ventricular diastolic parameters are consistent with Grade I  diastolic dysfunction (impaired relaxation).   3. The left ventricle has no regional wall motion abnormalities.   4. Global right ventricle has normal systolic function.The right  ventricular size is normal. No increase in right ventricular wall  thickness.   5. Left atrial size was normal.   6. The aortic valve is normal in structure. Aortic valve regurgitation is  not visualized. Mild to moderate aortic valve sclerosis/calcification  without any evidence of aortic stenosis.   7. TR signal is inadequate for assessing pulmonary artery systolic  pressure.   Heart Monitor 10/2019 Normal sinus rhythm avg HR of 74 bpm.    5 Supraventricular Tachycardia runs occurred, the run with the fastest interval lasting 4 beats with a max rate of 190 bpm, the longest lasting 6 beats with an avg rate of 127 bpm.    Isolated SVEs were rare (<1.0%), SVE Couplets were rare (<1.0%), and no SVE Triplets were present. Isolated VEs were rare (<1.0%), VE Couplets were rare (<1.0%), and no VE Triplets were present. Ventricular Bigeminy and Trigeminy were present.  Risk Assessment/Calculations:          Physical Exam:   VS:  BP (!) 165/78 (BP Location: Left Arm, Patient Position: Sitting, Cuff Size: Normal)   Pulse 70   Ht 5\' 6"  (1.676 m)   Wt 117 lb (53.1 kg)   SpO2 99%   BMI 18.88 kg/m    Wt Readings from Last 3 Encounters:  12/05/22 117 lb (53.1 kg)  10/26/22 116 lb 9.6 oz (52.9 kg)  08/08/22 121 lb (54.9 kg)     GEN: Well nourished, well developed in no acute distress NECK: No JVD; No carotid bruits CARDIAC: RRR, I-II/VI murmur, without rubs, gallops RESPIRATORY:  Clear to auscultation without rales, wheezing or rhonchi  ABDOMEN: Soft, non-tender,  non-distended EXTREMITIES:  No edema; No deformity   ASSESSMENT AND PLAN:   Essential hypertension with blood pressure 165/78.  At she continues to have her blood pressures take the facility 2 hours after today's medication have been improved.  Unfortunately today her blood pressure is slightly elevated she has little excited. She has been continued on benazepril 5 mg daily for her blood pressure.   Hyperlipidemia with an LDL of 62 2023.  She is continued on fenofibrate 60 mg daily rosuvastatin 40 mg daily.  This continues to be monitored by PCP, we have requested most recent labs from her PCP as well.  Bilateral carotid disease blood pressure is a carotid Doppler completed in February labs demonstrate stable 40 to 59% right carotid stenosis.  She has yearly follow-ups with the next study needed in 11/24, will need to be ordered at follow-up.  She has been continued on aspirin 81 mg daily, fenofibrate 160 mg daily as well as Crestor 40 mg daily.  History of syncopal events with no recent episodes.  History of hemorrhagic CVA where  she had previously been on warfarin in 2016.  Gait abnormalities, walking with rollator today.  Continued on aspirin and statin therapy.  Disposition patient return to clinic to see MD/APP in 6 months or sooner if needed.        Signed, Azarah Dacy, NP

## 2022-12-07 DIAGNOSIS — Z79891 Long term (current) use of opiate analgesic: Secondary | ICD-10-CM | POA: Diagnosis not present

## 2022-12-07 DIAGNOSIS — R1312 Dysphagia, oropharyngeal phase: Secondary | ICD-10-CM | POA: Diagnosis not present

## 2022-12-07 DIAGNOSIS — I2510 Atherosclerotic heart disease of native coronary artery without angina pectoris: Secondary | ICD-10-CM | POA: Diagnosis not present

## 2022-12-07 DIAGNOSIS — I779 Disorder of arteries and arterioles, unspecified: Secondary | ICD-10-CM | POA: Diagnosis not present

## 2022-12-07 DIAGNOSIS — M6281 Muscle weakness (generalized): Secondary | ICD-10-CM | POA: Diagnosis not present

## 2022-12-07 DIAGNOSIS — F329 Major depressive disorder, single episode, unspecified: Secondary | ICD-10-CM | POA: Diagnosis not present

## 2022-12-07 DIAGNOSIS — E11319 Type 2 diabetes mellitus with unspecified diabetic retinopathy without macular edema: Secondary | ICD-10-CM | POA: Diagnosis not present

## 2022-12-07 DIAGNOSIS — M810 Age-related osteoporosis without current pathological fracture: Secondary | ICD-10-CM | POA: Diagnosis not present

## 2022-12-07 DIAGNOSIS — Z794 Long term (current) use of insulin: Secondary | ICD-10-CM | POA: Diagnosis not present

## 2022-12-07 DIAGNOSIS — Z9181 History of falling: Secondary | ICD-10-CM | POA: Diagnosis not present

## 2022-12-07 DIAGNOSIS — N1831 Chronic kidney disease, stage 3a: Secondary | ICD-10-CM | POA: Diagnosis not present

## 2022-12-07 DIAGNOSIS — E1122 Type 2 diabetes mellitus with diabetic chronic kidney disease: Secondary | ICD-10-CM | POA: Diagnosis not present

## 2022-12-07 DIAGNOSIS — K5900 Constipation, unspecified: Secondary | ICD-10-CM | POA: Diagnosis not present

## 2022-12-07 DIAGNOSIS — M6259 Muscle wasting and atrophy, not elsewhere classified, multiple sites: Secondary | ICD-10-CM | POA: Diagnosis not present

## 2022-12-07 DIAGNOSIS — I129 Hypertensive chronic kidney disease with stage 1 through stage 4 chronic kidney disease, or unspecified chronic kidney disease: Secondary | ICD-10-CM | POA: Diagnosis not present

## 2022-12-07 DIAGNOSIS — K219 Gastro-esophageal reflux disease without esophagitis: Secondary | ICD-10-CM | POA: Diagnosis not present

## 2022-12-07 DIAGNOSIS — E785 Hyperlipidemia, unspecified: Secondary | ICD-10-CM | POA: Diagnosis not present

## 2022-12-10 DIAGNOSIS — E11319 Type 2 diabetes mellitus with unspecified diabetic retinopathy without macular edema: Secondary | ICD-10-CM | POA: Diagnosis not present

## 2022-12-10 DIAGNOSIS — I129 Hypertensive chronic kidney disease with stage 1 through stage 4 chronic kidney disease, or unspecified chronic kidney disease: Secondary | ICD-10-CM | POA: Diagnosis not present

## 2022-12-10 DIAGNOSIS — N1831 Chronic kidney disease, stage 3a: Secondary | ICD-10-CM | POA: Diagnosis not present

## 2022-12-10 DIAGNOSIS — M6281 Muscle weakness (generalized): Secondary | ICD-10-CM | POA: Diagnosis not present

## 2022-12-10 DIAGNOSIS — E1122 Type 2 diabetes mellitus with diabetic chronic kidney disease: Secondary | ICD-10-CM | POA: Diagnosis not present

## 2022-12-10 DIAGNOSIS — M6259 Muscle wasting and atrophy, not elsewhere classified, multiple sites: Secondary | ICD-10-CM | POA: Diagnosis not present

## 2022-12-17 DIAGNOSIS — N1831 Chronic kidney disease, stage 3a: Secondary | ICD-10-CM | POA: Diagnosis not present

## 2022-12-17 DIAGNOSIS — I10 Essential (primary) hypertension: Secondary | ICD-10-CM | POA: Diagnosis not present

## 2022-12-18 DIAGNOSIS — E11319 Type 2 diabetes mellitus with unspecified diabetic retinopathy without macular edema: Secondary | ICD-10-CM | POA: Diagnosis not present

## 2022-12-18 DIAGNOSIS — I129 Hypertensive chronic kidney disease with stage 1 through stage 4 chronic kidney disease, or unspecified chronic kidney disease: Secondary | ICD-10-CM | POA: Diagnosis not present

## 2022-12-18 DIAGNOSIS — E1122 Type 2 diabetes mellitus with diabetic chronic kidney disease: Secondary | ICD-10-CM | POA: Diagnosis not present

## 2022-12-18 DIAGNOSIS — N1831 Chronic kidney disease, stage 3a: Secondary | ICD-10-CM | POA: Diagnosis not present

## 2022-12-18 DIAGNOSIS — M6281 Muscle weakness (generalized): Secondary | ICD-10-CM | POA: Diagnosis not present

## 2022-12-18 DIAGNOSIS — M6259 Muscle wasting and atrophy, not elsewhere classified, multiple sites: Secondary | ICD-10-CM | POA: Diagnosis not present

## 2022-12-21 DIAGNOSIS — M79671 Pain in right foot: Secondary | ICD-10-CM | POA: Diagnosis not present

## 2022-12-21 DIAGNOSIS — M79672 Pain in left foot: Secondary | ICD-10-CM | POA: Diagnosis not present

## 2022-12-21 DIAGNOSIS — E1140 Type 2 diabetes mellitus with diabetic neuropathy, unspecified: Secondary | ICD-10-CM | POA: Diagnosis not present

## 2022-12-21 DIAGNOSIS — L600 Ingrowing nail: Secondary | ICD-10-CM | POA: Diagnosis not present

## 2022-12-21 DIAGNOSIS — B351 Tinea unguium: Secondary | ICD-10-CM | POA: Diagnosis not present

## 2022-12-25 DIAGNOSIS — E1122 Type 2 diabetes mellitus with diabetic chronic kidney disease: Secondary | ICD-10-CM | POA: Diagnosis not present

## 2022-12-25 DIAGNOSIS — E11319 Type 2 diabetes mellitus with unspecified diabetic retinopathy without macular edema: Secondary | ICD-10-CM | POA: Diagnosis not present

## 2022-12-25 DIAGNOSIS — M6281 Muscle weakness (generalized): Secondary | ICD-10-CM | POA: Diagnosis not present

## 2022-12-25 DIAGNOSIS — I129 Hypertensive chronic kidney disease with stage 1 through stage 4 chronic kidney disease, or unspecified chronic kidney disease: Secondary | ICD-10-CM | POA: Diagnosis not present

## 2022-12-25 DIAGNOSIS — M6259 Muscle wasting and atrophy, not elsewhere classified, multiple sites: Secondary | ICD-10-CM | POA: Diagnosis not present

## 2022-12-25 DIAGNOSIS — N1831 Chronic kidney disease, stage 3a: Secondary | ICD-10-CM | POA: Diagnosis not present

## 2023-01-02 DIAGNOSIS — E11319 Type 2 diabetes mellitus with unspecified diabetic retinopathy without macular edema: Secondary | ICD-10-CM | POA: Diagnosis not present

## 2023-01-02 DIAGNOSIS — M6281 Muscle weakness (generalized): Secondary | ICD-10-CM | POA: Diagnosis not present

## 2023-01-02 DIAGNOSIS — M6259 Muscle wasting and atrophy, not elsewhere classified, multiple sites: Secondary | ICD-10-CM | POA: Diagnosis not present

## 2023-01-02 DIAGNOSIS — I129 Hypertensive chronic kidney disease with stage 1 through stage 4 chronic kidney disease, or unspecified chronic kidney disease: Secondary | ICD-10-CM | POA: Diagnosis not present

## 2023-01-02 DIAGNOSIS — E1122 Type 2 diabetes mellitus with diabetic chronic kidney disease: Secondary | ICD-10-CM | POA: Diagnosis not present

## 2023-01-02 DIAGNOSIS — N1831 Chronic kidney disease, stage 3a: Secondary | ICD-10-CM | POA: Diagnosis not present

## 2023-04-03 ENCOUNTER — Encounter (INDEPENDENT_AMBULATORY_CARE_PROVIDER_SITE_OTHER): Payer: Self-pay

## 2023-04-25 ENCOUNTER — Telehealth: Payer: Self-pay | Admitting: Family Medicine

## 2023-04-25 NOTE — Telephone Encounter (Signed)
Called spoke to [place] at [facility] and she states on 04/24/23 and the morning of 04/25/23 the Patient had difficulty speaking. Maxine Glenn denies any other symptoms going on with the Patient and states the Patient is back to her normal self now.

## 2023-04-25 NOTE — Telephone Encounter (Signed)
Please see other phone note

## 2023-04-25 NOTE — Telephone Encounter (Signed)
Laurie Harris from [facility] called in stating that she saw something unusual from pt today. She stated her words were not the same, like having difficulty to speak. Laurie Harris stated, her vitals are good, sugar its 187, however, she just would like to let Dr. Birdie Sons knows. Any questions or concern, she's available @ [phone].

## 2023-04-25 NOTE — Telephone Encounter (Signed)
Called [place] and spoke to [place] due to [place] being gone for the day and gave her Dr. Purvis Sheffield recommendation for the Patient  to be see right away . Racquel states they will have her seen right away.

## 2023-04-26 ENCOUNTER — Ambulatory Visit: Payer: Medicare Other | Admitting: Family Medicine

## 2023-04-26 NOTE — Telephone Encounter (Signed)
Monica at [facility] states the med tech called the daughter and the daughter looked at her and said she will be ok until 05/03/23 when she sees you.

## 2023-04-26 NOTE — Telephone Encounter (Signed)
Please follow-up and see if she got evaluated yesterday. Thanks.

## 2023-04-26 NOTE — Telephone Encounter (Signed)
Called [place] they said call back after 1:00.

## 2023-04-26 NOTE — Telephone Encounter (Signed)
Noted. If she has recurrent symptoms then she needs to be evaluated sooner.

## 2023-04-26 NOTE — Telephone Encounter (Signed)
Called [place] and spoke to [place] with Dr. Purvis Sheffield recommendation then called the daughter Laurie Harris and spoke with her. Laurie Harris states she went over to [facility] last evening and her and a few employees of [facility] did a stroke test on her Mom and sat with her a while. [place] symptom Patient had at this time was walking which she always has. Patient showed them a journal of her blood sugars which are a little high from what she usually runs. Laurie Harris states when they mentioned UC the Patient got in an uproar so they decided to watch the Patient and if she has any more issues they will take her to [facility] walk in. If Patient is fine she will see Dr. Birdie Sons on 05/03/23.

## 2023-04-29 ENCOUNTER — Encounter: Payer: Self-pay | Admitting: Family Medicine

## 2023-05-03 ENCOUNTER — Encounter: Payer: Self-pay | Admitting: Family Medicine

## 2023-05-03 ENCOUNTER — Ambulatory Visit (INDEPENDENT_AMBULATORY_CARE_PROVIDER_SITE_OTHER): Payer: Medicare Other | Admitting: Family Medicine

## 2023-05-03 ENCOUNTER — Ambulatory Visit
Admission: RE | Admit: 2023-05-03 | Discharge: 2023-05-03 | Disposition: A | Discharge status: Home / Self Care | Payer: Medicare Other | Source: Ambulatory Visit | Attending: Family Medicine | Admitting: Family Medicine

## 2023-05-03 VITALS — BP 114/76 | HR 81 | Temp 97.8°F | Ht 66.0 in | Wt 119.6 lb

## 2023-05-03 DIAGNOSIS — Z794 Long term (current) use of insulin: Secondary | ICD-10-CM

## 2023-05-03 DIAGNOSIS — I10 Essential (primary) hypertension: Secondary | ICD-10-CM | POA: Diagnosis not present

## 2023-05-03 DIAGNOSIS — R4781 Slurred speech: Secondary | ICD-10-CM

## 2023-05-03 DIAGNOSIS — E1129 Type 2 diabetes mellitus with other diabetic kidney complication: Secondary | ICD-10-CM

## 2023-05-03 DIAGNOSIS — G319 Degenerative disease of nervous system, unspecified: Secondary | ICD-10-CM | POA: Diagnosis not present

## 2023-05-03 DIAGNOSIS — F4321 Adjustment disorder with depressed mood: Secondary | ICD-10-CM

## 2023-05-03 DIAGNOSIS — R29818 Other symptoms and signs involving the nervous system: Secondary | ICD-10-CM | POA: Diagnosis not present

## 2023-05-03 LAB — CBC
HCT: 38.1 % (ref 36.0–46.0)
Hemoglobin: 12.7 g/dL (ref 12.0–15.0)
MCHC: 33.4 g/dL (ref 30.0–36.0)
MCV: 101.7 fl — ABNORMAL HIGH (ref 78.0–100.0)
Platelets: 118 10*3/uL — ABNORMAL LOW (ref 150.0–400.0)
RBC: 3.75 Mil/uL — ABNORMAL LOW (ref 3.87–5.11)
RDW: 14.6 % (ref 11.5–15.5)
WBC: 4.5 10*3/uL (ref 4.0–10.5)

## 2023-05-03 LAB — LIPID PANEL
Cholesterol: 119 mg/dL (ref 0–200)
HDL: 41.1 mg/dL (ref >39.00)
LDL Cholesterol: 41 mg/dL (ref 0–99)
NonHDL: 77.4
Total CHOL/HDL Ratio: 3
Triglycerides: 183 mg/dL — ABNORMAL HIGH (ref 0.0–149.0)
VLDL: 36.6 mg/dL (ref 0.0–40.0)

## 2023-05-03 LAB — COMPREHENSIVE METABOLIC PANEL
ALT: 33 U/L (ref 0–35)
AST: 37 U/L (ref 0–37)
Albumin: 3.7 g/dL (ref 3.5–5.2)
Alkaline Phosphatase: 107 U/L (ref 39–117)
BUN: 29 mg/dL — ABNORMAL HIGH (ref 6–23)
CO2: 24 mEq/L (ref 19–32)
Calcium: 9.3 mg/dL (ref 8.4–10.5)
Chloride: 108 mEq/L (ref 96–112)
Creatinine, Ser: 1.12 mg/dL (ref 0.40–1.20)
GFR: 45.14 mL/min — ABNORMAL LOW (ref >60.00)
Glucose, Bld: 183 mg/dL — ABNORMAL HIGH (ref 70–99)
Potassium: 4.3 mEq/L (ref 3.5–5.1)
Sodium: 143 mEq/L (ref 135–145)
Total Bilirubin: 0.5 mg/dL (ref 0.2–1.2)
Total Protein: 6.1 g/dL (ref 6.0–8.3)

## 2023-05-03 LAB — TSH: TSH: 2.08 u[IU]/mL (ref 0.35–5.50)

## 2023-05-03 LAB — HEMOGLOBIN A1C: Hgb A1c MFr Bld: 7.2 % — ABNORMAL HIGH (ref 4.6–6.5)

## 2023-05-03 NOTE — Assessment & Plan Note (Signed)
Discussed that this is understandable given the loss of her son recently.  She will monitor.

## 2023-05-03 NOTE — Patient Instructions (Signed)
Nice to see you. We will get lab work today and an MRI.  If you have unilateral numbness or weakness or widespread numbness or weakness or mental status changes or worsening slurred speech please go to the emergency room.

## 2023-05-03 NOTE — Assessment & Plan Note (Addendum)
Patient with slurred speech and reported weakness.  She has slurred speech on exam today.  Otherwise neurological exam is reassuring.  Given that this has been going on for over a week we can obtain an outpatient MRI to start the workup.  Also checking lab work.  If she develops any worsening symptoms they will take her to the emergency department.

## 2023-05-03 NOTE — Assessment & Plan Note (Signed)
Chronic issue.  Undetermined control.  Check A1c.  Patient will continue 75/25 Humalog 14 units twice daily.

## 2023-05-03 NOTE — Progress Notes (Signed)
Marikay Alar, MD Phone: [phone]  Laurie Harris is a 82 y.o. female who presents today for follow-up.  Diabetes: Patient cannot remember all of her blood sugars.  She is taking 14 units of 75/25 Humalog twice daily.  No hypoglycemia.  Hypertension: She is unsure of what her blood pressures have been.  She is taking benazepril.  No chest pain or shortness of breath.  Slurred speech/weakness: This has been occurring intermittently over the last week to week and a half.  They are concerned she has had a stroke.  We did previously advise that she get evaluated though this did not occur.  Patient is dealing with some grief as her son did pass away recently.  She notes no SI.  She is unsure if she feels depressed.  Social History   Tobacco Use  Smoking Status Never  Smokeless Tobacco Never    Current Outpatient Medications on File Prior to Visit  Medication Sig Dispense Refill   acetaminophen (TYLENOL) 325 MG tablet Take 650 mg by mouth every 6 (six) hours as needed.     aspirin EC 81 MG tablet Take 81 mg by mouth daily.     BD AUTOSHIELD DUO 30G X 5 MM MISC USE TWICE A DAY. 60 each 10   benazepril (LOTENSIN) 5 MG tablet TAKE ONE TABLET EVERY DAY (Patient taking differently: Take 5 mg by mouth daily.) 90 tablet 2   calcium carbonate (OS-CAL) 600 MG TABS tablet Take one tablet by mouth once a day. (Patient taking differently: Take 500 mg by mouth daily. Oyst shell calcium 500 mg tab once a day) 30 tablet 10   Cholecalciferol (VITAMIN D3) 50 MCG (2000 UT) TABS Take 2,000 Units by mouth daily.     fenofibrate 160 MG tablet Take 1 tablet (160 mg total) by mouth daily. 90 tablet 1   HUMALOG MIX 75/25 KWIKPEN (75-25) 100 UNIT/ML KwikPen INJECT 14 UNITS SUBCUTANEOUSLY TWICE A DAY WITH A MEAL. 15 mL 10   hydrocortisone (ANUSOL-HC) 2.5 % rectal cream Place 1 Application rectally 2 (two) times daily. 30 g 1   Multiple Vitamin (MULTIVITAMIN WITH MINERALS) TABS tablet Take 1 tablet by mouth  daily.     NOVOFINE PLUS 32G X 4 MM MISC USE TWICE A DAY AS DIRECTED 100 each 6   pantoprazole (PROTONIX) 40 MG tablet TAKE 1 TABLET BY MOUTH EVERY-OTHER-DAY (Patient taking differently: Take 40 mg by mouth every other day.) 15 tablet 10   rosuvastatin (CRESTOR) 40 MG tablet TAKE (1) TABLET BY MOUTH ONCE A DAY (Patient taking differently: Take 40 mg by mouth daily.) 30 tablet 10   No current facility-administered medications on file prior to visit.     ROS see history of present illness  Objective  Physical Exam Vitals:   05/03/23 1020  BP: 114/76  Pulse: 81  Temp: 97.8 F (36.6 C)  SpO2: 98%    BP Readings from Last 3 Encounters:  05/03/23 114/76  12/05/22 (!) 165/78  10/26/22 120/64   Wt Readings from Last 3 Encounters:  05/03/23 119 lb 9.6 oz (54.3 kg)  12/05/22 117 lb (53.1 kg)  10/26/22 116 lb 9.6 oz (52.9 kg)    Physical Exam Constitutional:      General: She is not in acute distress.    Appearance: She is not diaphoretic.  Cardiovascular:     Rate and Rhythm: Normal rate and regular rhythm.     Heart sounds: Normal heart sounds.  Pulmonary:     Effort: Pulmonary  effort is normal.     Breath sounds: Normal breath sounds.  Skin:    General: Skin is warm and dry.  Neurological:     Mental Status: She is alert.     Comments: Patient is slurring her speech to a certain degree.  CN 3-12 intact, 5/5 strength in bilateral biceps, triceps, grip, quads, hamstrings, plantar and dorsiflexion, sensation to light touch intact in bilateral UE and LE      Assessment/Plan: Please see individual problem list.  Benign essential HTN Assessment & Plan: Chronic issue.  Adequately controlled at this time.  She will continue benazepril 5 mg daily.  Orders: -     Comprehensive metabolic panel -     TSH -     CBC -     Lipid panel  Type 2 diabetes mellitus with other diabetic kidney complication, with long-term current use of insulin (HCC) Assessment & Plan: Chronic  issue.  Undetermined control.  Check A1c.  Patient will continue 75/25 Humalog 14 units twice daily.  Orders: -     Hemoglobin A1c -     Comprehensive metabolic panel -     Lipid panel  Grief Assessment & Plan: Discussed that this is understandable given the loss of her son recently.  She will monitor.   Slurred speech Assessment & Plan: Patient with slurred speech and reported weakness.  She has slurred speech on exam today.  Otherwise neurological exam is reassuring.  Given that this has been going on for over a week we can obtain an outpatient MRI to start the workup.  Also checking lab work.  If she develops any worsening symptoms they will take her to the emergency department.  Orders: -     MR BRAIN WO CONTRAST; Future -     TSH -     CBC     Return in about 4 weeks (around 05/31/2023).   Marikay Alar, MD [facility]

## 2023-05-03 NOTE — Assessment & Plan Note (Signed)
Chronic issue.  Adequately controlled at this time.  She will continue benazepril 5 mg daily.

## 2023-05-07 ENCOUNTER — Telehealth: Payer: Self-pay

## 2023-05-07 DIAGNOSIS — D7589 Other specified diseases of blood and blood-forming organs: Secondary | ICD-10-CM

## 2023-05-07 NOTE — Telephone Encounter (Signed)
Left message for daughter Orlie Pollen to call back regarding the lab results below.

## 2023-05-07 NOTE — Telephone Encounter (Signed)
Pt daughter returned [facility] CMA call. Note below from provider was read to her. She aware and understood. She's agreeable for the referral. She stated she sees Dr. Orlie Dakin over at the cancer center for the same issue. So maybe Dr. Birdie Sons can sent that referral over to that office?

## 2023-05-07 NOTE — Telephone Encounter (Signed)
-----   Message from Marikay Alar sent at 05/07/2023  8:58 AM EDT ----- Please let the patient's daughter know her lab work did not reveal a cause for her symptoms.  Her red blood cells are slightly larger than they should be though this appears to be relatively stable.  Her platelets are lower than they have been.  Would they be willing to see hematology for that?  Her 1C has worsened slightly though is at goal for her age.  Her kidney function is generally stable.

## 2023-05-08 NOTE — Telephone Encounter (Signed)
Referral placed.

## 2023-05-08 NOTE — Addendum Note (Signed)
Addended by: Glori Luis on: 05/08/2023 10:52 AM   Modules accepted: Orders

## 2023-05-09 ENCOUNTER — Telehealth: Payer: Self-pay | Admitting: Family Medicine

## 2023-05-10 ENCOUNTER — Ambulatory Visit (INDEPENDENT_AMBULATORY_CARE_PROVIDER_SITE_OTHER): Payer: Medicare Other | Admitting: Nurse Practitioner

## 2023-05-10 ENCOUNTER — Telehealth: Payer: Self-pay | Admitting: Nurse Practitioner

## 2023-05-10 ENCOUNTER — Encounter: Payer: Self-pay | Admitting: Nurse Practitioner

## 2023-05-10 ENCOUNTER — Ambulatory Visit
Admission: RE | Admit: 2023-05-10 | Discharge: 2023-05-10 | Disposition: A | Discharge status: Home / Self Care | Payer: Medicare Other | Source: Ambulatory Visit | Attending: Nurse Practitioner

## 2023-05-10 VITALS — BP 108/66 | HR 71 | Temp 96.0°F | Ht 66.0 in | Wt 121.8 lb

## 2023-05-10 DIAGNOSIS — R4789 Other speech disturbances: Secondary | ICD-10-CM

## 2023-05-10 DIAGNOSIS — R443 Hallucinations, unspecified: Secondary | ICD-10-CM | POA: Diagnosis not present

## 2023-05-10 DIAGNOSIS — G238 Other specified degenerative diseases of basal ganglia: Secondary | ICD-10-CM | POA: Diagnosis not present

## 2023-05-10 DIAGNOSIS — W19XXXA Unspecified fall, initial encounter: Secondary | ICD-10-CM

## 2023-05-10 DIAGNOSIS — R296 Repeated falls: Secondary | ICD-10-CM | POA: Diagnosis not present

## 2023-05-10 LAB — POC URINALSYSI DIPSTICK (AUTOMATED)
Bilirubin, UA: NEGATIVE
Blood, UA: NEGATIVE
Glucose, UA: POSITIVE — AB
Ketones, UA: NEGATIVE
Leukocytes, UA: NEGATIVE
Nitrite, UA: NEGATIVE
Protein, UA: POSITIVE — AB
Spec Grav, UA: >=1.03 — AB (ref 1.010–1.025)
Urobilinogen, UA: 0.2 U/dL
pH, UA: 5.5 (ref 5.0–8.0)

## 2023-05-10 NOTE — Progress Notes (Unsigned)
Established Patient Office Visit  Subjective:  Patient ID: Laurie Harris, female    DOB: May 30, 1941  Age: 82 y.o. MRN: [id number]  CC:  Chief Complaint  Patient presents with   Acute Visit    Patient see things that are not there and Patient fell out of bed    HPI  Laurie Harris presents for acute visit. The daughter states that she  is having hallucination  from couple of weeks.  She has been seeing her dead husband and son.  Daughter mentioned a day or 2 before he did  see someone sleeping on her bed and people dancing around.   Slurred speech is getting better.  She is yet not back to normal the daughter states that her voice sounds more gravelly.  She had a unwitnessed fall yesterday while she was getting out of her bed. She fell on the carpet. Denise any headache, dizziness or bruising.  HPI   Past Medical History:  Diagnosis Date   Carotid arterial disease (HCC)    a. 09/2016 Carotid U/S: R carotid bifurcation dzs of 50-69%, L carotid bifurcation dzs of <50%. Patent vertebral arteries w/ anegrade flow.   Cerebellar hemorrhage (HCC) 03/16/2015   Left Cerebellar Hemorrhage- See Care Everywhere Seen by [facility]     Chicken pox    CKD (chronic kidney disease), stage III (HCC)    functioning at 46%   Diabetes mellitus without complication (HCC)    Hemorrhagic stroke (HCC) 12/31/2014   a. in the setting of warfarin therapy.   History of appendicitis 08/07/2016   Hyperlipidemia    Hypertension    Syncope    a. 09/2016 Echo: EF 55-60%, no rwma, Gr1 DD.    Past Surgical History:  Procedure Laterality Date   ABCESS DRAINAGE  06/14/2016   appendix   APPENDECTOMY  06/13/2016   CATARACT EXTRACTION W/PHACO Left 09/04/2017   Procedure: CATARACT EXTRACTION PHACO AND INTRAOCULAR LENS PLACEMENT (IOC) COMPLICATED LEFT DIABETIC;  Surgeon: Lockie Mola, MD;  Location: [facility];  Service: Ophthalmology;  Laterality: Left;  Diabetic - insulin   CATARACT  EXTRACTION W/PHACO Right 10/02/2017   Procedure: CATARACT EXTRACTION PHACO AND INTRAOCULAR LENS PLACEMENT (IOC) COMPLICATED  RIGHT DIABETIC;  Surgeon: Lockie Mola, MD;  Location: [facility];  Service: Ophthalmology;  Laterality: Right;  Diabetic - insulin   CHOLECYSTECTOMY N/A 06/17/2018   Procedure: LAPAROSCOPIC CHOLECYSTECTOMY;  Surgeon: Sung Amabile, DO;  Location: [facility];  Service: General;  Laterality: N/A;   COLONOSCOPY     LAPAROSCOPIC APPENDECTOMY N/A 06/08/2022   Procedure: APPENDECTOMY LAPAROSCOPIC;  Surgeon: Henrene Dodge, MD;  Location: [facility];  Service: General;  Laterality: N/A;   NECK SURGERY      Family History  Problem Relation Age of Onset   Diabetes Mother    Dementia Mother    Heart disease Mother    Cancer Mother        lung   Cancer Father        colon   Stroke Daughter    Hyperlipidemia Daughter    Breast cancer Neg Hx     Social History   Socioeconomic History   Marital status: Widowed    Spouse name: Not on file   Number of children: Not on file   Years of education: Not on file   Highest education level: 12th grade  Occupational History   Not on file  Tobacco Use   Smoking status: Never   Smokeless tobacco: Never  Vaping Use   Vaping status: Never Used  Substance and Sexual Activity   Alcohol use: No    Alcohol/week: 0.0 standard drinks of alcohol   Drug use: Never   Sexual activity: Not Currently  Other Topics Concern   Not on file  Social History Narrative   Not on file   Social Determinants of Health   Financial Resource Strain: Low Risk  (05/09/2023)   Overall Financial Resource Strain (CARDIA)    Difficulty of Paying Living Expenses: Not very hard  Food Insecurity: No Food Insecurity (05/09/2023)   Hunger Vital Sign    Worried About Running Out of Food in the Last Year: Never true    Ran Out of Food in the Last Year: Never true  Transportation Needs: No Transportation Needs (05/09/2023)   PRAPARE - Therapist, art (Medical): No    Lack of Transportation (Non-Medical): No  Physical Activity: Inactive (05/09/2023)   Exercise Vital Sign    Days of Exercise per Week: 0 days    Minutes of Exercise per Session: 20 min  Stress: Patient Declined (05/09/2023)   [employer] of Occupational Health - Occupational Stress Questionnaire    Feeling of Stress : Patient declined  Social Connections: Moderately Integrated (05/09/2023)   Social Connection and Isolation Panel [NHANES]    Frequency of Communication with Friends and Family: More than three times a week    Frequency of Social Gatherings with Friends and Family: Twice a week    Attends Religious Services: 1 to 4 times per year    Active Member of [employer] or Organizations: Yes    Attends Banker Meetings: 1 to 4 times per year    Marital Status: Widowed  Intimate Partner Violence: Not At Risk (08/08/2022)   Humiliation, Afraid, Rape, and Kick questionnaire    Fear of Current or Ex-Partner: No    Emotionally Abused: No    Physically Abused: No    Sexually Abused: No     Outpatient Medications Prior to Visit  Medication Sig Dispense Refill   acetaminophen (TYLENOL) 325 MG tablet Take 650 mg by mouth every 6 (six) hours as needed.     aspirin EC 81 MG tablet Take 81 mg by mouth daily.     BD AUTOSHIELD DUO 30G X 5 MM MISC USE TWICE A DAY. 60 each 10   benazepril (LOTENSIN) 5 MG tablet TAKE ONE TABLET EVERY DAY (Patient taking differently: Take 5 mg by mouth daily.) 90 tablet 2   calcium carbonate (OS-CAL) 600 MG TABS tablet Take one tablet by mouth once a day. (Patient taking differently: Take 500 mg by mouth daily. Oyst shell calcium 500 mg tab once a day) 30 tablet 10   Cholecalciferol (VITAMIN D3) 50 MCG (2000 UT) TABS Take 2,000 Units by mouth daily.     fenofibrate 160 MG tablet Take 1 tablet (160 mg total) by mouth daily. 90 tablet 1   HUMALOG MIX 75/25 KWIKPEN (75-25) 100 UNIT/ML KwikPen INJECT 14 UNITS  SUBCUTANEOUSLY TWICE A DAY WITH A MEAL. 15 mL 10   hydrocortisone (ANUSOL-HC) 2.5 % rectal cream Place 1 Application rectally 2 (two) times daily. 30 g 1   Multiple Vitamin (MULTIVITAMIN WITH MINERALS) TABS tablet Take 1 tablet by mouth daily.     NOVOFINE PLUS 32G X 4 MM MISC USE TWICE A DAY AS DIRECTED 100 each 6   pantoprazole (PROTONIX) 40 MG tablet TAKE 1 TABLET BY MOUTH EVERY-OTHER-DAY (Patient taking differently:  Take 40 mg by mouth every other day.) 15 tablet 10   rosuvastatin (CRESTOR) 40 MG tablet TAKE (1) TABLET BY MOUTH ONCE A DAY (Patient taking differently: Take 40 mg by mouth daily.) 30 tablet 10   No facility-administered medications prior to visit.    Allergies  Allergen Reactions   Warfarin And Related     Possible stroke   Warfarin     ROS Review of Systems Negative unless indicated in HPI.    Objective:    Physical Exam Constitutional:      Appearance: Normal appearance.  HENT:     Mouth/Throat:     Mouth: Mucous membranes are moist.  Eyes:     Conjunctiva/sclera: Conjunctivae normal.     Pupils: Pupils are equal, round, and reactive to light.  Cardiovascular:     Rate and Rhythm: Normal rate and regular rhythm.     Pulses: Normal pulses.     Heart sounds: Normal heart sounds.  Pulmonary:     Effort: Pulmonary effort is normal.     Breath sounds: Normal breath sounds.  Abdominal:     General: Bowel sounds are normal.     Palpations: Abdomen is soft.  Musculoskeletal:     Cervical back: Normal range of motion. No tenderness.  Skin:    General: Skin is warm.     Findings: No bruising.  Neurological:     General: No focal deficit present.     Mental Status: She is alert and oriented to person, place, and time. Mental status is at baseline.  Psychiatric:        Mood and Affect: Mood normal.        Behavior: Behavior normal.        Thought Content: Thought content normal.        Judgment: Judgment normal.     BP 108/66   Pulse 71   Temp (!)  96 F (35.6 C)   Ht 5\' 6"  (1.676 m)   Wt 121 lb 12.8 oz (55.2 kg)   SpO2 98%   BMI 19.66 kg/m  Wt Readings from Last 3 Encounters:  05/10/23 121 lb 12.8 oz (55.2 kg)  05/03/23 119 lb 9.6 oz (54.3 kg)  12/05/22 117 lb (53.1 kg)     Health Maintenance  Topic Date Due   Pneumonia Vaccine 8+ Years old (1 of 2 - PCV) Never done   Zoster Vaccines- Shingrix (1 of 2) Never done   FOOT EXAM  12/20/2022   COVID-19 Vaccine (5 - 2023-24 season) 05/26/2023 (Originally 05/05/2023)   Colonoscopy  08/09/2023 (Originally 07/20/2019)   INFLUENZA VACCINE  12/02/2023 (Originally 04/04/2023)   OPHTHALMOLOGY EXAM  07/31/2023   Medicare Annual Wellness (AWV)  08/09/2023   Diabetic kidney evaluation - Urine ACR  10/27/2023   HEMOGLOBIN A1C  11/01/2023   Diabetic kidney evaluation - eGFR measurement  05/02/2024   DTaP/Tdap/Td (2 - Td or Tdap) 08/22/2029   DEXA SCAN  Completed   HPV VACCINES  Aged Out    There are no preventive care reminders to display for this patient.  Lab Results  Component Value Date   TSH 2.08 05/03/2023   Lab Results  Component Value Date   WBC 4.5 05/03/2023   HGB 12.7 05/03/2023   HCT 38.1 05/03/2023   MCV 101.7 (H) 05/03/2023   PLT 118.0 (L) 05/03/2023   Lab Results  Component Value Date   NA 143 05/03/2023   K 4.3 05/03/2023   CO2 24 05/03/2023  GLUCOSE 183 (H) 05/03/2023   BUN 29 (H) 05/03/2023   CREATININE 1.12 05/03/2023   BILITOT 0.5 05/03/2023   ALKPHOS 107 05/03/2023   AST 37 05/03/2023   ALT 33 05/03/2023   PROT 6.1 05/03/2023   ALBUMIN 3.7 05/03/2023   CALCIUM 9.3 05/03/2023   ANIONGAP 8 09/25/2022   EGFR 67 07/20/2022   GFR 45.14 (L) 05/03/2023   Lab Results  Component Value Date   CHOL 119 05/03/2023   Lab Results  Component Value Date   HDL 41.10 05/03/2023   Lab Results  Component Value Date   LDLCALC 41 05/03/2023   Lab Results  Component Value Date   TRIG 183.0 (H) 05/03/2023   Lab Results  Component Value Date   CHOLHDL  3 05/03/2023   Lab Results  Component Value Date   HGBA1C 7.2 (H) 05/03/2023      Assessment & Plan:  Hallucination Assessment & Plan: POCT urinalysis shows no sign of infection. Given her symptoms we will refer to neurology.  Orders: -     POCT Urinalysis Dipstick (Automated) -     Ambulatory referral to Neurology  Other speech disturbance -     POCT Urinalysis Dipstick (Automated) -     Ambulatory referral to Neurology  Unwitnessed fall Assessment & Plan: Patient unwitnessed fall yesterday on the carpet floor. Stat CT ordered to rule out TBI.    Orders: -     CT HEAD WO CONTRAST ( ); Future    Follow-up: Return if symptoms worsen or fail to improve.   Kara Dies, NP

## 2023-05-12 DIAGNOSIS — R443 Hallucinations, unspecified: Secondary | ICD-10-CM | POA: Insufficient documentation

## 2023-05-12 NOTE — Telephone Encounter (Signed)
Late entry. Called the daughter Ms. Lynn on 05/10/2023 at 7:30 PM and discussed the CT result. No acute finding.  She verbalized understanding.  All questions answered.

## 2023-05-12 NOTE — Assessment & Plan Note (Signed)
POCT urinalysis shows no sign of infection. Given her symptoms we will refer to neurology.

## 2023-05-12 NOTE — Assessment & Plan Note (Signed)
Patient unwitnessed fall yesterday on the carpet floor. Stat CT ordered to rule out TBI.

## 2023-05-16 ENCOUNTER — Encounter: Payer: Self-pay | Admitting: Oncology

## 2023-05-16 ENCOUNTER — Inpatient Hospital Stay: Payer: Medicare Other | Attending: Oncology | Admitting: Oncology

## 2023-05-16 ENCOUNTER — Inpatient Hospital Stay: Payer: Medicare Other

## 2023-05-16 VITALS — BP 138/71 | HR 75 | Temp 95.8°F | Resp 16 | Ht 66.0 in | Wt 123.0 lb

## 2023-05-16 DIAGNOSIS — Z7982 Long term (current) use of aspirin: Secondary | ICD-10-CM | POA: Insufficient documentation

## 2023-05-16 DIAGNOSIS — D649 Anemia, unspecified: Secondary | ICD-10-CM | POA: Diagnosis not present

## 2023-05-16 DIAGNOSIS — D696 Thrombocytopenia, unspecified: Secondary | ICD-10-CM | POA: Diagnosis not present

## 2023-05-16 DIAGNOSIS — Z79899 Other long term (current) drug therapy: Secondary | ICD-10-CM | POA: Diagnosis not present

## 2023-05-16 LAB — CBC (CANCER CENTER ONLY)
HCT: 35.4 % — ABNORMAL LOW (ref 36.0–46.0)
Hemoglobin: 11.9 g/dL — ABNORMAL LOW (ref 12.0–15.0)
MCH: 33.6 pg (ref 26.0–34.0)
MCHC: 33.6 g/dL (ref 30.0–36.0)
MCV: 100 fL (ref 80.0–100.0)
Platelet Count: 102 10*3/uL — ABNORMAL LOW (ref 150–400)
RBC: 3.54 MIL/uL — ABNORMAL LOW (ref 3.87–5.11)
RDW: 14.5 % (ref 11.5–15.5)
WBC Count: 6.2 10*3/uL (ref 4.0–10.5)
nRBC: 0 % (ref 0.0–0.2)

## 2023-05-16 LAB — VITAMIN B12: Vitamin B-12: 342 pg/mL (ref 180–914)

## 2023-05-16 LAB — FOLATE: Folate: 19 ng/mL (ref >5.9)

## 2023-05-16 LAB — IRON AND TIBC
Iron: 133 ug/dL (ref 28–170)
Saturation Ratios: 29 % (ref 10.4–31.8)
TIBC: 462 ug/dL — ABNORMAL HIGH (ref 250–450)
UIBC: 329 ug/dL

## 2023-05-16 LAB — LACTATE DEHYDROGENASE: LDH: 176 U/L (ref 98–192)

## 2023-05-16 LAB — FERRITIN: Ferritin: 130 ng/mL (ref 11–307)

## 2023-05-16 NOTE — Progress Notes (Signed)
[facility]  [phone] [phone]  ID: Laurie Harris OB: 1940-09-07  MR#: [id number]  WNI#:[id number]  Patient Care Team: Glori Luis, MD as PCP - General (Family Medicine) Iran Ouch, MD as PCP - Cardiology (Cardiology) Laurier Nancy, MD (Cardiology)  CHIEF COMPLAINT: Thrombocytopenia, macrocytosis.  INTERVAL HISTORY: Patient is an 82 year old female who was noted to have a mildly decreased platelet count as well as microcytosis without anemia on routine blood work.  She currently feels well and is at her baseline.  She is accompanied with her daughter.  She has no new neurologic complaints.  She denies any recent fevers or illnesses.  She has a good appetite and denies weight loss.  She has no new medications.  She has no chest pain, shortness of breath, cough, or hemoptysis.  She denies any nausea, vomiting, constipation, or diarrhea.  She has no urinary complaints.  Patient offers no further specific complaints today.   REVIEW OF SYSTEMS:   Review of Systems  Constitutional: Negative.  Negative for fever, malaise/fatigue and weight loss.  Respiratory: Negative.  Negative for cough, hemoptysis and shortness of breath.   Cardiovascular: Negative.  Negative for chest pain and leg swelling.  Gastrointestinal: Negative.  Negative for abdominal pain.  Genitourinary: Negative.  Negative for dysuria.  Musculoskeletal: Negative.   Skin: Negative.  Negative for rash.  Neurological: Negative.  Negative for dizziness, focal weakness, weakness and headaches.  Psychiatric/Behavioral: Negative.  The patient is not nervous/anxious.     As per HPI. Otherwise, a complete review of systems is negative.  PAST MEDICAL HISTORY: Past Medical History:  Diagnosis Date   Carotid arterial disease (HCC)    a. 09/2016 Carotid U/S: R carotid bifurcation dzs of 50-69%, L carotid bifurcation dzs of <50%. Patent vertebral arteries w/ anegrade flow.    Cerebellar hemorrhage (HCC) 03/16/2015   Left Cerebellar Hemorrhage- See Care Everywhere Seen by [facility]     Chicken pox    CKD (chronic kidney disease), stage III (HCC)    functioning at 46%   Diabetes mellitus without complication (HCC)    Hemorrhagic stroke (HCC) 12/31/2014   a. in the setting of warfarin therapy.   History of appendicitis 08/07/2016   Hyperlipidemia    Hypertension    Syncope    a. 09/2016 Echo: EF 55-60%, no rwma, Gr1 DD.    PAST SURGICAL HISTORY: Past Surgical History:  Procedure Laterality Date   ABCESS DRAINAGE  06/14/2016   appendix   APPENDECTOMY  06/13/2016   CATARACT EXTRACTION W/PHACO Left 09/04/2017   Procedure: CATARACT EXTRACTION PHACO AND INTRAOCULAR LENS PLACEMENT (IOC) COMPLICATED LEFT DIABETIC;  Surgeon: Lockie Mola, MD;  Location: [facility];  Service: Ophthalmology;  Laterality: Left;  Diabetic - insulin   CATARACT EXTRACTION W/PHACO Right 10/02/2017   Procedure: CATARACT EXTRACTION PHACO AND INTRAOCULAR LENS PLACEMENT (IOC) COMPLICATED  RIGHT DIABETIC;  Surgeon: Lockie Mola, MD;  Location: [facility];  Service: Ophthalmology;  Laterality: Right;  Diabetic - insulin   CHOLECYSTECTOMY N/A 06/17/2018   Procedure: LAPAROSCOPIC CHOLECYSTECTOMY;  Surgeon: Sung Amabile, DO;  Location: [facility];  Service: General;  Laterality: N/A;   COLONOSCOPY     LAPAROSCOPIC APPENDECTOMY N/A 06/08/2022   Procedure: APPENDECTOMY LAPAROSCOPIC;  Surgeon: Henrene Dodge, MD;  Location: [facility];  Service: General;  Laterality: N/A;   NECK SURGERY      FAMILY HISTORY: Family History  Problem Relation Age of Onset   Diabetes Mother    Dementia  Mother    Heart disease Mother    Cancer Mother        lung   Cancer Father        colon   Stroke Father    Stroke Daughter    Hyperlipidemia Daughter    Breast cancer Neg Hx     ADVANCED DIRECTIVES (Y/N):  N  HEALTH MAINTENANCE: Social History   Tobacco Use   Smoking  status: Never   Smokeless tobacco: Never  Vaping Use   Vaping status: Never Used  Substance Use Topics   Alcohol use: Never   Drug use: Never     Colonoscopy:  PAP:  Bone density:  Lipid panel:  Allergies  Allergen Reactions   Warfarin And Related     Possible stroke   Warfarin     Current Outpatient Medications  Medication Sig Dispense Refill   acetaminophen (TYLENOL) 325 MG tablet Take 650 mg by mouth every 6 (six) hours as needed.     aspirin EC 81 MG tablet Take 81 mg by mouth daily.     BD AUTOSHIELD DUO 30G X 5 MM MISC USE TWICE A DAY. 60 each 10   benazepril (LOTENSIN) 5 MG tablet TAKE ONE TABLET EVERY DAY (Patient taking differently: Take 5 mg by mouth daily.) 90 tablet 2   calcium carbonate (OS-CAL) 600 MG TABS tablet Take one tablet by mouth once a day. (Patient taking differently: Take 500 mg by mouth daily. Oyst shell calcium 500 mg tab once a day) 30 tablet 10   Cholecalciferol (VITAMIN D3) 50 MCG (2000 UT) TABS Take 2,000 Units by mouth daily.     fenofibrate 160 MG tablet Take 1 tablet (160 mg total) by mouth daily. 90 tablet 1   HUMALOG MIX 75/25 KWIKPEN (75-25) 100 UNIT/ML KwikPen INJECT 14 UNITS SUBCUTANEOUSLY TWICE A DAY WITH A MEAL. 15 mL 10   hydrocortisone (ANUSOL-HC) 2.5 % rectal cream Place 1 Application rectally 2 (two) times daily. 30 g 1   Multiple Vitamin (MULTIVITAMIN WITH MINERALS) TABS tablet Take 1 tablet by mouth daily.     NOVOFINE PLUS 32G X 4 MM MISC USE TWICE A DAY AS DIRECTED 100 each 6   pantoprazole (PROTONIX) 40 MG tablet TAKE 1 TABLET BY MOUTH EVERY-OTHER-DAY (Patient taking differently: Take 40 mg by mouth every other day.) 15 tablet 10   rosuvastatin (CRESTOR) 40 MG tablet TAKE (1) TABLET BY MOUTH ONCE A DAY (Patient taking differently: Take 40 mg by mouth daily.) 30 tablet 10   No current facility-administered medications for this visit.    OBJECTIVE: Vitals:   05/16/23 1123  BP: 138/71  Pulse: 75  Resp: 16  Temp: (!) 95.8 F  (35.4 C)  SpO2: 100%     Body mass index is 19.85 kg/m.    ECOG FS:1 - Symptomatic but completely ambulatory  General: Well-developed, well-nourished, no acute distress.  Sitting in a wheelchair. Eyes: Pink conjunctiva, anicteric sclera. HEENT: Normocephalic, moist mucous membranes. Lungs: No audible wheezing or coughing. Heart: Regular rate and rhythm. Abdomen: Soft, nontender, no obvious distention. Musculoskeletal: No edema, cyanosis, or clubbing. Neuro: Alert, answering all questions appropriately. Cranial nerves grossly intact. Skin: No rashes or petechiae noted. Psych: Normal affect.  LAB RESULTS:  Lab Results  Component Value Date   NA 143 05/03/2023   K 4.3 05/03/2023   CL 108 05/03/2023   CO2 24 05/03/2023   GLUCOSE 183 (H) 05/03/2023   BUN 29 (H) 05/03/2023   CREATININE 1.12 05/03/2023  CALCIUM 9.3 05/03/2023   PROT 6.1 05/03/2023   ALBUMIN 3.7 05/03/2023   AST 37 05/03/2023   ALT 33 05/03/2023   ALKPHOS 107 05/03/2023   BILITOT 0.5 05/03/2023   GFRNONAA 53 (L) 09/25/2022   GFRAA 48 (L) 12/08/2019    Lab Results  Component Value Date   WBC 6.2 05/16/2023   NEUTROABS 4,466 07/20/2022   HGB 11.9 (L) 05/16/2023   HCT 35.4 (L) 05/16/2023   MCV 100.0 05/16/2023   PLT 102 (L) 05/16/2023     STUDIES: CT HEAD WO CONTRAST ( )  Result Date: 05/10/2023 CLINICAL DATA:  fall EXAM: CT HEAD WITHOUT CONTRAST TECHNIQUE: Contiguous axial images were obtained from the base of the skull through the vertex without intravenous contrast. RADIATION DOSE REDUCTION: This exam was performed according to the departmental dose-optimization program which includes automated exposure control, adjustment of the mA and/or kV according to patient size and/or use of iterative reconstruction technique. COMPARISON:  Brain MR 05/03/2023 FINDINGS: Brain: No evidence of acute infarction, hemorrhage, hydrocephalus, extra-axial collection or mass lesion/mass effect. Mineralization of the basal  ganglia bilaterally. Vascular: No hyperdense vessel or unexpected calcification. Skull: Normal. Negative for fracture or focal lesion. Sinuses/Orbits: No middle ear or mastoid effusion. Paranasal sinuses are clear. Bilateral lens replacement. Orbits are otherwise unremarkable. Other: None. IMPRESSION: No acute intracranial abnormality. Electronically Signed   By: Lorenza Cambridge M.D.   On: 05/10/2023 17:22   MR Brain Wo Contrast  Result Date: 05/03/2023 CLINICAL DATA:  Neuro deficit, acute, stroke suspected EXAM: MRI HEAD WITHOUT CONTRAST TECHNIQUE: Multiplanar, multiecho pulse sequences of the brain and surrounding structures were obtained without intravenous contrast. COMPARISON:  MRI head 09/17/2019. FINDINGS: Brain: No acute infarction, acute hemorrhage, hydrocephalus, extra-axial collection or mass lesion. Small foci susceptibility artifact in the left cerebellum and brainstem, compatible with prior microhemorrhages. Cerebral atrophy. Vascular: Major arterial flow voids are maintained at the skull base. Skull and upper cervical spine: Normal marrow signal. Sinuses/Orbits: Clear sinuses.  No acute orbital findings. Other: No mastoid effusions. IMPRESSION: No evidence of acute intracranial abnormality. Electronically Signed   By: Feliberto Harts M.D.   On: 05/03/2023 12:29    ASSESSMENT: Thrombocytopenia, macrocytosis.  PLAN:    Thrombocytopenia: Patient continues to have a mildly decreased platelet count that is trended down slightly to 102.  All of her other laboratory work is pending at time of dictation.  No intervention is needed at this time.  Patient does not require bone marrow biopsy.  Patient has been instructed to continue aspirin unless her platelet count falls below 50.  She will have video-assisted telemedicine visit in 3 weeks to discuss her results. Anemia: Mild.  Patient's hemoglobin is 11.9 today.  Laboratory work pending as above. Macrocytosis: Appears to have resolved.  Patient's  MCV is 100.  I spent a total of 45 minutes reviewing chart data, face-to-face evaluation with the patient, counseling and coordination of care as detailed above.  Patient expressed understanding and was in agreement with this plan. She also understands that She can call clinic at any time with any questions, concerns, or complaints.    Jeralyn Ruths, MD   05/16/2023 1:16 PM

## 2023-05-17 DIAGNOSIS — E1059 Type 1 diabetes mellitus with other circulatory complications: Secondary | ICD-10-CM | POA: Diagnosis not present

## 2023-05-17 LAB — PLATELET ANTIBODY PROFILE
Glycoprotein IV Antibody: NEGATIVE
HLA Ab Ser Ql EIA: POSITIVE — AB
IA/IIA Antibody: NEGATIVE
IB/IX Antibody: NEGATIVE
IIB/IIIA Antibody: POSITIVE — AB

## 2023-05-17 LAB — HAPTOGLOBIN: Haptoglobin: 44 mg/dL (ref 41–333)

## 2023-05-19 LAB — PROTEIN ELECTROPHORESIS, SERUM
A/G Ratio: 1.1 (ref 0.7–1.7)
Albumin ELP: 3.3 g/dL (ref 2.9–4.4)
Alpha-1-Globulin: 0.4 g/dL (ref 0.0–0.4)
Alpha-2-Globulin: 0.6 g/dL (ref 0.4–1.0)
Beta Globulin: 1.1 g/dL (ref 0.7–1.3)
Gamma Globulin: 0.8 g/dL (ref 0.4–1.8)
Globulin, Total: 2.9 g/dL (ref 2.2–3.9)
Total Protein ELP: 6.2 g/dL (ref 6.0–8.5)

## 2023-05-23 ENCOUNTER — Observation Stay (HOSPITAL_COMMUNITY)
Admission: EM | Admit: 2023-05-23 | Discharge: 2023-05-24 | Disposition: A | Discharge status: Home Health | Payer: Medicare Other | Source: Home / Self Care | Attending: Osteopathic Medicine | Admitting: Osteopathic Medicine

## 2023-05-23 ENCOUNTER — Emergency Department: Payer: Medicare Other

## 2023-05-23 ENCOUNTER — Encounter: Payer: Self-pay | Admitting: Intensive Care

## 2023-05-23 ENCOUNTER — Other Ambulatory Visit: Payer: Self-pay

## 2023-05-23 DIAGNOSIS — Z1152 Encounter for screening for COVID-19: Secondary | ICD-10-CM | POA: Diagnosis not present

## 2023-05-23 DIAGNOSIS — F4320 Adjustment disorder, unspecified: Secondary | ICD-10-CM | POA: Diagnosis present

## 2023-05-23 DIAGNOSIS — E7800 Pure hypercholesterolemia, unspecified: Secondary | ICD-10-CM | POA: Diagnosis not present

## 2023-05-23 DIAGNOSIS — R404 Transient alteration of awareness: Secondary | ICD-10-CM

## 2023-05-23 DIAGNOSIS — F0394 Unspecified dementia, unspecified severity, with anxiety: Secondary | ICD-10-CM | POA: Diagnosis present

## 2023-05-23 DIAGNOSIS — W19XXXA Unspecified fall, initial encounter: Secondary | ICD-10-CM | POA: Diagnosis not present

## 2023-05-23 DIAGNOSIS — I129 Hypertensive chronic kidney disease with stage 1 through stage 4 chronic kidney disease, or unspecified chronic kidney disease: Secondary | ICD-10-CM | POA: Insufficient documentation

## 2023-05-23 DIAGNOSIS — R001 Bradycardia, unspecified: Secondary | ICD-10-CM | POA: Diagnosis not present

## 2023-05-23 DIAGNOSIS — I614 Nontraumatic intracerebral hemorrhage in cerebellum: Secondary | ICD-10-CM | POA: Diagnosis not present

## 2023-05-23 DIAGNOSIS — I10 Essential (primary) hypertension: Secondary | ICD-10-CM | POA: Diagnosis not present

## 2023-05-23 DIAGNOSIS — E118 Type 2 diabetes mellitus with unspecified complications: Secondary | ICD-10-CM | POA: Diagnosis not present

## 2023-05-23 DIAGNOSIS — R569 Unspecified convulsions: Secondary | ICD-10-CM | POA: Diagnosis not present

## 2023-05-23 DIAGNOSIS — R441 Visual hallucinations: Secondary | ICD-10-CM | POA: Insufficient documentation

## 2023-05-23 DIAGNOSIS — R471 Dysarthria and anarthria: Secondary | ICD-10-CM | POA: Diagnosis present

## 2023-05-23 DIAGNOSIS — R9082 White matter disease, unspecified: Secondary | ICD-10-CM | POA: Diagnosis not present

## 2023-05-23 DIAGNOSIS — R4182 Altered mental status, unspecified: Principal | ICD-10-CM

## 2023-05-23 DIAGNOSIS — I6782 Cerebral ischemia: Secondary | ICD-10-CM | POA: Diagnosis not present

## 2023-05-23 DIAGNOSIS — R000 Tachycardia, unspecified: Secondary | ICD-10-CM | POA: Diagnosis not present

## 2023-05-23 DIAGNOSIS — I2510 Atherosclerotic heart disease of native coronary artery without angina pectoris: Secondary | ICD-10-CM | POA: Diagnosis present

## 2023-05-23 DIAGNOSIS — I5A Non-ischemic myocardial injury (non-traumatic): Secondary | ICD-10-CM | POA: Diagnosis not present

## 2023-05-23 DIAGNOSIS — Z794 Long term (current) use of insulin: Secondary | ICD-10-CM | POA: Diagnosis not present

## 2023-05-23 DIAGNOSIS — Z136 Encounter for screening for cardiovascular disorders: Secondary | ICD-10-CM | POA: Diagnosis not present

## 2023-05-23 DIAGNOSIS — E860 Dehydration: Secondary | ICD-10-CM | POA: Diagnosis present

## 2023-05-23 DIAGNOSIS — R4781 Slurred speech: Secondary | ICD-10-CM | POA: Diagnosis not present

## 2023-05-23 DIAGNOSIS — G238 Other specified degenerative diseases of basal ganglia: Secondary | ICD-10-CM | POA: Diagnosis not present

## 2023-05-23 DIAGNOSIS — Z66 Do not resuscitate: Secondary | ICD-10-CM | POA: Diagnosis present

## 2023-05-23 DIAGNOSIS — I6789 Other cerebrovascular disease: Secondary | ICD-10-CM | POA: Diagnosis not present

## 2023-05-23 DIAGNOSIS — F4321 Adjustment disorder with depressed mood: Secondary | ICD-10-CM | POA: Diagnosis present

## 2023-05-23 DIAGNOSIS — R1310 Dysphagia, unspecified: Secondary | ICD-10-CM | POA: Diagnosis present

## 2023-05-23 DIAGNOSIS — E872 Acidosis, unspecified: Secondary | ICD-10-CM | POA: Diagnosis present

## 2023-05-23 DIAGNOSIS — F0393 Unspecified dementia, unspecified severity, with mood disturbance: Secondary | ICD-10-CM | POA: Diagnosis present

## 2023-05-23 DIAGNOSIS — T68XXXA Hypothermia, initial encounter: Secondary | ICD-10-CM | POA: Diagnosis not present

## 2023-05-23 DIAGNOSIS — R531 Weakness: Secondary | ICD-10-CM | POA: Diagnosis not present

## 2023-05-23 DIAGNOSIS — Z7982 Long term (current) use of aspirin: Secondary | ICD-10-CM | POA: Insufficient documentation

## 2023-05-23 DIAGNOSIS — E785 Hyperlipidemia, unspecified: Secondary | ICD-10-CM | POA: Diagnosis not present

## 2023-05-23 DIAGNOSIS — F01A18 Vascular dementia, mild, with other behavioral disturbance: Secondary | ICD-10-CM | POA: Diagnosis not present

## 2023-05-23 DIAGNOSIS — Z7401 Bed confinement status: Secondary | ICD-10-CM | POA: Diagnosis not present

## 2023-05-23 DIAGNOSIS — F0392 Unspecified dementia, unspecified severity, with psychotic disturbance: Secondary | ICD-10-CM | POA: Diagnosis present

## 2023-05-23 DIAGNOSIS — R6510 Systemic inflammatory response syndrome (SIRS) of non-infectious origin without acute organ dysfunction: Secondary | ICD-10-CM | POA: Diagnosis not present

## 2023-05-23 DIAGNOSIS — R296 Repeated falls: Secondary | ICD-10-CM | POA: Diagnosis not present

## 2023-05-23 DIAGNOSIS — R0902 Hypoxemia: Secondary | ICD-10-CM | POA: Diagnosis not present

## 2023-05-23 DIAGNOSIS — I672 Cerebral atherosclerosis: Secondary | ICD-10-CM | POA: Diagnosis not present

## 2023-05-23 DIAGNOSIS — R29818 Other symptoms and signs involving the nervous system: Secondary | ICD-10-CM | POA: Diagnosis not present

## 2023-05-23 DIAGNOSIS — E1129 Type 2 diabetes mellitus with other diabetic kidney complication: Secondary | ICD-10-CM | POA: Diagnosis not present

## 2023-05-23 DIAGNOSIS — M160 Bilateral primary osteoarthritis of hip: Secondary | ICD-10-CM | POA: Diagnosis not present

## 2023-05-23 DIAGNOSIS — R410 Disorientation, unspecified: Secondary | ICD-10-CM | POA: Diagnosis not present

## 2023-05-23 DIAGNOSIS — E1122 Type 2 diabetes mellitus with diabetic chronic kidney disease: Secondary | ICD-10-CM | POA: Diagnosis not present

## 2023-05-23 DIAGNOSIS — E1121 Type 2 diabetes mellitus with diabetic nephropathy: Secondary | ICD-10-CM | POA: Diagnosis not present

## 2023-05-23 DIAGNOSIS — F05 Delirium due to known physiological condition: Secondary | ICD-10-CM | POA: Diagnosis not present

## 2023-05-23 DIAGNOSIS — G9341 Metabolic encephalopathy: Secondary | ICD-10-CM | POA: Diagnosis not present

## 2023-05-23 DIAGNOSIS — I69354 Hemiplegia and hemiparesis following cerebral infarction affecting left non-dominant side: Secondary | ICD-10-CM | POA: Diagnosis not present

## 2023-05-23 DIAGNOSIS — S0003XA Contusion of scalp, initial encounter: Secondary | ICD-10-CM | POA: Diagnosis not present

## 2023-05-23 DIAGNOSIS — R680 Hypothermia, not associated with low environmental temperature: Secondary | ICD-10-CM | POA: Diagnosis present

## 2023-05-23 DIAGNOSIS — R29810 Facial weakness: Secondary | ICD-10-CM | POA: Diagnosis not present

## 2023-05-23 DIAGNOSIS — Z515 Encounter for palliative care: Secondary | ICD-10-CM | POA: Diagnosis not present

## 2023-05-23 DIAGNOSIS — I619 Nontraumatic intracerebral hemorrhage, unspecified: Secondary | ICD-10-CM | POA: Diagnosis not present

## 2023-05-23 DIAGNOSIS — A419 Sepsis, unspecified organism: Secondary | ICD-10-CM | POA: Diagnosis not present

## 2023-05-23 DIAGNOSIS — I214 Non-ST elevation (NSTEMI) myocardial infarction: Secondary | ICD-10-CM | POA: Diagnosis not present

## 2023-05-23 DIAGNOSIS — N1831 Chronic kidney disease, stage 3a: Secondary | ICD-10-CM | POA: Diagnosis not present

## 2023-05-23 DIAGNOSIS — I6521 Occlusion and stenosis of right carotid artery: Secondary | ICD-10-CM | POA: Diagnosis not present

## 2023-05-23 DIAGNOSIS — D696 Thrombocytopenia, unspecified: Secondary | ICD-10-CM | POA: Diagnosis not present

## 2023-05-23 DIAGNOSIS — I2489 Other forms of acute ischemic heart disease: Secondary | ICD-10-CM | POA: Diagnosis not present

## 2023-05-23 DIAGNOSIS — Z79899 Other long term (current) drug therapy: Secondary | ICD-10-CM | POA: Insufficient documentation

## 2023-05-23 DIAGNOSIS — G9340 Encephalopathy, unspecified: Secondary | ICD-10-CM | POA: Diagnosis not present

## 2023-05-23 DIAGNOSIS — I6522 Occlusion and stenosis of left carotid artery: Secondary | ICD-10-CM | POA: Diagnosis not present

## 2023-05-23 LAB — URINALYSIS, ROUTINE W REFLEX MICROSCOPIC
Bilirubin Urine: NEGATIVE
Glucose, UA: NEGATIVE mg/dL
Ketones, ur: NEGATIVE mg/dL
Leukocytes,Ua: NEGATIVE
Nitrite: NEGATIVE
Protein, ur: NEGATIVE mg/dL
Specific Gravity, Urine: 1.017 (ref 1.005–1.030)
pH: 5 (ref 5.0–8.0)

## 2023-05-23 LAB — CBC
HCT: 34.7 % — ABNORMAL LOW (ref 36.0–46.0)
Hemoglobin: 12.3 g/dL (ref 12.0–15.0)
MCH: 36.3 pg — ABNORMAL HIGH (ref 26.0–34.0)
MCHC: 35.4 g/dL (ref 30.0–36.0)
MCV: 102.4 fL — ABNORMAL HIGH (ref 80.0–100.0)
Platelets: 107 10*3/uL — ABNORMAL LOW (ref 150–400)
RBC: 3.39 MIL/uL — ABNORMAL LOW (ref 3.87–5.11)
RDW: 15.2 % (ref 11.5–15.5)
WBC: 4.5 10*3/uL (ref 4.0–10.5)
nRBC: 0 % (ref 0.0–0.2)

## 2023-05-23 LAB — COMPREHENSIVE METABOLIC PANEL
ALT: 48 U/L — ABNORMAL HIGH (ref 0–44)
AST: 53 U/L — ABNORMAL HIGH (ref 15–41)
Albumin: 3.4 g/dL — ABNORMAL LOW (ref 3.5–5.0)
Alkaline Phosphatase: 135 U/L — ABNORMAL HIGH (ref 38–126)
Anion gap: 9 (ref 5–15)
BUN: 31 mg/dL — ABNORMAL HIGH (ref 8–23)
CO2: 23 mmol/L (ref 22–32)
Calcium: 9.5 mg/dL (ref 8.9–10.3)
Chloride: 110 mmol/L (ref 98–111)
Creatinine, Ser: 1.01 mg/dL — ABNORMAL HIGH (ref 0.44–1.00)
GFR, Estimated: 55 mL/min — ABNORMAL LOW (ref >60)
Glucose, Bld: 179 mg/dL — ABNORMAL HIGH (ref 70–99)
Potassium: 3.6 mmol/L (ref 3.5–5.1)
Sodium: 142 mmol/L (ref 135–145)
Total Bilirubin: 0.9 mg/dL (ref 0.3–1.2)
Total Protein: 7.1 g/dL (ref 6.5–8.1)

## 2023-05-23 LAB — DIFFERENTIAL
Abs Immature Granulocytes: 0.03 10*3/uL (ref 0.00–0.07)
Basophils Absolute: 0 10*3/uL (ref 0.0–0.1)
Basophils Relative: 1 %
Eosinophils Absolute: 0.1 10*3/uL (ref 0.0–0.5)
Eosinophils Relative: 1 %
Immature Granulocytes: 1 %
Lymphocytes Relative: 22 %
Lymphs Abs: 1 10*3/uL (ref 0.7–4.0)
Monocytes Absolute: 0.3 10*3/uL (ref 0.1–1.0)
Monocytes Relative: 7 %
Neutro Abs: 3.1 10*3/uL (ref 1.7–7.7)
Neutrophils Relative %: 68 %

## 2023-05-23 LAB — URINE DRUG SCREEN, QUALITATIVE (ARMC ONLY)
Amphetamines, Ur Screen: NOT DETECTED
Barbiturates, Ur Screen: NOT DETECTED
Benzodiazepine, Ur Scrn: NOT DETECTED
Cannabinoid 50 Ng, Ur ~~LOC~~: NOT DETECTED
Cocaine Metabolite,Ur ~~LOC~~: NOT DETECTED
MDMA (Ecstasy)Ur Screen: POSITIVE — AB
Methadone Scn, Ur: NOT DETECTED
Opiate, Ur Screen: NOT DETECTED
Phencyclidine (PCP) Ur S: NOT DETECTED
Tricyclic, Ur Screen: NOT DETECTED

## 2023-05-23 LAB — PROTIME-INR
INR: 1.1 (ref 0.8–1.2)
Prothrombin Time: 14.7 seconds (ref 11.4–15.2)

## 2023-05-23 LAB — CBG MONITORING, ED
Glucose-Capillary: 160 mg/dL — ABNORMAL HIGH (ref 70–99)
Glucose-Capillary: 86 mg/dL (ref 70–99)
Glucose-Capillary: 217 mg/dL — ABNORMAL HIGH (ref 70–99)

## 2023-05-23 LAB — ETHANOL: Alcohol, Ethyl (B): <10 mg/dL (ref <10)

## 2023-05-23 LAB — TSH: TSH: 6.15 u[IU]/mL — ABNORMAL HIGH (ref 0.350–4.500)

## 2023-05-23 LAB — APTT: aPTT: 34 seconds (ref 24–36)

## 2023-05-23 LAB — SARS CORONAVIRUS 2 BY RT PCR: SARS Coronavirus 2 by RT PCR: NEGATIVE

## 2023-05-23 MED ORDER — ACETAMINOPHEN 325 MG PO TABS: 650.0000 mg | ORAL_TABLET | Freq: Four times a day (QID) | ORAL | Status: DC | PRN

## 2023-05-23 MED ORDER — SODIUM CHLORIDE 0.9 % IV SOLN: Freq: Once | INTRAVENOUS | Status: AC

## 2023-05-23 MED ORDER — ONDANSETRON HCL 4 MG/2ML IJ SOLN: 4.0000 mg | Freq: Four times a day (QID) | INTRAMUSCULAR | Status: DC | PRN

## 2023-05-23 MED ORDER — PANTOPRAZOLE SODIUM 40 MG PO TBEC
40.0000 mg | DELAYED_RELEASE_TABLET | ORAL | Status: DC
  Filled 2023-05-23: qty 1

## 2023-05-23 MED ORDER — INSULIN ASPART 100 UNIT/ML IJ SOLN
0.0000 [IU] | Freq: Three times a day (TID) | INTRAMUSCULAR | Status: DC
  Administered 2023-05-24: 2 [IU] via SUBCUTANEOUS
  Filled 2023-05-23 (×3): qty 1

## 2023-05-23 MED ORDER — ASPIRIN 81 MG PO TBEC
81.0000 mg | DELAYED_RELEASE_TABLET | Freq: Every day | ORAL | Status: DC
  Administered 2023-05-23: 81 mg via ORAL
  Filled 2023-05-23 (×2): qty 1

## 2023-05-23 MED ORDER — HYDROCORTISONE (PERIANAL) 2.5 % EX CREA: 1.0000 | TOPICAL_CREAM | Freq: Two times a day (BID) | CUTANEOUS | Status: DC | PRN

## 2023-05-23 MED ORDER — FENOFIBRATE 160 MG PO TABS
160.0000 mg | ORAL_TABLET | Freq: Every day | ORAL | Status: DC
  Filled 2023-05-23: qty 1

## 2023-05-23 MED ORDER — HEPARIN SODIUM (PORCINE) 5000 UNIT/ML IJ SOLN
5000.0000 [IU] | Freq: Two times a day (BID) | INTRAMUSCULAR | Status: DC
  Administered 2023-05-23: 5000 [IU] via SUBCUTANEOUS
  Filled 2023-05-23 (×2): qty 1

## 2023-05-23 MED ORDER — ONDANSETRON HCL 4 MG PO TABS: 4.0000 mg | ORAL_TABLET | Freq: Four times a day (QID) | ORAL | Status: DC | PRN

## 2023-05-23 MED ORDER — SENNOSIDES-DOCUSATE SODIUM 8.6-50 MG PO TABS: 1.0000 | ORAL_TABLET | Freq: Every evening | ORAL | Status: DC | PRN

## 2023-05-23 MED ORDER — BENAZEPRIL HCL 5 MG PO TABS
5.0000 mg | ORAL_TABLET | Freq: Every day | ORAL | Status: DC
  Administered 2023-05-23: 5 mg via ORAL
  Filled 2023-05-23 (×2): qty 1

## 2023-05-23 NOTE — ED Triage Notes (Signed)
Patient arrived by EMS from [facility]. Staff concerned for stroke. History left sided deficits from stroke.   Patient reports she started feeling not her normal self around 7:50am. Staff reports left sided facial droop. Patient believes her speech is slurred.   Patients baseline is ambulating with walker  EMS vitals: 197 CBG 172/87 b/p 95HR 99% RA 17RR

## 2023-05-23 NOTE — ED Notes (Signed)
Daughter at bedside.

## 2023-05-23 NOTE — ED Provider Notes (Signed)
[facility] Provider Note    Event Date/Time   First MD Initiated Contact with Patient 05/23/23 [phone]     (approximate)   History   Facial Droop   HPI  Laurie Harris is a 82 y.o. female with a history of ICH previously on Coumadin as well as thrombocytopenia presents to the ER for evaluation of several days to weeks of progressive worsening confusion slurred speech.  No recent medication changes.  Reportedly had a choking spell this morning as well as staff at [facility] suspecting that she had some facial droop.  Daughter at bedside currently says that she looks at her baseline for the past few weeks but has noted decline since then.     Physical Exam   Triage Vital Signs: ED Triage Vitals  Encounter Vitals Group     BP      Systolic BP Percentile      Diastolic BP Percentile      Pulse      Resp      Temp      Temp src      SpO2      Weight      Height      Head Circumference      Peak Flow      Pain Score      Pain Loc      Pain Education      Exclude from Growth Chart     Most recent vital signs: Vitals:   05/23/23 1100 05/23/23 1130  BP: (!) 157/66 (!) 177/82  Pulse:  77  Resp: 14 19  Temp:    SpO2:  100%     Constitutional: Alert, slow to respond.  Slurred dysarthric speech Eyes: Conjunctivae are normal.  Head: Atraumatic. Nose: No congestion/rhinnorhea. Mouth/Throat: Mucous membranes are moist.   Neck: Painless ROM.  Cardiovascular:   Good peripheral circulation. Respiratory: Normal respiratory effort.  No retractions.  Gastrointestinal: Soft and nontender.  Musculoskeletal:  no deformity Neurologic:  MAE spontaneously.  Slurred speech.  Slow to respond.  No facial droop. Skin:  Skin is warm, dry and intact. No rash noted. Psychiatric: Mood and affect are normal. Speech and behavior are normal.    ED Results / Procedures / Treatments   Labs (all labs ordered are listed, but only abnormal results are  displayed) Labs Reviewed  CBC - Abnormal; Notable for the following components:      Result Value   RBC 3.39 (*)    HCT 34.7 (*)    MCV 102.4 (*)    MCH 36.3 (*)    Platelets 107 (*)    All other components within normal limits  COMPREHENSIVE METABOLIC PANEL - Abnormal; Notable for the following components:   Glucose, Bld 179 (*)    BUN 31 (*)    Creatinine, Ser 1.01 (*)    Albumin 3.4 (*)    AST 53 (*)    ALT 48 (*)    Alkaline Phosphatase 135 (*)    GFR, Estimated 55 (*)    All other components within normal limits  URINE DRUG SCREEN, QUALITATIVE ([facility] ONLY) - Abnormal; Notable for the following components:   MDMA (Ecstasy)Ur Screen POSITIVE (*)    All other components within normal limits  URINALYSIS, ROUTINE W REFLEX MICROSCOPIC - Abnormal; Notable for the following components:   Color, Urine YELLOW (*)    APPearance HAZY (*)    Hgb urine dipstick MODERATE (*)    Bacteria,  UA RARE (*)    All other components within normal limits  CBG MONITORING, ED - Abnormal; Notable for the following components:   Glucose-Capillary 160 (*)    All other components within normal limits  SARS CORONAVIRUS 2 BY RT PCR  ETHANOL  PROTIME-INR  APTT  DIFFERENTIAL     EKG  ED ECG REPORT I, Willy Eddy, the attending physician, personally viewed and interpreted this ECG.   Date: 05/23/2023  EKG Time: 8:58  Rate: 90  Rhythm: sinus  Axis: normal  Intervals: normal  ST&T Change: no stemi, no depressions    RADIOLOGY Please see ED Course for my review and interpretation.  I personally reviewed all radiographic images ordered to evaluate for the above acute complaints and reviewed radiology reports and findings.  These findings were personally discussed with the patient.  Please see medical record for radiology report.    PROCEDURES:  Critical Care performed: No  Procedures   MEDICATIONS ORDERED IN ED: Medications  0.9 %  sodium chloride infusion (has no  administration in time range)     IMPRESSION / MDM / ASSESSMENT AND PLAN / ED COURSE  I reviewed the triage vital signs and the nursing notes.                              Differential diagnosis includes, but is not limited to, cva, tia, hypoglycemia, dehydration, electrolyte abnormality, dissection, sepsis  Patient presenting to the ER for evaluation of symptoms as described above.  Based on symptoms, risk factors and considered above differential, this presenting complaint could reflect a potentially life-threatening illness therefore the patient will be placed on continuous pulse oximetry and telemetry for monitoring.  Laboratory evaluation will be sent to evaluate for the above complaints.      Clinical Course as of 05/23/23 1439  Thu May 23, 2023  0865 CT head on my review and interpretation without evidence of acute hemorrhage.  Will await formal radiology report. [PR]  1240 Workup thus far is unremarkable. Mild dehydration we will give some IV fluids but no leukocytosis no sepsis.  Urinalysis without evidence of infection.  COVID-negative.  CT head without acute finding.  Will order MRI to further evaluate. [PR]  1432 UDS is positive for MDMA.  I do not see any medications listed that I would expect to cross-react with this test.  Patient still not back at her baseline per family.  Given her rapid deterioration they are not comfortable with patient being discharged to facility as they do not have outpatient follow-up available until the end of next month.  Based on her age risk factors and acute change I do think is reasonable discussed case with hospitalist for admission for further workup possible neuroconsultation. [PR]    Clinical Course User Index [PR] Willy Eddy, MD     FINAL CLINICAL IMPRESSION(S) / ED DIAGNOSES   Final diagnoses:  Altered mental status, unspecified altered mental status type     Rx / DC Orders   ED Discharge Orders     None         Note:  This document was prepared using Dragon voice recognition software and may include unintentional dictation errors.    Willy Eddy, MD 05/23/23 [phone]

## 2023-05-23 NOTE — H&P (Signed)
History and Physical    Laurie Harris NWG:[id number] DOB: [birth year] DOA: 05/23/2023  PCP: Glori Luis, MD (Confirm with patient/family/NH records and if not entered, this has to be entered at [facility] point of entry) Patient coming from: Assisted living  I have personally briefly reviewed patient's old medical records in [facility] Link  Chief Complaint: Slurred speech, unsteady gait  HPI: Laurie Harris is a 82 y.o. female with medical history significant of ICH cerebella 2016, CKD stage IIIa, IDDM, HTN, HLD, brought in by family member for evaluation of worsening of slurred speech and unsteady gait.  According to family, she was never diagnosed with dementia however her cognition and and ability to do with daily activity has significant decline recently.  2 months ago patient lost her son and since then patient started to develop declining of her cognition with episode of confusion sundowning, and short memory impairment.  Family also described that the patient has frequent episode of visual hallucinations recently.  Meantime, patient also gradually became more unsteady even using a walker which she has been dependent on when waling for last 3 years.  No personality changes denies any depression.  Patient was seen by PCP several weeks ago who suspect patient had a stroke and ordered an MRI which came back negative.  Since then, almost on a weekly basis patient will develop slurred speech as has been happening this morning.  Which triggered family to send patient to ED again.  There was no new medications.  Recently.  Significantly family also reported patient has significant disturbance of her sleep cycle recently with her taking more frequent daytime napping left wrist from couple minutes to few hours, and patient does not sleep at night and refused to taking any sleep eating medications.  ED Course: Afebrile, sinus, no hypoxia.  Blood pressure slightly elevated.  CT head and brain MRI  negative for acute findings UA negative for UTI.   Review of Systems: As per HPI otherwise 14 point review of systems negative.    Past Medical History:  Diagnosis Date   Carotid arterial disease (HCC)    a. 09/2016 Carotid U/S: R carotid bifurcation dzs of 50-69%, L carotid bifurcation dzs of <50%. Patent vertebral arteries w/ anegrade flow.   Cerebellar hemorrhage (HCC) 03/16/2015   Left Cerebellar Hemorrhage- See Care Everywhere Seen by [facility]     Chicken pox    CKD (chronic kidney disease), stage III (HCC)    functioning at 46%   Diabetes mellitus without complication (HCC)    Hemorrhagic stroke (HCC) 12/31/2014   a. in the setting of warfarin therapy.   History of appendicitis 08/07/2016   Hyperlipidemia    Hypertension    Syncope    a. 09/2016 Echo: EF 55-60%, no rwma, Gr1 DD.    Past Surgical History:  Procedure Laterality Date   ABCESS DRAINAGE  06/14/2016   appendix   APPENDECTOMY  06/13/2016   CATARACT EXTRACTION W/PHACO Left 09/04/2017   Procedure: CATARACT EXTRACTION PHACO AND INTRAOCULAR LENS PLACEMENT (IOC) COMPLICATED LEFT DIABETIC;  Surgeon: Lockie Mola, MD;  Location: [facility];  Service: Ophthalmology;  Laterality: Left;  Diabetic - insulin   CATARACT EXTRACTION W/PHACO Right 10/02/2017   Procedure: CATARACT EXTRACTION PHACO AND INTRAOCULAR LENS PLACEMENT (IOC) COMPLICATED  RIGHT DIABETIC;  Surgeon: Lockie Mola, MD;  Location: [facility];  Service: Ophthalmology;  Laterality: Right;  Diabetic - insulin   CHOLECYSTECTOMY N/A 06/17/2018   Procedure: LAPAROSCOPIC CHOLECYSTECTOMY;  Surgeon: Tonna Boehringer,  Isami, DO;  Location: [facility];  Service: General;  Laterality: N/A;   COLONOSCOPY     LAPAROSCOPIC APPENDECTOMY N/A 06/08/2022   Procedure: APPENDECTOMY LAPAROSCOPIC;  Surgeon: Henrene Dodge, MD;  Location: [facility];  Service: General;  Laterality: N/A;   NECK SURGERY       reports that she has never smoked. She has never  used smokeless tobacco. She reports that she does not drink alcohol and does not use drugs.  Allergies  Allergen Reactions   Warfarin And Related     Possible stroke   Warfarin     Family History  Problem Relation Age of Onset   Diabetes Mother    Dementia Mother    Heart disease Mother    Cancer Mother        lung   Cancer Father        colon   Stroke Father    Stroke Daughter    Hyperlipidemia Daughter    Breast cancer Neg Hx      Prior to Admission medications   Medication Sig Start Date End Date Taking? Authorizing Provider  acetaminophen (TYLENOL) 325 MG tablet Take 650 mg by mouth every 6 (six) hours as needed.    [provider]  aspirin EC 81 MG tablet Take 81 mg by mouth daily.    [provider]  BD AUTOSHIELD DUO 30G X 5 MM MISC USE TWICE A DAY. 10/04/22   Glori Luis, MD  benazepril (LOTENSIN) 5 MG tablet TAKE ONE TABLET EVERY DAY Patient taking differently: Take 5 mg by mouth daily. 05/12/19   Glori Luis, MD  calcium carbonate (OS-CAL) 600 MG TABS tablet Take one tablet by mouth once a day. Patient taking differently: Take 500 mg by mouth daily. Oyst shell calcium 500 mg tab once a day 12/01/20   Glori Luis, MD  Cholecalciferol (VITAMIN D3) 50 MCG (2000 UT) TABS Take 2,000 Units by mouth daily.    [provider]  fenofibrate 160 MG tablet Take 1 tablet (160 mg total) by mouth daily. 02/03/20   Glori Luis, MD  HUMALOG MIX 75/25 KWIKPEN (75-25) 100 UNIT/ML KwikPen INJECT 14 UNITS SUBCUTANEOUSLY TWICE A DAY WITH A MEAL. 09/24/22   Glori Luis, MD  hydrocortisone (ANUSOL-HC) 2.5 % rectal cream Place 1 Application rectally 2 (two) times daily. 05/03/22   Toney Reil, MD  Multiple Vitamin (MULTIVITAMIN WITH MINERALS) TABS tablet Take 1 tablet by mouth daily.    [provider]  NOVOFINE PLUS 32G X 4 MM MISC USE TWICE A DAY AS DIRECTED 02/22/20   Glori Luis, MD  pantoprazole (PROTONIX) 40 MG  tablet TAKE 1 TABLET BY MOUTH EVERY-OTHER-DAY Patient taking differently: Take 40 mg by mouth every other day. 04/25/20   Glori Luis, MD  rosuvastatin (CRESTOR) 40 MG tablet TAKE (1) TABLET BY MOUTH ONCE A DAY Patient taking differently: Take 40 mg by mouth daily. 01/10/21   Glori Luis, MD    Physical Exam: Vitals:   05/23/23 1200 05/23/23 1230 05/23/23 1543 05/23/23 1600  BP: (!) 154/78 (!) 164/76 (!) 171/80 (!) 158/86  Pulse: 81 77 81 79  Resp: 17 (!) 23 18 13   Temp:      TempSrc:      SpO2: 100% 100% 100% 100%  Weight:      Height:        Constitutional: NAD, calm, comfortable Vitals:   05/23/23 1200 05/23/23 1230 05/23/23 1543 05/23/23 1600  BP: (!) 154/78 (!) 164/76 (!) 171/80 (!) 158/86  Pulse: 81 77 81 79  Resp: 17 (!) 23 18 13   Temp:      TempSrc:      SpO2: 100% 100% 100% 100%  Weight:      Height:       Eyes: PERRL, lids and conjunctivae normal ENMT: Mucous membranes are moist. Posterior pharynx clear of any exudate or lesions.Normal dentition.  Neck: normal, supple, no masses, no thyromegaly Respiratory: clear to auscultation bilaterally, no wheezing, no crackles. Normal respiratory effort. No accessory muscle use.  Cardiovascular: Regular rate and rhythm, no murmurs / rubs / gallops. No extremity edema. 2+ pedal pulses. No carotid bruits.  Abdomen: no tenderness, no masses palpated. No hepatosplenomegaly. Bowel sounds positive.  Musculoskeletal: no clubbing / cyanosis. No joint deformity upper and lower extremities. Good ROM, no contractures. Normal muscle tone.  Skin: no rashes, lesions, ulcers. No induration Neurologic: CN 2-12 grossly intact. Sensation intact, DTR normal. Strength 5/5 in all 4.  Psychiatric: Oriented to person and place, confused about time.     Labs on Admission: I have personally reviewed following labs and imaging studies  CBC: Recent Labs  Lab 05/23/23 0901  WBC 4.5  NEUTROABS 3.1  HGB 12.3  HCT 34.7*  MCV 102.4*   PLT 107*   Basic Metabolic Panel: Recent Labs  Lab 05/23/23 0901  NA 142  K 3.6  CL 110  CO2 23  GLUCOSE 179*  BUN 31*  CREATININE 1.01*  CALCIUM 9.5   GFR: Estimated Creatinine Clearance: 36.5 mL/min (A) (by C-G formula based on SCr of 1.01 mg/dL (H)). Liver Function Tests: Recent Labs  Lab 05/23/23 0901  AST 53*  ALT 48*  ALKPHOS 135*  BILITOT 0.9  PROT 7.1  ALBUMIN 3.4*   No results for input(s): "LIPASE", "AMYLASE" in the last 168 hours. No results for input(s): "AMMONIA" in the last 168 hours. Coagulation Profile: Recent Labs  Lab 05/23/23 0901  INR 1.1   Cardiac Enzymes: No results for input(s): "CKTOTAL", "CKMB", "CKMBINDEX", "TROPONINI" in the last 168 hours. BNP (last 3 results) No results for input(s): "PROBNP" in the last 8760 hours. HbA1C: No results for input(s): "HGBA1C" in the last 72 hours. CBG: Recent Labs  Lab 05/23/23 0905  GLUCAP 160*   Lipid Profile: No results for input(s): "CHOL", "HDL", "LDLCALC", "TRIG", "CHOLHDL", "LDLDIRECT" in the last 72 hours. Thyroid Function Tests: No results for input(s): "TSH", "T4TOTAL", "FREET4", "T3FREE", "THYROIDAB" in the last 72 hours. Anemia Panel: No results for input(s): "VITAMINB12", "FOLATE", "FERRITIN", "TIBC", "IRON", "RETICCTPCT" in the last 72 hours. Urine analysis:    Component Value Date/Time   COLORURINE YELLOW (A) 05/23/2023 1159   APPEARANCEUR HAZY (A) 05/23/2023 1159   APPEARANCEUR Clear 12/31/2014 1822   LABSPEC 1.017 05/23/2023 1159   LABSPEC 1.009 12/31/2014 1822   PHURINE 5.0 05/23/2023 1159   GLUCOSEU NEGATIVE 05/23/2023 1159   GLUCOSEU >=500 12/31/2014 1822   HGBUR MODERATE (A) 05/23/2023 1159   BILIRUBINUR NEGATIVE 05/23/2023 1159   BILIRUBINUR neg 05/10/2023 1358   BILIRUBINUR Negative 12/31/2014 1822   KETONESUR NEGATIVE 05/23/2023 1159   PROTEINUR NEGATIVE 05/23/2023 1159   UROBILINOGEN 0.2 05/10/2023 1358   NITRITE NEGATIVE 05/23/2023 1159   LEUKOCYTESUR  NEGATIVE 05/23/2023 1159   LEUKOCYTESUR Negative 12/31/2014 1822    Radiological Exams on Admission: MR BRAIN WO CONTRAST  Result Date: 05/23/2023 CLINICAL DATA:  Neuro deficit, acute, stroke suspected EXAM: MRI HEAD WITHOUT CONTRAST TECHNIQUE: Multiplanar, multiecho pulse sequences of  the brain and surrounding structures were obtained without intravenous contrast. COMPARISON:  Same-day CT head, brain MR 05/03/2023 FINDINGS: Brain: Negative for an acute infarct. No hemorrhage. No hydrocephalus. No extra-axial fluid collection. Mass effect. No mass lesion. Chronic pontine and left cerebellar hemorrhages. There is sequela of mild overall chronic microvascular ischemic change. Vascular: Normal flow voids. Skull and upper cervical spine: Degenerative changes in the upper cervical spine. Sinuses/Orbits: No middle ear or mastoid effusion. Paranasal sinuses are clear. Bilateral lens replacement. Orbits are otherwise unremarkable. Other: None. IMPRESSION: No acute intracranial process. Electronically Signed   By: Lorenza Cambridge M.D.   On: 05/23/2023 15:22   CT HEAD WO CONTRAST ( )  Result Date: 05/23/2023 CLINICAL DATA:  Neuro deficit, acute, stroke suspected EXAM: CT HEAD WITHOUT CONTRAST TECHNIQUE: Contiguous axial images were obtained from the base of the skull through the vertex without intravenous contrast. RADIATION DOSE REDUCTION: This exam was performed according to the departmental dose-optimization program which includes automated exposure control, adjustment of the mA and/or kV according to patient size and/or use of iterative reconstruction technique. COMPARISON:  CT Head 05/10/23 FINDINGS: Brain: No hemorrhage. No hydrocephalus. No extra-axial fluid collection. No CT evidence of an acute infarct. Sequela of mild chronic microvascular ischemic change. Generalized volume loss. Vascular: No hyperdense vessel or unexpected calcification. Skull: Normal. Negative for fracture or focal lesion.  Sinuses/Orbits: No middle ear or mastoid effusion. Paranasal sinuses are clear. There are osseous changes suggestive of chronic left maxillary sinusitis. Bilateral lens replacement. Orbits are otherwise unremarkable. Other: None. IMPRESSION: No hemorrhage or CT evidence of an acute cortical infarct. Electronically Signed   By: Lorenza Cambridge M.D.   On: 05/23/2023 12:14    EKG: Independently reviewed.  Sinus, no acute ST changes.  Assessment/Plan Principal Problem:   AMS (altered mental status)  (please populate well all problems here in Problem List. (For example, if patient is on BP meds at home and you resume or decide to hold them, it is a problem that needs to be her. Same for CAD, COPD, HLD and so on)  Dysarthria, recurrent Ataxia, worsening Visual hallucinations Sundowning syndrome -Clinically suspect Lewy body dementia -Stroke ruled out -Discussed with on-call neurology Dr. Jerrell Belfast, recommended TSH B12 and EEG -PT evaluation -Patient to follow-up with [facility] clinic neurology Dr. Hale Bogus next month for dementia workup -Other DDx, UTI ruled out, discussed with.  Patient family regarding sleep medicine however family refused to take any sleeping medications.  MDMA exposure, question of -UDS positive for MDMA, unknown source -Patient lives in the assisted living and family reported that patient only takes medications ordered by, and she does not take any OTC medications regularly. -Clinically, unlikely MDMA exposure close symptoms patient has had  IDDM -SSI for now -No symptoms or signs of hypoglycemia and most recent A1c 7.2  Hx of cerebellar hemorrhagic stroke -Negative MRI findings x 2 in the last 2 weeks. -Continue aspirin and statin  DVT prophylaxis: Heparin subcu Code Status: DNR Family Communication: Daughter at bedside Disposition Plan: Expect less than 2 midnight hospital stay Consults called: Curbside consult with neurology Admission status: MedSurg  observation   Emeline General MD [facility] Pager [phone]  05/23/2023, 4:25 PM

## 2023-05-23 NOTE — ED Notes (Signed)
MD Roxan Hockey at bedside

## 2023-05-24 ENCOUNTER — Emergency Department: Payer: Medicare Other

## 2023-05-24 ENCOUNTER — Inpatient Hospital Stay
Admission: EM | Admit: 2023-05-24 | Discharge: 2023-06-02 | DRG: 070 | Disposition: A | Discharge status: Hospice | Payer: Medicare Other | Source: Skilled Nursing Facility | Attending: Internal Medicine | Admitting: Internal Medicine

## 2023-05-24 ENCOUNTER — Other Ambulatory Visit: Payer: Self-pay

## 2023-05-24 DIAGNOSIS — A419 Sepsis, unspecified organism: Secondary | ICD-10-CM | POA: Diagnosis not present

## 2023-05-24 DIAGNOSIS — F4321 Adjustment disorder with depressed mood: Secondary | ICD-10-CM | POA: Diagnosis not present

## 2023-05-24 DIAGNOSIS — Z8249 Family history of ischemic heart disease and other diseases of the circulatory system: Secondary | ICD-10-CM

## 2023-05-24 DIAGNOSIS — Z800 Family history of malignant neoplasm of digestive organs: Secondary | ICD-10-CM

## 2023-05-24 DIAGNOSIS — F4320 Adjustment disorder, unspecified: Secondary | ICD-10-CM | POA: Diagnosis present

## 2023-05-24 DIAGNOSIS — T68XXXA Hypothermia, initial encounter: Secondary | ICD-10-CM

## 2023-05-24 DIAGNOSIS — R569 Unspecified convulsions: Secondary | ICD-10-CM | POA: Diagnosis not present

## 2023-05-24 DIAGNOSIS — I5A Non-ischemic myocardial injury (non-traumatic): Secondary | ICD-10-CM | POA: Diagnosis present

## 2023-05-24 DIAGNOSIS — R29810 Facial weakness: Secondary | ICD-10-CM | POA: Diagnosis present

## 2023-05-24 DIAGNOSIS — I2510 Atherosclerotic heart disease of native coronary artery without angina pectoris: Secondary | ICD-10-CM | POA: Diagnosis present

## 2023-05-24 DIAGNOSIS — F05 Delirium due to known physiological condition: Secondary | ICD-10-CM | POA: Diagnosis not present

## 2023-05-24 DIAGNOSIS — I69354 Hemiplegia and hemiparesis following cerebral infarction affecting left non-dominant side: Secondary | ICD-10-CM

## 2023-05-24 DIAGNOSIS — R471 Dysarthria and anarthria: Secondary | ICD-10-CM | POA: Diagnosis present

## 2023-05-24 DIAGNOSIS — R410 Disorientation, unspecified: Secondary | ICD-10-CM | POA: Diagnosis not present

## 2023-05-24 DIAGNOSIS — R6510 Systemic inflammatory response syndrome (SIRS) of non-infectious origin without acute organ dysfunction: Secondary | ICD-10-CM | POA: Diagnosis present

## 2023-05-24 DIAGNOSIS — Z1152 Encounter for screening for COVID-19: Secondary | ICD-10-CM

## 2023-05-24 DIAGNOSIS — E785 Hyperlipidemia, unspecified: Secondary | ICD-10-CM | POA: Diagnosis present

## 2023-05-24 DIAGNOSIS — Z794 Long term (current) use of insulin: Secondary | ICD-10-CM

## 2023-05-24 DIAGNOSIS — I214 Non-ST elevation (NSTEMI) myocardial infarction: Secondary | ICD-10-CM

## 2023-05-24 DIAGNOSIS — R4182 Altered mental status, unspecified: Secondary | ICD-10-CM

## 2023-05-24 DIAGNOSIS — Z823 Family history of stroke: Secondary | ICD-10-CM

## 2023-05-24 DIAGNOSIS — Z801 Family history of malignant neoplasm of trachea, bronchus and lung: Secondary | ICD-10-CM

## 2023-05-24 DIAGNOSIS — F0394 Unspecified dementia, unspecified severity, with anxiety: Secondary | ICD-10-CM | POA: Diagnosis present

## 2023-05-24 DIAGNOSIS — Z515 Encounter for palliative care: Secondary | ICD-10-CM

## 2023-05-24 DIAGNOSIS — Z820 Family history of epilepsy and other diseases of the nervous system: Secondary | ICD-10-CM

## 2023-05-24 DIAGNOSIS — I10 Essential (primary) hypertension: Secondary | ICD-10-CM | POA: Diagnosis present

## 2023-05-24 DIAGNOSIS — W19XXXA Unspecified fall, initial encounter: Secondary | ICD-10-CM | POA: Diagnosis present

## 2023-05-24 DIAGNOSIS — F0392 Unspecified dementia, unspecified severity, with psychotic disturbance: Secondary | ICD-10-CM | POA: Diagnosis present

## 2023-05-24 DIAGNOSIS — N1831 Chronic kidney disease, stage 3a: Secondary | ICD-10-CM | POA: Diagnosis present

## 2023-05-24 DIAGNOSIS — E872 Acidosis, unspecified: Secondary | ICD-10-CM | POA: Diagnosis present

## 2023-05-24 DIAGNOSIS — Z7982 Long term (current) use of aspirin: Secondary | ICD-10-CM

## 2023-05-24 DIAGNOSIS — R000 Tachycardia, unspecified: Secondary | ICD-10-CM | POA: Diagnosis not present

## 2023-05-24 DIAGNOSIS — F0393 Unspecified dementia, unspecified severity, with mood disturbance: Secondary | ICD-10-CM | POA: Diagnosis present

## 2023-05-24 DIAGNOSIS — R001 Bradycardia, unspecified: Secondary | ICD-10-CM | POA: Diagnosis not present

## 2023-05-24 DIAGNOSIS — G9340 Encephalopathy, unspecified: Secondary | ICD-10-CM

## 2023-05-24 DIAGNOSIS — Z634 Disappearance and death of family member: Secondary | ICD-10-CM

## 2023-05-24 DIAGNOSIS — R680 Hypothermia, not associated with low environmental temperature: Secondary | ICD-10-CM | POA: Diagnosis present

## 2023-05-24 DIAGNOSIS — R270 Ataxia, unspecified: Secondary | ICD-10-CM | POA: Diagnosis present

## 2023-05-24 DIAGNOSIS — Z66 Do not resuscitate: Secondary | ICD-10-CM | POA: Diagnosis present

## 2023-05-24 DIAGNOSIS — I2489 Other forms of acute ischemic heart disease: Secondary | ICD-10-CM

## 2023-05-24 DIAGNOSIS — Z79899 Other long term (current) drug therapy: Secondary | ICD-10-CM

## 2023-05-24 DIAGNOSIS — R1310 Dysphagia, unspecified: Secondary | ICD-10-CM | POA: Diagnosis present

## 2023-05-24 DIAGNOSIS — R296 Repeated falls: Principal | ICD-10-CM

## 2023-05-24 DIAGNOSIS — Z833 Family history of diabetes mellitus: Secondary | ICD-10-CM

## 2023-05-24 DIAGNOSIS — E1129 Type 2 diabetes mellitus with other diabetic kidney complication: Secondary | ICD-10-CM | POA: Diagnosis present

## 2023-05-24 DIAGNOSIS — E860 Dehydration: Secondary | ICD-10-CM | POA: Diagnosis present

## 2023-05-24 DIAGNOSIS — E1122 Type 2 diabetes mellitus with diabetic chronic kidney disease: Secondary | ICD-10-CM | POA: Diagnosis present

## 2023-05-24 DIAGNOSIS — R0902 Hypoxemia: Secondary | ICD-10-CM | POA: Diagnosis not present

## 2023-05-24 DIAGNOSIS — I129 Hypertensive chronic kidney disease with stage 1 through stage 4 chronic kidney disease, or unspecified chronic kidney disease: Secondary | ICD-10-CM | POA: Diagnosis present

## 2023-05-24 DIAGNOSIS — G9341 Metabolic encephalopathy: Principal | ICD-10-CM | POA: Diagnosis present

## 2023-05-24 DIAGNOSIS — Z888 Allergy status to other drugs, medicaments and biological substances status: Secondary | ICD-10-CM

## 2023-05-24 DIAGNOSIS — D696 Thrombocytopenia, unspecified: Secondary | ICD-10-CM | POA: Diagnosis present

## 2023-05-24 DIAGNOSIS — Z83438 Family history of other disorder of lipoprotein metabolism and other lipidemia: Secondary | ICD-10-CM

## 2023-05-24 LAB — CBC WITH DIFFERENTIAL/PLATELET
Abs Immature Granulocytes: 0.07 10*3/uL (ref 0.00–0.07)
Basophils Absolute: 0 10*3/uL (ref 0.0–0.1)
Basophils Relative: 0 %
Eosinophils Absolute: 0 10*3/uL (ref 0.0–0.5)
Eosinophils Relative: 0 %
HCT: 37 % (ref 36.0–46.0)
Hemoglobin: 12.5 g/dL (ref 12.0–15.0)
Immature Granulocytes: 1 %
Lymphocytes Relative: 12 %
Lymphs Abs: 0.8 10*3/uL (ref 0.7–4.0)
MCH: 34.3 pg — ABNORMAL HIGH (ref 26.0–34.0)
MCHC: 33.8 g/dL (ref 30.0–36.0)
MCV: 101.6 fL — ABNORMAL HIGH (ref 80.0–100.0)
Monocytes Absolute: 0.5 10*3/uL (ref 0.1–1.0)
Monocytes Relative: 8 %
Neutro Abs: 5.5 10*3/uL (ref 1.7–7.7)
Neutrophils Relative %: 79 %
Platelets: 108 10*3/uL — ABNORMAL LOW (ref 150–400)
RBC: 3.64 MIL/uL — ABNORMAL LOW (ref 3.87–5.11)
RDW: 14.9 % (ref 11.5–15.5)
WBC: 6.9 10*3/uL (ref 4.0–10.5)
nRBC: 0 % (ref 0.0–0.2)

## 2023-05-24 LAB — CBG MONITORING, ED
Glucose-Capillary: 126 mg/dL — ABNORMAL HIGH (ref 70–99)
Glucose-Capillary: 140 mg/dL — ABNORMAL HIGH (ref 70–99)
Glucose-Capillary: 193 mg/dL — ABNORMAL HIGH (ref 70–99)

## 2023-05-24 LAB — T4, FREE: Free T4: 0.89 ng/dL (ref 0.61–1.12)

## 2023-05-24 LAB — VITAMIN B12: Vitamin B-12: 436 pg/mL (ref 180–914)

## 2023-05-24 NOTE — Consult Note (Addendum)
[facility] Face-to-Face Psychiatry Consult   Reason for Consult: Psychiatry consult requested by Hospitalist related to recent death of son, depression and grief.  Referring Physician: Sunnie Nielsen, DO  Patient Identification: Laurie Harris MRN:  [id number] Principal Diagnosis: AMS (altered mental status) Diagnosis:  Principal Problem:   AMS (altered mental status) Active Problems:   Grief   Total Time spent with patient: 30 minutes  Subjective:   Laurie Harris is a 82 y.o. female patient admitted with progressive confusion and slurred speech.  HPI:  "They think I might have had a stroke in April. My speech was slurred, not like it was before.  I lost my balance also with my left side being more affected."   Her son died in 07/08/2024from a fall which has caused depression and grief. She reports "vivid" dreams of her late husband and son who have passed. She is not bothered by the dreams as they are loved ones from the past. "My dreams are seem so real.   I saw a little boy that looked just like [name] (her son) and [name] (her deceased husband".  The client also reported these occur during her sleep.  Eating/drinking and sleeping well. Denies substance use.  Declines interest in medication for mood. Engaged easily in conversation with appropriate history at the time of her assessment.  She did have to recount some of the exact dates.  Moderate depression related to grief, no suicidal/homicidal ideations or substance abuse.  She lives at [facility], assisted living, and will return to live there.  Collateral Information from her daughter, Clayborne Artist: [name] is present at the Hospital and spoke to her outside of the client's room. She explains that her mother occasionally sees her deceased father and brother vividly in dreams and told her recently that she sees them during the day. Ms. Christin Fudge continued with "She knows these dreams are not real and they do not frighten  her". Her mother doesn't like taking medication and declines Rx for depression or grief at this time along with medications for hallucinations.  "She only takes medications from Dr. Ria Comment" anyway.  The daughter had no mental health needs for the client. She feels her mother's physical symptoms and her confusion have increased since the death of her son in 2023-04-10 that was unexpected, "I think she is getting closer to the other side".   Past Psychiatric History: None  Risk to Self:  No  Risk to Others:  No Prior Inpatient Therapy:  No Prior Outpatient Therapy:  No  Past Medical History:  Past Medical History:  Diagnosis Date   Carotid arterial disease (HCC)    a. 09/2016 Carotid U/S: R carotid bifurcation dzs of 50-69%, L carotid bifurcation dzs of <50%. Patent vertebral arteries w/ anegrade flow.   Cerebellar hemorrhage (HCC) 03/16/2015   Left Cerebellar Hemorrhage- See Care Everywhere Seen by [facility]     Chicken pox    CKD (chronic kidney disease), stage III (HCC)    functioning at 46%   Diabetes mellitus without complication (HCC)    Hemorrhagic stroke (HCC) 12/31/2014   a. in the setting of warfarin therapy.   History of appendicitis 08/07/2016   Hyperlipidemia    Hypertension    Syncope    a. 09/2016 Echo: EF 55-60%, no rwma, Gr1 DD.    Past Surgical History:  Procedure Laterality Date   ABCESS DRAINAGE  06/14/2016   appendix   APPENDECTOMY  06/13/2016   CATARACT  EXTRACTION W/PHACO Left 09/04/2017   Procedure: CATARACT EXTRACTION PHACO AND INTRAOCULAR LENS PLACEMENT (IOC) COMPLICATED LEFT DIABETIC;  Surgeon: Lockie Mola, MD;  Location: [facility];  Service: Ophthalmology;  Laterality: Left;  Diabetic - insulin   CATARACT EXTRACTION W/PHACO Right 10/02/2017   Procedure: CATARACT EXTRACTION PHACO AND INTRAOCULAR LENS PLACEMENT (IOC) COMPLICATED  RIGHT DIABETIC;  Surgeon: Lockie Mola, MD;  Location: [facility];  Service: Ophthalmology;   Laterality: Right;  Diabetic - insulin   CHOLECYSTECTOMY N/A 06/17/2018   Procedure: LAPAROSCOPIC CHOLECYSTECTOMY;  Surgeon: Sung Amabile, DO;  Location: [facility];  Service: General;  Laterality: N/A;   COLONOSCOPY     LAPAROSCOPIC APPENDECTOMY N/A 06/08/2022   Procedure: APPENDECTOMY LAPAROSCOPIC;  Surgeon: Henrene Dodge, MD;  Location: [facility];  Service: General;  Laterality: N/A;   NECK SURGERY     Family History:  Family History  Problem Relation Age of Onset   Diabetes Mother    Dementia Mother    Heart disease Mother    Cancer Mother        lung   Cancer Father        colon   Stroke Father    Stroke Daughter    Hyperlipidemia Daughter    Breast cancer Neg Hx    Family Psychiatric  History: None Social History:  Social History   Substance and Sexual Activity  Alcohol Use Never     Social History   Substance and Sexual Activity  Drug Use Never    Social History   Socioeconomic History   Marital status: Widowed    Spouse name: Not on file   Number of children: Not on file   Years of education: Not on file   Highest education level: 12th grade  Occupational History   Not on file  Tobacco Use   Smoking status: Never   Smokeless tobacco: Never  Vaping Use   Vaping status: Never Used  Substance and Sexual Activity   Alcohol use: Never   Drug use: Never   Sexual activity: Not Currently    Birth control/protection: None  Other Topics Concern   Not on file  Social History Narrative   Not on file   Social Determinants of Health   Financial Resource Strain: Low Risk  (05/09/2023)   Overall Financial Resource Strain (CARDIA)    Difficulty of Paying Living Expenses: Not very hard  Food Insecurity: No Food Insecurity (05/16/2023)   Hunger Vital Sign    Worried About Running Out of Food in the Last Year: Never true    Ran Out of Food in the Last Year: Never true  Transportation Needs: No Transportation Needs (05/16/2023)   PRAPARE - Scientist, research (physical sciences) (Medical): No    Lack of Transportation (Non-Medical): No  Physical Activity: Inactive (05/09/2023)   Exercise Vital Sign    Days of Exercise per Week: 0 days    Minutes of Exercise per Session: 20 min  Stress: Patient Declined (05/09/2023)   [employer] of Occupational Health - Occupational Stress Questionnaire    Feeling of Stress : Patient declined  Social Connections: Moderately Integrated (05/09/2023)   Social Connection and Isolation Panel [NHANES]    Frequency of Communication with Friends and Family: More than three times a week    Frequency of Social Gatherings with Friends and Family: Twice a week    Attends Religious Services: 1 to 4 times per year    Active Member of [employer] or Organizations: Yes  Attends Banker Meetings: 1 to 4 times per year    Marital Status: Widowed   Additional Social History:    Allergies:   Allergies  Allergen Reactions   Warfarin And Related     Possible stroke   Warfarin     Labs:  Results for orders placed or performed during the hospital encounter of 05/23/23 (from the past 48 hour(s))  Ethanol     Status: None   Collection Time: 05/23/23  9:01 AM  Result Value Ref Range   Alcohol, Ethyl (B) <10 <10 mg/dL    Comment: (NOTE) Lowest detectable limit for serum alcohol is 10 mg/dL.  For medical purposes only. Performed at [facility], [address] [place], [place] [address]   Protime-INR     Status: None   Collection Time: 05/23/23  9:01 AM  Result Value Ref Range   Prothrombin Time 14.7 11.4 - 15.2 seconds   INR 1.1 0.8 - 1.2    Comment: (NOTE) INR goal varies based on device and disease states. Performed at [facility], [address] [place], [place] [address]   APTT     Status: None   Collection Time: 05/23/23  9:01 AM  Result Value Ref Range   aPTT 34 24 - 36 seconds    Comment: Performed at [facility], [address] [place], [place] [address]  CBC      Status: Abnormal   Collection Time: 05/23/23  9:01 AM  Result Value Ref Range   WBC 4.5 4.0 - 10.5 K/uL   RBC 3.39 (L) 3.87 - 5.11 MIL/uL   Hemoglobin 12.3 12.0 - 15.0 g/dL   HCT 82.9 (L) 56.2 - 13.0 %   MCV 102.4 (H) 80.0 - 100.0 fL   MCH 36.3 (H) 26.0 - 34.0 pg   MCHC 35.4 30.0 - 36.0 g/dL   RDW 86.5 78.4 - 69.6 %   Platelets 107 (L) 150 - 400 K/uL    Comment: Immature Platelet Fraction may be clinically indicated, consider ordering this additional test [id number]    nRBC 0.0 0.0 - 0.2 %    Comment: Performed at [facility], [address] [place], [place] [address]  Differential     Status: None   Collection Time: 05/23/23  9:01 AM  Result Value Ref Range   Neutrophils Relative % 68 %   Neutro Abs 3.1 1.7 - 7.7 K/uL   Lymphocytes Relative 22 %   Lymphs Abs 1.0 0.7 - 4.0 K/uL   Monocytes Relative 7 %   Monocytes Absolute 0.3 0.1 - 1.0 K/uL   Eosinophils Relative 1 %   Eosinophils Absolute 0.1 0.0 - 0.5 K/uL   Basophils Relative 1 %   Basophils Absolute 0.0 0.0 - 0.1 K/uL   Immature Granulocytes 1 %   Abs Immature Granulocytes 0.03 0.00 - 0.07 K/uL    Comment: Performed at [facility], [address] [place], [place] [address]  Comprehensive metabolic panel     Status: Abnormal   Collection Time: 05/23/23  9:01 AM  Result Value Ref Range   Sodium 142 135 - 145 mmol/L   Potassium 3.6 3.5 - 5.1 mmol/L   Chloride 110 98 - 111 mmol/L   CO2 23 22 - 32 mmol/L   Glucose, Bld 179 (H) 70 - 99 mg/dL    Comment: Glucose reference range applies only to samples taken after fasting for at least 8 hours.   BUN 31 (H) 8 -  23 mg/dL   Creatinine, Ser 0.86 (H) 0.44 - 1.00 mg/dL   Calcium 9.5 8.9 - 57.8 mg/dL   Total Protein 7.1 6.5 - 8.1 g/dL   Albumin 3.4 (L) 3.5 - 5.0 g/dL   AST 53 (H) 15 - 41 U/L   ALT 48 (H) 0 - 44 U/L   Alkaline Phosphatase 135 (H) 38 - 126 U/L   Total Bilirubin 0.9 0.3 - 1.2 mg/dL   GFR, Estimated 55 (L) >60 mL/min    Comment:  (NOTE) Calculated using the CKD-EPI Creatinine Equation (2021)    Anion gap 9 5 - 15    Comment: Performed at [facility], [address] [place], [place] [address]  TSH     Status: Abnormal   Collection Time: 05/23/23  9:01 AM  Result Value Ref Range   TSH 6.150 (H) 0.350 - 4.500 uIU/mL    Comment: Performed by a 3rd Generation assay with a functional sensitivity of <=0.01 uIU/mL. Performed at [facility], [address] [place], [place] [address]   T4, free     Status: None   Collection Time: 05/23/23  9:01 AM  Result Value Ref Range   Free T4 0.89 0.61 - 1.12 ng/dL    Comment: (NOTE) Biotin ingestion may interfere with free T4 tests. If the results are inconsistent with the TSH level, previous test results, or the clinical presentation, then consider biotin interference. If needed, order repeat testing after stopping biotin. Performed at [facility], [address] [place], [place] [address]   CBG monitoring, ED     Status: Abnormal   Collection Time: 05/23/23  9:05 AM  Result Value Ref Range   Glucose-Capillary 160 (H) 70 - 99 mg/dL    Comment: Glucose reference range applies only to samples taken after fasting for at least 8 hours.   Comment 1 Notify RN    Comment 2 Document in Chart   SARS Coronavirus 2 by RT PCR (hospital order, performed in [facility] hospital lab) *cepheid single result test* Anterior Nasal Swab     Status: None   Collection Time: 05/23/23 11:20 AM   Specimen: Anterior Nasal Swab  Result Value Ref Range   SARS Coronavirus 2 by RT PCR NEGATIVE NEGATIVE    Comment: (NOTE) SARS-CoV-2 target nucleic acids are NOT DETECTED.  The SARS-CoV-2 RNA is generally detectable in upper and lower respiratory specimens during the acute phase of infection. The lowest concentration of SARS-CoV-2 viral copies this assay can detect is 250 copies / mL. A negative result does not preclude SARS-CoV-2 infection and should not be used as  the sole basis for treatment or other patient management decisions.  A negative result may occur with improper specimen collection / handling, submission of specimen other than nasopharyngeal swab, presence of viral mutation(s) within the areas targeted by this assay, and inadequate number of viral copies (<250 copies / mL). A negative result must be combined with clinical observations, patient history, and epidemiological information.  Fact Sheet for Patients:   RoadLapTop.co.za  Fact Sheet for Healthcare Providers: http://kim-miller.com/  This test is not yet approved or  cleared by the [place] FDA and has been authorized for detection and/or diagnosis of SARS-CoV-2 by FDA under an Emergency Use Authorization (EUA).  This EUA will remain in effect (meaning this test can be used) for the duration of the COVID-19 declaration under Section 564(b)(1) of the Act, 21 U.S.C. section 360bbb-3(b)(1), unless the authorization is terminated or revoked sooner.  Performed at [facility], [address] [place], [place] [address]   Urine Drug Screen, Qualitative     Status: Abnormal   Collection Time: 05/23/23 11:59 AM  Result Value Ref Range   Tricyclic, Ur Screen NONE DETECTED NONE DETECTED   Amphetamines, Ur Screen NONE DETECTED NONE DETECTED   MDMA (Ecstasy)Ur Screen POSITIVE (A) NONE DETECTED   Cocaine Metabolite,Ur [place] NONE DETECTED NONE DETECTED   Opiate, Ur Screen NONE DETECTED NONE DETECTED   Phencyclidine (PCP) Ur S NONE DETECTED NONE DETECTED   Cannabinoid 50 Ng, Ur [place] NONE DETECTED NONE DETECTED   Barbiturates, Ur Screen NONE DETECTED NONE DETECTED   Benzodiazepine, Ur Scrn NONE DETECTED NONE DETECTED   Methadone Scn, Ur NONE DETECTED NONE DETECTED    Comment: (NOTE) Tricyclics + metabolites, urine    Cutoff 1000 ng/mL Amphetamines + metabolites, urine  Cutoff 1000 ng/mL MDMA (Ecstasy), urine              Cutoff 500  ng/mL Cocaine Metabolite, urine          Cutoff 300 ng/mL Opiate + metabolites, urine        Cutoff 300 ng/mL Phencyclidine (PCP), urine         Cutoff 25 ng/mL Cannabinoid, urine                 Cutoff 50 ng/mL Barbiturates + metabolites, urine  Cutoff 200 ng/mL Benzodiazepine, urine              Cutoff 200 ng/mL Methadone, urine                   Cutoff 300 ng/mL  The urine drug screen provides only a preliminary, unconfirmed analytical test result and should not be used for non-medical purposes. Clinical consideration and professional judgment should be applied to any positive drug screen result due to possible interfering substances. A more specific alternate chemical method must be used in order to obtain a confirmed analytical result. Gas chromatography / mass spectrometry (GC/MS) is the preferred confirm atory method. Performed at [facility], [address] [place], [place] [address]   Urinalysis, Routine w reflex microscopic -Urine, Clean Catch     Status: Abnormal   Collection Time: 05/23/23 11:59 AM  Result Value Ref Range   Color, Urine YELLOW (A) YELLOW   APPearance HAZY (A) CLEAR   Specific Gravity, Urine 1.017 1.005 - 1.030   pH 5.0 5.0 - 8.0   Glucose, UA NEGATIVE NEGATIVE mg/dL   Hgb urine dipstick MODERATE (A) NEGATIVE   Bilirubin Urine NEGATIVE NEGATIVE   Ketones, ur NEGATIVE NEGATIVE mg/dL   Protein, ur NEGATIVE NEGATIVE mg/dL   Nitrite NEGATIVE NEGATIVE   Leukocytes,Ua NEGATIVE NEGATIVE   RBC / HPF 0-5 0 - 5 RBC/hpf   WBC, UA 0-5 0 - 5 WBC/hpf   Bacteria, UA RARE (A) NONE SEEN   Squamous Epithelial / HPF 0-5 0 - 5 /HPF   Mucus PRESENT    Hyaline Casts, UA PRESENT     Comment: Performed at [facility], [address] [place], [place] [address]  CBG monitoring, ED     Status: None   Collection Time: 05/23/23  5:39 PM  Result Value Ref Range   Glucose-Capillary 86 70 - 99 mg/dL    Comment: Glucose reference range applies only to  samples taken after fasting for at least 8 hours.  CBG monitoring, ED     Status: Abnormal   Collection Time: 05/23/23  10:07 PM  Result Value Ref Range   Glucose-Capillary 217 (H) 70 - 99 mg/dL    Comment: Glucose reference range applies only to samples taken after fasting for at least 8 hours.  CBG monitoring, ED     Status: Abnormal   Collection Time: 05/24/23  7:30 AM  Result Value Ref Range   Glucose-Capillary 126 (H) 70 - 99 mg/dL    Comment: Glucose reference range applies only to samples taken after fasting for at least 8 hours.  CBG monitoring, ED     Status: Abnormal   Collection Time: 05/24/23 11:34 AM  Result Value Ref Range   Glucose-Capillary 193 (H) 70 - 99 mg/dL    Comment: Glucose reference range applies only to samples taken after fasting for at least 8 hours.    Current Facility-Administered Medications  Medication Dose Route Frequency Provider Last Rate Last Admin   acetaminophen (TYLENOL) tablet 650 mg  650 mg Oral Q6H PRN Emeline General, MD       aspirin EC tablet 81 mg  81 mg Oral Daily Mikey College T, MD   81 mg at 05/23/23 1742   benazepril (LOTENSIN) tablet 5 mg  5 mg Oral Daily Mikey College T, MD   5 mg at 05/23/23 1800   fenofibrate tablet 160 mg  160 mg Oral Daily Mikey College T, MD       heparin injection 5,000 Units  5,000 Units Subcutaneous Q12H Mikey College T, MD   5,000 Units at 05/23/23 2248   hydrocortisone (ANUSOL-HC) 2.5 % rectal cream 1 Application  1 Application Rectal BID PRN Mikey College T, MD       insulin aspart (novoLOG) injection 0-9 Units  0-9 Units Subcutaneous TID WC Mikey College T, MD   2 Units at 05/24/23 1205   ondansetron (ZOFRAN) tablet 4 mg  4 mg Oral Q6H PRN Emeline General, MD       Or   ondansetron [facility]) injection 4 mg  4 mg Intravenous Q6H PRN Mikey College T, MD       pantoprazole (PROTONIX) EC tablet 40 mg  40 mg Oral Venda Rodes, MD       senna-docusate (Senokot-S) tablet 1 tablet  1 tablet Oral QHS PRN Emeline General, MD        Current Outpatient Medications  Medication Sig Dispense Refill   aspirin EC 81 MG tablet Take 81 mg by mouth daily.     benazepril (LOTENSIN) 5 MG tablet TAKE ONE TABLET EVERY DAY (Patient taking differently: Take 5 mg by mouth daily.) 90 tablet 2   calcium carbonate (OS-CAL) 600 MG TABS tablet Take one tablet by mouth once a day. (Patient taking differently: Take 500 mg by mouth daily. Oyst shell calcium 500 mg tab once a day) 30 tablet 10   Cholecalciferol (VITAMIN D3) 50 MCG (2000 UT) TABS Take 2,000 Units by mouth daily.     fenofibrate 160 MG tablet Take 1 tablet (160 mg total) by mouth daily. 90 tablet 1   HUMALOG MIX 75/25 KWIKPEN (75-25) 100 UNIT/ML KwikPen INJECT 14 UNITS SUBCUTANEOUSLY TWICE A DAY WITH A MEAL. 15 mL 10   hydrocortisone (ANUSOL-HC) 2.5 % rectal cream Place 1 Application rectally 2 (two) times daily. 30 g 1   pantoprazole (PROTONIX) 40 MG tablet TAKE 1 TABLET BY MOUTH EVERY-OTHER-DAY (Patient taking differently: Take 40 mg by mouth every other day.) 15 tablet 10   rosuvastatin (CRESTOR) 40 MG tablet TAKE (1) TABLET BY  MOUTH ONCE A DAY (Patient taking differently: Take 40 mg by mouth daily.) 30 tablet 10   acetaminophen (TYLENOL) 325 MG tablet Take 650 mg by mouth every 6 (six) hours as needed.     BD AUTOSHIELD DUO 30G X 5 MM MISC USE TWICE A DAY. 60 each 10   Multiple Vitamin (MULTIVITAMIN WITH MINERALS) TABS tablet Take 1 tablet by mouth daily. (Patient not taking: Reported on 05/23/2023)     NOVOFINE PLUS 32G X 4 MM MISC USE TWICE A DAY AS DIRECTED 100 each 6    Musculoskeletal: Strength & Muscle Tone: within normal limits Gait & Station:  uses a walker  Patient leans: N/A  Psychiatric Specialty Exam: Physical Exam Vitals and nursing note reviewed.  Constitutional:      Appearance: Normal appearance.  HENT:     Head: Normocephalic.  Pulmonary:     Effort: Pulmonary effort is normal.  Musculoskeletal:     Cervical back: Normal range of motion.   Neurological:     General: No focal deficit present.     Mental Status: She is alert.     Review of Systems  Psychiatric/Behavioral:  Positive for depression and memory loss.   All other systems reviewed and are negative.   Blood pressure (!) 164/61, pulse 89, temperature 98.4 F (36.9 C), temperature source Oral, resp. rate 17, height 5\' 6"  (1.676 m), weight 55.8 kg, SpO2 99%.Body mass index is 19.85 kg/m.  General Appearance: Well Groomed  Eye Contact:  Good  Speech:  Normal  Volume:  Normal  Mood:  Depressed  Affect:  Appropriate  Thought Process:  Coherent  Orientation:  Full (Time, Place, and Person)  Thought Content:  Hallucinations: associated with sleep  Suicidal Thoughts:  No  Homicidal Thoughts:  No  Memory:  Immediate;   Fair  Judgement:  Fair  Insight:  Fair  Psychomotor Activity:  Normal  Concentration:  Concentration: Good and Attention Span: Good  Recall:  Good  Fund of Knowledge:  Good  Language:  Good  Akathisia:  No  Handed:  Right  AIMS (if indicated):     Assets:  Architect Housing Social Support  ADL's:  Intact  Cognition:  Impaired,  Mild  Sleep:   Good      Physical Exam: Physical Exam Vitals and nursing note reviewed.  Constitutional:      Appearance: Normal appearance.  HENT:     Head: Normocephalic.  Pulmonary:     Effort: Pulmonary effort is normal.  Musculoskeletal:     Cervical back: Normal range of motion.  Neurological:     General: No focal deficit present.     Mental Status: She is alert.    Review of Systems  Psychiatric/Behavioral:  Positive for depression and memory loss.   All other systems reviewed and are negative.  Blood pressure (!) 164/61, pulse 89, temperature 98.4 F (36.9 C), temperature source Oral, resp. rate 17, height 5\' 6"  (1.676 m), weight 55.8 kg, SpO2 99%. Body mass index is 19.85 kg/m.  Treatment Plan Summary:  - Continue care w/ Hospitalist Team   -  Return to [facility]    Communicated with Dr Lyn Hollingshead the findings Grief: - Follow-up with PCP for worsening or concerning depression  Disposition: No evidence of imminent risk to self or others at present.   Patient does not meet criteria for psychiatric inpatient admission.  Nanine Means, NP 05/24/2023 1:33 PM

## 2023-05-24 NOTE — Discharge Summary (Signed)
Physician Discharge Summary   Patient: Laurie Harris MRN: [id number]  DOB: 06/17/1941   Admit:     Date of Admission: 05/23/2023 Admitted from: home / assisted living    Discharge: Date of discharge: 05/24/23 Disposition: Assisted living w/ home health  Condition at discharge: good  CODE STATUS: DNR     Discharge Physician: Sunnie Nielsen, DO [facility]     PCP: Glori Luis, MD  Recommendations for Outpatient Follow-up:  Follow up with PCP Glori Luis, MD in 1-2 Please obtain labs/tests: dementia workup, consider repeat TSH/T4 Please follow up on the following pending results: none     Discharge Instructions     Face-to-face encounter (required for Medicare/Medicaid patients)   Complete by: As directed    I Sunnie Nielsen certify that this patient is under my care and that I, or a nurse practitioner or physician's assistant working with me, had a face-to-face encounter that meets the physician face-to-face encounter requirements with this patient on 05/24/2023. The encounter with the patient was in whole, or in part for the following medical condition(s) which is the primary reason for home health care (List medical condition): debility, cognitive impairment   The encounter with the patient was in whole, or in part, for the following medical condition, which is the primary reason for home health care: debility, cognitive impairment   I certify that, based on my findings, the following services are medically necessary home health services: Physical therapy   Reason for Medically Necessary Home Health Services: Therapy- Therapeutic Exercises to Increase Strength and Endurance   My clinical findings support the need for the above services: Cognitive impairments, dementia, or mental confusion  that make it unsafe to leave home   Further, I certify that my clinical findings support that this patient is homebound due to: Mental confusion   Home  Health   Complete by: As directed    To provide the following care/treatments:  PT OT           Discharge Diagnoses: Principal Problem:   AMS (altered mental status) Active Problems:   Grief       Hospital Course: Laurie Harris is a 82 y.o. female with medical history significant of ICH cerebella 2016, CKD stage IIIa, IDDM, HTN, HLD, brought in by family member for evaluation of worsening of slurred speech and unsteady gait. According to family, she was never diagnosed with dementia however her cognition and and ability to do with daily activity has significant decline recently.  2 months ago patient lost her son and since then patient started to develop declining of her cognition with episode of confusion sundowning, and short memory impairment.  Family also described that the patient has frequent episode of visual hallucinations recently.  Meantime, patient also gradually became more unsteady even using a walker which she has been dependent on when waling for last 3 years.  No personality changes denies any depression.  Patient was seen by PCP several weeks ago who suspect patient had a stroke and ordered an MRI which came back negative.  Since then, almost on a weekly basis patient will develop slurred speech. Workup in ED include CT head and MRI brain w/o acute findings, no concerns on labs, EEG diffuse slowing no seizure. Psychiatry evaluation, (+)grief reaction which may be contributing to symptoms, no new medications recommended. Pt stable for discharge w/ home health    Consultants:  Neurology Psychiatry  Procedures: none  ASSESSMENT & PLAN:   Principal Problem:   AMS (altered mental status) Active Problems:   Grief   Dysarthria, recurrent Ataxia, worsening Visual hallucinations Sundowning syndrome Clinically suspect Lewy body dementia Stroke ruled out Discussed with on-call neurology Dr. Jerrell Belfast, recommended TSH B12 and EEG --> no concerns, TSH mild  elevation but T4 normal consider repeat in another few weeks Patient to follow-up with [facility] neurology Dr. Hale Bogus next month for dementia workup   MDMA exposure, question of vs false positive from OTC medications  UDS positive for MDMA, unknown source but phenylephrine can cause false positive  Patient lives in the assisted living and family reported that patient only takes medications ordered by, and she does not take any OTC medications regularly. Clinically, unlikely MDMA exposure close symptoms patient has had   IDDM Resume home medications   Hx of cerebellar hemorrhagic stroke Negative MRI findings x 2 in the last 2 weeks. Continue aspirin and statin        Discharge Instructions  Allergies as of 05/24/2023       Reactions   Warfarin And Related    Possible stroke   Warfarin         Medication List     TAKE these medications    acetaminophen 325 MG tablet Commonly known as: TYLENOL Take 650 mg by mouth every 6 (six) hours as needed.   aspirin EC 81 MG tablet Take 81 mg by mouth daily.   benazepril 5 MG tablet Commonly known as: LOTENSIN TAKE ONE TABLET EVERY DAY   calcium carbonate 600 MG Tabs tablet Commonly known as: OS-CAL Take one tablet by mouth once a day. What changed:  how much to take additional instructions   fenofibrate 160 MG tablet Take 1 tablet (160 mg total) by mouth daily.   HumaLOG Mix 75/25 KwikPen (75-25) 100 UNIT/ML KwikPen Generic drug: Insulin Lispro Prot & Lispro INJECT 14 UNITS SUBCUTANEOUSLY TWICE A DAY WITH A MEAL.   hydrocortisone 2.5 % rectal cream Commonly known as: ANUSOL-HC Place 1 Application rectally 2 (two) times daily.   multivitamin with minerals Tabs tablet Take 1 tablet by mouth daily.   NovoFine Plus 32G X 4 MM Misc Generic drug: Insulin Pen Needle USE TWICE A DAY AS DIRECTED   BD AutoShield Duo 30G X 5 MM Misc Generic drug: Insulin Pen Needle USE TWICE A DAY.   pantoprazole 40 MG  tablet Commonly known as: PROTONIX TAKE 1 TABLET BY MOUTH EVERY-OTHER-DAY What changed: See the new instructions.   rosuvastatin 40 MG tablet Commonly known as: CRESTOR TAKE (1) TABLET BY MOUTH ONCE A DAY What changed: See the new instructions.   Vitamin D3 50 MCG (2000 UT) Tabs Take 2,000 Units by mouth daily.          Allergies  Allergen Reactions   Warfarin And Related     Possible stroke   Warfarin      Subjective: pt reports feels fine and states she does not want to talk to me, will not really answer questions states she wants to go home. Per family, no other concerns at this time    Discharge Exam: BP (!) 164/61 (BP Location: Left Arm)   Pulse 89   Temp 98.4 F (36.9 C) (Oral)   Resp 17   Ht 5\' 6"  (1.676 m)   Wt 55.8 kg   SpO2 99%   BMI 19.85 kg/m  General: Pt is alert, awake, not in acute distress Cardiovascular: RRR, S1/S2 +murmur, no rubs,  no gallops Respiratory: CTA bilaterally, no wheezing, no rhonchi Abdominal: Soft, NT, ND, bowel sounds WNL Extremities: no edema, no cyanosis     The results of significant diagnostics from this hospitalization (including imaging, microbiology, ancillary and laboratory) are listed below for reference.     Microbiology: Recent Results (from the past 240 hour(s))  SARS Coronavirus 2 by RT PCR (hospital order, performed in [facility] hospital lab) *cepheid single result test* Anterior Nasal Swab     Status: None   Collection Time: 05/23/23 11:20 AM   Specimen: Anterior Nasal Swab  Result Value Ref Range Status   SARS Coronavirus 2 by RT PCR NEGATIVE NEGATIVE Final    Comment: (NOTE) SARS-CoV-2 target nucleic acids are NOT DETECTED.  The SARS-CoV-2 RNA is generally detectable in upper and lower respiratory specimens during the acute phase of infection. The lowest concentration of SARS-CoV-2 viral copies this assay can detect is 250 copies / mL. A negative result does not preclude SARS-CoV-2 infection and  should not be used as the sole basis for treatment or other patient management decisions.  A negative result may occur with improper specimen collection / handling, submission of specimen other than nasopharyngeal swab, presence of viral mutation(s) within the areas targeted by this assay, and inadequate number of viral copies (<250 copies / mL). A negative result must be combined with clinical observations, patient history, and epidemiological information.  Fact Sheet for Patients:   RoadLapTop.co.za  Fact Sheet for Healthcare Providers: http://kim-miller.com/  This test is not yet approved or  cleared by the [place] FDA and has been authorized for detection and/or diagnosis of SARS-CoV-2 by FDA under an Emergency Use Authorization (EUA).  This EUA will remain in effect (meaning this test can be used) for the duration of the COVID-19 declaration under Section 564(b)(1) of the Act, 21 U.S.C. section 360bbb-3(b)(1), unless the authorization is terminated or revoked sooner.  Performed at [facility], [address] [place], [place] [address]      Labs: BNP (last 3 results) No results for input(s): "BNP" in the last 8760 hours. Basic Metabolic Panel: Recent Labs  Lab 05/23/23 0901  NA 142  K 3.6  CL 110  CO2 23  GLUCOSE 179*  BUN 31*  CREATININE 1.01*  CALCIUM 9.5   Liver Function Tests: Recent Labs  Lab 05/23/23 0901  AST 53*  ALT 48*  ALKPHOS 135*  BILITOT 0.9  PROT 7.1  ALBUMIN 3.4*   No results for input(s): "LIPASE", "AMYLASE" in the last 168 hours. No results for input(s): "AMMONIA" in the last 168 hours. CBC: Recent Labs  Lab 05/23/23 0901  WBC 4.5  NEUTROABS 3.1  HGB 12.3  HCT 34.7*  MCV 102.4*  PLT 107*   Cardiac Enzymes: No results for input(s): "CKTOTAL", "CKMB", "CKMBINDEX", "TROPONINI" in the last 168 hours. BNP: Invalid input(s): "POCBNP" CBG: Recent Labs  Lab  05/23/23 0905 05/23/23 1739 05/23/23 2207 05/24/23 0730 05/24/23 1134  GLUCAP 160* 86 217* 126* 193*   D-Dimer No results for input(s): "DDIMER" in the last 72 hours. Hgb A1c No results for input(s): "HGBA1C" in the last 72 hours. Lipid Profile No results for input(s): "CHOL", "HDL", "LDLCALC", "TRIG", "CHOLHDL", "LDLDIRECT" in the last 72 hours. Thyroid function studies Recent Labs    05/23/23 0901  TSH 6.150*   Anemia work up No results for input(s): "VITAMINB12", "FOLATE", "FERRITIN", "TIBC", "IRON", "RETICCTPCT" in the last 72 hours. Urinalysis    Component Value Date/Time   COLORURINE YELLOW (A)  05/23/2023 1159   APPEARANCEUR HAZY (A) 05/23/2023 1159   APPEARANCEUR Clear 12/31/2014 1822   LABSPEC 1.017 05/23/2023 1159   LABSPEC 1.009 12/31/2014 1822   PHURINE 5.0 05/23/2023 1159   GLUCOSEU NEGATIVE 05/23/2023 1159   GLUCOSEU >=500 12/31/2014 1822   HGBUR MODERATE (A) 05/23/2023 1159   BILIRUBINUR NEGATIVE 05/23/2023 1159   BILIRUBINUR neg 05/10/2023 1358   BILIRUBINUR Negative 12/31/2014 1822   KETONESUR NEGATIVE 05/23/2023 1159   PROTEINUR NEGATIVE 05/23/2023 1159   UROBILINOGEN 0.2 05/10/2023 1358   NITRITE NEGATIVE 05/23/2023 1159   LEUKOCYTESUR NEGATIVE 05/23/2023 1159   LEUKOCYTESUR Negative 12/31/2014 1822   Sepsis Labs Recent Labs  Lab 05/23/23 0901  WBC 4.5   Microbiology Recent Results (from the past 240 hour(s))  SARS Coronavirus 2 by RT PCR (hospital order, performed in [facility] hospital lab) *cepheid single result test* Anterior Nasal Swab     Status: None   Collection Time: 05/23/23 11:20 AM   Specimen: Anterior Nasal Swab  Result Value Ref Range Status   SARS Coronavirus 2 by RT PCR NEGATIVE NEGATIVE Final    Comment: (NOTE) SARS-CoV-2 target nucleic acids are NOT DETECTED.  The SARS-CoV-2 RNA is generally detectable in upper and lower respiratory specimens during the acute phase of infection. The lowest concentration of SARS-CoV-2  viral copies this assay can detect is 250 copies / mL. A negative result does not preclude SARS-CoV-2 infection and should not be used as the sole basis for treatment or other patient management decisions.  A negative result may occur with improper specimen collection / handling, submission of specimen other than nasopharyngeal swab, presence of viral mutation(s) within the areas targeted by this assay, and inadequate number of viral copies (<250 copies / mL). A negative result must be combined with clinical observations, patient history, and epidemiological information.  Fact Sheet for Patients:   RoadLapTop.co.za  Fact Sheet for Healthcare Providers: http://kim-miller.com/  This test is not yet approved or  cleared by the [place] FDA and has been authorized for detection and/or diagnosis of SARS-CoV-2 by FDA under an Emergency Use Authorization (EUA).  This EUA will remain in effect (meaning this test can be used) for the duration of the COVID-19 declaration under Section 564(b)(1) of the Act, 21 U.S.C. section 360bbb-3(b)(1), unless the authorization is terminated or revoked sooner.  Performed at [facility], [address] [place], [place] [address]    Imaging EEG adult  Result Date: 05/24/2023 Charlsie Quest, MD     05/24/2023  3:19 PM Patient Name: SHERENA SCHWERTNER MRN: [id number] Epilepsy Attending: Charlsie Quest Referring Physician/Provider: Emeline General, MD Date: 05/24/2023 Duration: 30.23 mins Patient history: 82 year old female with altered mental status.  EEG to alert for seizure. Level of alertness: Awake, asleep AEDs during EEG study: None Technical aspects: This EEG study was done with scalp electrodes positioned according to the 10-20 International system of electrode placement. Electrical activity was reviewed with band pass filter of 1-70Hz , sensitivity of 7 uV/mm, display speed of 23mm/sec with a 60Hz   notched filter applied as appropriate. EEG data were recorded continuously and digitally stored.  Video monitoring was available and reviewed as appropriate. Description: The posterior dominant rhythm consists of 8-9 Hz activity of moderate voltage (25-35 uV) seen predominantly in posterior head regions, symmetric and reactive to eye opening and eye closing. Sleep was characterized by vertex waves, sleep spindles (12 to 14 Hz), maximal frontocentral region. EEG showed intermittent generalized 3 to 6 Hz theta-delta slowing.  Hyperventilation and photic stimulation were not performed.   ABNORMALITY - Intermittent slow, generalized IMPRESSION: This study is suggestive of mild diffuse encephalopathy. No seizures or epileptiform discharges were seen throughout the recording. Charlsie Quest   MR BRAIN WO CONTRAST  Result Date: 05/23/2023 CLINICAL DATA:  Neuro deficit, acute, stroke suspected EXAM: MRI HEAD WITHOUT CONTRAST TECHNIQUE: Multiplanar, multiecho pulse sequences of the brain and surrounding structures were obtained without intravenous contrast. COMPARISON:  Same-day CT head, brain MR 05/03/2023 FINDINGS: Brain: Negative for an acute infarct. No hemorrhage. No hydrocephalus. No extra-axial fluid collection. Mass effect. No mass lesion. Chronic pontine and left cerebellar hemorrhages. There is sequela of mild overall chronic microvascular ischemic change. Vascular: Normal flow voids. Skull and upper cervical spine: Degenerative changes in the upper cervical spine. Sinuses/Orbits: No middle ear or mastoid effusion. Paranasal sinuses are clear. Bilateral lens replacement. Orbits are otherwise unremarkable. Other: None. IMPRESSION: No acute intracranial process. Electronically Signed   By: Lorenza Cambridge M.D.   On: 05/23/2023 15:22   CT HEAD WO CONTRAST ( )  Result Date: 05/23/2023 CLINICAL DATA:  Neuro deficit, acute, stroke suspected EXAM: CT HEAD WITHOUT CONTRAST TECHNIQUE: Contiguous axial images were  obtained from the base of the skull through the vertex without intravenous contrast. RADIATION DOSE REDUCTION: This exam was performed according to the departmental dose-optimization program which includes automated exposure control, adjustment of the mA and/or kV according to patient size and/or use of iterative reconstruction technique. COMPARISON:  CT Head 05/10/23 FINDINGS: Brain: No hemorrhage. No hydrocephalus. No extra-axial fluid collection. No CT evidence of an acute infarct. Sequela of mild chronic microvascular ischemic change. Generalized volume loss. Vascular: No hyperdense vessel or unexpected calcification. Skull: Normal. Negative for fracture or focal lesion. Sinuses/Orbits: No middle ear or mastoid effusion. Paranasal sinuses are clear. There are osseous changes suggestive of chronic left maxillary sinusitis. Bilateral lens replacement. Orbits are otherwise unremarkable. Other: None. IMPRESSION: No hemorrhage or CT evidence of an acute cortical infarct. Electronically Signed   By: Lorenza Cambridge M.D.   On: 05/23/2023 12:14      Time coordinating discharge: over 30 minutes  SIGNED:  Sunnie Nielsen DO [facility]

## 2023-05-24 NOTE — ED Notes (Signed)
Bair hugger applied.

## 2023-05-24 NOTE — ED Notes (Signed)
Patient assisted to the [facility] for BM. Patient cleaned and assisted back in the bed without incident.

## 2023-05-24 NOTE — Progress Notes (Signed)
Eeg done

## 2023-05-24 NOTE — Evaluation (Signed)
Physical Therapy Evaluation Patient Details Name: Laurie Harris MRN: [id number] DOB: [birth year] Today's Date: 05/24/2023  History of Present Illness  Patient is a 82 y.o. female with medical history significant of ICH cerebella 2016, CKD stage IIIa, IDDM, HTN, HLD, brought in by family member for evaluation of worsening of slurred speech and unsteady gait. Current MD assessment includes: Altered mental status, Dysarthria, ataxia, visual hallucinations, and sundowning.  Clinical Impression  Pt was pleasant and motivated to participate during the session and put forth good effort throughout. Pt initially needing light redirections to stay on task, but was able to follow one step commands throughout the rest of the session.  Oriented to self, believed she was at [facility]. She is supervision for bed mobility, needing increased time to complete supine to sit, able to adjust herself once seated. She was able to stand from lowered EOB with CGA, cues for hand placement. Able to walk short ambulatory bout with RW and CGA, needing intermittent cues for hallway navigation, but otherwise walking with stable step pattern at a decreased speed. Pt will benefit from continued PT services upon discharge to safely address deficits listed in patient problem list for decreased caregiver assistance and eventual return to PLOF.          If plan is discharge home, recommend the following: A little help with walking and/or transfers;A little help with bathing/dressing/bathroom;Direct supervision/assist for medications management;Assist for transportation;Assistance with cooking/housework   Can travel by private vehicle        Equipment Recommendations None recommended by PT  Recommendations for Other Services       Functional Status Assessment Patient has had a recent decline in their functional status and demonstrates the ability to make significant improvements in function in a reasonable and predictable  amount of time.     Precautions / Restrictions Precautions Precautions: Fall Restrictions Weight Bearing Restrictions: No      Mobility  Bed Mobility Overal bed mobility: Needs Assistance Bed Mobility: Supine to Sit, Sit to Supine     Supine to sit: Supervision Sit to supine: Min assist   General bed mobility comments: Able to sit up EOB with supervision, needed min A for LE's to get back in bed    Transfers Overall transfer level: Needs assistance Equipment used: Rolling walker (2 wheels) Transfers: Sit to/from Stand Sit to Stand: Contact guard assist           General transfer comment: Pt able to stand from lowered bed surface with no physical asssit.    Ambulation/Gait Ambulation/Gait assistance: Contact guard assist Gait Distance (Feet): 60 Feet Assistive device: Rolling walker (2 wheels) Gait Pattern/deviations: Step-through pattern, Trunk flexed, Decreased step length - right, Decreased step length - left, Decreased stride length Gait velocity: decreased     General Gait Details: generally slow, pt generally steady with no true imbalance seen. although pt reports "feeling shaky" when walking, not notably impacting gait  Stairs            Wheelchair Mobility     Tilt Bed    Modified Rankin (Stroke Patients Only)       Balance Overall balance assessment: Needs assistance Sitting-balance support: Feet supported Sitting balance-Leahy Scale: Good     Standing balance support: Bilateral upper extremity supported, During functional activity Standing balance-Leahy Scale: Fair Standing balance comment: static standing with RW  Pertinent Vitals/Pain Pain Assessment Pain Assessment: Faces Faces Pain Scale: No hurt    Home Living Family/patient expects to be discharged to:: Assisted living                 Home Equipment: Rollator (4 wheels) Additional Comments: Pt resident at [facility]     Prior Function Prior Level of Function : Independent/Modified Independent             Mobility Comments: Able to walk by herself with rollator to/from dining hall ADLs Comments: reports assistance for bathing     Extremity/Trunk Assessment   Upper Extremity Assessment Upper Extremity Assessment: Generalized weakness    Lower Extremity Assessment Lower Extremity Assessment: Generalized weakness       Communication   Communication Communication: No apparent difficulties  Cognition Arousal: Alert Behavior During Therapy: WFL for tasks assessed/performed Overall Cognitive Status: History of cognitive impairments - at baseline                                          General Comments      Exercises     Assessment/Plan    PT Assessment Patient needs continued PT services  PT Problem List Decreased strength;Decreased coordination;Decreased range of motion;Decreased activity tolerance;Decreased balance;Decreased mobility       PT Treatment Interventions Balance training;DME instruction;Gait training;Stair training;Functional mobility training;Therapeutic activities;Therapeutic exercise    PT Goals (Current goals can be found in the Care Plan section)  Acute Rehab PT Goals Patient Stated Goal: get back home (to [employer] [employer]) PT Goal Formulation: With patient Time For Goal Achievement: 06/06/23 Potential to Achieve Goals: Good    Frequency Min 1X/week     Co-evaluation               AM-PAC PT "6 Clicks" Mobility  Outcome Measure Help needed turning from your back to your side while in a flat bed without using bedrails?: A Little Help needed moving from lying on your back to sitting on the side of a flat bed without using bedrails?: A Little Help needed moving to and from a bed to a chair (including a wheelchair)?: A Little Help needed standing up from a chair using your arms (e.g., wheelchair or bedside chair)?: A Little Help needed to  walk in hospital room?: A Little Help needed climbing 3-5 steps with a railing? : A Little 6 Click Score: 18    End of Session Equipment Utilized During Treatment: Gait belt Activity Tolerance: Patient tolerated treatment well Patient left: in bed;with bed alarm set;with call bell/phone within reach Nurse Communication: Mobility status PT Visit Diagnosis: Other abnormalities of gait and mobility (R26.89);Difficulty in walking, not elsewhere classified (R26.2);Unsteadiness on feet (R26.81);Muscle weakness (generalized) (M62.81)    Time: 1100-1119 PT Time Calculation (min) (ACUTE ONLY): 19 min   Charges:                 Cecile Sheerer, SPT 05/24/23, 1:22 PM

## 2023-05-24 NOTE — ED Triage Notes (Signed)
Patient comes from [facility] by EMS.  Patient was seen here yesterday for stroke symptoms.  Patient was sent back to the facility this morning, where she has experienced  6 - 7 falls, and seems more confused than usual.

## 2023-05-24 NOTE — ED Provider Notes (Signed)
[facility] Provider Note    Event Date/Time   First MD Initiated Contact with Patient 05/24/23 2310     (approximate)   History   Fall   HPI  Level V caveat: Limited by altered mental state  Laurie Harris is a 82 y.o. female brought to the ED via EMS from [facility] for a chief complaint of altered mental status.  Patient was seen in the ED yesterday for strokelike symptoms.  Staff reports 6-7 falls since patient returned to the facility and patient seems more confused than usual.  Arrives to the ED with eyes wide open, staring but responsive.  Staring is new x 2 days per facility staff.  Patient denies vision changes, headache, neck pain, chest pain, shortness of breath, abdominal pain, nausea, vomiting or dizziness.     Past Medical History   Past Medical History:  Diagnosis Date   Carotid arterial disease (HCC)    a. 09/2016 Carotid U/S: R carotid bifurcation dzs of 50-69%, L carotid bifurcation dzs of <50%. Patent vertebral arteries w/ anegrade flow.   Cerebellar hemorrhage (HCC) 03/16/2015   Left Cerebellar Hemorrhage- See Care Everywhere Seen by [facility]     Chicken pox    CKD (chronic kidney disease), stage III (HCC)    functioning at 46%   Diabetes mellitus without complication (HCC)    Hemorrhagic stroke (HCC) 12/31/2014   a. in the setting of warfarin therapy.   History of appendicitis 08/07/2016   Hyperlipidemia    Hypertension    Syncope    a. 09/2016 Echo: EF 55-60%, no rwma, Gr1 DD.     Active Problem List   Patient Active Problem List   Diagnosis Date Noted   Acute metabolic encephalopathy 05/25/2023   Fall 05/25/2023   Myocardial injury 05/25/2023   SIRS (systemic inflammatory response syndrome) (HCC) 05/25/2023   Thrombocytopenia (HCC) 05/25/2023   AMS (altered mental status) 05/23/2023   Hallucination 05/12/2023   Slurred speech 05/03/2023   Hospital discharge follow-up 07/20/2022   Appendicitis 06/09/2022    Acute abdominal pain 06/08/2022   Acute appendicitis 06/08/2022   Stage 3a chronic kidney disease (CKD) (HCC) 06/08/2022   Acute perforated appendicitis 06/08/2022   Bright red blood per rectum 12/19/2021   Bruising 12/19/2021   Unwitnessed fall 12/19/2021   Thoracic back pain 11/03/2021   Diabetic retinopathy (HCC) 09/29/2021   Depression, major, single episode, mild (HCC) 07/05/2020   GERD (gastroesophageal reflux disease) 11/11/2019   Memory difficulty 10/15/2019   Grief 07/10/2019   Hirsutism 10/29/2018   Constipation 07/29/2018   Osteoporosis 12/26/2017   Aorto-iliac atherosclerosis (HCC) 09/20/2017   Macrocytosis without anemia 09/20/2017   Carotid artery disease (HCC) 06/19/2016   Acute renal failure superimposed on stage 3a chronic kidney disease (HCC) 05/10/2016   Type II diabetes mellitus with renal manifestations (HCC) 03/16/2015   Benign essential HTN 03/16/2015   Hyperlipidemia 03/16/2015   Carotid artery occlusion without infarction 08/18/2013     Past Surgical History   Past Surgical History:  Procedure Laterality Date   ABCESS DRAINAGE  06/14/2016   appendix   APPENDECTOMY  06/13/2016   CATARACT EXTRACTION W/PHACO Left 09/04/2017   Procedure: CATARACT EXTRACTION PHACO AND INTRAOCULAR LENS PLACEMENT (IOC) COMPLICATED LEFT DIABETIC;  Surgeon: Lockie Mola, MD;  Location: [facility];  Service: Ophthalmology;  Laterality: Left;  Diabetic - insulin   CATARACT EXTRACTION W/PHACO Right 10/02/2017   Procedure: CATARACT EXTRACTION PHACO AND INTRAOCULAR LENS PLACEMENT (IOC) COMPLICATED  RIGHT DIABETIC;  Surgeon: Lockie Mola, MD;  Location: [facility];  Service: Ophthalmology;  Laterality: Right;  Diabetic - insulin   CHOLECYSTECTOMY N/A 06/17/2018   Procedure: LAPAROSCOPIC CHOLECYSTECTOMY;  Surgeon: Lucendia Leard Amabile, DO;  Location: [facility];  Service: General;  Laterality: N/A;   COLONOSCOPY     LAPAROSCOPIC APPENDECTOMY N/A 06/08/2022    Procedure: APPENDECTOMY LAPAROSCOPIC;  Surgeon: Henrene Dodge, MD;  Location: [facility];  Service: General;  Laterality: N/A;   NECK SURGERY       Home Medications   Prior to Admission medications   Medication Sig Start Date End Date Taking? Authorizing Provider  aspirin EC 81 MG tablet Take 81 mg by mouth daily.   Yes [provider]  Cholecalciferol (VITAMIN D3) 50 MCG (2000 UT) TABS Take 2,000 Units by mouth daily.   Yes [provider]  fenofibrate 160 MG tablet Take 1 tablet (160 mg total) by mouth daily. 02/03/20  Yes Glori Luis, MD  HUMALOG MIX 75/25 KWIKPEN (75-25) 100 UNIT/ML KwikPen INJECT 14 UNITS SUBCUTANEOUSLY TWICE A DAY WITH A MEAL. 09/24/22  Yes Glori Luis, MD  Oyster Shell 500 MG TABS Take 1 tablet by mouth daily.   Yes [provider]  pantoprazole (PROTONIX) 40 MG tablet TAKE 1 TABLET BY MOUTH EVERY-OTHER-DAY Patient taking differently: Take 40 mg by mouth every other day. 04/25/20  Yes Glori Luis, MD  rosuvastatin (CRESTOR) 40 MG tablet TAKE (1) TABLET BY MOUTH ONCE A DAY Patient taking differently: Take 40 mg by mouth daily. 01/10/21  Yes Glori Luis, MD  acetaminophen (TYLENOL) 325 MG tablet Take 650 mg by mouth every 6 (six) hours as needed.    [provider]  BD AUTOSHIELD DUO 30G X 5 MM MISC USE TWICE A DAY. 10/04/22   Glori Luis, MD  benazepril (LOTENSIN) 5 MG tablet TAKE ONE TABLET EVERY DAY Patient taking differently: Take 5 mg by mouth daily. 05/12/19   Glori Luis, MD  calcium carbonate (OS-CAL) 600 MG TABS tablet Take one tablet by mouth once a day. Patient not taking: Reported on 05/25/2023 12/01/20   Glori Luis, MD  hydrocortisone (ANUSOL-HC) 2.5 % rectal cream Place 1 Application rectally 2 (two) times daily. 05/03/22   Toney Reil, MD  Multiple Vitamin (MULTIVITAMIN WITH MINERALS) TABS tablet Take 1 tablet by mouth daily. Patient not taking: Reported on 05/23/2023     [provider]  NOVOFINE PLUS 32G X 4 MM MISC USE TWICE A DAY AS DIRECTED 02/22/20   Glori Luis, MD     Allergies  Warfarin and related and Warfarin   Family History   Family History  Problem Relation Age of Onset   Diabetes Mother    Dementia Mother    Heart disease Mother    Cancer Mother        lung   Cancer Father        colon   Stroke Father    Stroke Daughter    Hyperlipidemia Daughter    Breast cancer Neg Hx      Physical Exam  Triage Vital Signs: ED Triage Vitals  Encounter Vitals Group     BP      Systolic BP Percentile      Diastolic BP Percentile      Pulse      Resp      Temp      Temp src      SpO2  Weight      Height      Head Circumference      Peak Flow      Pain Score      Pain Loc      Pain Education      Exclude from Growth Chart     Updated Vital Signs: BP (!) 164/82 (BP Location: Left Arm)   Pulse (!) 104   Temp (!) 96.2 F (35.7 C) (Axillary)   Resp 19   SpO2 98%    General: Awake, no distress.  Eyes open, staring but responsive. CV:  Tachycardic.  Good peripheral perfusion.  Resp:  Normal effort.  CTAB. Abd:  Nontender.  No distention.  Other:  Scattered old ecchymosis, notably to right forehead and left forearm.  Alert and oriented to person and place.  CN II-XII grossly intact.  5/5 motor strength and sensation all extremities.  MAE x 4.   ED Results / Procedures / Treatments  Labs (all labs ordered are listed, but only abnormal results are displayed) Labs Reviewed  CBC WITH DIFFERENTIAL/PLATELET - Abnormal; Notable for the following components:      Result Value   RBC 3.64 (*)    MCV 101.6 (*)    MCH 34.3 (*)    Platelets 108 (*)    All other components within normal limits  COMPREHENSIVE METABOLIC PANEL - Abnormal; Notable for the following components:   CO2 21 (*)    Glucose, Bld 164 (*)    BUN 30 (*)    AST 57 (*)    ALT 49 (*)    Alkaline Phosphatase 128 (*)    Anion gap 16 (*)     All other components within normal limits  BRAIN NATRIURETIC PEPTIDE - Abnormal; Notable for the following components:   B Natriuretic Peptide 117.2 (*)    All other components within normal limits  LACTIC ACID, PLASMA - Abnormal; Notable for the following components:   Lactic Acid, Venous 2.5 (*)    All other components within normal limits  URINALYSIS, W/ REFLEX TO CULTURE (INFECTION SUSPECTED) - Abnormal; Notable for the following components:   Color, Urine YELLOW (*)    APPearance HAZY (*)    Protein, ur 30 (*)    Bacteria, UA RARE (*)    All other components within normal limits  PROTIME-INR - Abnormal; Notable for the following components:   Prothrombin Time 15.9 (*)    All other components within normal limits  APTT - Abnormal; Notable for the following components:   aPTT 38 (*)    All other components within normal limits  BASIC METABOLIC PANEL - Abnormal; Notable for the following components:   Glucose, Bld 100 (*)    BUN 30 (*)    All other components within normal limits  CBC - Abnormal; Notable for the following components:   RBC 3.49 (*)    Hemoglobin 11.6 (*)    HCT 33.6 (*)    Platelets 101 (*)    All other components within normal limits  CBG MONITORING, ED - Abnormal; Notable for the following components:   Glucose-Capillary 140 (*)    All other components within normal limits  TROPONIN I (HIGH SENSITIVITY) - Abnormal; Notable for the following components:   Troponin I (High Sensitivity) 60 (*)    All other components within normal limits  TROPONIN I (HIGH SENSITIVITY) - Abnormal; Notable for the following components:   Troponin I (High Sensitivity) 138 (*)    All other components  within normal limits  TROPONIN I (HIGH SENSITIVITY) - Abnormal; Notable for the following components:   Troponin I (High Sensitivity) 1,176 (*)    All other components within normal limits  RESP PANEL BY RT-PCR (RSV, FLU A&B, COVID)  [id number]  CULTURE, BLOOD (ROUTINE X 2)  CULTURE,  BLOOD (ROUTINE X 2)  LACTIC ACID, PLASMA  PROCALCITONIN  HEMOGLOBIN A1C     EKG  ED ECG REPORT I, Xenia Nile J, the attending physician, personally viewed and interpreted this ECG.   Date: 05/24/2023  EKG Time: 2324  Rate: 109  Rhythm: sinus tachycardia  Axis: LAD  Intervals:none  ST&T Change: Nonspecific    RADIOLOGY I have independently visualized interpreted patient's imaging studies as well as noted the radiology interpretation:  Chest x-ray: No acute cardiopulmonary process  Pelvis x-ray: No acute osseous abnormality  CT head: No ICH  Official radiology report(s): CT Head Wo Contrast  Result Date: 05/25/2023 CLINICAL DATA:  Mental status change, unknown cause. Seen here 05/23/2023 for left-sided stroke symptoms but had a negative brain MRI. EXAM: CT HEAD WITHOUT CONTRAST TECHNIQUE: Contiguous axial images were obtained from the base of the skull through the vertex without intravenous contrast. RADIATION DOSE REDUCTION: This exam was performed according to the departmental dose-optimization program which includes automated exposure control, adjustment of the mA and/or kV according to patient size and/or use of iterative reconstruction technique. COMPARISON:  Head CT and MRI brain both 05/23/2023, most recent was the MRI 1:00 p.m. FINDINGS: Brain: There is mild to moderate global atrophy with atrophic ventriculomegaly and mild small vessel disease of the cerebral white matter with chronic mineralization in the basal ganglia. No new asymmetry is seen concerning for a cortical based acute infarct, hemorrhage, mass or mass effect. There is no midline shift. The basal cisterns are clear. Vascular: Both carotid siphons and distal vertebral arteries are heavily calcified. No hyperdense central vessel is seen. Skull: Negative for fractures or focal lesions, but there is a new finding of a small right lateral frontal scalp contusion, not seen previously. Sinuses/Orbits: Clear sinuses and  mastoids. Reverse S shaped nasal septum with right-sided spurring. Old lens extractions with otherwise negative orbits. Other: None. IMPRESSION: 1. No acute intracranial CT findings or interval changes. 2. Atrophy with small-vessel disease. 3. Small right lateral frontal scalp contusion, was not seen on 05/23/2023. There is no depressed skull fracture. Electronically Signed   By: Almira Bar M.D.   On: 05/25/2023 00:57   DG Chest Port 1 View  Result Date: 05/25/2023 CLINICAL DATA:  Altered mental status with multiple fall EXAM: PORTABLE CHEST 1 VIEW COMPARISON:  09/25/2022 FINDINGS: The heart size and mediastinal contours are within normal limits. Both lungs are clear. The visualized skeletal structures are unremarkable. IMPRESSION: No active disease. Electronically Signed   By: Jasmine Pang M.D.   On: 05/25/2023 00:15   DG Pelvis Portable  Result Date: 05/25/2023 CLINICAL DATA:  Altered mental status multiple fall EXAM: PORTABLE PELVIS 1-2 VIEWS COMPARISON:  CT 06/18/2022 FINDINGS: SI joints are non widened. Pubic symphysis and rami appear intact. Bilateral hip degenerative change. No fracture or malalignment IMPRESSION: No acute osseous abnormality. Electronically Signed   By: Jasmine Pang M.D.   On: 05/25/2023 00:14   EEG adult  Result Date: 05/24/2023 Charlsie Quest, MD     05/24/2023  3:19 PM Patient Name: KANDA ELRICK MRN: [id number] Epilepsy Attending: Charlsie Quest Referring Physician/Provider: Emeline General, MD Date: 05/24/2023 Duration: 30.23 mins Patient history: 82 year old female  with altered mental status.  EEG to alert for seizure. Level of alertness: Awake, asleep AEDs during EEG study: None Technical aspects: This EEG study was done with scalp electrodes positioned according to the 10-20 International system of electrode placement. Electrical activity was reviewed with band pass filter of 1-70Hz , sensitivity of 7 uV/mm, display speed of 22mm/sec with a 60Hz  notched filter  applied as appropriate. EEG data were recorded continuously and digitally stored.  Video monitoring was available and reviewed as appropriate. Description: The posterior dominant rhythm consists of 8-9 Hz activity of moderate voltage (25-35 uV) seen predominantly in posterior head regions, symmetric and reactive to eye opening and eye closing. Sleep was characterized by vertex waves, sleep spindles (12 to 14 Hz), maximal frontocentral region. EEG showed intermittent generalized 3 to 6 Hz theta-delta slowing. Hyperventilation and photic stimulation were not performed.   ABNORMALITY - Intermittent slow, generalized IMPRESSION: This study is suggestive of mild diffuse encephalopathy. No seizures or epileptiform discharges were seen throughout the recording. Priyanka Annabelle Harman     PROCEDURES:  Critical Care performed: Yes, see critical care procedure note(s)  CRITICAL CARE Performed by: Irean Hong   Total critical care time: 30 minutes  Critical care time was exclusive of separately billable procedures and treating other patients.  Critical care was necessary to treat or prevent imminent or life-threatening deterioration.  Critical care was time spent personally by me on the following activities: development of treatment plan with patient and/or surrogate as well as nursing, discussions with consultants, evaluation of patient's response to treatment, examination of patient, obtaining history from patient or surrogate, ordering and performing treatments and interventions, ordering and review of laboratory studies, ordering and review of radiographic studies, pulse oximetry and re-evaluation of patient's condition.   Marland Kitchen1-3 Lead EKG Interpretation  Performed by: Irean Hong, MD Authorized by: Irean Hong, MD     Interpretation: abnormal     ECG rate:  110   ECG rate assessment: tachycardic     Rhythm: sinus tachycardia     Ectopy: none     Conduction: normal   Comments:     Patient placed on  cardiac monitor to evaluate for arrhythmias    MEDICATIONS ORDERED IN ED: Medications  vancomycin (VANCOREADY) IVPB 1250 mg/250 mL (1,250 mg Intravenous New Bag/Given 05/25/23 0424)  0.9 %  sodium chloride infusion ( Intravenous New Bag/Given 05/25/23 0310)  insulin aspart (novoLOG) injection 0-9 Units (has no administration in time range)  insulin aspart (novoLOG) injection 0-5 Units ( Subcutaneous Not Given 05/25/23 0244)  aspirin EC tablet 81 mg (has no administration in time range)  fenofibrate tablet 160 mg (has no administration in time range)  rosuvastatin (CRESTOR) tablet 40 mg (has no administration in time range)  insulin aspart protamine- aspart (NOVOLOG MIX 70/30) injection 10 Units (has no administration in time range)  pantoprazole (PROTONIX) EC tablet 40 mg (has no administration in time range)  hydrALAZINE (APRESOLINE) injection 5 mg (has no administration in time range)  acetaminophen (TYLENOL) suppository 650 mg (has no administration in time range)  acetaminophen (TYLENOL) tablet 650 mg (has no administration in time range)  ondansetron (ZOFRAN) injection 4 mg (has no administration in time range)  ceFEPIme (MAXIPIME) 2 g in sodium chloride 0.9 % 100 mL IVPB (2 g Intravenous New Bag/Given 05/25/23 0321)  vancomycin (VANCOREADY) IVPB 750 mg/150 mL (has no administration in time range)  lactated ringers bolus 1,000 mL (0 mLs Intravenous Stopped 05/25/23 0244)    And  lactated  ringers bolus 500 mL (500 mLs Intravenous New Bag/Given 05/25/23 0253)    And  lactated ringers bolus 250 mL (250 mLs Intravenous New Bag/Given 05/25/23 0327)  metroNIDAZOLE (FLAGYL) IVPB 500 mg (0 mg Intravenous Stopped 05/25/23 0244)     IMPRESSION / MDM / ASSESSMENT AND PLAN / ED COURSE  I reviewed the triage vital signs and the nursing notes.                             82 year old female who presents with recurrent falls and altered mentation since ED evaluation yesterday for strokelike symptoms.  Differential diagnosis includes, but is not limited to, alcohol, illicit or prescription medications, or other toxic ingestion; intracranial pathology such as stroke or intracerebral hemorrhage; fever or infectious causes including sepsis; hypoxemia and/or hypercarbia; uremia; trauma; endocrine related disorders such as diabetes, hypoglycemia, and thyroid-related diseases; hypertensive encephalopathy; etc. I personally reviewed patient's records and note an oncology office visit for thrombocytopenia on 05/16/2023.  I have also noticed an EEG dated today suggestive of mild diffuse encephalopathy from her recent hospitalization 9/19 - 05/24/2023.  Patient's presentation is most consistent with acute presentation with potential threat to life or bodily function.  The patient is on the cardiac monitor to evaluate for evidence of arrhythmia and/or significant heart rate changes.  Will obtain sepsis workup, CT head, chest and pelvis x-rays.  Anticipate hospitalization.  Clinical Course as of 05/25/23 0541  Sat May 25, 2023  0024 Lactic acid 2.5, will initiate ED Code Sepsis protocol and administer 30 cc/kilo IV fluids.  Laboratory results remarkable for mildly elevated anion gap, mild elevation of troponin and BNP.  Plain film imaging negative.  CT head negative for ICH.  Will consult hospital services for evaluation and admission. [JS]    Clinical Course User Index [JS] Irean Hong, MD     FINAL CLINICAL IMPRESSION(S) / ED DIAGNOSES   Final diagnoses:  Recurrent falls  Altered mental status, unspecified altered mental status type  Hypothermia, initial encounter  Encephalopathy  Sepsis, due to unspecified organism, unspecified whether acute organ dysfunction present [facility])     Rx / DC Orders   ED Discharge Orders     None        Note:  This document was prepared using Dragon voice recognition software and may include unintentional dictation errors.   Irean Hong, MD 05/25/23  [phone]

## 2023-05-24 NOTE — Procedures (Signed)
Patient Name: NIYATHI BHULLAR  MRN: [id number]  Epilepsy Attending: Charlsie Quest  Referring Physician/Provider: Emeline General, MD  Date: 05/24/2023 Duration: 30.23 mins  Patient history: 82 year old female with altered mental status.  EEG to alert for seizure.  Level of alertness: Awake, asleep  AEDs during EEG study: None  Technical aspects: This EEG study was done with scalp electrodes positioned according to the 10-20 International system of electrode placement. Electrical activity was reviewed with band pass filter of 1-70Hz , sensitivity of 7 uV/mm, display speed of 14mm/sec with a 60Hz  notched filter applied as appropriate. EEG data were recorded continuously and digitally stored.  Video monitoring was available and reviewed as appropriate.  Description: The posterior dominant rhythm consists of 8-9 Hz activity of moderate voltage (25-35 uV) seen predominantly in posterior head regions, symmetric and reactive to eye opening and eye closing. Sleep was characterized by vertex waves, sleep spindles (12 to 14 Hz), maximal frontocentral region. EEG showed intermittent generalized 3 to 6 Hz theta-delta slowing. Hyperventilation and photic stimulation were not performed.     ABNORMALITY - Intermittent slow, generalized  IMPRESSION: This study is suggestive of mild diffuse encephalopathy. No seizures or epileptiform discharges were seen throughout the recording.  Corran Lalone Annabelle Harman

## 2023-05-24 NOTE — [place] FL2 (Addendum)
[place] MEDICAID FL2 LEVEL OF CARE FORM     IDENTIFICATION  Patient Name: Laurie Harris Birthdate: 1940-09-21 Sex: female Admission Date (Current Location): 05/23/2023  [facility] and [place] Number:  Chiropodist and Address:  [facility], [address] [place], [place] [address]      Provider Number: [id number]  Attending Physician Name and Address:  Sunnie Nielsen, DO  Relative Name and Phone Number:  Nyra Jabs (Daughter)  [phone] [facility])    Current Level of Care: Hospital Recommended Level of Care: Assisted Living Facility Prior Approval Number:    Date Approved/Denied:   PASRR Number:    Discharge Plan:      Current Diagnoses: Patient Active Problem List   Diagnosis Date Noted   AMS (altered mental status) 05/23/2023   Hallucination 05/12/2023   Slurred speech 05/03/2023   Hospital discharge follow-up 07/20/2022   Appendicitis 06/09/2022   Acute abdominal pain 06/08/2022   Acute appendicitis 06/08/2022   Stage 3a chronic kidney disease (CKD) (HCC) 06/08/2022   Acute perforated appendicitis 06/08/2022   Bright red blood per rectum 12/19/2021   Bruising 12/19/2021   Unwitnessed fall 12/19/2021   Thoracic back pain 11/03/2021   Diabetic retinopathy (HCC) 09/29/2021   Depression, major, single episode, mild (HCC) 07/05/2020   GERD (gastroesophageal reflux disease) 11/11/2019   Memory difficulty 10/15/2019   Grief 07/10/2019   Hirsutism 10/29/2018   Constipation 07/29/2018   Osteoporosis 12/26/2017   Aorto-iliac atherosclerosis (HCC) 09/20/2017   Macrocytosis without anemia 09/20/2017   Carotid artery disease (HCC) 06/19/2016   Acute renal failure superimposed on stage 3a chronic kidney disease (HCC) 05/10/2016   Type II diabetes mellitus with renal manifestations (HCC) 03/16/2015   Benign essential HTN 03/16/2015   Hyperlipidemia 03/16/2015   Carotid artery occlusion without infarction 08/18/2013     Orientation RESPIRATION BLADDER Height & Weight     Self, Time, Situation, Place  Normal   Weight: 123 lb (55.8 kg) Height:  5\' 6"  (167.6 cm)  BEHAVIORAL SYMPTOMS/MOOD NEUROLOGICAL BOWEL NUTRITION STATUS        Diet (heart healthy/carb modified)  AMBULATORY STATUS COMMUNICATION OF NEEDS Skin   Supervision Verbally Normal                       Personal Care Assistance Level of Assistance  Bathing, Feeding, Dressing Bathing Assistance: Limited assistance Feeding assistance: Independent Dressing Assistance: Limited assistance     Functional Limitations Info             SPECIAL CARE FACTORS FREQUENCY  PT (By licensed PT), OT (By licensed OT)     PT Frequency: home health OT Frequency: home health            Contractures      Additional Factors Info  Code Status, Allergies Code Status Info: Do not attempt resuscitation (DNR) -DNR-LIMITED -Do Not Intubate/DNI Allergies Info: Warfarin And Related, Warfarin           Current Medications (05/24/2023):   TAKE these medications     acetaminophen 325 MG tablet Commonly known as: TYLENOL Take 650 mg by mouth every 6 (six) hours as needed.    aspirin EC 81 MG tablet Take 81 mg by mouth daily.    benazepril 5 MG tablet Commonly known as: LOTENSIN TAKE ONE TABLET EVERY DAY    calcium carbonate 600 MG Tabs tablet Commonly known as: OS-CAL Take one tablet by mouth once a day. What changed:  how  much to take additional instructions    fenofibrate 160 MG tablet Take 1 tablet (160 mg total) by mouth daily.    HumaLOG Mix 75/25 KwikPen (75-25) 100 UNIT/ML KwikPen Generic drug: Insulin Lispro Prot & Lispro INJECT 14 UNITS SUBCUTANEOUSLY TWICE A DAY WITH A MEAL.    hydrocortisone 2.5 % rectal cream Commonly known as: ANUSOL-HC Place 1 Application rectally 2 (two) times daily.    multivitamin with minerals Tabs tablet Take 1 tablet by mouth daily.    NovoFine Plus 32G X 4 MM Misc Generic drug:  Insulin Pen Needle USE TWICE A DAY AS DIRECTED    BD AutoShield Duo 30G X 5 MM Misc Generic drug: Insulin Pen Needle USE TWICE A DAY.    pantoprazole 40 MG tablet Commonly known as: PROTONIX TAKE 1 TABLET BY MOUTH EVERY-OTHER-DAY What changed: See the new instructions.    rosuvastatin 40 MG tablet Commonly known as: CRESTOR TAKE (1) TABLET BY MOUTH ONCE A DAY What changed: See the new instructions.    Vitamin D3 50 MCG (2000 UT) Tabs Take 2,000 Units by mouth daily.       Relevant Imaging Results:  Relevant Lab Results:   Additional Information SS #: [id number]  Kinnley Paulson E Adea Geisel, LCSW

## 2023-05-24 NOTE — Care Management Obs Status (Signed)
MEDICARE OBSERVATION STATUS NOTIFICATION   Patient Details  Name: Laurie Harris MRN: [id number] Date of Birth: 07-26-1941   Medicare Observation Status Notification Given:  Yes  Attempted to provide MOON at bedside. Patient had already left.   Camrin Lapre E Jaidin Richison, LCSW 05/24/2023, 4:13 PM

## 2023-05-24 NOTE — TOC Initial Note (Signed)
Transition of Care [facility]) - Initial/Assessment Note    Patient Details  Name: Laurie Harris MRN: [id number] Date of Birth: 17-Nov-1940  Transition of Care [facility]) CM/SW Contact:    Liliana Cline, LCSW Phone Number: 05/24/2023, 4:12 PM  Clinical Narrative:                 Patient to discharge back to [facility]F today. CSW confirmed with Herbert Seta from [facility]. FL2 faxed to [facility]. Herbert Seta states they can do home health through their in house agency - [facility]. Daughter to transport back to ALF today.   Expected Discharge Plan: Assisted Living Barriers to Discharge: Barriers Resolved   Patient Goals and CMS Choice            Expected Discharge Plan and Services       Living arrangements for the past 2 months: Assisted Living Facility Expected Discharge Date: 05/24/23                                    Prior Living Arrangements/Services Living arrangements for the past 2 months: Assisted Living Facility Lives with:: Facility Resident Patient language and need for interpreter reviewed:: Yes        Need for Family Participation in Patient Care: Yes (Comment) Care giver support system in place?: Yes (comment) Current home services: Home PT Criminal Activity/Legal Involvement Pertinent to Current Situation/Hospitalization: No - Comment as needed  Activities of Daily Living      Permission Sought/Granted                  Emotional Assessment         Alcohol / Substance Use: Not Applicable Psych Involvement: No (comment)  Admission diagnosis:  AMS (altered mental status) [R41.82] Patient Active Problem List   Diagnosis Date Noted   AMS (altered mental status) 05/23/2023   Hallucination 05/12/2023   Slurred speech 05/03/2023   Hospital discharge follow-up 07/20/2022   Appendicitis 06/09/2022   Acute abdominal pain 06/08/2022   Acute appendicitis 06/08/2022   Stage 3a chronic kidney disease (CKD) (HCC) 06/08/2022   Acute  perforated appendicitis 06/08/2022   Bright red blood per rectum 12/19/2021   Bruising 12/19/2021   Unwitnessed fall 12/19/2021   Thoracic back pain 11/03/2021   Diabetic retinopathy (HCC) 09/29/2021   Depression, major, single episode, mild (HCC) 07/05/2020   GERD (gastroesophageal reflux disease) 11/11/2019   Memory difficulty 10/15/2019   Grief 07/10/2019   Hirsutism 10/29/2018   Constipation 07/29/2018   Osteoporosis 12/26/2017   Aorto-iliac atherosclerosis (HCC) 09/20/2017   Macrocytosis without anemia 09/20/2017   Carotid artery disease (HCC) 06/19/2016   Acute renal failure superimposed on stage 3a chronic kidney disease (HCC) 05/10/2016   Type II diabetes mellitus with renal manifestations (HCC) 03/16/2015   Benign essential HTN 03/16/2015   Hyperlipidemia 03/16/2015   Carotid artery occlusion without infarction 08/18/2013   PCP:  Glori Luis, MD Pharmacy:   [facility] - [place], [place] - [address] [place] [address] Phone: [phone] Fax: [phone]     Social Determinants of Health (SDOH) Social History: SDOH Screenings   Food Insecurity: No Food Insecurity (05/16/2023)  Housing: Low Risk  (05/16/2023)  Transportation Needs: No Transportation Needs (05/16/2023)  Utilities: Not At Risk (05/16/2023)  Depression (PHQ2-9): High Risk (05/10/2023)  Financial Resource Strain: Low Risk  (05/09/2023)  Physical Activity: Inactive (  05/09/2023)  Social Connections: Moderately Integrated (05/09/2023)  Stress: Patient Declined (05/09/2023)  Tobacco Use: Low Risk  (05/23/2023)   SDOH Interventions:     Readmission Risk Interventions     No data to display

## 2023-05-24 NOTE — Progress Notes (Signed)
Occupational Therapy Evaluation Patient Details Name: FLORENTINA BURGESON MRN: [id number] DOB: Feb 14, 1941 Today's Date: 05/24/2023   History of Present Illness Patient is a 82 y.o. female with medical history significant of ICH cerebella 2016, CKD stage IIIa, IDDM, HTN, HLD, brought in by family member for evaluation of worsening of slurred speech and unsteady gait. Current MD assessment includes: Altered mental status, Dysarthria, ataxia, visual hallucinations, and sundowning.   Clinical Impression    Pt was seen for OT evaluation this date. Prior to hospital admission, pt was living in ALF. Pt states that staff assists her with bathing at the ALF. Pt presents to acute OT demonstrating impaired ADL performance and functional mobility (See OT problem list for additional functional deficits). Pt was orientated x3. Pt currently requires MIN A + tactile cues bringing legs to EOB. Sit to stand pt needs MIN A +RW to begin lift off from EOB.   Pt needed CGA + RW for BSC t/f and while ambulating 60 ft. Pt needed MIN A for peri care. CGA standing grooming for 5 mins. Pt would benefit from skilled OT services to address noted impairments and functional limitations (see below for any additional details) in order to maximize safety and independence while minimizing falls risk and caregiver burden.      If plan is discharge home, recommend the following: Assistance with cooking/housework;Assist for transportation;A little help with bathing/dressing/bathroom;A little help with walking and/or transfers    Functional Status Assessment  Patient has had a recent decline in their functional status and demonstrates the ability to make significant improvements in function in a reasonable and predictable amount of time.  Equipment Recommendations  Tub/shower bench;Toilet riser    Recommendations for Other Services       Precautions / Restrictions Precautions Precautions: Fall Restrictions Weight Bearing  Restrictions: No      Mobility Bed Mobility Overal bed mobility: Needs Assistance Bed Mobility: Supine to Sit     Supine to sit: Min assist Sit to supine: Contact guard assist   General bed mobility comments: MIN A from bed    Transfers Overall transfer level: Needs assistance Equipment used: Rolling walker (2 wheels), 1 person hand held assist Transfers: Sit to/from Stand Sit to Stand: Min assist                  Balance Overall balance assessment: Needs assistance Sitting-balance support: Feet supported Sitting balance-Leahy Scale: Fair     Standing balance support: Bilateral upper extremity supported, During functional activity Standing balance-Leahy Scale: Fair Standing balance comment: observed loss of standing balance when whiping herself, self corrected by adjusting grip on RW                           ADL either performed or assessed with clinical judgement   ADL Overall ADL's : Needs assistance/impaired Eating/Feeding: Set up   Grooming: Wash/dry hands;Wash/dry face;Oral care;Contact guard assist;Standing                   Statistician: Contact guard assist;Rolling walker (2 wheels)   Toileting- Clothing Manipulation and Hygiene: Minimal assistance Toileting - Clothing Manipulation Details (indicate cue type and reason): extra whipping needed, and cue to stabilzation while whipping herself     Functional mobility during ADLs: Contact guard assist;Rolling walker (2 wheels)       Vision         Perception         Praxis  Pertinent Vitals/Pain       Extremity/Trunk Assessment Upper Extremity Assessment Upper Extremity Assessment: Overall WFL for tasks assessed   Lower Extremity Assessment Lower Extremity Assessment: Generalized weakness       Communication Communication Communication: No apparent difficulties   Cognition Arousal: Alert Behavior During Therapy: WFL for tasks  assessed/performed Overall Cognitive Status: History of cognitive impairments - at baseline Area of Impairment: Orientation, Problem solving                 Orientation Level: Disoriented to, Situation           Problem Solving: Slow processing, Decreased initiation       General Comments       Exercises Other Exercises Other Exercises: Edu: Safe ADL completion, home safety   Shoulder Instructions      Home Living Family/patient expects to be discharged to:: Assisted living                             Home Equipment: Rollator (4 wheels)   Additional Comments: Pt resident at [facility]      Prior Functioning/Environment Prior Level of Function : Independent/Modified Independent             Mobility Comments: Able to walk by herself with rollator to/from dining hall ADLs Comments: reports assistance for bathing        OT Problem List: Decreased cognition;Impaired balance (sitting and/or standing);Decreased strength;Decreased activity tolerance      OT Treatment/Interventions: Self-care/ADL training    OT Goals(Current goals can be found in the care plan section) Acute Rehab OT Goals Patient Stated Goal: To return home OT Goal Formulation: With patient Time For Goal Achievement: 06/07/23 Potential to Achieve Goals: Good ADL Goals Pt Will Perform Grooming: standing;with modified independence (No cues, tolerance 10 min standing grooming activity) Pt Will Perform Upper Body Dressing: Independently;sitting Pt Will Transfer to Toilet: ambulating;with min assist Pt Will Perform Toileting - Clothing Manipulation and hygiene: sit to/from stand;sitting/lateral leans;with adaptive equipment;with modified independence  OT Frequency: Min 1X/week    Co-evaluation              AM-PAC OT "6 Clicks" Daily Activity     Outcome Measure Help from another person eating meals?: None Help from another person taking care of personal grooming?: A  Little Help from another person toileting, which includes using toliet, bedpan, or urinal?: A Little Help from another person bathing (including washing, rinsing, drying)?: A Lot Help from another person to put on and taking off regular upper body clothing?: None Help from another person to put on and taking off regular lower body clothing?: A Lot 6 Click Score: 18   End of Session Equipment Utilized During Treatment: Rolling walker (2 wheels)  Activity Tolerance: Patient tolerated treatment well Patient left: in bed;with call bell/phone within reach;with bed alarm set  OT Visit Diagnosis: Unsteadiness on feet (R26.81);Other abnormalities of gait and mobility (R26.89);Other symptoms and signs involving cognitive function;Muscle weakness (generalized) (M62.81)                Time: 4098-1191 OT Time Calculation (min): 29 min Charges:  OT General Charges $OT Visit: 1 Visit OT Evaluation $OT Eval Low Complexity: 1 Low OT Treatments $Self Care/Home Management : 8-22 mins  [employer], OTS

## 2023-05-24 NOTE — ED Notes (Signed)
Report called to [facility] at [facility]

## 2023-05-25 ENCOUNTER — Encounter: Payer: Self-pay | Admitting: Internal Medicine

## 2023-05-25 ENCOUNTER — Emergency Department: Payer: Medicare Other

## 2023-05-25 DIAGNOSIS — I6521 Occlusion and stenosis of right carotid artery: Secondary | ICD-10-CM | POA: Diagnosis not present

## 2023-05-25 DIAGNOSIS — I2489 Other forms of acute ischemic heart disease: Secondary | ICD-10-CM | POA: Diagnosis not present

## 2023-05-25 DIAGNOSIS — I2510 Atherosclerotic heart disease of native coronary artery without angina pectoris: Secondary | ICD-10-CM | POA: Diagnosis present

## 2023-05-25 DIAGNOSIS — I10 Essential (primary) hypertension: Secondary | ICD-10-CM | POA: Diagnosis not present

## 2023-05-25 DIAGNOSIS — F0393 Unspecified dementia, unspecified severity, with mood disturbance: Secondary | ICD-10-CM | POA: Diagnosis present

## 2023-05-25 DIAGNOSIS — F4320 Adjustment disorder, unspecified: Secondary | ICD-10-CM | POA: Diagnosis present

## 2023-05-25 DIAGNOSIS — I5A Non-ischemic myocardial injury (non-traumatic): Secondary | ICD-10-CM | POA: Diagnosis not present

## 2023-05-25 DIAGNOSIS — I619 Nontraumatic intracerebral hemorrhage, unspecified: Secondary | ICD-10-CM | POA: Diagnosis not present

## 2023-05-25 DIAGNOSIS — R680 Hypothermia, not associated with low environmental temperature: Secondary | ICD-10-CM | POA: Diagnosis present

## 2023-05-25 DIAGNOSIS — G9341 Metabolic encephalopathy: Secondary | ICD-10-CM | POA: Diagnosis present

## 2023-05-25 DIAGNOSIS — Z7401 Bed confinement status: Secondary | ICD-10-CM | POA: Diagnosis not present

## 2023-05-25 DIAGNOSIS — N1831 Chronic kidney disease, stage 3a: Secondary | ICD-10-CM

## 2023-05-25 DIAGNOSIS — G238 Other specified degenerative diseases of basal ganglia: Secondary | ICD-10-CM | POA: Diagnosis not present

## 2023-05-25 DIAGNOSIS — T68XXXA Hypothermia, initial encounter: Secondary | ICD-10-CM

## 2023-05-25 DIAGNOSIS — F0392 Unspecified dementia, unspecified severity, with psychotic disturbance: Secondary | ICD-10-CM | POA: Diagnosis present

## 2023-05-25 DIAGNOSIS — A419 Sepsis, unspecified organism: Secondary | ICD-10-CM | POA: Diagnosis not present

## 2023-05-25 DIAGNOSIS — F01A18 Vascular dementia, mild, with other behavioral disturbance: Secondary | ICD-10-CM | POA: Diagnosis not present

## 2023-05-25 DIAGNOSIS — E1129 Type 2 diabetes mellitus with other diabetic kidney complication: Secondary | ICD-10-CM | POA: Diagnosis not present

## 2023-05-25 DIAGNOSIS — Z1152 Encounter for screening for COVID-19: Secondary | ICD-10-CM | POA: Diagnosis not present

## 2023-05-25 DIAGNOSIS — Z136 Encounter for screening for cardiovascular disorders: Secondary | ICD-10-CM | POA: Diagnosis not present

## 2023-05-25 DIAGNOSIS — E7800 Pure hypercholesterolemia, unspecified: Secondary | ICD-10-CM | POA: Diagnosis not present

## 2023-05-25 DIAGNOSIS — F0394 Unspecified dementia, unspecified severity, with anxiety: Secondary | ICD-10-CM | POA: Diagnosis present

## 2023-05-25 DIAGNOSIS — E860 Dehydration: Secondary | ICD-10-CM | POA: Diagnosis present

## 2023-05-25 DIAGNOSIS — R9082 White matter disease, unspecified: Secondary | ICD-10-CM | POA: Diagnosis not present

## 2023-05-25 DIAGNOSIS — I214 Non-ST elevation (NSTEMI) myocardial infarction: Secondary | ICD-10-CM | POA: Diagnosis not present

## 2023-05-25 DIAGNOSIS — E785 Hyperlipidemia, unspecified: Secondary | ICD-10-CM | POA: Diagnosis present

## 2023-05-25 DIAGNOSIS — S0003XA Contusion of scalp, initial encounter: Secondary | ICD-10-CM | POA: Diagnosis not present

## 2023-05-25 DIAGNOSIS — E1121 Type 2 diabetes mellitus with diabetic nephropathy: Secondary | ICD-10-CM | POA: Diagnosis not present

## 2023-05-25 DIAGNOSIS — R4182 Altered mental status, unspecified: Secondary | ICD-10-CM

## 2023-05-25 DIAGNOSIS — E872 Acidosis, unspecified: Secondary | ICD-10-CM | POA: Diagnosis present

## 2023-05-25 DIAGNOSIS — Z66 Do not resuscitate: Secondary | ICD-10-CM | POA: Diagnosis present

## 2023-05-25 DIAGNOSIS — E1122 Type 2 diabetes mellitus with diabetic chronic kidney disease: Secondary | ICD-10-CM | POA: Diagnosis present

## 2023-05-25 DIAGNOSIS — I6789 Other cerebrovascular disease: Secondary | ICD-10-CM | POA: Diagnosis not present

## 2023-05-25 DIAGNOSIS — W19XXXA Unspecified fall, initial encounter: Secondary | ICD-10-CM | POA: Diagnosis not present

## 2023-05-25 DIAGNOSIS — I6522 Occlusion and stenosis of left carotid artery: Secondary | ICD-10-CM | POA: Diagnosis not present

## 2023-05-25 DIAGNOSIS — R6510 Systemic inflammatory response syndrome (SIRS) of non-infectious origin without acute organ dysfunction: Secondary | ICD-10-CM | POA: Diagnosis present

## 2023-05-25 DIAGNOSIS — D696 Thrombocytopenia, unspecified: Secondary | ICD-10-CM | POA: Diagnosis present

## 2023-05-25 DIAGNOSIS — I672 Cerebral atherosclerosis: Secondary | ICD-10-CM | POA: Diagnosis not present

## 2023-05-25 DIAGNOSIS — Z794 Long term (current) use of insulin: Secondary | ICD-10-CM | POA: Diagnosis not present

## 2023-05-25 DIAGNOSIS — F05 Delirium due to known physiological condition: Secondary | ICD-10-CM | POA: Diagnosis not present

## 2023-05-25 DIAGNOSIS — I69354 Hemiplegia and hemiparesis following cerebral infarction affecting left non-dominant side: Secondary | ICD-10-CM | POA: Diagnosis not present

## 2023-05-25 DIAGNOSIS — M160 Bilateral primary osteoarthritis of hip: Secondary | ICD-10-CM | POA: Diagnosis not present

## 2023-05-25 DIAGNOSIS — R296 Repeated falls: Secondary | ICD-10-CM

## 2023-05-25 DIAGNOSIS — G9340 Encephalopathy, unspecified: Secondary | ICD-10-CM | POA: Diagnosis present

## 2023-05-25 DIAGNOSIS — I129 Hypertensive chronic kidney disease with stage 1 through stage 4 chronic kidney disease, or unspecified chronic kidney disease: Secondary | ICD-10-CM | POA: Diagnosis present

## 2023-05-25 DIAGNOSIS — E118 Type 2 diabetes mellitus with unspecified complications: Secondary | ICD-10-CM | POA: Diagnosis not present

## 2023-05-25 DIAGNOSIS — R1310 Dysphagia, unspecified: Secondary | ICD-10-CM | POA: Diagnosis present

## 2023-05-25 DIAGNOSIS — R471 Dysarthria and anarthria: Secondary | ICD-10-CM | POA: Diagnosis present

## 2023-05-25 DIAGNOSIS — Z515 Encounter for palliative care: Secondary | ICD-10-CM | POA: Diagnosis not present

## 2023-05-25 LAB — COMPREHENSIVE METABOLIC PANEL
ALT: 49 U/L — ABNORMAL HIGH (ref 0–44)
AST: 57 U/L — ABNORMAL HIGH (ref 15–41)
Albumin: 3.7 g/dL (ref 3.5–5.0)
Alkaline Phosphatase: 128 U/L — ABNORMAL HIGH (ref 38–126)
Anion gap: 16 — ABNORMAL HIGH (ref 5–15)
BUN: 30 mg/dL — ABNORMAL HIGH (ref 8–23)
CO2: 21 mmol/L — ABNORMAL LOW (ref 22–32)
Calcium: 10.1 mg/dL (ref 8.9–10.3)
Chloride: 106 mmol/L (ref 98–111)
Creatinine, Ser: 0.89 mg/dL (ref 0.44–1.00)
GFR, Estimated: >60 mL/min (ref >60)
Glucose, Bld: 164 mg/dL — ABNORMAL HIGH (ref 70–99)
Potassium: 3.6 mmol/L (ref 3.5–5.1)
Sodium: 143 mmol/L (ref 135–145)
Total Bilirubin: 1.1 mg/dL (ref 0.3–1.2)
Total Protein: 7 g/dL (ref 6.5–8.1)

## 2023-05-25 LAB — PROTIME-INR
INR: 1.2 (ref 0.8–1.2)
Prothrombin Time: 15.9 seconds — ABNORMAL HIGH (ref 11.4–15.2)

## 2023-05-25 LAB — BASIC METABOLIC PANEL
Anion gap: 11 (ref 5–15)
BUN: 30 mg/dL — ABNORMAL HIGH (ref 8–23)
CO2: 24 mmol/L (ref 22–32)
Calcium: 9.7 mg/dL (ref 8.9–10.3)
Chloride: 108 mmol/L (ref 98–111)
Creatinine, Ser: 0.82 mg/dL (ref 0.44–1.00)
GFR, Estimated: >60 mL/min (ref >60)
Glucose, Bld: 100 mg/dL — ABNORMAL HIGH (ref 70–99)
Potassium: 3.6 mmol/L (ref 3.5–5.1)
Sodium: 143 mmol/L (ref 135–145)

## 2023-05-25 LAB — URINALYSIS, W/ REFLEX TO CULTURE (INFECTION SUSPECTED)
Bilirubin Urine: NEGATIVE
Glucose, UA: NEGATIVE mg/dL
Hgb urine dipstick: NEGATIVE
Ketones, ur: NEGATIVE mg/dL
Leukocytes,Ua: NEGATIVE
Nitrite: NEGATIVE
Protein, ur: 30 mg/dL — AB
Specific Gravity, Urine: 1.018 (ref 1.005–1.030)
pH: 5 (ref 5.0–8.0)

## 2023-05-25 LAB — TROPONIN I (HIGH SENSITIVITY)
Troponin I (High Sensitivity): 60 ng/L — ABNORMAL HIGH (ref <18)
Troponin I (High Sensitivity): 138 ng/L (ref <18)
Troponin I (High Sensitivity): 1176 ng/L (ref <18)
Troponin I (High Sensitivity): 3148 ng/L (ref <18)
Troponin I (High Sensitivity): 3014 ng/L (ref <18)
Troponin I (High Sensitivity): 2495 ng/L (ref <18)

## 2023-05-25 LAB — CBC
HCT: 33.6 % — ABNORMAL LOW (ref 36.0–46.0)
Hemoglobin: 11.6 g/dL — ABNORMAL LOW (ref 12.0–15.0)
MCH: 33.2 pg (ref 26.0–34.0)
MCHC: 34.5 g/dL (ref 30.0–36.0)
MCV: 96.3 fL (ref 80.0–100.0)
Platelets: 101 10*3/uL — ABNORMAL LOW (ref 150–400)
RBC: 3.49 MIL/uL — ABNORMAL LOW (ref 3.87–5.11)
RDW: 14.9 % (ref 11.5–15.5)
WBC: 8.1 10*3/uL (ref 4.0–10.5)
nRBC: 0 % (ref 0.0–0.2)

## 2023-05-25 LAB — GLUCOSE, CAPILLARY
Glucose-Capillary: 83 mg/dL (ref 70–99)
Glucose-Capillary: 115 mg/dL — ABNORMAL HIGH (ref 70–99)
Glucose-Capillary: 118 mg/dL — ABNORMAL HIGH (ref 70–99)
Glucose-Capillary: 101 mg/dL — ABNORMAL HIGH (ref 70–99)

## 2023-05-25 LAB — RESP PANEL BY RT-PCR (RSV, FLU A&B, COVID)  RVPGX2
Influenza A by PCR: NEGATIVE
Influenza B by PCR: NEGATIVE
Resp Syncytial Virus by PCR: NEGATIVE
SARS Coronavirus 2 by RT PCR: NEGATIVE

## 2023-05-25 LAB — BRAIN NATRIURETIC PEPTIDE: B Natriuretic Peptide: 117.2 pg/mL — ABNORMAL HIGH (ref 0.0–100.0)

## 2023-05-25 LAB — HEPARIN LEVEL (UNFRACTIONATED)
Heparin Unfractionated: 0.69 IU/mL (ref 0.30–0.70)
Heparin Unfractionated: >1.1 IU/mL — ABNORMAL HIGH (ref 0.30–0.70)

## 2023-05-25 LAB — PROCALCITONIN: Procalcitonin: 0.13 ng/mL

## 2023-05-25 LAB — HEMOGLOBIN A1C
Hgb A1c MFr Bld: 7 % — ABNORMAL HIGH (ref 4.8–5.6)
Mean Plasma Glucose: 154.2 mg/dL

## 2023-05-25 LAB — LACTIC ACID, PLASMA
Lactic Acid, Venous: 2.5 mmol/L (ref 0.5–1.9)
Lactic Acid, Venous: 1.5 mmol/L (ref 0.5–1.9)

## 2023-05-25 LAB — APTT: aPTT: 38 seconds — ABNORMAL HIGH (ref 24–36)

## 2023-05-25 MED ORDER — INSULIN ASPART PROT & ASPART (70-30 MIX) 100 UNIT/ML ~~LOC~~ SUSP
10.0000 [IU] | Freq: Two times a day (BID) | SUBCUTANEOUS | Status: DC
  Filled 2023-05-25: qty 10

## 2023-05-25 MED ORDER — METOPROLOL TARTRATE 25 MG PO TABS
25.0000 mg | ORAL_TABLET | Freq: Two times a day (BID) | ORAL | Status: DC
  Administered 2023-05-25: 25 mg via ORAL
  Filled 2023-05-25 (×2): qty 1

## 2023-05-25 MED ORDER — VANCOMYCIN HCL IN DEXTROSE 1-5 GM/200ML-% IV SOLN: 1000.0000 mg | Freq: Once | INTRAVENOUS | Status: DC

## 2023-05-25 MED ORDER — SODIUM CHLORIDE 0.9 % IV SOLN: INTRAVENOUS | Status: DC

## 2023-05-25 MED ORDER — QUETIAPINE FUMARATE 25 MG PO TABS
25.0000 mg | ORAL_TABLET | Freq: Every day | ORAL | Status: DC
  Filled 2023-05-25: qty 1

## 2023-05-25 MED ORDER — SODIUM CHLORIDE 0.9 % IV SOLN: 2.0000 g | Freq: Once | INTRAVENOUS | Status: DC

## 2023-05-25 MED ORDER — LACTATED RINGERS IV BOLUS (SEPSIS)
250.0000 mL | Freq: Once | INTRAVENOUS | Status: AC
  Administered 2023-05-25: 250 mL via INTRAVENOUS

## 2023-05-25 MED ORDER — HEPARIN BOLUS VIA INFUSION
3300.0000 [IU] | Freq: Once | INTRAVENOUS | Status: AC
  Administered 2023-05-25: 3300 [IU] via INTRAVENOUS
  Filled 2023-05-25: qty 3300

## 2023-05-25 MED ORDER — ROSUVASTATIN CALCIUM 10 MG PO TABS
40.0000 mg | ORAL_TABLET | Freq: Every day | ORAL | Status: DC
  Administered 2023-05-25: 40 mg via ORAL
  Filled 2023-05-25 (×2): qty 4

## 2023-05-25 MED ORDER — METRONIDAZOLE 500 MG/100ML IV SOLN
500.0000 mg | Freq: Once | INTRAVENOUS | Status: AC
  Administered 2023-05-25: 500 mg via INTRAVENOUS
  Filled 2023-05-25: qty 100

## 2023-05-25 MED ORDER — ONDANSETRON HCL 4 MG/2ML IJ SOLN: 4.0000 mg | Freq: Three times a day (TID) | INTRAMUSCULAR | Status: DC | PRN

## 2023-05-25 MED ORDER — ACETAMINOPHEN 325 MG PO TABS: 650.0000 mg | ORAL_TABLET | Freq: Four times a day (QID) | ORAL | Status: DC | PRN

## 2023-05-25 MED ORDER — PANTOPRAZOLE SODIUM 40 MG PO TBEC
40.0000 mg | DELAYED_RELEASE_TABLET | ORAL | Status: DC
  Administered 2023-05-25: 40 mg via ORAL
  Filled 2023-05-25: qty 1

## 2023-05-25 MED ORDER — LACTATED RINGERS IV BOLUS (SEPSIS)
500.0000 mL | Freq: Once | INTRAVENOUS | Status: AC
  Administered 2023-05-25: 500 mL via INTRAVENOUS

## 2023-05-25 MED ORDER — SODIUM CHLORIDE 0.9 % IV SOLN
2.0000 g | Freq: Two times a day (BID) | INTRAVENOUS | Status: DC
  Administered 2023-05-25: 2 g via INTRAVENOUS
  Filled 2023-05-25 (×3): qty 12.5

## 2023-05-25 MED ORDER — LORAZEPAM 0.5 MG PO TABS: 0.5000 mg | ORAL_TABLET | Freq: Four times a day (QID) | ORAL | Status: DC | PRN

## 2023-05-25 MED ORDER — ASPIRIN 81 MG PO TBEC
81.0000 mg | DELAYED_RELEASE_TABLET | Freq: Every day | ORAL | Status: DC
  Administered 2023-05-25: 81 mg via ORAL
  Filled 2023-05-25 (×2): qty 1

## 2023-05-25 MED ORDER — HYDRALAZINE HCL 20 MG/ML IJ SOLN: 5.0000 mg | INTRAMUSCULAR | Status: DC | PRN

## 2023-05-25 MED ORDER — HEPARIN (PORCINE) 25000 UT/250ML-% IV SOLN
500.0000 [IU]/h | INTRAVENOUS | Status: DC
  Administered 2023-05-26: 600 [IU]/h via INTRAVENOUS

## 2023-05-25 MED ORDER — INSULIN ASPART 100 UNIT/ML IJ SOLN: 0.0000 [IU] | Freq: Three times a day (TID) | INTRAMUSCULAR | Status: DC

## 2023-05-25 MED ORDER — FENOFIBRATE 160 MG PO TABS
160.0000 mg | ORAL_TABLET | Freq: Every day | ORAL | Status: DC
  Administered 2023-05-25: 160 mg via ORAL
  Filled 2023-05-25 (×2): qty 1

## 2023-05-25 MED ORDER — VANCOMYCIN HCL 750 MG/150ML IV SOLN: 750.0000 mg | INTRAVENOUS | Status: DC

## 2023-05-25 MED ORDER — INSULIN ASPART 100 UNIT/ML IJ SOLN: 0.0000 [IU] | Freq: Every day | INTRAMUSCULAR | Status: DC

## 2023-05-25 MED ORDER — HEPARIN (PORCINE) 25000 UT/250ML-% IV SOLN
750.0000 [IU]/h | INTRAVENOUS | Status: DC
  Administered 2023-05-25: 750 [IU]/h via INTRAVENOUS
  Filled 2023-05-25: qty 250

## 2023-05-25 MED ORDER — VANCOMYCIN HCL 1250 MG/250ML IV SOLN
1250.0000 mg | Freq: Once | INTRAVENOUS | Status: AC
  Administered 2023-05-25: 1250 mg via INTRAVENOUS
  Filled 2023-05-25 (×2): qty 250

## 2023-05-25 MED ORDER — LACTATED RINGERS IV BOLUS (SEPSIS)
1000.0000 mL | Freq: Once | INTRAVENOUS | Status: AC
  Administered 2023-05-25: 1000 mL via INTRAVENOUS

## 2023-05-25 MED ORDER — ACETAMINOPHEN 650 MG RE SUPP: 650.0000 mg | Freq: Four times a day (QID) | RECTAL | Status: DC | PRN

## 2023-05-25 NOTE — Progress Notes (Signed)
OT Cancellation Note  Patient Details Name: Laurie Harris MRN: [id number] DOB: Mar 20, 1941   Cancelled Treatment:     OT order received, pt lethargic this afternoon, will continue attempts for evaluation next date.    Alix Stowers T Kadasia Kassing, OTR/L, CLT   Tommie Bohlken 05/25/2023, 2:29 PM

## 2023-05-25 NOTE — Assessment & Plan Note (Signed)
Patient with recurrent falls.  No acute fractures. -PT/OT evaluation-patient might need to go to SNF instead of ALF

## 2023-05-25 NOTE — Plan of Care (Signed)

## 2023-05-25 NOTE — Assessment & Plan Note (Signed)
Stable renal function. -Monitor renal function -Avoid nephrotoxins

## 2023-05-25 NOTE — Progress Notes (Signed)
ANTICOAGULATION CONSULT NOTE  Pharmacy Consult for heparin infusion Indication: ACS / STEMI  Allergies  Allergen Reactions   Warfarin And Related     Possible stroke   Warfarin     Patient Measurements:   Heparin Dosing Weight: 55.8 kg  Vital Signs: Temp: 98.2 F (36.8 C) (09/21 2048) Temp Source: Oral (09/21 2048) BP: 146/67 (09/21 2048) Pulse Rate: 105 (09/21 2048)  Labs: Recent Labs    05/23/23 0901 05/23/23 0901 05/24/23 2327 05/25/23 0258 05/25/23 0442 05/25/23 0801 05/25/23 0956 05/25/23 1143 05/25/23 1429 05/25/23 2243  HGB 12.3  --  12.5 11.6*  --   --   --   --   --   --   HCT 34.7*  --  37.0 33.6*  --   --   --   --   --   --   PLT 107*  --  108* 101*  --   --   --   --   --   --   APTT 34  --   --  38*  --   --   --   --   --   --   LABPROT 14.7  --   --  15.9*  --   --   --   --   --   --   INR 1.1  --   --  1.2  --   --   --   --   --   --   HEPARINUNFRC  --   --   --   --   --   --   --   --  0.69 >1.10*  CREATININE 1.01*  --  0.89 0.82  --   --   --   --   --   --   TROPONINIHS  --    < > 60* 138*   < > 3,148* 3,014* 2,495*  --   --    < > = values in this interval not displayed.    Estimated Creatinine Clearance: 45 mL/min (by C-G formula based on SCr of 0.82 mg/dL).   Medical History: Past Medical History:  Diagnosis Date   Carotid arterial disease (HCC)    a. 09/2016 Carotid U/S: R carotid bifurcation dzs of 50-69%, L carotid bifurcation dzs of <50%. Patent vertebral arteries w/ anegrade flow.   Cerebellar hemorrhage (HCC) 03/16/2015   Left Cerebellar Hemorrhage- See Care Everywhere Seen by [facility]     Chicken pox    CKD (chronic kidney disease), stage III (HCC)    functioning at 46%   Diabetes mellitus without complication (HCC)    Hemorrhagic stroke (HCC) 12/31/2014   a. in the setting of warfarin therapy.   History of appendicitis 08/07/2016   Hyperlipidemia    Hypertension    Syncope    a. 09/2016 Echo: EF 55-60%, no  rwma, Gr1 DD.    Assessment: Pt is a 82 yo female returning to ED following D/C on 9/20 d/t AMS found with elevated Troponin I level, trending up.  9/21 1429 HL 0.69 Therapeutic 9/21 2243 HL >1.1 Supratherapeutic  Goal of Therapy:  Heparin level 0.3-0.7 units/ml Monitor platelets by anticoagulation protocol: Yes   Plan:  Contacted RN.  Hold infusion for 1 hour Restart heparin infusion at 600 units/hr Will recheck HL in 8 hr after restart CBC daily while on heparin  Otelia Sergeant, PharmD, [facility] 05/25/2023 11:40 PM

## 2023-05-25 NOTE — Progress Notes (Signed)
Patient troponin level 60 - 138 - 1176. EKG done, Sinus tachycardia. No c/o pain. Heparin started. Vitals now, [place]. Care ongoing.

## 2023-05-25 NOTE — Assessment & Plan Note (Signed)
Blood pressure within goal. -Started on low-dose metoprolol -Keep holding benazepril-can add as needed

## 2023-05-25 NOTE — Progress Notes (Signed)
ANTICOAGULATION CONSULT NOTE  Pharmacy Consult for heparin infusion Indication: ACS / STEMI  Allergies  Allergen Reactions   Warfarin And Related     Possible stroke   Warfarin     Patient Measurements:   Heparin Dosing Weight: 55.8 kg  Vital Signs: Temp: 97.3 F (36.3 C) (09/21 1243) Temp Source: Oral (09/21 0622) BP: 134/74 (09/21 1243) Pulse Rate: 95 (09/21 1243)  Labs: Recent Labs    05/23/23 0901 05/23/23 0901 05/24/23 2327 05/25/23 0258 05/25/23 0442 05/25/23 0801 05/25/23 0956 05/25/23 1143 05/25/23 1429  HGB 12.3  --  12.5 11.6*  --   --   --   --   --   HCT 34.7*  --  37.0 33.6*  --   --   --   --   --   PLT 107*  --  108* 101*  --   --   --   --   --   APTT 34  --   --  38*  --   --   --   --   --   LABPROT 14.7  --   --  15.9*  --   --   --   --   --   INR 1.1  --   --  1.2  --   --   --   --   --   HEPARINUNFRC  --   --   --   --   --   --   --   --  0.69  CREATININE 1.01*  --  0.89 0.82  --   --   --   --   --   TROPONINIHS  --    < > 60* 138*   < > 3,148* 3,014* 2,495*  --    < > = values in this interval not displayed.    Estimated Creatinine Clearance: 45 mL/min (by C-G formula based on SCr of 0.82 mg/dL).   Medical History: Past Medical History:  Diagnosis Date   Carotid arterial disease (HCC)    a. 09/2016 Carotid U/S: R carotid bifurcation dzs of 50-69%, L carotid bifurcation dzs of <50%. Patent vertebral arteries w/ anegrade flow.   Cerebellar hemorrhage (HCC) 03/16/2015   Left Cerebellar Hemorrhage- See Care Everywhere Seen by [facility]     Chicken pox    CKD (chronic kidney disease), stage III (HCC)    functioning at 46%   Diabetes mellitus without complication (HCC)    Hemorrhagic stroke (HCC) 12/31/2014   a. in the setting of warfarin therapy.   History of appendicitis 08/07/2016   Hyperlipidemia    Hypertension    Syncope    a. 09/2016 Echo: EF 55-60%, no rwma, Gr1 DD.    Assessment: Pt is a 82 yo female returning  to ED following D/C on 9/20 d/t AMS found with elevated Troponin I level, trending up.  9/21 1429 HL 0.69 Therapeutic   Goal of Therapy:  Heparin level 0.3-0.7 units/ml Monitor platelets by anticoagulation protocol: Yes   Plan:  Heparin therapeutic Continue heparin infusion at 750 units/hr Will check HL in 8 hr to confirm CBC daily while on heparin  Sharen Hones, PharmD, BCPS Clinical Pharmacist   05/25/2023 3:11 PM

## 2023-05-25 NOTE — Assessment & Plan Note (Signed)
Continue statin. 

## 2023-05-25 NOTE — Progress Notes (Signed)
Progress Note   Patient: Laurie Harris WGN:[id number] DOB: 26-May-1941 DOA: 05/24/2023     0 DOS: the patient was seen and examined on 05/25/2023   Brief hospital course: Taken from H&P.   Laurie Harris is a 82 y.o. female with medical history significant of ICH cerebella 2016, CKD stage IIIa, IDDM, HTN, HLD, thrombocytopenia, carotid artery stenosis, who presents with altered mental status and fall.   Patient was hospitalized from 9/19 - 9/20 due to altered mental status.  Her mental status has been declining recently.  Patient had slurred speech and unsteady gait.  She had negative MRI of brain, and EEG was with diffuse slowing and no seizure.  TSH is slightly elevated 6.15, but free T4 was normal.  Vitamin B12 level was normal.   Psychiatry evaluation, (+) grief reaction. Pt  was discharged to ALF.   Per report, pt was noted to be more confused and has had multiple falls.  Data reviewed and ED Course: pt was found to have WBC 6.9, GFR> 60, trop  60,  lactic acid 2.5, negative PCR for COVID, flu and RSV, hypothermia with temperature 94.6, blood pressure 152/83, heart rate of 110, RR 23, oxygen sat 99% on room air.  Chest x-ray negative.  CT head negative for acute intracranial abnormalities.  X-ray of pelvis negative.   EKG with sinus rhythm, QTc 499, LAD, anteroseptal infarction pattern, poor R wave progression.  Overnight due to rising troponin, she was started on heparin infusion.  Patient also met sepsis criteria due to hypothermia and tachycardia, no obvious source of infection.  She was given Lawyer and started on broad-spectrum antibiotics.  9/21: Improving hypothermia with temperature at 96 now, saturating well on room air.  Procalcitonin 0.13, lactic acidosis resolved.  Due to worsening creatinine cardiology was also consulted, likely demand ischemia with hypothermia. Patient has an outpatient appointment with neurology on 06/17/2023 for her rapidly declining mental status  over the past few weeks.  Patient with a steep decline since 07-18-2024after the death of her son.  Echocardiogram was also ordered, cardiology is recommending conservative management for now.  Palliative care was also consulted.   Assessment and Plan: * Acute metabolic encephalopathy Patient with waxing and waning mental status over the past few weeks, rapid cognitive decline since the death of her son.  All imaging negative including MRI brain.  EEG was done during most recent admission and it was negative for any seizure. Has outpatient neurology evaluation scheduled for 06/17/2023. Currently appears to be at baseline. -Continue to monitor  Fall Patient with recurrent falls.  No acute fractures. -PT/OT evaluation-patient might need to go to SNF instead of ALF  SIRS (systemic inflammatory response syndrome) (HCC)  Patient meets criteria for SIRS with heart rate of 110, RR 23, hypothermic with temperature 94.6, no leukocytosis.  Lactic acid 2.5.  Urinalysis negative except for rare bacteria.  Chest x-ray negative.  No source of infection identified.  Per daughter patient has a baseline hypothermia and her temperature remained around 95 most of the time.  Preliminary blood cultures negative.  Procalcitonin barely positive at 0.13. -Stop antibiotics and monitor  Myocardial injury Troponin elevated, peaked at 3148 and now started trending down.  No chest pain or acute EKG changes.  Cardiology was consulted and she was started on heparin infusion.  Likely demand ischemia. -Cardiology is recommending continue heparin for 48 hours -Medical management as she is not a candidate for any invasive evaluation or treatment. -Restarting home  metoprolol at a lower dose -Echocardiogram pending -Continue aspirin and statin  Type II diabetes mellitus with renal manifestations (HCC) CBG within goal with A1c of 7 Patient was taking 75/25 insulin 14 units twice daily. -Continue with home regimen along  with sliding scale  Benign essential HTN Blood pressure within goal. -Started on low-dose metoprolol -Keep holding benazepril-can add as needed  Thrombocytopenia (HCC) Patient with chronic thrombocytopenia, platelets at 108. -Continue to monitor  Hyperlipidemia -Continue statin  Stage 3a chronic kidney disease (CKD) (HCC) Stable renal function. -Monitor renal function -Avoid nephrotoxins   Subjective: Patient was alert and oriented x 2 when seen today.  Continue to have some dysarthria which is close to baseline per daughter.  Rapidly declining cognitive function over the past few weeks.  Physical Exam: Vitals:   05/25/23 0227 05/25/23 0622 05/25/23 0836 05/25/23 1243  BP: (!) 164/82 (!) 144/76 (!) 133/101 134/74  Pulse: (!) 104 (!) 101 95 95  Resp: 19 12 20 20   Temp: (!) 96.2 F (35.7 C) (!) 96 F (35.6 C) (!) 97.3 F (36.3 C) (!) 97.3 F (36.3 C)  TempSrc: Axillary Oral    SpO2: 98% 100% 99% 97%   General.  Frail elderly lady, in no acute distress. Pulmonary.  Lungs clear bilaterally, normal respiratory effort. CV.  Regular rate and rhythm, no JVD, rub or murmur. Abdomen.  Soft, nontender, nondistended, BS positive. CNS.  Alert and oriented x 2.  No focal neurologic deficit. Extremities.  No edema,  pulses intact and symmetrical. Multiple bruising involving all extremities Psychiatry.  Judgment and insight appears impaired  Data Reviewed: Prior data reviewed  Family Communication: Discussed with daughter at bedside  Disposition: Status is: Inpatient Remains inpatient appropriate because: Severity of illness  Planned Discharge Destination: Skilled nursing facility  Time spent:  minutes  This record has been created using Conservation officer, historic buildings. Errors have been sought and corrected,but may not always be located. Such creation errors do not reflect on the standard of care.   Author: Arnetha Courser, MD 05/25/2023 2:23 PM  For on call review  [email or web address].

## 2023-05-25 NOTE — Assessment & Plan Note (Signed)
CBG within goal with A1c of 7 Patient was taking 75/25 insulin 14 units twice daily. -Continue with home regimen along with sliding scale

## 2023-05-25 NOTE — Progress Notes (Signed)
PT Cancellation Note  Patient Details Name: Laurie Harris MRN: [id number] DOB: 07/19/1941   Cancelled Treatment:    Reason Eval/Treat Not Completed: Other (comment). Chart reviewed and evaluation attempted. Pt lethargic with mouth open and eyes intermittently closed. Pinpoint pupils noted and concerns with IV noted. RN notified.   Pt unable to participate at this time. Confused to self/location and only states "I don't care". Per family, has been very lethargic this date and does better in the mornings. Will re-attempt next date.   Sheza Strickland 05/25/2023, 2:17 PM Elizabeth Palau, PT, DPT, GCS [phone]

## 2023-05-25 NOTE — Assessment & Plan Note (Signed)
Patient meets criteria for SIRS with heart rate of 110, RR 23, hypothermic with temperature 94.6, no leukocytosis.  Lactic acid 2.5.  Urinalysis negative except for rare bacteria.  Chest x-ray negative.  No source of infection identified.  Per daughter patient has a baseline hypothermia and her temperature remained around 95 most of the time.  Preliminary blood cultures negative.  Procalcitonin barely positive at 0.13. -Stop antibiotics and monitor

## 2023-05-25 NOTE — Consult Note (Addendum)
Cardiology Consult    Patient ID: BREALYN Harris MRN: [id number], DOB/AGE: [birth year]   Admit date: 05/24/2023 Date of Consult: 05/25/2023  Primary Physician: Glori Luis, MD Primary Cardiologist: Lorine Bears, MD Requesting Provider: Kathie Rhodes. Nelson Chimes, MD  Patient Profile    Laurie Harris is a 82 y.o. 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 Nelson Harris.  Past Medical History   Past Medical History:  Diagnosis Date   Carotid arterial disease (HCC)    a. 09/2016 Carotid U/S: R carotid bifurcation dzs of 50-69%, L carotid bifurcation dzs of <50%. Patent vertebral arteries w/ anegrade flow.   Cerebellar hemorrhage (HCC) 03/16/2015   Left Cerebellar Hemorrhage- See Care Everywhere Seen by [facility]     Chicken pox    CKD (chronic kidney disease), stage III (HCC)    functioning at 46%   Diabetes mellitus without complication (HCC)    Hemorrhagic stroke (HCC) 12/31/2014   a. in the setting of warfarin therapy.   History of appendicitis 08/07/2016   Hyperlipidemia    Hypertension    Syncope    a. 09/2016 Echo: EF 55-60%, no rwma, Gr1 DD.    Past Surgical History:  Procedure Laterality Date   ABCESS DRAINAGE  06/14/2016   appendix   APPENDECTOMY  06/13/2016   CATARACT EXTRACTION W/PHACO Left 09/04/2017   Procedure: CATARACT EXTRACTION PHACO AND INTRAOCULAR LENS PLACEMENT (IOC) COMPLICATED LEFT DIABETIC;  Surgeon: Lockie Mola, MD;  Location: [facility];  Service: Ophthalmology;  Laterality: Left;  Diabetic - insulin   CATARACT EXTRACTION W/PHACO Right 10/02/2017   Procedure: CATARACT EXTRACTION PHACO AND  INTRAOCULAR LENS PLACEMENT (IOC) COMPLICATED  RIGHT DIABETIC;  Surgeon: Lockie Mola, MD;  Location: [facility];  Service: Ophthalmology;  Laterality: Right;  Diabetic - insulin   CHOLECYSTECTOMY N/A 06/17/2018   Procedure: LAPAROSCOPIC CHOLECYSTECTOMY;  Surgeon: Sung Amabile, DO;  Location: [facility];  Service: General;  Laterality: N/A;   COLONOSCOPY     LAPAROSCOPIC APPENDECTOMY N/A 06/08/2022   Procedure: APPENDECTOMY LAPAROSCOPIC;  Surgeon: Henrene Dodge, MD;  Location: [facility];  Service: General;  Laterality: N/A;   NECK SURGERY       Allergies  Allergies  Allergen Reactions   Warfarin And Related     Possible stroke   Warfarin     History of Present Illness     82 y.o. 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  Echo in 09/2019 showed EF of 60-65%, mild LVH, GrI DD, no rwma, nl RV fxn, and mild-mod AoV sclerosis w/o stenosis.  She was eval for c/p in 09/2022, and r/o for MI w/ nl trops.  She was d/c'd home w/o further w/u.  [name] was admitted to [facility] from assisted living facility on 05/23/2023 with altered mental status, dysarthria, ataxia, sundowning, and visual hallucinations.  Stroke was ruled out by CT/MRI.  EEG w/ diffuse slowing; no seizure.  She was felt to have Lewy body dementia and was d/c'd  home 9/20 w/ plan for outpt neurology f/u.  Following return to assisted living facility, she was sitting in her room and tried to get up and walk to get something in her room but lost her balance (was using a walker), and fell, striking the right side of her head on the floor.  She was attended to by staff and EMS was called.  She was transported to the ED where she was found to be  hypothermic @ 94.6, tachycardic (ECG - Sinus tachycardia, 109, LAD, LAE, baseline artifact - no acute ST/T changes), and hypertensive (152/83).  O2 sats 99% on RA.  CXR w/o acute findings.  CT head neg for acute abnormalities.  HsTrop elevated @ 60 w/ subsequent rise to 138  1176  3148  3014.  Lactic acid 2.5  1.5.  Procalcitonin 0.13.  She received 1750 ml of LR, flagyl, cefepime, and vanc, due to concern for SIRS.  No events on tele overnight.  Family member at bedside.  Pt denies any h/o chest pain or dyspnea.  Inpatient Medications     aspirin EC  81 mg Oral Daily   fenofibrate  160 mg Oral Daily   insulin aspart  0-5 Units Subcutaneous QHS   insulin aspart  0-9 Units Subcutaneous TID WC   insulin aspart protamine- aspart  10 Units Subcutaneous BID WC   pantoprazole  40 mg Oral QODAY   rosuvastatin  40 mg Oral Daily    Family History    Family History  Problem Relation Age of Onset   Diabetes Mother    Dementia Mother    Heart disease Mother    Cancer Mother        lung   Cancer Father        colon   Stroke Father    Stroke Daughter    Hyperlipidemia Daughter    Breast cancer Neg Hx    She indicated that her mother is deceased. She indicated that her father is deceased. She indicated that her daughter is alive. She indicated that the status of her neg hx is unknown.   Social History    Social History   Socioeconomic History   Marital status: Widowed    Spouse name: Not on file   Number of children: Not on file   Years of education: Not on file   Highest education level: 12th grade  Occupational History   Not on file  Tobacco Use   Smoking status: Never   Smokeless tobacco: Never  Vaping Use   Vaping status: Never Used  Substance and Sexual Activity   Alcohol use: Never   Drug use: Never   Sexual activity: Not Currently    Birth control/protection: None  Other Topics Concern   Not on file  Social History Narrative   Not on file   Social Determinants of  Health   Financial Resource Strain: Low Risk  (05/09/2023)   Overall Financial Resource Strain (CARDIA)    Difficulty of Paying Living Expenses: Not very hard  Food Insecurity: No Food Insecurity (05/25/2023)   Hunger Vital Sign    Worried About Running Out of Food in the Last Year: Never true    Ran Out of Food in the Last Year: Never true  Transportation Needs: No Transportation Needs (05/25/2023)   PRAPARE - Administrator, Civil Service (Medical): No    Lack of Transportation (Non-Medical): No  Physical Activity: Inactive (05/09/2023)   Exercise Vital Sign    Days  of Exercise per Week: 0 days    Minutes of Exercise per Session: 20 min  Stress: Patient Declined (05/09/2023)   [employer] of Occupational Health - Occupational Stress Questionnaire    Feeling of Stress : Patient declined  Social Connections: Moderately Integrated (05/09/2023)   Social Connection and Isolation Panel [NHANES]    Frequency of Communication with Friends and Family: More than three times a week    Frequency of Social Gatherings with Friends and Family: Twice a week    Attends Religious Services: 1 to 4 times per year    Active Member of [employer] or Organizations: Yes    Attends Banker Meetings: 1 to 4 times per year    Marital Status: Widowed  Intimate Partner Violence: Not At Risk (05/25/2023)   Humiliation, Afraid, Rape, and Kick questionnaire    Fear of Current or Ex-Partner: No    Emotionally Abused: No    Physically Abused: No    Sexually Abused: No     Review of Systems    General:  +++ malaise/wkns.  No chills, fever, night sweats or weight changes.  Cardiovascular:  No chest pain, dyspnea on exertion, edema, orthopnea, palpitations, paroxysmal nocturnal dyspnea. Dermatological: No rash, lesions/masses Respiratory: No cough, dyspnea Urologic: No hematuria, dysuria Abdominal:   No nausea, vomiting, diarrhea, bright red blood per rectum, melena, or hematemesis Neurologic:   +++ AMS.  Periods of intermittent lucidity.  Generalized wkns/unsteadiness.  No visual changes. All other systems reviewed and are otherwise negative except as noted above.  Physical Exam    Blood pressure (!) 133/101, pulse 95, temperature (!) 97.3 F (36.3 C), resp. rate 20, SpO2 99%.  General: Pleasant, NAD Psych: Flat affect. Neuro: Alert and oriented X 3. Moves all extremities spontaneously. HEENT: Large ecchymotic area w/ swelling over R temporal area. Neck: Supple without bruits or JVD. Lungs:  Resp regular and unlabored, CTA. Heart: RRR no s3, s4,2/6 syst murmur throughout, loudest @ upper sternal borders. Abdomen: Soft, non-tender, non-distended, BS + x 4.  Extremities: No clubbing, cyanosis or edema. DP/PT2+, Radials 2+ and equal bilaterally.  Ecchymosis noted to bilat ant lower legs w/ small abrasion on R lower leg.  Labs    Cardiac Enzymes Recent Labs  Lab 05/25/23 0258 05/25/23 0442 05/25/23 0801 05/25/23 0956 05/25/23 1143  TROPONINIHS 138* 1,176* 3,148* 3,014* 2,495*     BNP    Component Value Date/Time   BNP 117.2 (H) 05/24/2023 2327   Lab Results  Component Value Date   WBC 8.1 05/25/2023   HGB 11.6 (L) 05/25/2023   HCT 33.6 (L) 05/25/2023   MCV 96.3 05/25/2023   PLT 101 (L) 05/25/2023    Recent Labs  Lab 05/24/23 2327 05/25/23 0258  NA 143 143  K 3.6 3.6  CL 106 108  CO2 21* 24  BUN 30* 30*  CREATININE 0.89 0.82  CALCIUM 10.1 9.7  PROT 7.0  --   BILITOT 1.1  --   ALKPHOS 128*  --   ALT 49*  --   AST 57*  --   GLUCOSE 164* 100*   Lab Results  Component Value Date   CHOL 119 05/03/2023   HDL 41.10 05/03/2023   LDLCALC 41 05/03/2023   TRIG 183.0 (H) 05/03/2023    Radiology Studies    CT Head Wo Contrast  Result Date: 05/25/2023 CLINICAL DATA:  Mental status change, unknown cause. Seen here 05/23/2023 for left-sided stroke symptoms but had a negative brain MRI. EXAM: CT HEAD  WITHOUT CONTRAST TECHNIQUE: Contiguous axial images were  obtained from the base of the skull through the vertex without intravenous contrast. RADIATION DOSE REDUCTION: This exam was performed according to the departmental dose-optimization program which includes automated exposure control, adjustment of the mA and/or kV according to patient size and/or use of iterative reconstruction technique. COMPARISON:  Head CT and MRI brain both 05/23/2023, most recent was the MRI 1:00 p.m. FINDINGS: Brain: There is mild to moderate global atrophy with atrophic ventriculomegaly and mild small vessel disease of the cerebral white matter with chronic mineralization in the basal ganglia. No new asymmetry is seen concerning for a cortical based acute infarct, hemorrhage, mass or mass effect. There is no midline shift. The basal cisterns are clear. Vascular: Both carotid siphons and distal vertebral arteries are heavily calcified. No hyperdense central vessel is seen. Skull: Negative for fractures or focal lesions, but there is a new finding of a small right lateral frontal scalp contusion, not seen previously. Sinuses/Orbits: Clear sinuses and mastoids. Reverse S shaped nasal septum with right-sided spurring. Old lens extractions with otherwise negative orbits. Other: None. IMPRESSION: 1. No acute intracranial CT findings or interval changes. 2. Atrophy with small-vessel disease. 3. Small right lateral frontal scalp contusion, was not seen on 05/23/2023. There is no depressed skull fracture. Electronically Signed   By: Almira Bar M.D.   On: 05/25/2023 00:57   DG Chest Port 1 View  Result Date: 05/25/2023 CLINICAL DATA:  Altered mental status with multiple fall EXAM: PORTABLE CHEST 1 VIEW COMPARISON:  09/25/2022 FINDINGS: The heart size and mediastinal contours are within normal limits. Both lungs are clear. The visualized skeletal structures are unremarkable. IMPRESSION: No active disease. Electronically Signed   By: Jasmine Pang M.D.   On: 05/25/2023 00:15   DG Pelvis  Portable  Result Date: 05/25/2023 CLINICAL DATA:  Altered mental status multiple fall EXAM: PORTABLE PELVIS 1-2 VIEWS COMPARISON:  CT 06/18/2022 FINDINGS: SI joints are non widened. Pubic symphysis and rami appear intact. Bilateral hip degenerative change. No fracture or malalignment IMPRESSION: No acute osseous abnormality. Electronically Signed   By: Jasmine Pang M.D.   On: 05/25/2023 00:14   EEG adult  Result Date: 05/24/2023 Charlsie Quest, MD     05/24/2023  3:19 PM Patient Name: Laurie Harris MRN: [id number] Epilepsy Attending: Charlsie Quest Referring Physician/Provider: Emeline General, MD Date: 05/24/2023 Duration: 30.23 mins Patient history: 82 year old female with altered mental status.  EEG to alert for seizure. Level of alertness: Awake, asleep AEDs during EEG study: None Technical aspects: This EEG study was done with scalp electrodes positioned according to the 10-20 International system of electrode placement. Electrical activity was reviewed with band pass filter of 1-70Hz , sensitivity of 7 uV/mm, display speed of 1mm/sec with a 60Hz  notched filter applied as appropriate. EEG data were recorded continuously and digitally stored.  Video monitoring was available and reviewed as appropriate. Description: The posterior dominant rhythm consists of 8-9 Hz activity of moderate voltage (25-35 uV) seen predominantly in posterior head regions, symmetric and reactive to eye opening and eye closing. Sleep was characterized by vertex waves, sleep spindles (12 to 14 Hz), maximal frontocentral region. EEG showed intermittent generalized 3 to 6 Hz theta-delta slowing. Hyperventilation and photic stimulation were not performed.   ABNORMALITY - Intermittent slow, generalized IMPRESSION: This study is suggestive of mild diffuse encephalopathy. No seizures or epileptiform discharges were seen throughout the recording. Charlsie Quest   MR BRAIN WO CONTRAST  Result Date:  05/23/2023 CLINICAL DATA:  Neuro  deficit, acute, stroke suspected EXAM: MRI HEAD WITHOUT CONTRAST TECHNIQUE: Multiplanar, multiecho pulse sequences of the brain and surrounding structures were obtained without intravenous contrast. COMPARISON:  Same-day CT head, brain MR 05/03/2023 FINDINGS: Brain: Negative for an acute infarct. No hemorrhage. No hydrocephalus. No extra-axial fluid collection. Mass effect. No mass lesion. Chronic pontine and left cerebellar hemorrhages. There is sequela of mild overall chronic microvascular ischemic change. Vascular: Normal flow voids. Skull and upper cervical spine: Degenerative changes in the upper cervical spine. Sinuses/Orbits: No middle ear or mastoid effusion. Paranasal sinuses are clear. Bilateral lens replacement. Orbits are otherwise unremarkable. Other: None. IMPRESSION: No acute intracranial process. Electronically Signed   By: Lorenza Cambridge M.D.   On: 05/23/2023 15:22   CT HEAD WO CONTRAST ( )  Result Date: 05/23/2023 CLINICAL DATA:  Neuro deficit, acute, stroke suspected EXAM: CT HEAD WITHOUT CONTRAST TECHNIQUE: Contiguous axial images were obtained from the base of the skull through the vertex without intravenous contrast. RADIATION DOSE REDUCTION: This exam was performed according to the departmental dose-optimization program which includes automated exposure control, adjustment of the mA and/or kV according to patient size and/or use of iterative reconstruction technique. COMPARISON:  CT Head 05/10/23 FINDINGS: Brain: No hemorrhage. No hydrocephalus. No extra-axial fluid collection. No CT evidence of an acute infarct. Sequela of mild chronic microvascular ischemic change. Generalized volume loss. Vascular: No hyperdense vessel or unexpected calcification. Skull: Normal. Negative for fracture or focal lesion. Sinuses/Orbits: No middle ear or mastoid effusion. Paranasal sinuses are clear. There are osseous changes suggestive of chronic left maxillary sinusitis. Bilateral lens replacement. Orbits  are otherwise unremarkable. Other: None. IMPRESSION: No hemorrhage or CT evidence of an acute cortical infarct. Electronically Signed   By: Lorenza Cambridge M.D.   On: 05/23/2023 12:14   CT HEAD WO CONTRAST ( )  Result Date: 05/10/2023 CLINICAL DATA:  fall EXAM: CT HEAD WITHOUT CONTRAST TECHNIQUE: Contiguous axial images were obtained from the base of the skull through the vertex without intravenous contrast. RADIATION DOSE REDUCTION: This exam was performed according to the departmental dose-optimization program which includes automated exposure control, adjustment of the mA and/or kV according to patient size and/or use of iterative reconstruction technique. COMPARISON:  Brain MR 05/03/2023 FINDINGS: Brain: No evidence of acute infarction, hemorrhage, hydrocephalus, extra-axial collection or mass lesion/mass effect. Mineralization of the basal ganglia bilaterally. Vascular: No hyperdense vessel or unexpected calcification. Skull: Normal. Negative for fracture or focal lesion. Sinuses/Orbits: No middle ear or mastoid effusion. Paranasal sinuses are clear. Bilateral lens replacement. Orbits are otherwise unremarkable. Other: None. IMPRESSION: No acute intracranial abnormality. Electronically Signed   By: Lorenza Cambridge M.D.   On: 05/10/2023 17:22   MR Brain Wo Contrast  Result Date: 05/03/2023 CLINICAL DATA:  Neuro deficit, acute, stroke suspected EXAM: MRI HEAD WITHOUT CONTRAST TECHNIQUE: Multiplanar, multiecho pulse sequences of the brain and surrounding structures were obtained without intravenous contrast. COMPARISON:  MRI head 09/17/2019. FINDINGS: Brain: No acute infarction, acute hemorrhage, hydrocephalus, extra-axial collection or mass lesion. Small foci susceptibility artifact in the left cerebellum and brainstem, compatible with prior microhemorrhages. Cerebral atrophy. Vascular: Major arterial flow voids are maintained at the skull base. Skull and upper cervical spine: Normal marrow signal.  Sinuses/Orbits: Clear sinuses.  No acute orbital findings. Other: No mastoid effusions. IMPRESSION: No evidence of acute intracranial abnormality. Electronically Signed   By: Feliberto Harts M.D.   On: 05/03/2023 12:29    ECG & Cardiac Imaging    Sinus tachycardia, 109,  LAD, LAE, baseline artifact - personally reviewed.  Assessment & Plan    1.  Demand Ischemia vs NSTEMI:  Pt admitted twice in the past 2 days for AMS and now falls, hypothermia, SIRS.  Trops eval and elevated @ 60  138  1176  3148  3014.  ECG w/o acute ST/T changes.  Prior neg MV in 2018.  Last echo in 2021 w/ nl LV/RV fxn.  Denies any h/o c/p or dyspnea.  Suspect demand ischemia.  Further recs pending echo, though not an ideal candidate for invasive eval due to co-morbidities and h/o intracranial bleed on anticoagulation previously (poor candidate for DAPT).  Cont heparin x 48 hrs.  Cont asa, statin. Add ? blocker.    2.  SIRS:  Hemodynamically stable.  Per IM.  3.  HTN:  Stable. Adding low-dose ? blocker.  Follow.  4.  HL:  Cont statin.  5.  AMS:  currently lucid though sundowned last night.  Plan for ourpt neuro w/u.  Risk Assessment/Risk Scores:     TIMI Risk Score for Unstable Angina or Non-ST Elevation MI:   The patient's TIMI risk score is 3, which indicates a 13% risk of all cause mortality, new or recurrent myocardial infarction or need for urgent revascularization in the next 14 days.      Signed, Nicolasa Ducking, NP 05/25/2023, 12:42 PM  For questions or updates, please contact   Please consult [email or web address] for contact info under Cardiology/STEMI.

## 2023-05-25 NOTE — Progress Notes (Signed)
PHARMACY -  BRIEF ANTIBIOTIC NOTE   Pharmacy has received consult(s) for Vancomycin from an ED provider.  The patient's profile has been reviewed for ht / wt / allergies / indication / available labs.    One time order(s) placed for: Vancomycin 1250 mg per pt wt: 55.8 kg  Further antibiotics/pharmacy consults should be ordered by admitting physician if indicated.                       Thank you, Otelia Sergeant, PharmD, [facility] 05/25/2023 12:34 AM

## 2023-05-25 NOTE — Progress Notes (Signed)
Pharmacy Antibiotic Note  Laurie Harris is a 82 y.o. female admitted on 05/24/2023 with sepsis.  Pharmacy has been consulted for Cefepime & Vancomycin dosing.  Plan: Cefepime 2 gm q12hr per indication & renal fxn.  Pt given Vancomycin 1250 mg once. Vancomycin 750 mg IV Q 24 hrs. Goal AUC 400-550. Expected AUC: 481 SCr used: 0.89, TBW 55.8 kg < IBW 59.3 kg  Pharmacy will continue to follow and will adjust abx dosing whenever warranted.  Temp (24hrs), Avg:97.1 F (36.2 C), Min:94.6 F (34.8 C), Max:98.6 F (37 C)   Recent Labs  Lab 05/23/23 0901 05/24/23 2327  WBC 4.5 6.9  CREATININE 1.01* 0.89  LATICACIDVEN  --  2.5*    Estimated Creatinine Clearance: 41.4 mL/min (by C-G formula based on SCr of 0.89 mg/dL).    Allergies  Allergen Reactions   Warfarin And Related     Possible stroke   Warfarin     Antimicrobials this admission: 9/21 Cefepime >>  9/21 Vancomycin >>  9/21 Flagyl >> x 1 dose  Microbiology results: 9/20 BCx: Pending  Thank you for allowing pharmacy to be a part of this patient's care.  Otelia Sergeant, PharmD, [facility] 05/25/2023 1:54 AM

## 2023-05-25 NOTE — H&P (Addendum)
History and Physical    [name]:[id number] DOB: 1941-07-14 DOA: 05/24/2023  Referring MD/NP/PA:   PCP: Glori Luis, MD   Patient coming from:  The patient is coming from assisted living facility   Chief Complaint: AMS and fall  HPI: Laurie Harris is a 82 y.o. female with medical history significant of ICH cerebella 2016, CKD stage IIIa, IDDM, HTN, HLD, thrombocytopenia, carotid artery stenosis, who presents with altered mental status in the fall.  Patient was hospitalized from 9/19 - 9/20 due to altered mental status.  Her mental status has been declining recently.  Patient had slurred speech and unsteady gait.  She had negative MRI of brain, and EEG was with diffuse slowing and no seizure.  TSH is slightly elevated 6.15, but free T4 was normal.  Vitamin B12 level was normal.  Psychiatry evaluation, (+) grief reaction. Pt  was discharged to ALF.  Per report, pt was noted to be more confused and has had multiple falls.  When I saw patient in ED, she is confused, not oriented x 3.  She cannot provide any medical history.  I have tried to call her daughter who did not pick up the phone.  Patient moves all extremities.  She has bruising left arm.  No active nausea, vomiting, diarrhea, respiratory distress, cough noted.  Does not seem to have chest pain or abdominal pain.  Not sure if patient has symptoms of UTI. Per EDP, at arrives to the ED. Pt is with eyes wide open, staring but responsive.   Data reviewed independently and ED Course: pt was found to have WBC 6.9, GFR> 60, trop  60,  lactic acid 2.5, negative PCR for COVID, flu and RSV, hypothermia with temperature 94.6, blood pressure 152/83, heart rate of 110, RR 23, oxygen sat 99% on room air.  Chest x-ray negative.  CT head negative for acute intracranial abnormalities.  X-ray of pelvis negative.  Patient is admitted to PCU as inpatient  CT-head: 1. No acute intracranial CT findings or interval changes. 2. Atrophy with  small-vessel disease. 3. Small right lateral frontal scalp contusion, was not seen on 05/23/2023. There is no depressed skull fracture.   EKG: I have personally reviewed. Sinus rhythm, QTc 499, LAD, anteroseptal infarction pattern, poor R wave progression   Review of Systems: Could not be reviewed due to altered mental status  Allergy:  Allergies  Allergen Reactions   Warfarin And Related     Possible stroke   Warfarin     Past Medical History:  Diagnosis Date   Carotid arterial disease (HCC)    a. 09/2016 Carotid U/S: R carotid bifurcation dzs of 50-69%, L carotid bifurcation dzs of <50%. Patent vertebral arteries w/ anegrade flow.   Cerebellar hemorrhage (HCC) 03/16/2015   Left Cerebellar Hemorrhage- See Care Everywhere Seen by [facility]     Chicken pox    CKD (chronic kidney disease), stage III (HCC)    functioning at 46%   Diabetes mellitus without complication (HCC)    Hemorrhagic stroke (HCC) 12/31/2014   a. in the setting of warfarin therapy.   History of appendicitis 08/07/2016   Hyperlipidemia    Hypertension    Syncope    a. 09/2016 Echo: EF 55-60%, no rwma, Gr1 DD.    Past Surgical History:  Procedure Laterality Date   ABCESS DRAINAGE  06/14/2016   appendix   APPENDECTOMY  06/13/2016   CATARACT EXTRACTION W/PHACO Left 09/04/2017   Procedure: CATARACT EXTRACTION PHACO AND  INTRAOCULAR LENS PLACEMENT (IOC) COMPLICATED LEFT DIABETIC;  Surgeon: Lockie Mola, MD;  Location: [facility];  Service: Ophthalmology;  Laterality: Left;  Diabetic - insulin   CATARACT EXTRACTION W/PHACO Right 10/02/2017   Procedure: CATARACT EXTRACTION PHACO AND INTRAOCULAR LENS PLACEMENT (IOC) COMPLICATED  RIGHT DIABETIC;  Surgeon: Lockie Mola, MD;  Location: [facility];  Service: Ophthalmology;  Laterality: Right;  Diabetic - insulin   CHOLECYSTECTOMY N/A 06/17/2018   Procedure: LAPAROSCOPIC CHOLECYSTECTOMY;  Surgeon: Sung Amabile, DO;  Location: [facility]  ORS;  Service: General;  Laterality: N/A;   COLONOSCOPY     LAPAROSCOPIC APPENDECTOMY N/A 06/08/2022   Procedure: APPENDECTOMY LAPAROSCOPIC;  Surgeon: Henrene Dodge, MD;  Location: [facility] ORS;  Service: General;  Laterality: N/A;   NECK SURGERY      Social History:  reports that she has never smoked. She has never used smokeless tobacco. She reports that she does not drink alcohol and does not use drugs.  Family History:  Family History  Problem Relation Age of Onset   Diabetes Mother    Dementia Mother    Heart disease Mother    Cancer Mother        lung   Cancer Father        colon   Stroke Father    Stroke Daughter    Hyperlipidemia Daughter    Breast cancer Neg Hx      Prior to Admission medications   Medication Sig Start Date End Date Taking? Authorizing Provider  acetaminophen (TYLENOL) 325 MG tablet Take 650 mg by mouth every 6 (six) hours as needed.    [provider]  aspirin EC 81 MG tablet Take 81 mg by mouth daily.    [provider]  BD AUTOSHIELD DUO 30G X 5 MM MISC USE TWICE A DAY. 10/04/22   Glori Luis, MD  benazepril (LOTENSIN) 5 MG tablet TAKE ONE TABLET EVERY DAY Patient taking differently: Take 5 mg by mouth daily. 05/12/19   Glori Luis, MD  calcium carbonate (OS-CAL) 600 MG TABS tablet Take one tablet by mouth once a day. Patient taking differently: Take 500 mg by mouth daily. Oyst shell calcium 500 mg tab once a day 12/01/20   Glori Luis, MD  Cholecalciferol (VITAMIN D3) 50 MCG (2000 UT) TABS Take 2,000 Units by mouth daily.    [provider]  fenofibrate 160 MG tablet Take 1 tablet (160 mg total) by mouth daily. 02/03/20   Glori Luis, MD  HUMALOG MIX 75/25 KWIKPEN (75-25) 100 UNIT/ML KwikPen INJECT 14 UNITS SUBCUTANEOUSLY TWICE A DAY WITH A MEAL. 09/24/22   Glori Luis, MD  hydrocortisone (ANUSOL-HC) 2.5 % rectal cream Place 1 Application rectally 2 (two) times daily. 05/03/22   Toney Reil, MD   Multiple Vitamin (MULTIVITAMIN WITH MINERALS) TABS tablet Take 1 tablet by mouth daily. Patient not taking: Reported on 05/23/2023    [provider]  NOVOFINE PLUS 32G X 4 MM MISC USE TWICE A DAY AS DIRECTED 02/22/20   Glori Luis, MD  pantoprazole (PROTONIX) 40 MG tablet TAKE 1 TABLET BY MOUTH EVERY-OTHER-DAY Patient taking differently: Take 40 mg by mouth every other day. 04/25/20   Glori Luis, MD  rosuvastatin (CRESTOR) 40 MG tablet TAKE (1) TABLET BY MOUTH ONCE A DAY Patient taking differently: Take 40 mg by mouth daily. 01/10/21   Glori Luis, MD    Physical Exam: Vitals:   05/24/23 2315 05/24/23 2337 05/25/23 0000 05/25/23 1610  BP: (!) 152/83  (!) 170/80 (!) 156/85  Pulse: (!) 110  (!) 110 (!) 116  Resp: 16  (!) 24 (!) 25  Temp:  (!) 94.6 F (34.8 C)    TempSrc:  Rectal    SpO2: 99%  99% 99%   General: Not in acute distress HEENT:       Eyes: PERRL, EOMI, no jaundice       ENT: No discharge from the ears and nose.       Neck: No JVD, no bruit, no mass felt. Heme: No neck lymph node enlargement. Cardiac: S1/S2, RRR, No murmurs, No gallops or rubs. Respiratory: No rales, wheezing, rhonchi or rubs. GI: Soft, nondistended, nontender, no organomegaly, BS present. GU: No hematuria Ext: No pitting leg edema bilaterally. 1+DP/PT pulse bilaterally. Musculoskeletal: No joint deformities, No joint redness or warmth, no limitation of ROM in spin. Skin: Has bruise to left forearm  Neuro: Confused, not oriented X3, cranial nerves II-XII grossly intact, moves all extremities  Psych: Patient is not psychotic, no suicidal or hemocidal ideation.  Labs on Admission: I have personally reviewed following labs and imaging studies  CBC: Recent Labs  Lab 05/23/23 0901 05/24/23 2327  WBC 4.5 6.9  NEUTROABS 3.1 5.5  HGB 12.3 12.5  HCT 34.7* 37.0  MCV 102.4* 101.6*  PLT 107* 108*   Basic Metabolic Panel: Recent Labs  Lab 05/23/23 0901 05/24/23 2327  NA  142 143  K 3.6 3.6  CL 110 106  CO2 23 21*  GLUCOSE 179* 164*  BUN 31* 30*  CREATININE 1.01* 0.89  CALCIUM 9.5 10.1   GFR: Estimated Creatinine Clearance: 41.4 mL/min (by C-G formula based on SCr of 0.89 mg/dL). Liver Function Tests: Recent Labs  Lab 05/23/23 0901 05/24/23 2327  AST 53* 57*  ALT 48* 49*  ALKPHOS 135* 128*  BILITOT 0.9 1.1  PROT 7.1 7.0  ALBUMIN 3.4* 3.7   No results for input(s): "LIPASE", "AMYLASE" in the last 168 hours. No results for input(s): "AMMONIA" in the last 168 hours. Coagulation Profile: Recent Labs  Lab 05/23/23 0901  INR 1.1   Cardiac Enzymes: No results for input(s): "CKTOTAL", "CKMB", "CKMBINDEX", "TROPONINI" in the last 168 hours. BNP (last 3 results) No results for input(s): "PROBNP" in the last 8760 hours. HbA1C: No results for input(s): "HGBA1C" in the last 72 hours. CBG: Recent Labs  Lab 05/23/23 1739 05/23/23 2207 05/24/23 0730 05/24/23 1134 05/24/23 2348  GLUCAP 86 217* 126* 193* 140*   Lipid Profile: No results for input(s): "CHOL", "HDL", "LDLCALC", "TRIG", "CHOLHDL", "LDLDIRECT" in the last 72 hours. Thyroid Function Tests: Recent Labs    05/23/23 0901  TSH 6.150*  FREET4 0.89   Anemia Panel: Recent Labs    05/23/23 0901  VITAMINB12 436   Urine analysis:    Component Value Date/Time   COLORURINE YELLOW (A) 05/24/2023 2327   APPEARANCEUR HAZY (A) 05/24/2023 2327   APPEARANCEUR Clear 12/31/2014 1822   LABSPEC 1.018 05/24/2023 2327   LABSPEC 1.009 12/31/2014 1822   PHURINE 5.0 05/24/2023 2327   GLUCOSEU NEGATIVE 05/24/2023 2327   GLUCOSEU >=500 12/31/2014 1822   HGBUR NEGATIVE 05/24/2023 2327   BILIRUBINUR NEGATIVE 05/24/2023 2327   BILIRUBINUR neg 05/10/2023 1358   BILIRUBINUR Negative 12/31/2014 1822   KETONESUR NEGATIVE 05/24/2023 2327   PROTEINUR 30 (A) 05/24/2023 2327   UROBILINOGEN 0.2 05/10/2023 1358   NITRITE NEGATIVE 05/24/2023 2327   LEUKOCYTESUR NEGATIVE 05/24/2023 2327   LEUKOCYTESUR  Negative 12/31/2014 1822   Sepsis Labs: @  LABRCNTIP(procalcitonin:4,lacticidven:4) ) Recent Results (from the past 240 hour(s))  SARS Coronavirus 2 by RT PCR (hospital order, performed in [facility] hospital lab) *cepheid single result test* Anterior Nasal Swab     Status: None   Collection Time: 05/23/23 11:20 AM   Specimen: Anterior Nasal Swab  Result Value Ref Range Status   SARS Coronavirus 2 by RT PCR NEGATIVE NEGATIVE Final    Comment: (NOTE) SARS-CoV-2 target nucleic acids are NOT DETECTED.  The SARS-CoV-2 RNA is generally detectable in upper and lower respiratory specimens during the acute phase of infection. The lowest concentration of SARS-CoV-2 viral copies this assay can detect is 250 copies / mL. A negative result does not preclude SARS-CoV-2 infection and should not be used as the sole basis for treatment or other patient management decisions.  A negative result may occur with improper specimen collection / handling, submission of specimen other than nasopharyngeal swab, presence of viral mutation(s) within the areas targeted by this assay, and inadequate number of viral copies (<250 copies / mL). A negative result must be combined with clinical observations, patient history, and epidemiological information.  Fact Sheet for Patients:   RoadLapTop.co.za  Fact Sheet for Healthcare Providers: http://kim-miller.com/  This test is not yet approved or  cleared by the [place] FDA and has been authorized for detection and/or diagnosis of SARS-CoV-2 by FDA under an Emergency Use Authorization (EUA).  This EUA will remain in effect (meaning this test can be used) for the duration of the COVID-19 declaration under Section 564(b)(1) of the Act, 21 U.S.C. section 360bbb-3(b)(1), unless the authorization is terminated or revoked sooner.  Performed at [facility], [address] [place], [place] [address]   Resp  panel by RT-PCR (RSV, Flu A&B, Covid) Anterior Nasal Swab     Status: None   Collection Time: 05/24/23 11:27 PM   Specimen: Anterior Nasal Swab  Result Value Ref Range Status   SARS Coronavirus 2 by RT PCR NEGATIVE NEGATIVE Final    Comment: (NOTE) SARS-CoV-2 target nucleic acids are NOT DETECTED.  The SARS-CoV-2 RNA is generally detectable in upper respiratory specimens during the acute phase of infection. The lowest concentration of SARS-CoV-2 viral copies this assay can detect is 138 copies/mL. A negative result does not preclude SARS-Cov-2 infection and should not be used as the sole basis for treatment or other patient management decisions. A negative result may occur with  improper specimen collection/handling, submission of specimen other than nasopharyngeal swab, presence of viral mutation(s) within the areas targeted by this assay, and inadequate number of viral copies(<138 copies/mL). A negative result must be combined with clinical observations, patient history, and epidemiological information. The expected result is Negative.  Fact Sheet for Patients:  BloggerCourse.com  Fact Sheet for Healthcare Providers:  SeriousBroker.it  This test is no t yet approved or cleared by the [place] FDA and  has been authorized for detection and/or diagnosis of SARS-CoV-2 by FDA under an Emergency Use Authorization (EUA). This EUA will remain  in effect (meaning this test can be used) for the duration of the COVID-19 declaration under Section 564(b)(1) of the Act, 21 U.S.C.section 360bbb-3(b)(1), unless the authorization is terminated  or revoked sooner.       Influenza A by PCR NEGATIVE NEGATIVE Final   Influenza B by PCR NEGATIVE NEGATIVE Final    Comment: (NOTE) The Xpert Xpress SARS-CoV-2/FLU/RSV plus assay is intended as an aid in the diagnosis of influenza from Nasopharyngeal swab specimens and should not be  used as a  sole basis for treatment. Nasal washings and aspirates are unacceptable for Xpert Xpress SARS-CoV-2/FLU/RSV testing.  Fact Sheet for Patients: BloggerCourse.com  Fact Sheet for Healthcare Providers: SeriousBroker.it  This test is not yet approved or cleared by the [place] FDA and has been authorized for detection and/or diagnosis of SARS-CoV-2 by FDA under an Emergency Use Authorization (EUA). This EUA will remain in effect (meaning this test can be used) for the duration of the COVID-19 declaration under Section 564(b)(1) of the Act, 21 U.S.C. section 360bbb-3(b)(1), unless the authorization is terminated or revoked.     Resp Syncytial Virus by PCR NEGATIVE NEGATIVE Final    Comment: (NOTE) Fact Sheet for Patients: BloggerCourse.com  Fact Sheet for Healthcare Providers: SeriousBroker.it  This test is not yet approved or cleared by the [place] FDA and has been authorized for detection and/or diagnosis of SARS-CoV-2 by FDA under an Emergency Use Authorization (EUA). This EUA will remain in effect (meaning this test can be used) for the duration of the COVID-19 declaration under Section 564(b)(1) of the Act, 21 U.S.C. section 360bbb-3(b)(1), unless the authorization is terminated or revoked.  Performed at [facility], [address], [place], [place] [address]      Radiological Exams on Admission: CT Head Wo Contrast  Result Date: 05/25/2023 CLINICAL DATA:  Mental status change, unknown cause. Seen here 05/23/2023 for left-sided stroke symptoms but had a negative brain MRI. EXAM: CT HEAD WITHOUT CONTRAST TECHNIQUE: Contiguous axial images were obtained from the base of the skull through the vertex without intravenous contrast. RADIATION DOSE REDUCTION: This exam was performed according to the departmental dose-optimization program which includes automated  exposure control, adjustment of the mA and/or kV according to patient size and/or use of iterative reconstruction technique. COMPARISON:  Head CT and MRI brain both 05/23/2023, most recent was the MRI 1:00 p.m. FINDINGS: Brain: There is mild to moderate global atrophy with atrophic ventriculomegaly and mild small vessel disease of the cerebral white matter with chronic mineralization in the basal ganglia. No new asymmetry is seen concerning for a cortical based acute infarct, hemorrhage, mass or mass effect. There is no midline shift. The basal cisterns are clear. Vascular: Both carotid siphons and distal vertebral arteries are heavily calcified. No hyperdense central vessel is seen. Skull: Negative for fractures or focal lesions, but there is a new finding of a small right lateral frontal scalp contusion, not seen previously. Sinuses/Orbits: Clear sinuses and mastoids. Reverse S shaped nasal septum with right-sided spurring. Old lens extractions with otherwise negative orbits. Other: None. IMPRESSION: 1. No acute intracranial CT findings or interval changes. 2. Atrophy with small-vessel disease. 3. Small right lateral frontal scalp contusion, was not seen on 05/23/2023. There is no depressed skull fracture. Electronically Signed   By: Almira Bar M.D.   On: 05/25/2023 00:57   DG Chest Port 1 View  Result Date: 05/25/2023 CLINICAL DATA:  Altered mental status with multiple fall EXAM: PORTABLE CHEST 1 VIEW COMPARISON:  09/25/2022 FINDINGS: The heart size and mediastinal contours are within normal limits. Both lungs are clear. The visualized skeletal structures are unremarkable. IMPRESSION: No active disease. Electronically Signed   By: Jasmine Pang M.D.   On: 05/25/2023 00:15   DG Pelvis Portable  Result Date: 05/25/2023 CLINICAL DATA:  Altered mental status multiple fall EXAM: PORTABLE PELVIS 1-2 VIEWS COMPARISON:  CT 06/18/2022 FINDINGS: SI joints are non widened. Pubic symphysis and rami appear intact.  Bilateral hip degenerative change. No fracture  or malalignment IMPRESSION: No acute osseous abnormality. Electronically Signed   By: Jasmine Pang M.D.   On: 05/25/2023 00:14   EEG adult  Result Date: 05/24/2023 Charlsie Quest, MD     05/24/2023  3:19 PM Patient Name: Laurie Harris MRN: [id number] Epilepsy Attending: Charlsie Quest Referring Physician/Provider: Emeline General, MD Date: 05/24/2023 Duration: 30.23 mins Patient history: 82 year old female with altered mental status.  EEG to alert for seizure. Level of alertness: Awake, asleep AEDs during EEG study: None Technical aspects: This EEG study was done with scalp electrodes positioned according to the 10-20 International system of electrode placement. Electrical activity was reviewed with band pass filter of 1-70Hz , sensitivity of 7 uV/mm, display speed of 39mm/sec with a 60Hz  notched filter applied as appropriate. EEG data were recorded continuously and digitally stored.  Video monitoring was available and reviewed as appropriate. Description: The posterior dominant rhythm consists of 8-9 Hz activity of moderate voltage (25-35 uV) seen predominantly in posterior head regions, symmetric and reactive to eye opening and eye closing. Sleep was characterized by vertex waves, sleep spindles (12 to 14 Hz), maximal frontocentral region. EEG showed intermittent generalized 3 to 6 Hz theta-delta slowing. Hyperventilation and photic stimulation were not performed.   ABNORMALITY - Intermittent slow, generalized IMPRESSION: This study is suggestive of mild diffuse encephalopathy. No seizures or epileptiform discharges were seen throughout the recording. Charlsie Quest   MR BRAIN WO CONTRAST  Result Date: 05/23/2023 CLINICAL DATA:  Neuro deficit, acute, stroke suspected EXAM: MRI HEAD WITHOUT CONTRAST TECHNIQUE: Multiplanar, multiecho pulse sequences of the brain and surrounding structures were obtained without intravenous contrast. COMPARISON:  Same-day CT  head, brain MR 05/03/2023 FINDINGS: Brain: Negative for an acute infarct. No hemorrhage. No hydrocephalus. No extra-axial fluid collection. Mass effect. No mass lesion. Chronic pontine and left cerebellar hemorrhages. There is sequela of mild overall chronic microvascular ischemic change. Vascular: Normal flow voids. Skull and upper cervical spine: Degenerative changes in the upper cervical spine. Sinuses/Orbits: No middle ear or mastoid effusion. Paranasal sinuses are clear. Bilateral lens replacement. Orbits are otherwise unremarkable. Other: None. IMPRESSION: No acute intracranial process. Electronically Signed   By: Lorenza Cambridge M.D.   On: 05/23/2023 15:22   CT HEAD WO CONTRAST ( )  Result Date: 05/23/2023 CLINICAL DATA:  Neuro deficit, acute, stroke suspected EXAM: CT HEAD WITHOUT CONTRAST TECHNIQUE: Contiguous axial images were obtained from the base of the skull through the vertex without intravenous contrast. RADIATION DOSE REDUCTION: This exam was performed according to the departmental dose-optimization program which includes automated exposure control, adjustment of the mA and/or kV according to patient size and/or use of iterative reconstruction technique. COMPARISON:  CT Head 05/10/23 FINDINGS: Brain: No hemorrhage. No hydrocephalus. No extra-axial fluid collection. No CT evidence of an acute infarct. Sequela of mild chronic microvascular ischemic change. Generalized volume loss. Vascular: No hyperdense vessel or unexpected calcification. Skull: Normal. Negative for fracture or focal lesion. Sinuses/Orbits: No middle ear or mastoid effusion. Paranasal sinuses are clear. There are osseous changes suggestive of chronic left maxillary sinusitis. Bilateral lens replacement. Orbits are otherwise unremarkable. Other: None. IMPRESSION: No hemorrhage or CT evidence of an acute cortical infarct. Electronically Signed   By: Lorenza Cambridge M.D.   On: 05/23/2023 12:14      Assessment/Plan Principal  Problem:   Acute metabolic encephalopathy Active Problems:   Fall   SIRS (systemic inflammatory response syndrome) (HCC)   Myocardial injury   Type II diabetes mellitus with renal manifestations (HCC)  Benign essential HTN   Hyperlipidemia   Thrombocytopenia (HCC)   Stage 3a chronic kidney disease (CKD) (HCC)   Assessment and Plan:  Acute metabolic encephalopathy: Etiology is not clear.  Patient was just discharged on 9/20 and had negative MRI of brain.  EEG was negative for seizure.  T head negative for acute intracranial abnormalities today.  Likely has multifactorial etiology including myocardial injury and SIRS. Patient may have undiagnosed dementia.   Now patient has SIRS without clear source of infection.   -Admitted to PCU as inpatient -Frequent neurocheck -Fall precaution -Treat underlying issues as below  Fall -PT/OT -Fall precaution  SIRS (systemic inflammatory response syndrome) [facility]): Patient meets criteria for SIRS with heart rate of 110, RR 23, hypothermic with temperature 94.6, no leukocytosis.  Lactic acid 2.5.  Urinalysis negative except for rare bacteria.  Chest x-ray negative.  No source of infection identified.  Will treat patient as SIRS now. -Empiric antibiotics: Vancomycin and cefepime -Follow-up of blood culture -IV fluid: 1.75 LR, then 75 cc/h -Trend lactic acid level -Check procalcitonin level -Bair hugger for hypothermia  Myocardial injury: Troponin 60.  Patient had LDL 41 and A1c 7.2 on 05/03/2023.   -Aspirin, Crestor -Trend troponin  Type II diabetes mellitus with renal manifestations [facility]): Patient is taking 75/25 insulin 14 units twice daily.  Recent A1c 7.2, poorly controlled. -Sliding scale insulin -75/25 insulin 10 unit twice daily  Benign essential HTN -IV hydralazine as needed -Hold Lotensin since patient has risk of developing hypotension due to SIRS  Hyperlipidemia -Crestor, fenofibrate  Thrombocytopenia (HCC): This is chronic  issue.  Platelet 108 -Follow-up with CBC  Stage 3a chronic kidney disease (CKD) (HCC): Renal function stable, GFR> 60 -Follow-up with BMP      DVT ppx: SCD  Code Status: DNR  Family Communication: I have tried to call her daughter who did not pick up the phone.  I left a message  Disposition Plan:  Anticipate discharge back to previous environment, ALF  Consults called:  none  Admission status and Level of care: Progressive:    as inpt      Dispo: The patient is from: ALF              Anticipated d/c is to: ALF              Anticipated d/c date is: 2 days              Patient currently is not medically stable to d/c.    Severity of Illness:  The appropriate patient status for this patient is INPATIENT. Inpatient status is judged to be reasonable and necessary in order to provide the required intensity of service to ensure the patient's safety. The patient's presenting symptoms, physical exam findings, and initial radiographic and laboratory data in the context of their chronic comorbidities is felt to place them at high risk for further clinical deterioration. Furthermore, it is not anticipated that the patient will be medically stable for discharge from the hospital within 2 midnights of admission.   * I certify that at the point of admission it is my clinical judgment that the patient will require inpatient hospital care spanning beyond 2 midnights from the point of admission due to high intensity of service, high risk for further deterioration and high frequency of surveillance required.*       Date of Service 05/25/2023    Lorretta Harp [facility]   If 7PM-7AM, please contact night-coverage [email or web address] 05/25/2023, 1:32 AM

## 2023-05-25 NOTE — Assessment & Plan Note (Signed)
Patient with waxing and waning mental status over the past few weeks, rapid cognitive decline since the death of her son.  All imaging negative including MRI brain.  EEG was done during most recent admission and it was negative for any seizure. Has outpatient neurology evaluation scheduled for 06/17/2023. Currently appears to be at baseline. -Continue to monitor

## 2023-05-25 NOTE — Assessment & Plan Note (Addendum)
Troponin elevated, peaked at 3148 and now started trending down.  No chest pain or acute EKG changes.  Cardiology was consulted and she was started on heparin infusion.  Likely demand ischemia. -Cardiology is recommending continue heparin for 48 hours -Medical management as she is not a candidate for any invasive evaluation or treatment. -Restarting home metoprolol at a lower dose -Echocardiogram pending -Continue aspirin and statin

## 2023-05-25 NOTE — Progress Notes (Signed)
CROSS COVER NOTE  NAME: Laurie Harris MRN: [id number] DOB : [birth year]    Concern as stated by nurse / staff   Troponin 1176 up from 138     Pertinent findings on chart review: Patient admitted with metabolic encephalopathy and concern for sepsis. Troponin on admission 60  Assessment and  Interventions   Assessment: Patient calm, per nurse and daughter, no pain indicators  EKG SR no STE  Plan: Heparin per pharmacy consult for ACS Day team to consult cardiology Continue to trend troponin levels       Donnie Mesa NP [facility] Cross Cover 7pm-7am - check amion for availability Pager [phone]

## 2023-05-25 NOTE — Hospital Course (Addendum)
Taken from H&P.   Laurie Harris is a 82 y.o. female with medical history significant of ICH cerebella 2016, CKD stage IIIa, IDDM, HTN, HLD, thrombocytopenia, carotid artery stenosis, who presents with altered mental status and fall.   Patient was hospitalized from 9/19 - 9/20 due to altered mental status.  Her mental status has been declining recently.  Patient had slurred speech and unsteady gait.  She had negative MRI of brain, and EEG was with diffuse slowing and no seizure.  TSH is slightly elevated 6.15, but free T4 was normal.  Vitamin B12 level was normal.   Psychiatry evaluation, (+) grief reaction. Pt  was discharged to ALF.   Per report, pt was noted to be more confused and has had multiple falls.  Data reviewed and ED Course: pt was found to have WBC 6.9, GFR> 60, trop  60,  lactic acid 2.5, negative PCR for COVID, flu and RSV, hypothermia with temperature 94.6, blood pressure 152/83, heart rate of 110, RR 23, oxygen sat 99% on room air.  Chest x-ray negative.  CT head negative for acute intracranial abnormalities.  X-ray of pelvis negative.   EKG with sinus rhythm, QTc 499, LAD, anteroseptal infarction pattern, poor R wave progression.  Overnight due to rising troponin, she was started on heparin infusion.  Patient also met sepsis criteria due to hypothermia and tachycardia, no obvious source of infection.  She was given Lawyer and started on broad-spectrum antibiotics.  9/21: Improving hypothermia with temperature at 96 now, saturating well on room air.  Procalcitonin 0.13, lactic acidosis resolved.  Due to worsening troponin cardiology was also consulted, likely demand ischemia with hypothermia. Patient has an outpatient appointment with neurology on 06/17/2023 for her rapidly declining mental status over the past few weeks.  Patient with a steep decline since 2024-07-11after the death of her son.  Echocardiogram was also ordered, cardiology is recommending conservative  management for now. UDS was positive for ecstasy on 05/23/2023??  Palliative care was also consulted.  9/22: Vital stable but clinically seems worsening, started having difficulty with swallowing and appears more lethargic.  Swallow evaluation ordered.  Seems like patient is approaching end-of-life.  Palliative care to discuss with family regarding transitioning to comfort care. 9/23: Patient was transition to full comfort care by family.  She does not qualify for inpatient hospice services at this time.  Family to decide about proceeding with hospice at her facility or take her back home with hospice. 9/24: Currently medically stable with waxing and waning mental status.  Still unable to eat regular food.  Unfortunately her prior assisted living facility cannot take her back with hospice.  She does not qualify going to an hospice facility, [facility] and daughter is trying to explore other options. 9/25.  Patient needed help eating. 9/26.  Patient less talkative today.  Has her mouth open.  Daughter states last night that she was seeing people that already passed away. 06/22/23.  No hospice home bed availability currently. 9/28  Patient was not accepted to the hospice home.  Continue end-of-life care here. 9/29.  Patient moved her arms to sternal rub.  Lost IV access.  Accepted to hospice home for this afternoon.

## 2023-05-25 NOTE — Progress Notes (Signed)
CODE SEPSIS - PHARMACY COMMUNICATION  **Broad Spectrum Antibiotics should be administered within 1 hour of Sepsis diagnosis**  Time Code Sepsis Called/Page Received: 0035  Antibiotics Ordered: Cefepime, Flagyl, Vancomycin  Time of 1st antibiotic administration: 0113  Otelia Sergeant, PharmD, [facility] 05/25/2023 12:35 AM

## 2023-05-25 NOTE — Progress Notes (Signed)
ANTICOAGULATION CONSULT NOTE  Pharmacy Consult for heparin infusion Indication: ACS / STEMI  Allergies  Allergen Reactions   Warfarin And Related     Possible stroke   Warfarin     Patient Measurements:   Heparin Dosing Weight: 55.8 kg  Vital Signs: Temp: 96 F (35.6 C) (09/21 0622) Temp Source: Axillary (09/21 0227) BP: 144/76 (09/21 0622) Pulse Rate: 101 (09/21 0622)  Labs: Recent Labs    05/23/23 0901 05/24/23 2327 05/25/23 0258 05/25/23 0442  HGB 12.3 12.5 11.6*  --   HCT 34.7* 37.0 33.6*  --   PLT 107* 108* 101*  --   APTT 34  --  38*  --   LABPROT 14.7  --  15.9*  --   INR 1.1  --  1.2  --   CREATININE 1.01* 0.89 0.82  --   TROPONINIHS  --  60* 138* 1,176*    Estimated Creatinine Clearance: 45 mL/min (by C-G formula based on SCr of 0.82 mg/dL).   Medical History: Past Medical History:  Diagnosis Date   Carotid arterial disease (HCC)    a. 09/2016 Carotid U/S: R carotid bifurcation dzs of 50-69%, L carotid bifurcation dzs of <50%. Patent vertebral arteries w/ anegrade flow.   Cerebellar hemorrhage (HCC) 03/16/2015   Left Cerebellar Hemorrhage- See Care Everywhere Seen by [facility]     Chicken pox    CKD (chronic kidney disease), stage III (HCC)    functioning at 46%   Diabetes mellitus without complication (HCC)    Hemorrhagic stroke (HCC) 12/31/2014   a. in the setting of warfarin therapy.   History of appendicitis 08/07/2016   Hyperlipidemia    Hypertension    Syncope    a. 09/2016 Echo: EF 55-60%, no rwma, Gr1 DD.    Assessment: Pt is a 82 yo female returning to ED following D/C on 9/20 d/t AMS found with elevated Troponin I level, trending up.  Goal of Therapy:  Heparin level 0.3-0.7 units/ml Monitor platelets by anticoagulation protocol: Yes   Plan:  Bolus 3300 units x 1 Start heparin infusion at 750 units/hr Will check HL in 8 hr after start of infusion CBC daily while on heparin  Otelia Sergeant, PharmD,  [facility] 05/25/2023 6:25 AM

## 2023-05-25 NOTE — Assessment & Plan Note (Signed)
Patient with chronic thrombocytopenia, platelets at 108. -Continue to monitor

## 2023-05-25 NOTE — Sepsis Progress Note (Signed)
Elink monitoring for the code sepsis protocol.  

## 2023-05-26 DIAGNOSIS — I2489 Other forms of acute ischemic heart disease: Secondary | ICD-10-CM | POA: Diagnosis not present

## 2023-05-26 DIAGNOSIS — E1129 Type 2 diabetes mellitus with other diabetic kidney complication: Secondary | ICD-10-CM

## 2023-05-26 DIAGNOSIS — E7800 Pure hypercholesterolemia, unspecified: Secondary | ICD-10-CM

## 2023-05-26 DIAGNOSIS — R4182 Altered mental status, unspecified: Secondary | ICD-10-CM | POA: Diagnosis not present

## 2023-05-26 DIAGNOSIS — R6510 Systemic inflammatory response syndrome (SIRS) of non-infectious origin without acute organ dysfunction: Secondary | ICD-10-CM | POA: Diagnosis not present

## 2023-05-26 DIAGNOSIS — R296 Repeated falls: Secondary | ICD-10-CM | POA: Diagnosis not present

## 2023-05-26 DIAGNOSIS — A419 Sepsis, unspecified organism: Secondary | ICD-10-CM | POA: Diagnosis not present

## 2023-05-26 DIAGNOSIS — G9341 Metabolic encephalopathy: Secondary | ICD-10-CM | POA: Diagnosis not present

## 2023-05-26 DIAGNOSIS — Z794 Long term (current) use of insulin: Secondary | ICD-10-CM

## 2023-05-26 DIAGNOSIS — W19XXXA Unspecified fall, initial encounter: Secondary | ICD-10-CM | POA: Diagnosis not present

## 2023-05-26 DIAGNOSIS — Z515 Encounter for palliative care: Secondary | ICD-10-CM | POA: Diagnosis not present

## 2023-05-26 LAB — GLUCOSE, CAPILLARY
Glucose-Capillary: 102 mg/dL — ABNORMAL HIGH (ref 70–99)
Glucose-Capillary: 98 mg/dL (ref 70–99)

## 2023-05-26 LAB — HEPARIN LEVEL (UNFRACTIONATED): Heparin Unfractionated: 0.84 IU/mL — ABNORMAL HIGH (ref 0.30–0.70)

## 2023-05-26 MED ORDER — ACETAMINOPHEN 650 MG RE SUPP: 650.0000 mg | Freq: Four times a day (QID) | RECTAL | Status: DC | PRN

## 2023-05-26 MED ORDER — MORPHINE SULFATE (PF) 2 MG/ML IV SOLN
1.0000 mg | INTRAVENOUS | Status: DC | PRN
  Administered 2023-05-30 – 2023-06-01 (×4): 1 mg via INTRAVENOUS
  Filled 2023-05-26 (×4): qty 1

## 2023-05-26 MED ORDER — LORAZEPAM 2 MG/ML PO CONC
1.0000 mg | ORAL | Status: DC | PRN
  Administered 2023-05-31 – 2023-06-02 (×2): 1 mg via SUBLINGUAL
  Filled 2023-05-26 (×2): qty 1

## 2023-05-26 MED ORDER — HALOPERIDOL 0.5 MG PO TABS: 0.5000 mg | ORAL_TABLET | ORAL | Status: DC | PRN

## 2023-05-26 MED ORDER — GLYCOPYRROLATE 0.2 MG/ML IJ SOLN: 0.2000 mg | INTRAMUSCULAR | Status: DC | PRN

## 2023-05-26 MED ORDER — ONDANSETRON 4 MG PO TBDP: 4.0000 mg | ORAL_TABLET | Freq: Four times a day (QID) | ORAL | Status: DC | PRN

## 2023-05-26 MED ORDER — HALOPERIDOL LACTATE 5 MG/ML IJ SOLN
0.5000 mg | INTRAMUSCULAR | Status: DC | PRN
  Administered 2023-05-28 – 2023-06-01 (×3): 0.5 mg via INTRAVENOUS
  Filled 2023-05-26 (×3): qty 1

## 2023-05-26 MED ORDER — ONDANSETRON HCL 4 MG/2ML IJ SOLN: 4.0000 mg | Freq: Four times a day (QID) | INTRAMUSCULAR | Status: DC | PRN

## 2023-05-26 MED ORDER — LORAZEPAM 2 MG/ML IJ SOLN
1.0000 mg | INTRAMUSCULAR | Status: DC | PRN
  Administered 2023-05-28: 1 mg via INTRAVENOUS
  Filled 2023-05-26: qty 1

## 2023-05-26 MED ORDER — POLYVINYL ALCOHOL 1.4 % OP SOLN: 1.0000 [drp] | Freq: Four times a day (QID) | OPHTHALMIC | Status: DC | PRN

## 2023-05-26 MED ORDER — GLYCOPYRROLATE 1 MG PO TABS: 1.0000 mg | ORAL_TABLET | ORAL | Status: DC | PRN

## 2023-05-26 MED ORDER — HALOPERIDOL LACTATE 2 MG/ML PO CONC: 0.5000 mg | ORAL | Status: DC | PRN

## 2023-05-26 MED ORDER — BIOTENE DRY MOUTH MT LIQD: 15.0000 mL | OROMUCOSAL | Status: DC | PRN

## 2023-05-26 MED ORDER — ACETAMINOPHEN 325 MG PO TABS: 650.0000 mg | ORAL_TABLET | Freq: Four times a day (QID) | ORAL | Status: DC | PRN

## 2023-05-26 MED ORDER — LORAZEPAM 1 MG PO TABS: 1.0000 mg | ORAL_TABLET | ORAL | Status: DC | PRN

## 2023-05-26 NOTE — Progress Notes (Signed)
ANTICOAGULATION CONSULT NOTE  Pharmacy Consult for heparin infusion Indication: ACS / STEMI  Allergies  Allergen Reactions   Warfarin And Related     Possible stroke   Warfarin     Patient Measurements:   Heparin Dosing Weight: 55.8 kg  Vital Signs: Temp: 97.1 F (36.2 C) (09/22 0758) Temp Source: Axillary (09/22 0758) BP: 126/68 (09/22 0758) Pulse Rate: 103 (09/22 0758)  Labs: Recent Labs    05/24/23 2327 05/25/23 0258 05/25/23 0442 05/25/23 0801 05/25/23 0956 05/25/23 1143 05/25/23 1429 05/25/23 2243 05/26/23 0858  HGB 12.5 11.6*  --   --   --   --   --   --   --   HCT 37.0 33.6*  --   --   --   --   --   --   --   PLT 108* 101*  --   --   --   --   --   --   --   APTT  --  38*  --   --   --   --   --   --   --   LABPROT  --  15.9*  --   --   --   --   --   --   --   INR  --  1.2  --   --   --   --   --   --   --   HEPARINUNFRC  --   --   --   --   --   --  0.69 >1.10* 0.84*  CREATININE 0.89 0.82  --   --   --   --   --   --   --   TROPONINIHS 60* 138*   < > 3,148* 3,014* 2,495*  --   --   --    < > = values in this interval not displayed.    Estimated Creatinine Clearance: 45 mL/min (by C-G formula based on SCr of 0.82 mg/dL).   Medical History: Past Medical History:  Diagnosis Date   Carotid arterial disease (HCC)    a. 09/2016 Carotid U/S: R carotid bifurcation dzs of 50-69%, L carotid bifurcation dzs of <50%. Patent vertebral arteries w/ anegrade flow.   Cerebellar hemorrhage (HCC) 03/16/2015   Left Cerebellar Hemorrhage- See Care Everywhere Seen by [facility]     Chicken pox    CKD (chronic kidney disease), stage III (HCC)    functioning at 46%   Diabetes mellitus without complication (HCC)    Hemorrhagic stroke (HCC) 12/31/2014   a. in the setting of warfarin therapy.   History of appendicitis 08/07/2016   Hyperlipidemia    Hypertension    Syncope    a. 09/2016 Echo: EF 55-60%, no rwma, Gr1 DD.    Assessment: Pt is a 82 yo female  returning to ED following D/C on 9/20 d/t AMS found with elevated Troponin I level, trending up.  9/21 1429 HL 0.69 Therapeutic 9/21 2243 HL >1.1 Supratherapeutic 9/22 0858 HL 0.84 Supratherapeutic  Goal of Therapy:  Heparin level 0.3-0.7 units/ml Monitor platelets by anticoagulation protocol: Yes   Plan:  Decrease heparin infusion to 500 units/hr Will recheck HL in 8 hr after rate change CBC daily while on heparin  Bettey Costa, PharmD Clinical Pharmacist 05/26/2023 11:51 AM

## 2023-05-26 NOTE — Evaluation (Signed)
Physical Therapy Evaluation Patient Details Name: Laurie Harris MRN: [id number] DOB: 04-09-1941 Today's Date: 05/26/2023  History of Present Illness  Pt admitted with acute metabolic encephalopathy with complaints of AMS and multiple falls. Recent admission earlier this week. History includes ICH, CKD, DM, HTN, HLD, and CAD. Cleared by cardio for participation.  Clinical Impression  Pt is a pleasant 82 year old female who was admitted for AMS and acute metabolic encephalopathy. Pt performs bed mobility with min assist to get EOB, able to lateral scoot and then required max assist for returning to supine. Pt doesn't demonstrate cognition to tolerate OOB mobility to chair at this time. Breakfast arrived during session and this writer assisted with set up and proper HOB elevation for intake of food. Discussed with daughter at bedside. Pt demonstrates deficits with cognition/strength/mobility. Was previously walking earlier this week. Would benefit from skilled PT to address above deficits and promote optimal return to PLOF. Pt will continue to receive skilled PT services while admitted and will defer to TOC/care team for updates regarding disposition planning.       If plan is discharge home, recommend the following: A lot of help with walking and/or transfers;A lot of help with bathing/dressing/bathroom;Direct supervision/assist for medications management;Assist for transportation;Help with stairs or ramp for entrance;Supervision due to cognitive status   Can travel by private vehicle   No    Equipment Recommendations  (TBD)  Recommendations for Other Services       Functional Status Assessment Patient has had a recent decline in their functional status and/or demonstrates limited ability to make significant improvements in function in a reasonable and predictable amount of time     Precautions / Restrictions Precautions Precautions: Fall Restrictions Weight Bearing Restrictions: No       Mobility  Bed Mobility Overal bed mobility: Needs Assistance Bed Mobility: Supine to Sit, Sit to Supine     Supine to sit: Min assist Sit to supine: Max assist   General bed mobility comments: doesn't follow commands, however with guidance can sit at EOB, progressed to close CGA for EOB tolerance. Able to maintain for extended time. Needs significant assist for returning back to bed and positioning.    Transfers Overall transfer level: Needs assistance Equipment used: None               General transfer comment: lateral scoots up towards HOB. Needs min assist and heavy cues for sequencing    Ambulation/Gait               General Gait Details: not safe to perform at this time.  Stairs            Wheelchair Mobility     Tilt Bed    Modified Rankin (Stroke Patients Only)       Balance Overall balance assessment: Needs assistance Sitting-balance support: Feet supported Sitting balance-Leahy Scale: Fair                                       Pertinent Vitals/Pain Pain Assessment Pain Assessment: No/denies pain    Home Living Family/patient expects to be discharged to:: Assisted living                 Home Equipment: Rollator (4 wheels) Additional Comments: [facility] resident at [facility]    Prior Function Prior Level of Function : Independent/Modified Independent  Mobility Comments: Able to walk by herself with rollator to/from dining hall ADLs Comments: reports assistance for bathing     Extremity/Trunk Assessment   Upper Extremity Assessment Upper Extremity Assessment: Generalized weakness    Lower Extremity Assessment Lower Extremity Assessment: Generalized weakness (B LE grossly 3/5)       Communication   Communication Communication: No apparent difficulties  Cognition Arousal: Alert Behavior During Therapy: Flat affect Overall Cognitive Status: History of cognitive impairments - at  baseline                                 General Comments: able to speak and reports her therapist looks familiar and she is at the bank. Unable to recognize family members in room        General Comments      Exercises     Assessment/Plan    PT Assessment Patient needs continued PT services  PT Problem List Decreased strength;Decreased coordination;Decreased range of motion;Decreased activity tolerance;Decreased balance;Decreased mobility       PT Treatment Interventions Balance training;DME instruction;Gait training;Stair training;Functional mobility training;Therapeutic activities;Therapeutic exercise    PT Goals (Current goals can be found in the Care Plan section)  Acute Rehab PT Goals Patient Stated Goal: unable to state PT Goal Formulation: With family Time For Goal Achievement: 06/09/23 Potential to Achieve Goals: Fair    Frequency Min 1X/week     Co-evaluation               AM-PAC PT "6 Clicks" Mobility  Outcome Measure Help needed turning from your back to your side while in a flat bed without using bedrails?: A Little Help needed moving from lying on your back to sitting on the side of a flat bed without using bedrails?: A Little Help needed moving to and from a bed to a chair (including a wheelchair)?: A Lot Help needed standing up from a chair using your arms (e.g., wheelchair or bedside chair)?: A Lot Help needed to walk in hospital room?: Total Help needed climbing 3-5 steps with a railing? : Total 6 Click Score: 12    End of Session   Activity Tolerance: Patient tolerated treatment well Patient left: in bed;with bed alarm set;with family/visitor present (bed in recliner position with prep for breakfast) Nurse Communication: Mobility status PT Visit Diagnosis: Other abnormalities of gait and mobility (R26.89);Difficulty in walking, not elsewhere classified (R26.2);Unsteadiness on feet (R26.81);Muscle weakness (generalized)  (M62.81)    Time: 9604-5409 PT Time Calculation (min) (ACUTE ONLY): 13 min   Charges:   PT Evaluation $PT Eval Moderate Complexity: 1 Mod   PT General Charges $$ ACUTE PT VISIT: 1 Visit         Elizabeth Palau, PT, DPT, [facility] [phone]   Laniesha Das 05/26/2023, 9:21 AM

## 2023-05-26 NOTE — Evaluation (Signed)
Clinical/Bedside Swallow Evaluation Patient Details  Name: Laurie Harris MRN: [id number] Date of Birth: 13-Nov-1940  Today's Date: 05/26/2023 Time: SLP Start Time (ACUTE ONLY): 1000 SLP Stop Time (ACUTE ONLY): 1010 SLP Time Calculation (min) (ACUTE ONLY): 10 min  Past Medical History:  Past Medical History:  Diagnosis Date   Carotid arterial disease (HCC)    a. 09/2016 Carotid U/S: R carotid bifurcation dzs of 50-69%, L carotid bifurcation dzs of <50%. Patent vertebral arteries w/ anegrade flow.   Cerebellar hemorrhage (HCC) 03/16/2015   Left Cerebellar Hemorrhage- See Care Everywhere Seen by [facility]     Chicken pox    CKD (chronic kidney disease), stage III (HCC)    functioning at 46%   Diabetes mellitus without complication (HCC)    Hemorrhagic stroke (HCC) 12/31/2014   a. in the setting of warfarin therapy.   History of appendicitis 08/07/2016   Hyperlipidemia    Hypertension    Syncope    a. 09/2016 Echo: EF 55-60%, no rwma, Gr1 DD.   Past Surgical History:  Past Surgical History:  Procedure Laterality Date   ABCESS DRAINAGE  06/14/2016   appendix   APPENDECTOMY  06/13/2016   CATARACT EXTRACTION W/PHACO Left 09/04/2017   Procedure: CATARACT EXTRACTION PHACO AND INTRAOCULAR LENS PLACEMENT (IOC) COMPLICATED LEFT DIABETIC;  Surgeon: Lockie Mola, MD;  Location: [facility];  Service: Ophthalmology;  Laterality: Left;  Diabetic - insulin   CATARACT EXTRACTION W/PHACO Right 10/02/2017   Procedure: CATARACT EXTRACTION PHACO AND INTRAOCULAR LENS PLACEMENT (IOC) COMPLICATED  RIGHT DIABETIC;  Surgeon: Lockie Mola, MD;  Location: [facility];  Service: Ophthalmology;  Laterality: Right;  Diabetic - insulin   CHOLECYSTECTOMY N/A 06/17/2018   Procedure: LAPAROSCOPIC CHOLECYSTECTOMY;  Surgeon: Sung Amabile, DO;  Location: [facility];  Service: General;  Laterality: N/A;   COLONOSCOPY     LAPAROSCOPIC APPENDECTOMY N/A 06/08/2022   Procedure:  APPENDECTOMY LAPAROSCOPIC;  Surgeon: Henrene Dodge, MD;  Location: [facility];  Service: General;  Laterality: N/A;   NECK SURGERY     HPI:  Pt is a 82 y.o. female who presented to ED on 9/19 with AMS. Pt with medical history significant of ICH, CKD stage IIIa, IDDM, HTN, HLD, thrombocytopenia, carotid artery stenosis.    Assessment / Plan / Recommendation  Clinical Impression  Pt seen for clinical swallowing evaluation. Prior to evaluation, spoke with OT who noted having to suction eggs from pt's oral cavity this AM and also noted pt with significant coughing with thin liquids. Pt lethargic, but roused briefly for PO trials. Oriented to self. Speech is difficult to understand due dysarthric features. Unable to follow commands for oral motor examination; however, xerostomia appreciated. Pt given trials of ice chips which were notable for oral holding and limited-to-no appreciable lingual movement in effort to transit posteriorly. SLP, ultimately, suctioned melted ice chips from oral cavity. A pharyngeal swallow could not be elicited. Recommend NPO with oral care by nursing staff. SLP to f/u per POC for clinical swallowing re-evaluation. Consideration for short term alternate route of nutrition/hydration/medication if in alignment with GOC. Palliative Care consult pending.  SLP Visit Diagnosis: Dysphagia, oropharyngeal phase (R13.12)    Aspiration Risk  Severe aspiration risk;Risk for inadequate nutrition/hydration    Diet Recommendation NPO; consideration for short term alternate route of nutrition/hydration/medication if in alignment with GOC  Medication Administration: Via alternative means    Other  Recommendations Recommended Consults:  (Palliative Care consult pending) Oral Care Recommendations: Oral care QID;Staff/trained caregiver to provide oral  care    Recommendations for follow up therapy are one component of a multi-disciplinary discharge planning process, led by the attending  physician.  Recommendations may be updated based on patient status, additional functional criteria and insurance authorization.  Follow up Recommendations Follow physician's recommendations for discharge plan and follow up therapies         Functional Status Assessment Patient has had a recent decline in their functional status and/or demonstrates limited ability to make significant improvements in function in a reasonable and predictable amount of time  Frequency and Duration min 2x/week  2 weeks       Prognosis Prognosis for improved oropharyngeal function: Guarded Barriers to Reach Goals: Cognitive deficits;Severity of deficits      Swallow Study   General Date of Onset: 05/24/23 HPI: Pt is a 82 y.o. female who presented to ED on 9/19 with AMS. Pt with medical history significant of ICH, CKD stage IIIa, IDDM, HTN, HLD, thrombocytopenia, carotid artery stenosis. Type of Study: Bedside Swallow Evaluation Previous Swallow Assessment: none Diet Prior to this Study: Dysphagia 2 (finely chopped);Thin liquids (Level 0) Temperature Spikes Noted: No History of Recent Intubation: No Behavior/Cognition: Lethargic/Drowsy;Doesn't follow directions;Confused Oral Cavity Assessment: Dry Oral Care Completed by SLP: Yes Oral Cavity - Dentition:  (difficulty visualizing) Vision:  (DNT) Self-Feeding Abilities: Total assist Patient Positioning: Upright in bed Baseline Vocal Quality: Low vocal intensity Volitional Cough: Cognitively unable to elicit Volitional Swallow: Unable to elicit    Oral/Motor/Sensory Function Overall Oral Motor/Sensory Function:  (UTA)   Ice Chips Ice chips: Impaired Oral Phase Impairments: Reduced lingual movement/coordination;Impaired mastication;Poor awareness of bolus Oral Phase Functional Implications: Oral holding;Prolonged oral transit Pharyngeal Phase Impairments:  (no pharyngeal swallow elicited after prolonged oral prep; eventually suctioned from oral cavity  via [place])   Thin Liquid Thin Liquid: Not tested    Nectar Thick Nectar Thick Liquid: Not tested   Honey Thick Honey Thick Liquid: Not tested   Puree Puree: Not tested   Solid     Solid: Not tested     Clyde Canterbury, M.S., CCC-SLP Speech-Language Pathologist [facility] [phone] Arnette Felts)  Alessandra Bevels Gleb Mcguire 05/26/2023,11:16 AM

## 2023-05-26 NOTE — Evaluation (Signed)
Occupational Therapy Evaluation Patient Details Name: Laurie Harris MRN: [id number] DOB: 1940/11/27 Today's Date: 05/26/2023   History of Present Illness Pt admitted with acute metabolic encephalopathy with complaints of AMS and multiple falls. Recent admission earlier this week. History includes ICH, CKD, DM, HTN, HLD, and CAD. Cleared by cardio for participation.   Clinical Impression   Patient presenting with decreased Ind in self care,balance, functional mobility/transfers, endurance, and safety awareness. Patient lives at [facility] and uses AD for mobility. She ambulates to dinning room and staff assist with bathing. She normally performs all toileting independent and dresses herself. Daughter present during session and pt speaks very minimally. OT observed pt with food in mouth and daughter reports she is having difficulty with thin liquids. MD notified for SLP consult and OT using suction to remove food from pt's mouth for safety. Pt able to wash face with set up A and cuing but having difficulty remaining awake during session and reports feeling very fatigued. Patient will benefit from acute OT to increase overall independence in the areas of ADLs, functional mobility, and safety awareness in order to safely discharge.       If plan is discharge home, recommend the following: Assistance with cooking/housework;Assist for transportation;A lot of help with walking and/or transfers;A lot of help with bathing/dressing/bathroom;Help with stairs or ramp for entrance    Functional Status Assessment  Patient has had a recent decline in their functional status and demonstrates the ability to make significant improvements in function in a reasonable and predictable amount of time.  Equipment Recommendations  Other (comment) (defer to next venue of care)       Precautions / Restrictions Precautions Precautions: Fall Restrictions Weight Bearing Restrictions: No      Mobility Bed  Mobility Overal bed mobility: Needs Assistance Bed Mobility: Rolling Rolling: Mod assist, Max assist              Transfers Overall transfer level: Needs assistance                 General transfer comment: unable as pt is very fatigued and recently given ativan          ADL either performed or assessed with clinical judgement   ADL Overall ADL's : Needs assistance/impaired                                       General ADL Comments: Pt needing assistance with self feeding and needing to be suctioned as food pocketing in mouth. Pt able to wash face with cloth placed in hand.     Vision Patient Visual Report: No change from baseline              Pertinent Vitals/Pain Pain Assessment Pain Assessment: Faces Faces Pain Scale: No hurt     Extremity/Trunk Assessment Upper Extremity Assessment Upper Extremity Assessment: Generalized weakness   Lower Extremity Assessment Lower Extremity Assessment: Generalized weakness   Cervical / Trunk Assessment Cervical / Trunk Assessment: Normal   Communication Communication Communication: No apparent difficulties   Cognition Arousal: Alert, Lethargic Behavior During Therapy: Flat affect Overall Cognitive Status: History of cognitive impairments - at baseline                                 General Comments: Pt follows commands with increased time and  multimodal cuing. She is very fatigued and having difficulty remaining awake during session.                Home Living Family/patient expects to be discharged to:: Assisted living                             Home Equipment: Rollator (4 wheels)   Additional Comments: Pt resident at [facility]      Prior Functioning/Environment Prior Level of Function : Independent/Modified Independent             Mobility Comments: Able to walk by herself with rollator to/from dining hall ADLs Comments: Pt showers with staff  assist but is able to dress herself and take herself to bathroom without assistance.        OT Problem List: Decreased cognition;Impaired balance (sitting and/or standing);Decreased strength;Decreased activity tolerance;Decreased safety awareness;Decreased knowledge of use of DME or AE      OT Treatment/Interventions: Self-care/ADL training;Therapeutic exercise;Therapeutic activities;Energy conservation;DME and/or AE instruction;Patient/family education;Balance training    OT Goals(Current goals can be found in the care plan section) Acute Rehab OT Goals Patient Stated Goal: to get stronger OT Goal Formulation: With patient Time For Goal Achievement: 06/09/23 Potential to Achieve Goals: Fair ADL Goals Pt Will Perform Grooming: with supervision;standing Pt Will Perform Lower Body Dressing: with supervision;sit to/from stand Pt Will Transfer to Toilet: with supervision;ambulating Pt Will Perform Toileting - Clothing Manipulation and hygiene: with supervision;sit to/from stand  OT Frequency: Min 1X/week       AM-PAC OT "6 Clicks" Daily Activity     Outcome Measure Help from another person eating meals?: None Help from another person taking care of personal grooming?: A Little Help from another person toileting, which includes using toliet, bedpan, or urinal?: A Lot Help from another person bathing (including washing, rinsing, drying)?: A Lot Help from another person to put on and taking off regular upper body clothing?: A Little Help from another person to put on and taking off regular lower body clothing?: A Lot 6 Click Score: 16   End of Session Nurse Communication: Mobility status  Activity Tolerance: Patient limited by fatigue Patient left: in bed;with call bell/phone within reach;with bed alarm set;with family/visitor present  OT Visit Diagnosis: Unsteadiness on feet (R26.81);Muscle weakness (generalized) (M62.81)                Time: 1610-9604 OT Time Calculation (min): 20  min Charges:  OT General Charges $OT Visit: 1 Visit OT Evaluation $OT Eval Moderate Complexity: 1 Mod OT Treatments $Self Care/Home Management : 8-22 mins  Jackquline Denmark, MS, OTR/L , CBIS ascom [phone]  05/26/23, 11:36 AM

## 2023-05-26 NOTE — Plan of Care (Signed)
  Problem: Coping: Goal: Ability to adjust to condition or change in health will improve Outcome: Progressing   Problem: Fluid Volume: Goal: Ability to maintain a balanced intake and output will improve Outcome: Progressing   Problem: Health Behavior/Discharge Planning: Goal: Ability to identify and utilize available resources and services will improve Outcome: Progressing   Problem: Metabolic: Goal: Ability to maintain appropriate glucose levels will improve Outcome: Progressing   Problem: Nutritional: Goal: Maintenance of adequate nutrition will improve Outcome: Progressing Goal: Progress toward achieving an optimal weight will improve Outcome: Progressing   Problem: Skin Integrity: Goal: Risk for impaired skin integrity will decrease Outcome: Progressing   Problem: Tissue Perfusion: Goal: Adequacy of tissue perfusion will improve Outcome: Progressing   Problem: Clinical Measurements: Goal: Ability to maintain clinical measurements within normal limits will improve Outcome: Progressing Goal: Will remain free from infection Outcome: Progressing Goal: Diagnostic test results will improve Outcome: Progressing Goal: Respiratory complications will improve Outcome: Progressing Goal: Cardiovascular complication will be avoided Outcome: Progressing   Problem: Activity: Goal: Risk for activity intolerance will decrease Outcome: Progressing   Problem: Nutrition: Goal: Adequate nutrition will be maintained Outcome: Progressing   Problem: Coping: Goal: Level of anxiety will decrease Outcome: Progressing   Problem: Elimination: Goal: Will not experience complications related to bowel motility Outcome: Progressing Goal: Will not experience complications related to urinary retention Outcome: Progressing   Problem: Pain Managment: Goal: General experience of comfort will improve Outcome: Progressing   Problem: Safety: Goal: Ability to remain free from injury will  improve Outcome: Progressing   Problem: Skin Integrity: Goal: Risk for impaired skin integrity will decrease Outcome: Progressing   Problem: Education: Goal: Ability to describe self-care measures that may prevent or decrease complications (Diabetes Survival Skills Education) will improve Outcome: Not Progressing Note: Patient experiencing confusion at this time. Will continue to reorient and educate as needed. Goal: Individualized Educational Video(s) Outcome: Not Progressing Note: Patient experiencing confusion at this time. Will continue to reorient and educate as needed.   Problem: Health Behavior/Discharge Planning: Goal: Ability to manage health-related needs will improve Outcome: Not Progressing Note: Patient experiencing confusion at this time. Will continue to reorient and educate as needed.   Problem: Education: Goal: Knowledge of General Education information will improve Description: Including pain rating scale, medication(s)/side effects and non-pharmacologic comfort measures Outcome: Not Progressing Note: Patient experiencing confusion at this time. Will continue to reorient and educate as needed.   Problem: Health Behavior/Discharge Planning: Goal: Ability to manage health-related needs will improve Outcome: Not Progressing Note: Patient experiencing confusion at this time. Will continue to reorient and educate as needed.

## 2023-05-26 NOTE — Assessment & Plan Note (Addendum)
Patient with waxing and waning mental status over the past few weeks, rapid cognitive decline since the death of her son.  All imaging negative including MRI brain.  EEG was done during most recent admission and it was negative for any seizure. Patient on dysphagia diet. Patient moved arms to sternal rub today

## 2023-05-26 NOTE — Consult Note (Signed)
Consultation Note Date: 05/26/2023   Patient Name: Laurie Harris  DOB: March 01, 1941  MRN: [id number]  Age / Sex: 82 y.o., female  PCP: Laurie Luis, MD Referring Physician: Arnetha Courser, MD  Reason for Consultation: Establishing goals of care   HPI/Brief Hospital Course: 82 y.o. female  with past medical history of ICH cerebellar in 2016, CKD stage 3a, IDDM, HTN, HLD, thrombocytopenia, carotid artery stenosis admitted from [facility] on 05/24/2023 with AMS and multiple falls.  Noted recent hospitalization 9/19-9/20 due to AMS-completed stroke work up including MRI of brain and EEG both negative-discharged and returned to ALF   Palliative medicine was consulted for assisting with goals of care conversations.  Subjective:  Extensive chart review has been completed prior to meeting patient including labs, vital signs, imaging, progress notes, orders, and available advanced directive documents from current and previous encounters.  Rounded in AM, per nursing staff, daughter-Laurie Harris just left and went home to sleep as she had been at bedside all night.  Rounded again and visited with Laurie Harris at her bedside. Resting in bed with eyes closed, does not acknowledge my presence in room. Daughter-Laurie Harris at bedside as well as Laurie Harris's brother.  Introduced myself as a Publishing rights manager as a member of the palliative care team. Explained palliative medicine is specialized medical care for people living with serious illness. It focuses on providing relief from the symptoms and stress of a serious illness. The goal is to improve quality of life for both the patient and the family.   Laurie Harris shares a brief life review. Laurie Harris has been in ALF since 2021 after losing her husband. At baseline, a few days prior to initial admission, Laurie Harris able to ambulate with walker, needed minimal assistance with completing ADL's. 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  Laurie Harris speaks to a sudden, drastic and ongoing cognitive decline in Laurie Harris. Since the passing of Laurie Harris's younger brother she has noticed an ongoing decline. In the last several weeks the decline has been progressive and rapid. Laurie Harris shares she has been told her mother may have a specific form of dementia that correlates to this rapid decline.  Attempted to elicit goals of care. Confirmed DNR/DNI status with Laurie Harris. We discuss continuing with current plan of care with current treatments versus shifting our focus to comfort care. Ms. Taneja would no longer receive aggressive medical interventions such as continuous vital signs, lab work, radiology testing, or medications not focused on comfort. All care would focus on how the patient is looking and feeling. This would include management of any symptoms that may cause discomfort, pain, shortness of breath, cough, nausea, agitation, anxiety, and/or secretions etc. Symptoms would be managed with medications and other non-pharmacological interventions such as spiritual support if requested, repositioning, music therapy, or therapeutic listening. Family verbalized understanding and appreciation.   Laurie Harris shares she is appointed [employer], she is the only living child and Laurie Harris is deceased.  Laurie Harris feels transitioning to full comfort measures at this time is most appropriate. We discussed disposition and overall philosophy of hospice, possibility of returning to [facility] versus LTC or possibility of anticipating hospital passing.  Laurie Harris is open to discussing disposition options further and hearing more from hospice.  I completed a MOST form today and the signed original was placed in the chart. The form was scanned and sent to medical records for it to be uploaded under ACP tab in Epic. A photocopy  was also placed in the chart to be scanned into EMR. The patient outlined their wishes for the  following treatment decisions:  Cardiopulmonary Resuscitation: Do Not Attempt Resuscitation (DNR/No CPR)  Medical Interventions: Comfort Measures: Keep clean, warm, and dry. Use medication by any route, positioning, wound care, and other measures to relieve pain and suffering. Use oxygen, suction and manual treatment of airway obstruction as needed for comfort. Do not transfer to the hospital unless comfort needs cannot be met in current location.  Antibiotics: No antibiotics (use other measures to relieve symptoms)  IV Fluids: No IV fluids (provide other measures to ensure comfort)  Feeding Tube: No feeding tube     All questions/concerns addressed. Emotional support provided to patient/family/support persons. PMT will continue to follow and support patient as needed.  Objective: Primary Diagnoses: Present on Admission:  Acute metabolic encephalopathy  Fall  Type II diabetes mellitus with renal manifestations (HCC)  Benign essential HTN  Hyperlipidemia  Stage 3a chronic kidney disease (CKD) (HCC)  Myocardial injury  SIRS (systemic inflammatory response syndrome) (HCC)  Thrombocytopenia (HCC)  Vital Signs: BP (!) 132/53   Pulse 100   Temp (!) 96.8 F (36 C) (Axillary)   Resp 18   Ht 5\' 6"  (1.676 m)   Wt 55.8 kg   SpO2 95%   BMI 19.86 kg/m  Pain Scale: 0-10 POSS *See Group Information*: 1-Acceptable,Awake and alert Pain Score: Asleep  IO: Intake/output summary:  Intake/Output Summary (Last 24 hours) at 05/26/2023 1814 Last data filed at 05/26/2023 1100 Gross per 24 hour  Intake 245.44 ml  Output --  Net 245.44 ml    LBM: Last BM Date : 05/25/23 Baseline Weight: Weight: 55.8 kg Most recent weight: Weight: 55.8 kg       Assessment and Plan  SUMMARY OF RECOMMENDATIONS   DNR/DNI/Comfort Care PRN Morphine, Lorazepam, Haldol as needed for comfort TOC to engage with Hospice in AM for disposition, await to assess for possible anticipation of hospital death  Discussed  With: Primary team and TOC   Thank you for this consult and allowing Palliative Medicine to participate in the care of Laurie M. Koroma. Palliative medicine will continue to follow and assist as needed.   Time Total: 55 minutes  Time spent includes: Detailed review of medical records (labs, imaging, vital signs), medically appropriate exam (mental status, respiratory, cardiac, skin), discussed with treatment team, counseling and educating patient, family and staff, documenting clinical information, medication management and coordination of care.   Signed by: Leeanne Deed, DNP, AGNP-C Palliative Medicine    Please contact Palliative Medicine Team phone at [phone] for questions and concerns.  For individual provider: See Loretha Stapler

## 2023-05-26 NOTE — Progress Notes (Signed)
Progress Note   Patient: Laurie Harris PPI:[id number] DOB: 06-14-1941 DOA: 05/24/2023     1 DOS: the patient was seen and examined on 05/26/2023   Brief hospital course: Taken from H&P.   Laurie Harris is a 82 y.o. female with medical history significant of ICH cerebella 2016, CKD stage IIIa, IDDM, HTN, HLD, thrombocytopenia, carotid artery stenosis, who presents with altered mental status and fall.   Patient was hospitalized from 9/19 - 9/20 due to altered mental status.  Her mental status has been declining recently.  Patient had slurred speech and unsteady gait.  She had negative MRI of brain, and EEG was with diffuse slowing and no seizure.  TSH is slightly elevated 6.15, but free T4 was normal.  Vitamin B12 level was normal.   Psychiatry evaluation, (+) grief reaction. Pt  was discharged to ALF.   Per report, pt was noted to be more confused and has had multiple falls.  Data reviewed and ED Course: pt was found to have WBC 6.9, GFR> 60, trop  60,  lactic acid 2.5, negative PCR for COVID, flu and RSV, hypothermia with temperature 94.6, blood pressure 152/83, heart rate of 110, RR 23, oxygen sat 99% on room air.  Chest x-ray negative.  CT head negative for acute intracranial abnormalities.  X-ray of pelvis negative.   EKG with sinus rhythm, QTc 499, LAD, anteroseptal infarction pattern, poor R wave progression.  Overnight due to rising troponin, she was started on heparin infusion.  Patient also met sepsis criteria due to hypothermia and tachycardia, no obvious source of infection.  She was given Lawyer and started on broad-spectrum antibiotics.  9/21: Improving hypothermia with temperature at 96 now, saturating well on room air.  Procalcitonin 0.13, lactic acidosis resolved.  Due to worsening troponin cardiology was also consulted, likely demand ischemia with hypothermia. Patient has an outpatient appointment with neurology on 06/17/2023 for her rapidly declining mental status  over the past few weeks.  Patient with a steep decline since Jun 29, 2024after the death of her son.  Echocardiogram was also ordered, cardiology is recommending conservative management for now. UDS was positive for ecstasy on 05/23/2023??  Palliative care was also consulted.  9/22: Vital stable but clinically seems worsening, started having difficulty with swallowing and appears more lethargic.  Swallow evaluation ordered.  Seems like patient is approaching end-of-life.  Palliative care to discuss with family regarding transitioning to comfort care.   Assessment and Plan: * Acute metabolic encephalopathy Patient with waxing and waning mental status over the past few weeks, rapid cognitive decline since the death of her son.  All imaging negative including MRI brain.  EEG was done during most recent admission and it was negative for any seizure. Has outpatient neurology evaluation scheduled for 06/17/2023. Worsening lethargy and now unable to swallow. Seems like approaching end-of-life. -Continue to monitor  Fall Patient with recurrent falls.  No acute fractures. -PT/OT evaluation-patient might need to go to SNF instead of ALF  SIRS (systemic inflammatory response syndrome) (HCC)  Patient meets criteria for SIRS with heart rate of 110, RR 23, hypothermic with temperature 94.6, no leukocytosis.  Lactic acid 2.5.  Urinalysis negative except for rare bacteria.  Chest x-ray negative.  No source of infection identified.  Per daughter patient has a baseline hypothermia and her temperature remained around 95 most of the time.  Preliminary blood cultures negative.  Procalcitonin barely positive at 0.13. -Stop antibiotics and monitor  Myocardial injury Troponin elevated, peaked at 3148  and now started trending down.  No chest pain or acute EKG changes.  Cardiology was consulted and she was started on heparin infusion.  Likely demand ischemia. -Cardiology is recommending continue heparin for 48  hours -Medical management as she is not a candidate for any invasive evaluation or treatment. -Restarting home metoprolol at a lower dose -Echocardiogram pending -Continue aspirin and statin  Type II diabetes mellitus with renal manifestations (HCC) CBG within goal with A1c of 7 Patient was taking 75/25 insulin 14 units twice daily. -Continue with home regimen along with sliding scale  Benign essential HTN Blood pressure within goal. -Started on low-dose metoprolol -Keep holding benazepril-can add as needed  Thrombocytopenia (HCC) Patient with chronic thrombocytopenia, platelets at 108. -Continue to monitor  Hyperlipidemia -Continue statin  Stage 3a chronic kidney disease (CKD) (HCC) Stable renal function. -Monitor renal function -Avoid nephrotoxins   Subjective: Patient appears very lethargic when seen today.  Having significant dysarthria.  Denies any pain  Physical Exam: Vitals:   05/26/23 0104 05/26/23 0435 05/26/23 0758 05/26/23 1205  BP: 132/63 (!) 144/62 126/68 (!) 128/50  Pulse: (!) 102 (!) 107 (!) 103 71  Resp: 18 20 17 15   Temp: 98.2 F (36.8 C) 97.6 F (36.4 C) (!) 97.1 F (36.2 C) (!) 97.5 F (36.4 C)  TempSrc:  Oral Axillary Axillary  SpO2: 99% 99% 97% 97%   General.  Frail and lethargic elderly lady, in no acute distress. Pulmonary.  Lungs clear bilaterally, normal respiratory effort. CV.  Regular rate and rhythm, no JVD, rub or murmur. Abdomen.  Soft, nontender, nondistended, BS positive. CNS.  Lethargic, no apparent focal deficit Extremities.  No edema, no cyanosis, pulses intact and symmetrical.   Data Reviewed: Prior data reviewed  Family Communication: Discussed with daughter on phone.  Disposition: Status is: Inpatient Remains inpatient appropriate because: Severity of illness  Planned Discharge Destination: Skilled nursing facility  Time spent: 50  minutes  This record has been created using Conservation officer, historic buildings. Errors  have been sought and corrected,but may not always be located. Such creation errors do not reflect on the standard of care.   Author: Arnetha Courser, MD 05/26/2023 12:58 PM  For on call review [email or web address].

## 2023-05-26 NOTE — Assessment & Plan Note (Addendum)
Patient meets criteria for SIRS with heart rate of 110, RR 23, hypothermic with temperature 94.6, no leukocytosis.  Lactic acid 2.5.  Made comfort care.  Did not have a sepsis source so sepsis ruled out.

## 2023-05-26 NOTE — Progress Notes (Signed)
Cardiology Progress Note  Patient ID: Laurie Harris MRN: [id number] DOB: [birth year] Date of Encounter: 05/26/2023  Primary Cardiologist: Lorine Bears, MD  Subjective   Chief Complaint: Confusion   HPI: Confused overnight.  More lethargic.  No chest pain.  ROS:  All other ROS reviewed and negative. Pertinent positives noted in the HPI.     Inpatient Medications  Scheduled Meds:  aspirin EC  81 mg Oral Daily   fenofibrate  160 mg Oral Daily   insulin aspart  0-5 Units Subcutaneous QHS   insulin aspart  0-9 Units Subcutaneous TID WC   insulin aspart protamine- aspart  10 Units Subcutaneous BID WC   metoprolol tartrate  25 mg Oral BID   pantoprazole  40 mg Oral QODAY   QUEtiapine  25 mg Oral QHS   rosuvastatin  40 mg Oral Daily   Continuous Infusions:  sodium chloride 75 mL/hr at 05/25/23 0310   heparin 500 Units/hr (05/26/23 1203)   PRN Meds: acetaminophen, acetaminophen, hydrALAZINE, LORazepam, ondansetron (ZOFRAN) IV   Vital Signs   Vitals:   05/26/23 0104 05/26/23 0435 05/26/23 0758 05/26/23 1205  BP: 132/63 (!) 144/62 126/68 (!) 128/50  Pulse: (!) 102 (!) 107 (!) 103 71  Resp: 18 20 17 15   Temp: 98.2 F (36.8 C) 97.6 F (36.4 C) (!) 97.1 F (36.2 C) (!) 97.5 F (36.4 C)  TempSrc:  Oral Axillary Axillary  SpO2: 99% 99% 97% 97%    Intake/Output Summary (Last 24 hours) at 05/26/2023 1305 Last data filed at 05/26/2023 1100 Gross per 24 hour  Intake 245.44 ml  Output --  Net 245.44 ml      05/23/2023    9:04 AM 05/16/2023   11:23 AM 05/10/2023   10:50 AM  Last 3 Weights  Weight (lbs) 123 lb 123 lb 121 lb 12.8 oz  Weight (kg) 55.792 kg 55.792 kg 55.248 kg      Telemetry  Overnight telemetry shows sinus rhythm 70s, which I personally reviewed.   ECG  The most recent ECG shows sinus tachycardia heart rate 101, no acute ischemic changes, which I personally reviewed.   Physical Exam   Vitals:   05/26/23 0104 05/26/23 0435 05/26/23 0758 05/26/23 1205   BP: 132/63 (!) 144/62 126/68 (!) 128/50  Pulse: (!) 102 (!) 107 (!) 103 71  Resp: 18 20 17 15   Temp: 98.2 F (36.8 C) 97.6 F (36.4 C) (!) 97.1 F (36.2 C) (!) 97.5 F (36.4 C)  TempSrc:  Oral Axillary Axillary  SpO2: 99% 99% 97% 97%    Intake/Output Summary (Last 24 hours) at 05/26/2023 1305 Last data filed at 05/26/2023 1100 Gross per 24 hour  Intake 245.44 ml  Output --  Net 245.44 ml       05/23/2023    9:04 AM 05/16/2023   11:23 AM 05/10/2023   10:50 AM  Last 3 Weights  Weight (lbs) 123 lb 123 lb 121 lb 12.8 oz  Weight (kg) 55.792 kg 55.792 kg 55.248 kg    There is no height or weight on file to calculate BMI.  General: Ill-appearing, follows commands, drowsy Head: Atraumatic, normal size  Eyes: PEERLA, EOMI  Neck: Supple, no JVD Endocrine: No thryomegaly Cardiac: Normal S1, S2; RRR; 2 out of 6 systolic ejection murmur Lungs: Diminished breath sounds Abd: Soft, nontender, no hepatomegaly  Ext: No edema, pulses 2+ Musculoskeletal: No deformities Skin: Warm and dry, no rashes   Neuro: Drowsy, awake, follows commands  Labs  High Sensitivity Troponin:  Recent Labs  Lab 05/25/23 0258 05/25/23 0442 05/25/23 0801 05/25/23 0956 05/25/23 1143  TROPONINIHS 138* 1,176* 3,148* 3,014* 2,495*     Cardiac EnzymesNo results for input(s): "TROPONINI" in the last 168 hours. No results for input(s): "TROPIPOC" in the last 168 hours.  Chemistry Recent Labs  Lab 05/23/23 0901 05/24/23 2327 05/25/23 0258  NA 142 143 143  K 3.6 3.6 3.6  CL 110 106 108  CO2 23 21* 24  GLUCOSE 179* 164* 100*  BUN 31* 30* 30*  CREATININE 1.01* 0.89 0.82  CALCIUM 9.5 10.1 9.7  PROT 7.1 7.0  --   ALBUMIN 3.4* 3.7  --   AST 53* 57*  --   ALT 48* 49*  --   ALKPHOS 135* 128*  --   BILITOT 0.9 1.1  --   GFRNONAA 55* >60 >60  ANIONGAP 9 16* 11    Hematology Recent Labs  Lab 05/23/23 0901 05/24/23 2327 05/25/23 0258  WBC 4.5 6.9 8.1  RBC 3.39* 3.64* 3.49*  HGB 12.3 12.5 11.6*  HCT  34.7* 37.0 33.6*  MCV 102.4* 101.6* 96.3  MCH 36.3* 34.3* 33.2  MCHC 35.4 33.8 34.5  RDW 15.2 14.9 14.9  PLT 107* 108* 101*   BNP Recent Labs  Lab 05/24/23 2327  BNP 117.2*    DDimer No results for input(s): "DDIMER" in the last 168 hours.   Radiology  CT Head Wo Contrast  Result Date: 05/25/2023 CLINICAL DATA:  Mental status change, unknown cause. Seen here 05/23/2023 for left-sided stroke symptoms but had a negative brain MRI. EXAM: CT HEAD WITHOUT CONTRAST TECHNIQUE: Contiguous axial images were obtained from the base of the skull through the vertex without intravenous contrast. RADIATION DOSE REDUCTION: This exam was performed according to the departmental dose-optimization program which includes automated exposure control, adjustment of the mA and/or kV according to patient size and/or use of iterative reconstruction technique. COMPARISON:  Head CT and MRI brain both 05/23/2023, most recent was the MRI 1:00 p.m. FINDINGS: Brain: There is mild to moderate global atrophy with atrophic ventriculomegaly and mild small vessel disease of the cerebral white matter with chronic mineralization in the basal ganglia. No new asymmetry is seen concerning for a cortical based acute infarct, hemorrhage, mass or mass effect. There is no midline shift. The basal cisterns are clear. Vascular: Both carotid siphons and distal vertebral arteries are heavily calcified. No hyperdense central vessel is seen. Skull: Negative for fractures or focal lesions, but there is a new finding of a small right lateral frontal scalp contusion, not seen previously. Sinuses/Orbits: Clear sinuses and mastoids. Reverse S shaped nasal septum with right-sided spurring. Old lens extractions with otherwise negative orbits. Other: None. IMPRESSION: 1. No acute intracranial CT findings or interval changes. 2. Atrophy with small-vessel disease. 3. Small right lateral frontal scalp contusion, was not seen on 05/23/2023. There is no depressed  skull fracture. Electronically Signed   By: Almira Bar M.D.   On: 05/25/2023 00:57   DG Chest Port 1 View  Result Date: 05/25/2023 CLINICAL DATA:  Altered mental status with multiple fall EXAM: PORTABLE CHEST 1 VIEW COMPARISON:  09/25/2022 FINDINGS: The heart size and mediastinal contours are within normal limits. Both lungs are clear. The visualized skeletal structures are unremarkable. IMPRESSION: No active disease. Electronically Signed   By: Jasmine Pang M.D.   On: 05/25/2023 00:15   DG Pelvis Portable  Result Date: 05/25/2023 CLINICAL DATA:  Altered mental status multiple fall EXAM: PORTABLE PELVIS 1-2 VIEWS COMPARISON:  CT 06/18/2022 FINDINGS: SI joints are non widened. Pubic symphysis and rami appear intact. Bilateral hip degenerative change. No fracture or malalignment IMPRESSION: No acute osseous abnormality. Electronically Signed   By: Jasmine Pang M.D.   On: 05/25/2023 00:14   EEG adult  Result Date: 05/24/2023 Charlsie Quest, MD     05/24/2023  3:19 PM Patient Name: Laurie Harris MRN: [id number] Epilepsy Attending: Charlsie Quest Referring Physician/Provider: Emeline General, MD Date: 05/24/2023 Duration: 30.23 mins Patient history: 82 year old female with altered mental status.  EEG to alert for seizure. Level of alertness: Awake, asleep AEDs during EEG study: None Technical aspects: This EEG study was done with scalp electrodes positioned according to the 10-20 International system of electrode placement. Electrical activity was reviewed with band pass filter of 1-70Hz , sensitivity of 7 uV/mm, display speed of 76mm/sec with a 60Hz  notched filter applied as appropriate. EEG data were recorded continuously and digitally stored.  Video monitoring was available and reviewed as appropriate. Description: The posterior dominant rhythm consists of 8-9 Hz activity of moderate voltage (25-35 uV) seen predominantly in posterior head regions, symmetric and reactive to eye opening and eye  closing. Sleep was characterized by vertex waves, sleep spindles (12 to 14 Hz), maximal frontocentral region. EEG showed intermittent generalized 3 to 6 Hz theta-delta slowing. Hyperventilation and photic stimulation were not performed.   ABNORMALITY - Intermittent slow, generalized IMPRESSION: This study is suggestive of mild diffuse encephalopathy. No seizures or epileptiform discharges were seen throughout the recording. Charlsie Quest    Cardiac Studies  Echo pending  Patient Profile  82 year old female with recurrent syncope secondary to dehydration, dementia, hemorrhagic stroke with residual left-sided weakness, moderate bilateral carotid artery disease, hypertension, type 2 diabetes who was admitted from assisted living facility with mechanical fall. Cardiology consult for elevated troponin.   Assessment & Plan   # Demand ischemia versus non-STEMI -Admitted after mechanical fall.  Had tachycardia and hypothermia as well as altered mental status on admission.  She never had chest pain.  EKG is nonischemic.  Troponins were increased but are trending down. -She currently lives in assisted living and is suffer from frequent falls.  She reports no chest pain and medical management is the best option. -She seems to have declined further overnight.  Palliative care is following.  From a cardiovascular standpoint all of her medications could be stopped.  I will defer this to decision making between family and primary team.  For now we have plans to manage this medically. -Continue aspirin and statin and heparin drip.  Plan for heparin drip 48 hours.  Echo is pending. -Continue metoprolol tartrate 25 mg twice daily. -I attempted to call her daughter but she did not answer.  If the patient transitions to comfort care would recommend to stop all cardiac medications.  For now we will continue with above plan.  # Fall # Lethargy # Dementia # Frailty # Altered mental status -Per primary team.   Conservative approach to her cardiovascular care as detailed above.      For questions or updates, please contact [facility] Please consult [email or web address] for contact info under        Signed, Gerri Spore T. Flora Lipps, MD, [facility]   [facility]  05/26/2023 1:05 PM

## 2023-05-27 ENCOUNTER — Telehealth: Payer: Self-pay | Admitting: Family Medicine

## 2023-05-27 ENCOUNTER — Telehealth: Payer: Self-pay | Admitting: Oncology

## 2023-05-27 DIAGNOSIS — G9341 Metabolic encephalopathy: Secondary | ICD-10-CM | POA: Diagnosis not present

## 2023-05-27 DIAGNOSIS — R6510 Systemic inflammatory response syndrome (SIRS) of non-infectious origin without acute organ dysfunction: Secondary | ICD-10-CM | POA: Diagnosis not present

## 2023-05-27 DIAGNOSIS — Z515 Encounter for palliative care: Secondary | ICD-10-CM | POA: Diagnosis not present

## 2023-05-27 DIAGNOSIS — I2489 Other forms of acute ischemic heart disease: Secondary | ICD-10-CM | POA: Diagnosis not present

## 2023-05-27 NOTE — Telephone Encounter (Signed)
Noted. I have reviewed her chart and am aware of what has happened over the weekend.

## 2023-05-27 NOTE — Progress Notes (Signed)
Progress Note   Patient: Laurie Harris NWG:[id number] DOB: 27-May-1941 DOA: 05/24/2023     2 DOS: the patient was seen and examined on 05/27/2023   Brief hospital course: Taken from H&P.   Laurie Harris is a 82 y.o. female with medical history significant of ICH cerebella 2016, CKD stage IIIa, IDDM, HTN, HLD, thrombocytopenia, carotid artery stenosis, who presents with altered mental status and fall.   Patient was hospitalized from 9/19 - 9/20 due to altered mental status.  Her mental status has been declining recently.  Patient had slurred speech and unsteady gait.  She had negative MRI of brain, and EEG was with diffuse slowing and no seizure.  TSH is slightly elevated 6.15, but free T4 was normal.  Vitamin B12 level was normal.   Psychiatry evaluation, (+) grief reaction. Pt  was discharged to ALF.   Per report, pt was noted to be more confused and has had multiple falls.  Data reviewed and ED Course: pt was found to have WBC 6.9, GFR> 60, trop  60,  lactic acid 2.5, negative PCR for COVID, flu and RSV, hypothermia with temperature 94.6, blood pressure 152/83, heart rate of 110, RR 23, oxygen sat 99% on room air.  Chest x-ray negative.  CT head negative for acute intracranial abnormalities.  X-ray of pelvis negative.   EKG with sinus rhythm, QTc 499, LAD, anteroseptal infarction pattern, poor R wave progression.  Overnight due to rising troponin, she was started on heparin infusion.  Patient also met sepsis criteria due to hypothermia and tachycardia, no obvious source of infection.  She was given Lawyer and started on broad-spectrum antibiotics.  9/21: Improving hypothermia with temperature at 96 now, saturating well on room air.  Procalcitonin 0.13, lactic acidosis resolved.  Due to worsening troponin cardiology was also consulted, likely demand ischemia with hypothermia. Patient has an outpatient appointment with neurology on 06/17/2023 for her rapidly declining mental status  over the past few weeks.  Patient with a steep decline since 2024/06/21after the death of her son.  Echocardiogram was also ordered, cardiology is recommending conservative management for now. UDS was positive for ecstasy on 05/23/2023??  Palliative care was also consulted.  9/22: Vital stable but clinically seems worsening, started having difficulty with swallowing and appears more lethargic.  Swallow evaluation ordered.  Seems like patient is approaching end-of-life.  Palliative care to discuss with family regarding transitioning to comfort care.  9/23: Patient was transition to full comfort care by family.  She does not qualify for inpatient hospice services at this time.  Family to decide about proceeding with hospice at her facility or take her back home with hospice.   Assessment and Plan: * Acute metabolic encephalopathy Patient with waxing and waning mental status over the past few weeks, rapid cognitive decline since the death of her son.  All imaging negative including MRI brain.  EEG was done during most recent admission and it was negative for any seizure. Has outpatient neurology evaluation scheduled for 06/17/2023. Worsening lethargy and now unable to swallow. Seems like approaching end-of-life. -Family decided to withdraw medical treatment and transitioning her to full comfort care -Not meeting criteria for inpatient hospice services -Family to decide whether going back to [name] with hospice or they want to take her back home with hospice -Continue to monitor  Fall Patient with recurrent falls.  No acute fractures. -PT/OT evaluation-patient might need to go to SNF instead of ALF  SIRS (systemic inflammatory response syndrome) (  HCC)  Patient meets criteria for SIRS with heart rate of 110, RR 23, hypothermic with temperature 94.6, no leukocytosis.  Lactic acid 2.5.  Urinalysis negative except for rare bacteria.  Chest x-ray negative.  No source of infection identified.   Per daughter patient has a baseline hypothermia and her temperature remained around 95 most of the time.  Preliminary blood cultures negative.  Procalcitonin barely positive at 0.13. -Stop antibiotics and monitor  Myocardial injury Troponin elevated, peaked at 3148 and now started trending down.  No chest pain or acute EKG changes.  Cardiology was consulted and she was started on heparin infusion.  Likely demand ischemia. -Cardiology is recommending continue heparin for 48 hours -Medical management as she is not a candidate for any invasive evaluation or treatment. -Restarting home metoprolol at a lower dose -Echocardiogram was initially ordered but now canceled as patient is being transitioned to full comfort care  Type II diabetes mellitus with renal manifestations (HCC) CBG within goal with A1c of 7 Patient was taking 75/25 insulin 14 units twice daily. -Continue with home regimen along with sliding scale  Benign essential HTN Blood pressure within goal. -Started on low-dose metoprolol -Keep holding benazepril-can add as needed  Thrombocytopenia (HCC) Patient with chronic thrombocytopenia, platelets at 108. -Continue to monitor  Hyperlipidemia -Continue statin  Stage 3a chronic kidney disease (CKD) (HCC) Stable renal function. -Monitor renal function -Avoid nephrotoxins   Subjective: Patient was little more alert and able to speak couple of words but continued to have significant dysarthria.  Physical Exam: Vitals:   05/26/23 1552 05/26/23 2135 05/27/23 0749 05/27/23 1440  BP: (!) 132/53  (!) 105/37 (!) 138/51  Pulse: 100  86 82  Resp: 18 12 16 16   Temp: (!) 96.8 F (36 C)   97.6 F (36.4 C)  TempSrc: Axillary   Oral  SpO2: 95%  100% 100%  Weight:      Height:       General.  Chronically ill-appearing, frail elderly lady, in no acute distress. Pulmonary.  Lungs clear bilaterally, normal respiratory effort. CV.  Regular rate and rhythm, no JVD, rub or  murmur. Abdomen.  Soft, nontender, nondistended, BS positive. CNS.  Alert and oriented to self.  No focal neurologic deficit. Extremities.  No edema, no cyanosis, pulses intact and symmetrical.  Data Reviewed: Prior data reviewed  Family Communication: Discussed with daughter on phone.  Disposition: Status is: Inpatient Remains inpatient appropriate because: Severity of illness  Planned Discharge Destination: Skilled nursing facility  Time spent: 45  minutes  This record has been created using Conservation officer, historic buildings. Errors have been sought and corrected,but may not always be located. Such creation errors do not reflect on the standard of care.   Author: Arnetha Courser, MD 05/27/2023 3:36 PM  For on call review [email or web address].

## 2023-05-27 NOTE — Plan of Care (Signed)
  Problem: Coping: Goal: Ability to adjust to condition or change in health will improve Outcome: Progressing   Problem: Skin Integrity: Goal: Risk for impaired skin integrity will decrease Outcome: Progressing   Problem: Clinical Measurements: Goal: Ability to maintain clinical measurements within normal limits will improve Outcome: Progressing

## 2023-05-27 NOTE — Telephone Encounter (Signed)
Pt daughter called wanting to let the provider know that the pt is in comfort care

## 2023-05-27 NOTE — TOC Progression Note (Signed)
Transition of Care [facility]) - Progression Note    Patient Details  Name: Laurie Harris MRN: [id number] Date of Birth: [birth year]  Transition of Care [facility]) CM/SW Contact  Truddie Hidden, RN Phone Number: 05/27/2023, 10:40 AM  Clinical Narrative:    Spoke with patient's daughter regarding request for hospice per palliative nurse. Patient's daughter is requesting [facility] IPU. Referral made to Renea Ee from [facility].         Expected Discharge Plan and Services                                               Social Determinants of Health (SDOH) Interventions SDOH Screenings   Food Insecurity: No Food Insecurity (05/25/2023)  Housing: Low Risk  (05/25/2023)  Transportation Needs: No Transportation Needs (05/25/2023)  Utilities: Not At Risk (05/25/2023)  Depression (PHQ2-9): High Risk (05/10/2023)  Financial Resource Strain: Low Risk  (05/09/2023)  Physical Activity: Inactive (05/09/2023)  Social Connections: Moderately Integrated (05/09/2023)  Stress: Patient Declined (05/09/2023)  Tobacco Use: Low Risk  (05/25/2023)    Readmission Risk Interventions     No data to display

## 2023-05-27 NOTE — Assessment & Plan Note (Signed)
Troponin elevated, peaked at 3148 and now started trending down.  No chest pain or acute EKG changes.  Cardiology was consulted and she was started on heparin infusion.  Likely demand ischemia. -Cardiology is recommending continue heparin for 48 hours -Medical management as she is not a candidate for any invasive evaluation or treatment. -Restarting home metoprolol at a lower dose -Echocardiogram was initially ordered but now canceled as patient is being transitioned to full comfort care

## 2023-05-27 NOTE — Telephone Encounter (Signed)
Pt daughter called to cancel mychart visit on 10/7/ appt has been canceled.  Pt daughter stated pt was in hospital and she has rapidly declined and is being transitioned to comfort care.

## 2023-05-27 NOTE — Progress Notes (Addendum)
Civil engineer, contracting Hospice Referral Note  Received request from Princess Perna, SW, Transitions of Care Manager, for hospice inpatient unit [facility]) services . Spoke with Clayborne Artist, daughter, via phone- to initiate education related to hospice philosophy, services, and team approach to care. Patient/family verbalized understanding of information given. Patient does not meet criteria for IPU at this time.  Patient found sitting up in bed feeding herself lunch.  She is pleasant and carries on some conversation.  Discussed with [name], we would be able to provide services at [facility] or another facility or at a personal residence.  [facility] information and contact numbers given to [employer].  Above information shared with Princess Perna, SW and medical care team at [facility].  We are happy to re-assess for [facility] appropriateness if patient's medical condition changes.  Hospital Liaison Team will follow peripherally through final discharge disposition.   Please call with any hospice related questions or concerns.  Thank you for the opportunity to participate in this patient's care.  Norris Cross, RN Nurse Liaison [phone]

## 2023-05-27 NOTE — Progress Notes (Signed)
Palliative Care Progress Note, Assessment & Plan   Patient Name: Laurie Harris       Date: 05/27/2023 DOB: [birth year]  Age: 82 y.o. MRN#: [id number] Attending Physician: Arnetha Courser, MD Primary Care Physician: Glori Luis, MD Admit Date: 05/24/2023  Subjective: Patient is lying in bed in no apparent distress.  She acknowledges my presence and is able to make her wishes known.  Her voice is weak but she is alert and oriented x 3.  Her niece is at bedside.  HPI: 82 y.o. female  with past medical history of ICH cerebellar in 2016, CKD stage 3a, IDDM, HTN, HLD, thrombocytopenia, carotid artery stenosis admitted from [facility] on 05/24/2023 with AMS and multiple falls.   Noted recent hospitalization 9/19-9/20 due to AMS-completed stroke work up including MRI of brain and EEG both negative-discharged and returned to ALF    Palliative medicine was consulted for assisting with goals of care conversations.  Summary of counseling/coordination of care: Extensive chart review completed prior to meeting patient including labs, vital signs, imaging, progress notes, orders, and available advanced directive documents from current and previous encounters.   After reviewing the patient's chart and assessing the patient at bedside, I spoke with patient in regards to symptom management and boundaries of care.  Symptoms assessed.  Patient endorses that she gets this shooting pain every now and then.  Discussed baseline coverage of pain control with use of Tylenol.  Patient Dors that she would be fine with taking it but does not feel like she needs anything stronger.  Discussed that medications remain available as needed that specifically address her comfort.  Patient endorsed understanding. Conveyed disucssion with  dayshift RN.   Discussed plan of care with patient and niece at bedside.  Plan remains for patient to be evaluated for hospice inpatient placement.  Hospice review pending.  Full comfort measures continue.  PMT will continue to follow and support patient throughout her hospitalization.  Physical Exam Vitals reviewed.  Constitutional:      General: She is not in acute distress. HENT:     Mouth/Throat:     Mouth: Mucous membranes are dry.  Eyes:     Pupils: Pupils are equal, round, and reactive to light.  Abdominal:     Palpations: Abdomen is soft.  Skin:    General: Skin is warm and dry.     Coloration: Skin is pale.  Neurological:     Mental Status: She is alert and oriented to person, place, and time.  Psychiatric:        Behavior: Behavior normal.        Thought Content: Thought content normal.        Judgment: Judgment normal.             Total Time 25 minutes   Time spent includes: Detailed review of medical records (labs, imaging, vital signs), medically appropriate exam (mental status, respiratory, cardiac, skin), discussed with treatment team, counseling and educating patient, family and staff, documenting clinical information, medication management and coordination of care.  Samara Deist L. Bonita Quin, DNP, FNP-BC Palliative Medicine Team

## 2023-05-28 DIAGNOSIS — I2489 Other forms of acute ischemic heart disease: Secondary | ICD-10-CM | POA: Diagnosis not present

## 2023-05-28 DIAGNOSIS — G9340 Encephalopathy, unspecified: Secondary | ICD-10-CM

## 2023-05-28 DIAGNOSIS — R4182 Altered mental status, unspecified: Secondary | ICD-10-CM | POA: Diagnosis not present

## 2023-05-28 DIAGNOSIS — G9341 Metabolic encephalopathy: Secondary | ICD-10-CM | POA: Diagnosis not present

## 2023-05-28 DIAGNOSIS — R296 Repeated falls: Secondary | ICD-10-CM | POA: Diagnosis not present

## 2023-05-28 NOTE — Progress Notes (Signed)
[facility] Liaison Team will follow peripherally through final discharge disposition.   Please don't hesitate to call with any Hospice related questions or concerns.    Thank you for the opportunity to participate in this patient's care. [facility] Liaison [phone]

## 2023-05-28 NOTE — Progress Notes (Signed)
Progress Note   Patient: Laurie Harris WUJ:[id number] DOB: 1941/03/05 DOA: 05/24/2023     3 DOS: the patient was seen and examined on 05/28/2023   Brief hospital course: Taken from H&P.   Laurie Harris is a 82 y.o. female with medical history significant of ICH cerebella 2016, CKD stage IIIa, IDDM, HTN, HLD, thrombocytopenia, carotid artery stenosis, who presents with altered mental status and fall.   Patient was hospitalized from 9/19 - 9/20 due to altered mental status.  Her mental status has been declining recently.  Patient had slurred speech and unsteady gait.  She had negative MRI of brain, and EEG was with diffuse slowing and no seizure.  TSH is slightly elevated 6.15, but free T4 was normal.  Vitamin B12 level was normal.   Psychiatry evaluation, (+) grief reaction. Pt  was discharged to ALF.   Per report, pt was noted to be more confused and has had multiple falls.  Data reviewed and ED Course: pt was found to have WBC 6.9, GFR> 60, trop  60,  lactic acid 2.5, negative PCR for COVID, flu and RSV, hypothermia with temperature 94.6, blood pressure 152/83, heart rate of 110, RR 23, oxygen sat 99% on room air.  Chest x-ray negative.  CT head negative for acute intracranial abnormalities.  X-ray of pelvis negative.   EKG with sinus rhythm, QTc 499, LAD, anteroseptal infarction pattern, poor R wave progression.  Overnight due to rising troponin, she was started on heparin infusion.  Patient also met sepsis criteria due to hypothermia and tachycardia, no obvious source of infection.  She was given Lawyer and started on broad-spectrum antibiotics.  9/21: Improving hypothermia with temperature at 96 now, saturating well on room air.  Procalcitonin 0.13, lactic acidosis resolved.  Due to worsening troponin cardiology was also consulted, likely demand ischemia with hypothermia. Patient has an outpatient appointment with neurology on 06/17/2023 for her rapidly declining mental status  over the past few weeks.  Patient with a steep decline since 07-20-24after the death of her son.  Echocardiogram was also ordered, cardiology is recommending conservative management for now. UDS was positive for ecstasy on 05/23/2023??  Palliative care was also consulted.  9/22: Vital stable but clinically seems worsening, started having difficulty with swallowing and appears more lethargic.  Swallow evaluation ordered.  Seems like patient is approaching end-of-life.  Palliative care to discuss with family regarding transitioning to comfort care.  9/23: Patient was transition to full comfort care by family.  She does not qualify for inpatient hospice services at this time.  Family to decide about proceeding with hospice at her facility or take her back home with hospice.  9/24: Currently medically stable with waxing and waning mental status.  Still unable to eat regular food.  Unfortunately her prior assisted living facility cannot take her back with hospice.  She does not qualify going to an hospice facility, [facility] and daughter is trying to explore other options.   Assessment and Plan: * Acute metabolic encephalopathy Patient with waxing and waning mental status over the past few weeks, rapid cognitive decline since the death of her son.  All imaging negative including MRI brain.  EEG was done during most recent admission and it was negative for any seizure. Has outpatient neurology evaluation scheduled for 06/17/2023. Worsening lethargy and now unable to swallow. Seems like approaching end-of-life. -Family decided to withdraw medical treatment and transitioning her to full comfort care -Not meeting criteria for inpatient hospice services -Family  to decide whether going back to [facility] with hospice or they want to take her back home with hospice -Continue to monitor  Fall Patient with recurrent falls.  No acute fractures. -PT/OT evaluation-patient might need to go to SNF instead of  ALF  SIRS (systemic inflammatory response syndrome) (HCC)  Patient meets criteria for SIRS with heart rate of 110, RR 23, hypothermic with temperature 94.6, no leukocytosis.  Lactic acid 2.5.  Urinalysis negative except for rare bacteria.  Chest x-ray negative.  No source of infection identified.  Per daughter patient has a baseline hypothermia and her temperature remained around 95 most of the time.  Preliminary blood cultures negative.  Procalcitonin barely positive at 0.13. -Stop antibiotics and monitor  Myocardial injury Troponin elevated, peaked at 3148 and now started trending down.  No chest pain or acute EKG changes.  Cardiology was consulted and she was started on heparin infusion.  Likely demand ischemia. -Cardiology is recommending continue heparin for 48 hours -Medical management as she is not a candidate for any invasive evaluation or treatment. -Restarting home metoprolol at a lower dose -Echocardiogram was initially ordered but now canceled as patient is being transitioned to full comfort care  Type II diabetes mellitus with renal manifestations (HCC) CBG within goal with A1c of 7 Patient was taking 75/25 insulin 14 units twice daily. -Continue with home regimen along with sliding scale  Benign essential HTN Blood pressure within goal. -Started on low-dose metoprolol -Keep holding benazepril-can add as needed  Thrombocytopenia (HCC) Patient with chronic thrombocytopenia, platelets at 108. -Continue to monitor  Hyperlipidemia -Continue statin  Stage 3a chronic kidney disease (CKD) (HCC) Stable renal function. -Monitor renal function -Avoid nephrotoxins   Subjective: Patient was seen soon after getting bath by nursing staff.  She was little perked up but still unable to eat much.  Physical Exam: Vitals:   05/26/23 2135 05/27/23 0749 05/27/23 1440 05/28/23 0502  BP:  (!) 105/37 (!) 138/51 (!) 163/63  Pulse:  86 82 95  Resp: 12 16 16    Temp:   97.6 F (36.4 C)    TempSrc:   Oral   SpO2:  100% 100% 97%  Weight:      Height:       General.  Frail elderly lady, in no acute distress. Pulmonary.  Lungs clear bilaterally, normal respiratory effort. CV.  Regular rate and rhythm, no JVD, rub or murmur. Abdomen.  Soft, nontender, nondistended, BS positive. CNS.  Alert and oriented .  No focal neurologic deficit. Extremities.  No edema, no cyanosis, pulses intact and symmetrical.  Data Reviewed: Prior data reviewed  Family Communication: Discussed with daughter on phone.  Disposition: Status is: Inpatient Remains inpatient appropriate because: Severity of illness  Planned Discharge Destination: Skilled nursing facility  Time spent: 44  minutes  This record has been created using Conservation officer, historic buildings. Errors have been sought and corrected,but may not always be located. Such creation errors do not reflect on the standard of care.   Author: Arnetha Courser, MD 05/28/2023 2:24 PM  For on call review [email or web address].

## 2023-05-28 NOTE — Care Management Important Message (Signed)
Important Message  Patient Details  Name: Laurie Harris MRN: [id number] Date of Birth: February 02, 1941   Important Message Given:  N/A - LOS <3 / Initial given by admissions     Olegario Messier A Kishaun Erekson 05/28/2023, 8:59 AM

## 2023-05-28 NOTE — Progress Notes (Signed)
Palliative Care Progress Note, Assessment & Plan   Patient Name: Laurie Harris       Date: 05/28/2023 DOB: 05/17/1941  Age: 82 y.o. MRN#: [id number] Attending Physician: Arnetha Courser, MD Primary Care Physician: Glori Luis, MD Admit Date: 05/24/2023  Subjective: Patient is lying in bed with a towel wrapped around her head like a turban.  She acknowledges my presence.  She speaks about needing to get a key for her grandson to open an account.  Her friend is at bedside, endorsing that patient does not have a grandson.  Patient is pleasantly confused and in no apparent distress during my visit.  HPI: 82 y.o. female  with past medical history of ICH cerebellar in 2016, CKD stage 3a, IDDM, HTN, HLD, thrombocytopenia, carotid artery stenosis admitted from [facility] on 05/24/2023 with AMS and multiple falls.   Noted recent hospitalization 9/19-9/20 due to AMS-completed stroke work up including MRI of brain and EEG both negative-discharged and returned to ALF    Palliative medicine was consulted for assisting with goals of care conversations  Summary of counseling/coordination of care: Extensive chart review completed prior to meeting patient including labs, vital signs, imaging, progress notes, orders, and available advanced directive documents from current and previous encounters.   After reviewing the patient's chart and assessing the patient at bedside, I spoke with patient and friend in regards to symptom management and boundaries of care.  Symptoms assessed.  Patient denies pain or discomfort at this time.  She denies constipation, nausea, vomiting, headache, or chest pain.  No acute distress noted.  No adjustment to [facility] needed at this time.  Discussed with patient that plan is to transfer her out  of the hospital but to continue to focus on keeping her as comfortable as possible.  She endorsed understanding but then began to speak about needing to get change so that she could get to the bank.  TOC following closely for discharge planning.  PMT will continue to follow and support patient throughout her hospitalization.  Full comfort measures continue.  Physical Exam Vitals reviewed.  Constitutional:      General: She is not in acute distress. HENT:     Head: Normocephalic.     Mouth/Throat:     Mouth: Mucous membranes are moist.  Eyes:     Pupils: Pupils are equal, round, and reactive to light.  Pulmonary:     Effort: Pulmonary effort is normal.  Abdominal:     Palpations: Abdomen is soft.  Skin:    General: Skin is warm and dry.     Coloration: Skin is pale.  Neurological:     Mental Status: She is alert.  Psychiatric:        Behavior: Behavior normal.             Total Time 25 minutes   Time spent includes: Detailed review of medical records (labs, imaging, vital signs), medically appropriate exam (mental status, respiratory, cardiac, skin), discussed with treatment team, counseling and educating patient, family and staff, documenting clinical information, medication management and coordination of care.  Samara Deist L. Bonita Quin, DNP, FNP-BC Palliative Medicine Team

## 2023-05-28 NOTE — TOC Progression Note (Addendum)
Transition of Care [facility]) - Progression Note    Patient Details  Name: Laurie Harris MRN: [id number] Date of Birth: December 20, 1940  Transition of Care [facility]) CM/SW Contact  Allena Katz, LCSW Phone Number: 05/28/2023, 9:51 AM  Clinical Narrative:   CSW spoke with linda at [facility] who reports pt is unable to return there with care. TOC to discuss alternative steps with daughter.   9:55am  Pt now having issues swallowing and eating. Message sent to Ree Kida with [facility] to re-evaluate.        Expected Discharge Plan and Services                                               Social Determinants of Health (SDOH) Interventions SDOH Screenings   Food Insecurity: No Food Insecurity (05/25/2023)  Housing: Low Risk  (05/25/2023)  Transportation Needs: No Transportation Needs (05/25/2023)  Utilities: Not At Risk (05/25/2023)  Depression (PHQ2-9): High Risk (05/10/2023)  Financial Resource Strain: Low Risk  (05/09/2023)  Physical Activity: Inactive (05/09/2023)  Social Connections: Moderately Integrated (05/09/2023)  Stress: Patient Declined (05/09/2023)  Tobacco Use: Low Risk  (05/25/2023)    Readmission Risk Interventions     No data to display

## 2023-05-28 NOTE — [place] FL2 (Signed)
[place] MEDICA[place] FL2 LEVEL OF CARE FORM     IDENTIFICATION  Patient Name: Laurie Harris Birthdate: 1940/10/13 Sex: female Admission Date (Current Location): 05/24/2023  [facility] and [place] Number:  Chiropodist and Address:         Provider Number: [id number]  Attending Physician Name and Address:  Arnetha Courser, MD  Relative Name and Phone Number:       Current Level of Care:  Hospital Recommended Level of Care: Skilled Nursing Facility Prior Approval Number:    Date Approved/Denied:   PASRR Number:    Discharge Plan:      Current Diagnoses: Patient Active Problem List   Diagnosis Date Noted   Demand ischemia 05/26/2023   Acute metabolic encephalopathy 05/25/2023   Fall 05/25/2023   Myocardial injury 05/25/2023   SIRS (systemic inflammatory response syndrome) (HCC) 05/25/2023   Thrombocytopenia (HCC) 05/25/2023   Hypothermia 05/25/2023   AMS (altered mental status) 05/23/2023   Hallucination 05/12/2023   Slurred speech 05/03/2023   Hospital discharge follow-up 07/20/2022   Appendicitis 06/09/2022   Acute abdominal pain 06/08/2022   Acute appendicitis 06/08/2022   Stage 3a chronic kidney disease (CKD) (HCC) 06/08/2022   Acute perforated appendicitis 06/08/2022   Bright red blood per rectum 12/19/2021   Bruising 12/19/2021   Recurrent falls 12/19/2021   Thoracic back pain 11/03/2021   Diabetic retinopathy (HCC) 09/29/2021   Depression, major, single episode, mild (HCC) 07/05/2020   GERD (gastroesophageal reflux disease) 11/11/2019   Memory difficulty 10/15/2019   Grief 07/10/2019   Hirsutism 10/29/2018   Constipation 07/29/2018   Osteoporosis 12/26/2017   Aorto-iliac atherosclerosis (HCC) 09/20/2017   Macrocytosis without anemia 09/20/2017   Carotid artery disease (HCC) 06/19/2016   Acute renal failure superimposed on stage 3a chronic kidney disease (HCC) 05/10/2016   Type II diabetes mellitus with renal manifestations (HCC) 03/16/2015    Benign essential HTN 03/16/2015   Hyperlipidemia 03/16/2015   Carotid artery occlusion without infarction 08/18/2013    Orientation RESPIRATION BLADDER Height & Weight     Self, Place    Incontinent Weight: 123 lb 0.3 oz (55.8 kg) Height:  5\' 6"  (167.6 cm)  BEHAVIORAL SYMPTOMS/MOOD NEUROLOGICAL BOWEL NUTRITION STATUS         (DYS 1)  AMBULATORY STATUS COMMUNICATION OF NEEDS Skin        (skin tear pretibial left)                       Personal Care Assistance Level of Assistance              Functional Limitations Info             SPECIAL CARE FACTORS FREQUENCY                       Contractures      Additional Factors Info  Code Status, Allergies Code Status Info: DNR-comfort             Current Medications (05/28/2023):  This is the current hospital active medication list Current Facility-Administered Medications  Medication Dose Route Frequency Provider Last Rate Last Admin   acetaminophen (TYLENOL) tablet 650 mg  650 mg Oral Q6H PRN Theotis Burrow, NP       Or   acetaminophen (TYLENOL) suppository 650 mg  650 mg Rectal Q6H PRN Theotis Burrow, NP       antiseptic oral rinse (BIOTENE) solution 15 mL  15 mL Topical  PRN Theotis Burrow, NP       glycopyrrolate (ROBINUL) tablet 1 mg  1 mg Oral Q4H PRN Theotis Burrow, NP       Or   glycopyrrolate (ROBINUL) injection 0.2 mg  0.2 mg Subcutaneous Q4H PRN Theotis Burrow, NP       Or   glycopyrrolate (ROBINUL) injection 0.2 mg  0.2 mg Intravenous Q4H PRN Theotis Burrow, NP       haloperidol (HALDOL) tablet 0.5 mg  0.5 mg Oral Q4H PRN Theotis Burrow, NP       Or   haloperidol (HALDOL) 2 MG/ML solution 0.5 mg  0.5 mg Sublingual Q4H PRN Theotis Burrow, NP       Or   haloperidol lactate (HALDOL) injection 0.5 mg  0.5 mg Intravenous Q4H PRN Theotis Burrow, NP   0.5 mg at 05/28/23 0249   LORazepam (ATIVAN) tablet 1 mg  1 mg Oral Q4H PRN Theotis Burrow, NP       Or   LORazepam (ATIVAN) 2  MG/ML concentrated solution 1 mg  1 mg Sublingual Q4H PRN Theotis Burrow, NP       Or   LORazepam (ATIVAN) injection 1 mg  1 mg Intravenous Q4H PRN Theotis Burrow, NP   1 mg at 05/28/23 1139   morphine (PF) 2 MG/ML injection 1 mg  1 mg Intravenous Q2H PRN Theotis Burrow, NP       ondansetron (ZOFRAN-ODT) disintegrating tablet 4 mg  4 mg Oral Q6H PRN Theotis Burrow, NP       Or   ondansetron (ZOFRAN) injection 4 mg  4 mg Intravenous Q6H PRN Theotis Burrow, NP         Discharge Medications: Please see discharge summary for a list of discharge medications.  Relevant Imaging Results:  Relevant Lab Results:   Additional Information    Community education officer, LCSW

## 2023-05-28 NOTE — TOC Progression Note (Signed)
Transition of Care [facility]) - Progression Note    Patient Details  Name: Laurie Harris MRN: [id number] Date of Birth: [birth year]  Transition of Care [facility]) CM/SW Contact  Allena Katz, LCSW Phone Number: 05/28/2023, 2:36 PM  Clinical Narrative:   CSW spoke with patients daughter who wants to go [facility]. [facility]iff[facility] at [facility].         Expected Discharge Plan and Services                                               Social Determinants of Health (SDOH) Interventions SDOH Screenings   Food Insecurity: No Food Insecurity (05/25/2023)  Housing: Low Risk  (05/25/2023)  Transportation Needs: No Transportation Needs (05/25/2023)  Utilities: Not At Risk (05/25/2023)  Depression (PHQ2-9): High Risk (05/10/2023)  Financial Resource Strain: Low Risk  (05/09/2023)  Physical Activity: Inactive (05/09/2023)  Social Connections: Moderately Integrated (05/09/2023)  Stress: Patient Declined (05/09/2023)  Tobacco Use: Low Risk  (05/25/2023)    Readmission Risk Interventions     No data to display

## 2023-05-29 DIAGNOSIS — R296 Repeated falls: Secondary | ICD-10-CM | POA: Diagnosis not present

## 2023-05-29 DIAGNOSIS — E1122 Type 2 diabetes mellitus with diabetic chronic kidney disease: Secondary | ICD-10-CM

## 2023-05-29 DIAGNOSIS — R4182 Altered mental status, unspecified: Secondary | ICD-10-CM | POA: Diagnosis not present

## 2023-05-29 DIAGNOSIS — G9341 Metabolic encephalopathy: Secondary | ICD-10-CM | POA: Diagnosis not present

## 2023-05-29 DIAGNOSIS — I214 Non-ST elevation (NSTEMI) myocardial infarction: Secondary | ICD-10-CM

## 2023-05-29 DIAGNOSIS — G9340 Encephalopathy, unspecified: Secondary | ICD-10-CM | POA: Diagnosis not present

## 2023-05-29 DIAGNOSIS — R6510 Systemic inflammatory response syndrome (SIRS) of non-infectious origin without acute organ dysfunction: Secondary | ICD-10-CM | POA: Diagnosis not present

## 2023-05-29 DIAGNOSIS — E785 Hyperlipidemia, unspecified: Secondary | ICD-10-CM

## 2023-05-29 LAB — CULTURE, BLOOD (ROUTINE X 2)
Culture: NO GROWTH
Special Requests: ADEQUATE

## 2023-05-29 NOTE — Care Management Important Message (Signed)
Important Message  Patient Details  Name: Laurie Harris MRN: [id number] Date of Birth: 13-Feb-1941   Important Message Given:  Other (see comment)  Patient is on comfort care and out of respect for the patient and family no Important Message from [facility] given today.   Olegario Messier A Haylyn Halberg 05/29/2023, 2:09 PM

## 2023-05-29 NOTE — Assessment & Plan Note (Signed)
Patient with recurrent falls.  Multiple bruises.

## 2023-05-29 NOTE — Progress Notes (Signed)
Progress Note   Patient: Laurie Harris WJX:[id number] DOB: [birth year] DOA: 05/24/2023     4 DOS: the patient was seen and examined on 05/29/2023   Brief hospital course: Taken from H&P.   Laurie Harris is a 82 y.o. female with medical history significant of ICH cerebella 2016, CKD stage IIIa, IDDM, HTN, HLD, thrombocytopenia, carotid artery stenosis, who presents with altered mental status and fall.   Patient was hospitalized from 9/19 - 9/20 due to altered mental status.  Her mental status has been declining recently.  Patient had slurred speech and unsteady gait.  She had negative MRI of brain, and EEG was with diffuse slowing and no seizure.  TSH is slightly elevated 6.15, but free T4 was normal.  Vitamin B12 level was normal.   Psychiatry evaluation, (+) grief reaction. Pt  was discharged to ALF.   Per report, pt was noted to be more confused and has had multiple falls.  Data reviewed and ED Course: pt was found to have WBC 6.9, GFR> 60, trop  60,  lactic acid 2.5, negative PCR for COVID, flu and RSV, hypothermia with temperature 94.6, blood pressure 152/83, heart rate of 110, RR 23, oxygen sat 99% on room air.  Chest x-ray negative.  CT head negative for acute intracranial abnormalities.  X-ray of pelvis negative.   EKG with sinus rhythm, QTc 499, LAD, anteroseptal infarction pattern, poor R wave progression.  Overnight due to rising troponin, she was started on heparin infusion.  Patient also met sepsis criteria due to hypothermia and tachycardia, no obvious source of infection.  She was given Lawyer and started on broad-spectrum antibiotics.  9/21: Improving hypothermia with temperature at 96 now, saturating well on room air.  Procalcitonin 0.13, lactic acidosis resolved.  Due to worsening troponin cardiology was also consulted, likely demand ischemia with hypothermia. Patient has an outpatient appointment with neurology on 06/17/2023 for her rapidly declining mental status  over the past few weeks.  Patient with a steep decline since 2024-06-30after the death of her son.  Echocardiogram was also ordered, cardiology is recommending conservative management for now. UDS was positive for ecstasy on 05/23/2023??  Palliative care was also consulted.  9/22: Vital stable but clinically seems worsening, started having difficulty with swallowing and appears more lethargic.  Swallow evaluation ordered.  Seems like patient is approaching end-of-life.  Palliative care to discuss with family regarding transitioning to comfort care.  9/23: Patient was transition to full comfort care by family.  She does not qualify for inpatient hospice services at this time.  Family to decide about proceeding with hospice at her facility or take her back home with hospice.  9/24: Currently medically stable with waxing and waning mental status.  Still unable to eat regular food.  Unfortunately her prior assisted living facility cannot take her back with hospice.  She does not qualify going to an hospice facility, [facility] and daughter is trying to explore other options. 9/25.  Patient needed help eating.   Assessment and Plan: * Acute metabolic encephalopathy Patient with waxing and waning mental status over the past few weeks, rapid cognitive decline since the death of her son.  All imaging negative including MRI brain.  EEG was done during most recent admission and it was negative for any seizure. Patient on dysphagia diet. Made comfort care measures.  NSTEMI (non-ST elevated myocardial infarction) (HCC) Troponin peaked at 3148.  Made comfort care.  SIRS (systemic inflammatory response syndrome) [facility]) Patient meets criteria  for SIRS with heart rate of 110, RR 23, hypothermic with temperature 94.6, no leukocytosis.  Lactic acid 2.5.  Made comfort care.  Type II diabetes mellitus with renal manifestations (HCC) CBG within goal with A1c of 7 Medications discontinued  Benign essential  HTN Medications discontinued  Thrombocytopenia (HCC) Chronic in nature  Hyperlipidemia Medications discontinued  Stage 3a chronic kidney disease (CKD) (HCC) Last creatinine 0.82.  Fall Patient with recurrent falls.  Multiple bruises.        Subjective: Patient answers a few questions.  I gave her a few bites to eat but could not maneuver the spoon to feed herself.  Asked nursing staff to feed her.  Now currently comfort care.  Physical Exam: Vitals:   05/27/23 0749 05/27/23 1440 05/28/23 0502 05/29/23 0803  BP: (!) 105/37 (!) 138/51 (!) 163/63 (!) 183/85  Pulse: 86 82 95 82  Resp: 16 16  20   Temp:  97.6 F (36.4 C)  (!) 97.2 F (36.2 C)  TempSrc:  Oral    SpO2: 100% 100% 97% 100%  Weight:      Height:       Physical Exam HENT:     Head: Normocephalic.     Mouth/Throat:     Pharynx: No oropharyngeal exudate.  Eyes:     General: Lids are normal.     Conjunctiva/sclera: Conjunctivae normal.  Cardiovascular:     Rate and Rhythm: Normal rate and regular rhythm.     Heart sounds: Normal heart sounds, S1 normal and S2 normal.  Pulmonary:     Breath sounds: Examination of the right-lower field reveals decreased breath sounds. Examination of the left-lower field reveals decreased breath sounds. Decreased breath sounds present. No wheezing, rhonchi or rales.  Abdominal:     Palpations: Abdomen is soft.     Tenderness: There is no abdominal tenderness.  Musculoskeletal:     Right lower leg: No swelling.     Left lower leg: No swelling.  Skin:    General: Skin is warm.     Comments: Bruising bilateral arms and legs and right side of the head.  Neurological:     Mental Status: She is alert and oriented to person, place, and time.     Data Reviewed: No further lab draws since comfort care  Family Communication: Updated patient's daughter on the phone  Disposition: Status is: Inpatient Remains inpatient appropriate because: TOC looking into long-term care options  with palliative following.  Planned Discharge Destination: Long-term care    Time spent: 27 minutes  Author: Alford Highland, MD 05/29/2023 2:05 PM  For on call review [email or web address].

## 2023-05-29 NOTE — Assessment & Plan Note (Signed)
Troponin peaked at 3148.  Made comfort care.

## 2023-05-29 NOTE — Progress Notes (Signed)
Palliative Care Progress Note, Assessment & Plan   Patient Name: Laurie Harris       Date: 05/29/2023 DOB: [birth year]  Age: 82 y.o. MRN#: [id number] Attending Physician: Alford Highland, MD Primary Care Physician: Glori Luis, MD Admit Date: 05/24/2023  Subjective: Patient is lying in bed.  Her gaze is towards the ceiling.  She does not shift or track her eyes with my presence.  Respirations are even and unlabored.  No signs of distress noted.  She does not acknowledge my presence or make her wishes known.  No family or friends present during my visit.  HPI: 82 y.o. female  with past medical history of ICH cerebellar in 2016, CKD stage 3a, IDDM, HTN, HLD, thrombocytopenia, carotid artery stenosis admitted from [facility] on 05/24/2023 with AMS and multiple falls.   Noted recent hospitalization 9/19-9/20 due to AMS-completed stroke work up including MRI of brain and EEG both negative-discharged and returned to ALF    Palliative medicine was consulted for assisting with goals of care conversations  Summary of counseling/coordination of care: Extensive chart review completed prior to meeting patient including labs, vital signs, imaging, progress notes, orders, and available advanced directive documents from current and previous encounters.   After reviewing the patient's chart, I assessed the patient at bedside.  No signs of distress noted.  No nonverbal signs of pain noted such as brow furrowing, groaning, grimacing, or fidgeting.  Patient appears to be comfortable but is not interactive.  Full comfort measures continue.  No adjustment to [facility] needed at this time.  TOC following closely for discharge planning.  PMT will continue to follow and support patient throughout her hospitalization.     Physical Exam Vitals reviewed.  Constitutional:      General: She is not in acute distress. HENT:     Head: Normocephalic.     Mouth/Throat:     Mouth: Mucous membranes are moist.  Pulmonary:     Effort: Pulmonary effort is normal.  Abdominal:     Palpations: Abdomen is soft.  Musculoskeletal:     Comments: Generalized weakness  Skin:    General: Skin is warm and dry.     Coloration: Skin is pale.  Neurological:     Mental Status: She is alert.             Total Time 25 minutes   Time spent includes: Detailed review of medical records (labs, imaging, vital signs), medically appropriate exam (mental status, respiratory, cardiac, skin), discussed with treatment team, counseling and educating patient, family and staff, documenting clinical information, medication management and coordination of care.  Laurie Deist L. Bonita Quin, DNP, FNP-BC Palliative Medicine Team

## 2023-05-29 NOTE — Progress Notes (Signed)
[facility] Liaison Team will follow peripherally through final discharge disposition.   Please don't hesitate to call with any Hospice related questions or concerns.    Thank you for the opportunity to participate in this patient's care. [facility] Liaison [phone]

## 2023-05-29 NOTE — Assessment & Plan Note (Signed)
Medications discontinued

## 2023-05-29 NOTE — Assessment & Plan Note (Signed)
Chronic in nature.

## 2023-05-29 NOTE — Assessment & Plan Note (Signed)
Last creatinine 0.82.

## 2023-05-29 NOTE — Assessment & Plan Note (Signed)
Medications discontinued

## 2023-05-29 NOTE — Assessment & Plan Note (Addendum)
Medications discontinued since comfort care.

## 2023-05-30 DIAGNOSIS — Z515 Encounter for palliative care: Secondary | ICD-10-CM

## 2023-05-30 DIAGNOSIS — R6510 Systemic inflammatory response syndrome (SIRS) of non-infectious origin without acute organ dysfunction: Secondary | ICD-10-CM | POA: Diagnosis not present

## 2023-05-30 DIAGNOSIS — G9340 Encephalopathy, unspecified: Secondary | ICD-10-CM | POA: Diagnosis not present

## 2023-05-30 DIAGNOSIS — I214 Non-ST elevation (NSTEMI) myocardial infarction: Secondary | ICD-10-CM | POA: Diagnosis not present

## 2023-05-30 DIAGNOSIS — R296 Repeated falls: Secondary | ICD-10-CM | POA: Diagnosis not present

## 2023-05-30 DIAGNOSIS — G9341 Metabolic encephalopathy: Secondary | ICD-10-CM | POA: Diagnosis not present

## 2023-05-30 LAB — CULTURE, BLOOD (ROUTINE X 2): Culture: NO GROWTH

## 2023-05-30 LAB — INTELLIGEN MYELOID

## 2023-05-30 NOTE — Progress Notes (Signed)
Progress Note   Patient: Laurie Harris:[id number] DOB: 1941-04-03 DOA: 05/24/2023     5 DOS: the patient was seen and examined on 05/30/2023   Brief hospital course: Taken from H&P.   Laurie Harris is a 82 y.o. female with medical history significant of ICH cerebella 2016, CKD stage IIIa, IDDM, HTN, HLD, thrombocytopenia, carotid artery stenosis, who presents with altered mental status and fall.   Patient was hospitalized from 9/19 - 9/20 due to altered mental status.  Her mental status has been declining recently.  Patient had slurred speech and unsteady gait.  She had negative MRI of brain, and EEG was with diffuse slowing and no seizure.  TSH is slightly elevated 6.15, but free T4 was normal.  Vitamin B12 level was normal.   Psychiatry evaluation, (+) grief reaction. Pt  was discharged to ALF.   Per report, pt was noted to be more confused and has had multiple falls.  Data reviewed and ED Course: pt was found to have WBC 6.9, GFR> 60, trop  60,  lactic acid 2.5, negative PCR for COVID, flu and RSV, hypothermia with temperature 94.6, blood pressure 152/83, heart rate of 110, RR 23, oxygen sat 99% on room air.  Chest x-ray negative.  CT head negative for acute intracranial abnormalities.  X-ray of pelvis negative.   EKG with sinus rhythm, QTc 499, LAD, anteroseptal infarction pattern, poor R wave progression.  Overnight due to rising troponin, she was started on heparin infusion.  Patient also met sepsis criteria due to hypothermia and tachycardia, no obvious source of infection.  She was given Lawyer and started on broad-spectrum antibiotics.  9/21: Improving hypothermia with temperature at 96 now, saturating well on room air.  Procalcitonin 0.13, lactic acidosis resolved.  Due to worsening troponin cardiology was also consulted, likely demand ischemia with hypothermia. Patient has an outpatient appointment with neurology on 06/17/2023 for her rapidly declining mental status  over the past few weeks.  Patient with a steep decline since 2024-07-06after the death of her son.  Echocardiogram was also ordered, cardiology is recommending conservative management for now. UDS was positive for ecstasy on 05/23/2023??  Palliative care was also consulted.  9/22: Vital stable but clinically seems worsening, started having difficulty with swallowing and appears more lethargic.  Swallow evaluation ordered.  Seems like patient is approaching end-of-life.  Palliative care to discuss with family regarding transitioning to comfort care.  9/23: Patient was transition to full comfort care by family.  She does not qualify for inpatient hospice services at this time.  Family to decide about proceeding with hospice at her facility or take her back home with hospice.  9/24: Currently medically stable with waxing and waning mental status.  Still unable to eat regular food.  Unfortunately her prior assisted living facility cannot take her back with hospice.  She does not qualify going to an hospice facility, [facility] and daughter is trying to explore other options. 9/25.  Patient needed help eating. 9/26.  Patient less talkative today.  Has her mouth open.  Daughter states last night that she was seeing people that already passed away.  Assessment and Plan: * End of life care Patient over the last 2 days has declined.  Asked for hospice reevaluation.  They currently do not have a bed.  Patient on comfort care measures.  Acute metabolic encephalopathy Patient with waxing and waning mental status over the past few weeks, rapid cognitive decline since the death of her  son.  All imaging negative including MRI brain.  EEG was done during most recent admission and it was negative for any seizure. Patient on dysphagia diet and not eating much. Made comfort care measures.  NSTEMI (non-ST elevated myocardial infarction) (HCC) Troponin peaked at 3148.  Made comfort care.  SIRS (systemic inflammatory  response syndrome) (HCC) Patient meets criteria for SIRS with heart rate of 110, RR 23, hypothermic with temperature 94.6, no leukocytosis.  Lactic acid 2.5.  Made comfort care.  Type II diabetes mellitus with renal manifestations (HCC) Medications discontinued since comfort care.  Benign essential HTN Medications discontinued  Thrombocytopenia (HCC) Chronic in nature  Hyperlipidemia Medications discontinued  Stage 3a chronic kidney disease (CKD) (HCC) Last creatinine 0.82.  Fall Patient with recurrent falls.  Multiple bruises.        Subjective: Patient less talkative today.  Patient not eating much.  Did not want any bites of food when I saw her.  On comfort care measures.  Physical Exam: Vitals:   05/27/23 1440 05/28/23 0502 05/29/23 0803 05/30/23 0736  BP: (!) 138/51 (!) 163/63 (!) 183/85 (!) 154/66  Pulse: 82 95 82 88  Resp: 16  20 15   Temp: 97.6 F (36.4 C)  (!) 97.2 F (36.2 C) (!) 97 F (36.1 C)  TempSrc: Oral     SpO2: 100% 97% 100% 100%  Weight:      Height:       Physical Exam HENT:     Head: Normocephalic.     Mouth/Throat:     Pharynx: No oropharyngeal exudate.  Eyes:     General: Lids are normal.     Conjunctiva/sclera: Conjunctivae normal.  Cardiovascular:     Rate and Rhythm: Normal rate and regular rhythm.     Heart sounds: Normal heart sounds, S1 normal and S2 normal.  Pulmonary:     Breath sounds: Examination of the right-lower field reveals decreased breath sounds. Examination of the left-lower field reveals decreased breath sounds. Decreased breath sounds present. No wheezing, rhonchi or rales.  Abdominal:     Palpations: Abdomen is soft.     Tenderness: There is no abdominal tenderness.  Musculoskeletal:     Right lower leg: No swelling.     Left lower leg: No swelling.  Skin:    General: Skin is warm.     Comments: Bruising bilateral arms and legs and right side of the head.  Neurological:     Mental Status: She is alert and  oriented to person, place, and time.     Data Reviewed: No new data  Family Communication: Updated patient's daughter on the phone  Disposition: Status is: Inpatient Remains inpatient appropriate because: We asked for hospice reevaluation today since patient is declining.  Currently no beds at hospice home  Planned Discharge Destination: Hopefully hospice home    Time spent: 28 minutes  Author: Alford Highland, MD 05/30/2023 2:27 PM  For on call review [email or web address].

## 2023-05-30 NOTE — Plan of Care (Signed)

## 2023-05-30 NOTE — Progress Notes (Signed)
Palliative Care Progress Note, Assessment & Plan   Patient Name: Laurie Harris       Date: 05/30/2023 DOB: 01/04/1941  Age: 82 y.o. MRN#: [id number] Attending Physician: Alford Highland, MD Primary Care Physician: Glori Luis, MD Admit Date: 05/24/2023  Subjective: Patient is lying in bed.  She acknowledges my presence but does not make clear vocalizations.  She can answer yes or no appropriately.  Dr. Hilton Sinclair and I visited patient jointly but no family or friends present during my visit.  HPI: 82 y.o. female  with past medical history of ICH cerebellar in 2016, CKD stage 3a, IDDM, HTN, HLD, thrombocytopenia, carotid artery stenosis admitted from [facility] on 05/24/2023 with AMS and multiple falls.   Noted recent hospitalization 9/19-9/20 due to AMS-completed stroke work up including MRI of brain and EEG both negative-discharged and returned to ALF    Palliative medicine was consulted for assisting with goals of care conversations.  Summary of counseling/coordination of care: Extensive chart review completed prior to meeting patient including labs, vital signs, imaging, progress notes, orders, and available advanced directive documents from current and previous encounters.   After reviewing the patient's chart and assessing the patient at bedside, I spoke with patient and attending in regards to symptom management and goals of care.  Symptoms assessed.  Patient complained that she would needed to be moved.  Patient readjusted in bed and endorsed significant improvement in discomfort.  Denies pain at this time.  No adjustment to [facility] needed.  Discussed with attending that patient appears to have declined over the last few days.  Since last hospice inpatient evaluation, I have noticed a shift in  patient's overall functional, nutritional, and cognitive status.  She is less interactive.  She has drooping of the nasolabial folds, she appears more pale, and has had a decrease in p.o. intake.  After meeting with the patient, attending and I in agreement for hospice to hopefully reevaluate for IPU placement.  Liaison notified and hospice reevaluation pending.  PMT will remain available and continue to support patient throughout her hospitalization.  Physical Exam Vitals reviewed.  Constitutional:      General: She is not in acute distress. HENT:     Head: Normocephalic.     Comments: Bruising of right side of head    Mouth/Throat:     Mouth: Mucous membranes are moist.  Pulmonary:     Effort: Pulmonary effort is normal.  Abdominal:     Palpations: Abdomen is soft.  Musculoskeletal:     Comments: Generalized weakness  Skin:    General: Skin is warm and dry.     Coloration: Skin is pale.  Neurological:     Mental Status: She is alert.  Psychiatric:        Behavior: Behavior normal.        Judgment: Judgment normal.             Total Time 25 minutes   Time spent includes: Detailed review of medical records (labs, imaging, vital signs), medically appropriate exam (mental status, respiratory, cardiac, skin), discussed with treatment team, counseling and educating patient, family and staff, documenting clinical information, medication management and coordination of care.  Samara Deist  Jones Skene, DNP, FNP-BC Palliative Medicine Team

## 2023-05-30 NOTE — TOC Progression Note (Signed)
Transition of Care [facility]) - Progression Note    Patient Details  Name: Laurie Harris MRN: [id number] Date of Birth: 1941/08/18  Transition of Care [facility]) CM/SW Contact  Allena Katz, LCSW Phone Number: 05/30/2023, 9:55 AM  Clinical Narrative:   CSW LVM with tiffanie regarding this patient going to [facility].         Expected Discharge Plan and Services                                               Social Determinants of Health (SDOH) Interventions SDOH Screenings   Food Insecurity: No Food Insecurity (05/25/2023)  Housing: Low Risk  (05/25/2023)  Transportation Needs: No Transportation Needs (05/25/2023)  Utilities: Not At Risk (05/25/2023)  Depression (PHQ2-9): High Risk (05/10/2023)  Financial Resource Strain: Low Risk  (05/09/2023)  Physical Activity: Inactive (05/09/2023)  Social Connections: Moderately Integrated (05/09/2023)  Stress: Patient Declined (05/09/2023)  Tobacco Use: Low Risk  (05/25/2023)    Readmission Risk Interventions     No data to display

## 2023-05-30 NOTE — TOC Progression Note (Signed)
Transition of Care [facility]) - Progression Note    Patient Details  Name: Laurie Harris MRN: [id number] Date of Birth: [birth year]  [facility] CM/SW Contact  Allena Katz, LCSW Phone Number: 05/30/2023, 3:33 PM  Clinical Narrative:     [facility] hospice house to evaluate patient. TOC following.        Expected Discharge Plan and Services                                               Social Determinants of Health (SDOH) Interventions SDOH Screenings   Food Insecurity: No Food Insecurity (05/25/2023)  Housing: Low Risk  (05/25/2023)  Transportation Needs: No Transportation Needs (05/25/2023)  Utilities: Not At Risk (05/25/2023)  Depression (PHQ2-9): High Risk (05/10/2023)  Financial Resource Strain: Low Risk  (05/09/2023)  Physical Activity: Inactive (05/09/2023)  Social Connections: Moderately Integrated (05/09/2023)  Stress: Patient Declined (05/09/2023)  Tobacco Use: Low Risk  (05/25/2023)    Readmission Risk Interventions     No data to display

## 2023-05-30 NOTE — Assessment & Plan Note (Addendum)
Patient over the last 3 days has declined.  Currently no beds at hospice home.  Continue comfort care measures.

## 2023-05-30 NOTE — Progress Notes (Signed)
[facility]- [employer], [employer]  Received a message from Palliative care provider to re-evaluate patient for the Hospice home due to changes in her condition. Hospice physician is currently evaluating patient, but there is no  bed availability at the Hospice Home today.  Will re-evaluate tomorrow.    Please don't hesitate to call with any Hospice related questions or concerns.    Thank you for the opportunity to participate in this patient's care. [facility] Liaison [phone]

## 2023-05-31 ENCOUNTER — Ambulatory Visit: Payer: Medicare Other | Admitting: Family Medicine

## 2023-05-31 DIAGNOSIS — G9340 Encephalopathy, unspecified: Secondary | ICD-10-CM | POA: Diagnosis not present

## 2023-05-31 DIAGNOSIS — R6510 Systemic inflammatory response syndrome (SIRS) of non-infectious origin without acute organ dysfunction: Secondary | ICD-10-CM | POA: Diagnosis not present

## 2023-05-31 DIAGNOSIS — R296 Repeated falls: Secondary | ICD-10-CM | POA: Diagnosis not present

## 2023-05-31 DIAGNOSIS — I214 Non-ST elevation (NSTEMI) myocardial infarction: Secondary | ICD-10-CM | POA: Diagnosis not present

## 2023-05-31 DIAGNOSIS — Z515 Encounter for palliative care: Secondary | ICD-10-CM | POA: Diagnosis not present

## 2023-05-31 DIAGNOSIS — G9341 Metabolic encephalopathy: Secondary | ICD-10-CM | POA: Diagnosis not present

## 2023-05-31 NOTE — Plan of Care (Signed)
Pt exhibited signs of agitation and pain this shift. Restless in bed, pulled out IV and was turning sideways in bed. Reaching for things in the air and grunting. Ativan, Haldol and morphine given PRN throughout shift. New IV placed. Pt now sleeping peacefully. Bed locked and in lowest position. Call bell in reach. Bed alarm on.    Problem: Education: Goal: Ability to describe self-care measures that may prevent or decrease complications (Diabetes Survival Skills Education) will improve Outcome: Progressing Goal: Individualized Educational Video(s) Outcome: Progressing   Problem: Coping: Goal: Ability to adjust to condition or change in health will improve Outcome: Progressing   Problem: Fluid Volume: Goal: Ability to maintain a balanced intake and output will improve Outcome: Progressing   Problem: Health Behavior/Discharge Planning: Goal: Ability to identify and utilize available resources and services will improve Outcome: Progressing Goal: Ability to manage health-related needs will improve Outcome: Progressing   Problem: Metabolic: Goal: Ability to maintain appropriate glucose levels will improve Outcome: Progressing   Problem: Nutritional: Goal: Maintenance of adequate nutrition will improve Outcome: Progressing Goal: Progress toward achieving an optimal weight will improve Outcome: Progressing   Problem: Skin Integrity: Goal: Risk for impaired skin integrity will decrease Outcome: Progressing   Problem: Tissue Perfusion: Goal: Adequacy of tissue perfusion will improve Outcome: Progressing   Problem: Education: Goal: Knowledge of General Education information will improve Description: Including pain rating scale, medication(s)/side effects and non-pharmacologic comfort measures Outcome: Progressing   Problem: Health Behavior/Discharge Planning: Goal: Ability to manage health-related needs will improve Outcome: Progressing   Problem: Clinical  Measurements: Goal: Ability to maintain clinical measurements within normal limits will improve Outcome: Progressing Goal: Will remain free from infection Outcome: Progressing Goal: Diagnostic test results will improve Outcome: Progressing Goal: Respiratory complications will improve Outcome: Progressing Goal: Cardiovascular complication will be avoided Outcome: Progressing   Problem: Activity: Goal: Risk for activity intolerance will decrease Outcome: Progressing   Problem: Nutrition: Goal: Adequate nutrition will be maintained Outcome: Progressing   Problem: Coping: Goal: Level of anxiety will decrease Outcome: Progressing   Problem: Elimination: Goal: Will not experience complications related to bowel motility Outcome: Progressing Goal: Will not experience complications related to urinary retention Outcome: Progressing   Problem: Pain Managment: Goal: General experience of comfort will improve Outcome: Progressing   Problem: Safety: Goal: Ability to remain free from injury will improve Outcome: Progressing   Problem: Skin Integrity: Goal: Risk for impaired skin integrity will decrease Outcome: Progressing   Problem: Education: Goal: Knowledge of the prescribed therapeutic regimen will improve Outcome: Progressing   Problem: Coping: Goal: Ability to identify and develop effective coping behavior will improve Outcome: Progressing   Problem: Clinical Measurements: Goal: Quality of life will improve Outcome: Progressing   Problem: Respiratory: Goal: Verbalizations of increased ease of respirations will increase Outcome: Progressing   Problem: Role Relationship: Goal: Family's ability to cope with current situation will improve Outcome: Progressing Goal: Ability to verbalize concerns, feelings, and thoughts to partner or family member will improve Outcome: Progressing   Problem: Pain Management: Goal: Satisfaction with pain management regimen will  improve Outcome: Progressing

## 2023-05-31 NOTE — TOC Progression Note (Signed)
Transition of Care [facility]) - Progression Note    Patient Details  Name: Laurie Harris MRN: [id number] Date of Birth: 21-Apr-1941  [facility]) CM/SW Contact  Allena Katz, LCSW Phone Number: 05/31/2023, 3:25 PM  Clinical Narrative:   CSW spoke with daughter about not qualifying for hospice house. [facility] but reports the first month will be 10,000. Daughter reports she is unable to afford and would like for Hospice house to re-evaluate.          Expected Discharge Plan and Services                                               Social Determinants of Health (SDOH) Interventions SDOH Screenings   Food Insecurity: No Food Insecurity (05/25/2023)  Housing: Low Risk  (05/25/2023)  Transportation Needs: No Transportation Needs (05/25/2023)  Utilities: Not At Risk (05/25/2023)  Depression (PHQ2-9): High Risk (05/10/2023)  Financial Resource Strain: Low Risk  (05/09/2023)  Physical Activity: Inactive (05/09/2023)  Social Connections: Moderately Integrated (05/09/2023)  Stress: Patient Declined (05/09/2023)  Tobacco Use: Low Risk  (05/25/2023)    Readmission Risk Interventions     No data to display

## 2023-05-31 NOTE — Progress Notes (Signed)
Progress Note   Patient: Laurie Harris:[id number] DOB: [birth year] DOA: 05/24/2023     6 DOS: the patient was seen and examined on 06-07-2023   Brief hospital course: Taken from H&P.   Laurie Harris is a 82 y.o. female with medical history significant of ICH cerebella 2016, CKD stage IIIa, IDDM, HTN, HLD, thrombocytopenia, carotid artery stenosis, who presents with altered mental status and fall.   Patient was hospitalized from 9/19 - 9/20 due to altered mental status.  Her mental status has been declining recently.  Patient had slurred speech and unsteady gait.  She had negative MRI of brain, and EEG was with diffuse slowing and no seizure.  TSH is slightly elevated 6.15, but free T4 was normal.  Vitamin B12 level was normal.   Psychiatry evaluation, (+) grief reaction. Pt  was discharged to ALF.   Per report, pt was noted to be more confused and has had multiple falls.  Data reviewed and ED Course: pt was found to have WBC 6.9, GFR> 60, trop  60,  lactic acid 2.5, negative PCR for COVID, flu and RSV, hypothermia with temperature 94.6, blood pressure 152/83, heart rate of 110, RR 23, oxygen sat 99% on room air.  Chest x-ray negative.  CT head negative for acute intracranial abnormalities.  X-ray of pelvis negative.   EKG with sinus rhythm, QTc 499, LAD, anteroseptal infarction pattern, poor R wave progression.  Overnight due to rising troponin, she was started on heparin infusion.  Patient also met sepsis criteria due to hypothermia and tachycardia, no obvious source of infection.  She was given Lawyer and started on broad-spectrum antibiotics.  9/21: Improving hypothermia with temperature at 96 now, saturating well on room air.  Procalcitonin 0.13, lactic acidosis resolved.  Due to worsening troponin cardiology was also consulted, likely demand ischemia with hypothermia. Patient has an outpatient appointment with neurology on 06/17/2023 for her rapidly declining mental status  over the past few weeks.  Patient with a steep decline since 06-27-2024after the death of her son.  Echocardiogram was also ordered, cardiology is recommending conservative management for now. UDS was positive for ecstasy on 05/23/2023??  Palliative care was also consulted.  9/22: Vital stable but clinically seems worsening, started having difficulty with swallowing and appears more lethargic.  Swallow evaluation ordered.  Seems like patient is approaching end-of-life.  Palliative care to discuss with family regarding transitioning to comfort care.  9/23: Patient was transition to full comfort care by family.  She does not qualify for inpatient hospice services at this time.  Family to decide about proceeding with hospice at her facility or take her back home with hospice.  9/24: Currently medically stable with waxing and waning mental status.  Still unable to eat regular food.  Unfortunately her prior assisted living facility cannot take her back with hospice.  She does not qualify going to an hospice facility, [facility] and daughter is trying to explore other options. 9/25.  Patient needed help eating. 9/26.  Patient less talkative today.  Has her mouth open.  Daughter states last night that she was seeing people that already passed away. 07-Jun-2023.  No hospice home bed availability currently.  Assessment and Plan: * End of life care Patient over the last 3 days has declined.  Currently no beds at hospice home.  Continue comfort care measures.  Acute metabolic encephalopathy Patient with waxing and waning mental status over the past few weeks, rapid cognitive decline since the death of  her son.  All imaging negative including MRI brain.  EEG was done during most recent admission and it was negative for any seizure. Patient on dysphagia diet and not eating much (pocketing food in mouth today). Made comfort care measures.  NSTEMI (non-ST elevated myocardial infarction) (HCC) Troponin peaked at 3148.   Made comfort care.  SIRS (systemic inflammatory response syndrome) (HCC) Patient meets criteria for SIRS with heart rate of 110, RR 23, hypothermic with temperature 94.6, no leukocytosis.  Lactic acid 2.5.  Made comfort care.  Did not have a sepsis source so sepsis ruled out.  Type II diabetes mellitus with renal manifestations (HCC) Medications discontinued since comfort care.  Benign essential HTN Medications discontinued  Thrombocytopenia (HCC) Chronic in nature  Hyperlipidemia Medications discontinued  Stage 3a chronic kidney disease (CKD) (HCC) Last creatinine 0.82.  Fall Patient with recurrent falls.  Multiple bruises.        Subjective: Patient less talkative today.  Shook her head yes when I asked if she was hungry.  I gave her 1 bite of food and she just pocketed in the side of her mouth.  On comfort care measures.  Physical Exam: Vitals:   05/28/23 0502 05/29/23 0803 05/30/23 0736 05/31/23 0747  BP: (!) 163/63 (!) 183/85 (!) 154/66 (!) 130/51  Pulse: 95 82 88 73  Resp:  20 15 16   Temp:  (!) 97.2 F (36.2 C) (!) 97 F (36.1 C) 97.6 F (36.4 C)  TempSrc:      SpO2: 97% 100% 100% 100%  Weight:      Height:       Physical Exam HENT:     Mouth/Throat:     Pharynx: No oropharyngeal exudate.  Eyes:     General: Lids are normal.  Cardiovascular:     Rate and Rhythm: Normal rate and regular rhythm.     Heart sounds: Normal heart sounds, S1 normal and S2 normal.  Pulmonary:     Breath sounds: Examination of the right-lower field reveals decreased breath sounds. Examination of the left-lower field reveals decreased breath sounds. Decreased breath sounds present. No wheezing, rhonchi or rales.  Abdominal:     Palpations: Abdomen is soft.     Tenderness: There is no abdominal tenderness.  Musculoskeletal:     Right lower leg: No swelling.     Left lower leg: No swelling.  Skin:    General: Skin is warm.     Findings: No rash.  Neurological:     Mental  Status: She is lethargic.     Data Reviewed: No new data  Family Communication: Updated patient's daughter on the phone  Disposition: Status is: Inpatient Remains inpatient appropriate because: Receiving comfort care measures.  No hospice home bed availability today  Planned Discharge Destination: Hospice facility    Time spent: 26 minutes  Author: Alford Highland, MD 05/31/2023 2:19 PM  For on call review [email or web address].

## 2023-05-31 NOTE — Care Management Important Message (Signed)
Important Message  Patient Details  Name: Laurie Harris MRN: [id number] Date of Birth: [birth year]   Important Message Given:  Other (see comment)  Patient remains on comfort care and re-evaluation requested for hospice home. Out of respect for the patient and family no Important Message from [facility] given.    Laurie Harris 05/31/2023, 8:18 AM

## 2023-05-31 NOTE — Progress Notes (Addendum)
[facility]- [employer] engineer, [employer]  Patient not appropriate for admission to the Hospice Home at this time.  Above information shared with medical care team at [facility], as well as patient's daughter.   We are happy to re-assess for [facility] appropriateness if patient's medical condition changes.   Please don't hesitate to call with any Hospice related questions or concerns.    Thank you for the opportunity to participate in this patient's care.  [facility] Liaison [phone]

## 2023-05-31 NOTE — Progress Notes (Signed)
Daily Progress Note   Patient Name: Laurie Harris       Date: 05/31/2023 DOB: [birth year]  Age: 82 y.o. MRN#: [id number] Attending Physician: Alford Highland, MD Primary Care Physician: Glori Luis, MD Admit Date: 05/24/2023  Reason for Consultation/Follow-up: Establishing goals of care  HPI/Brief Hospital Review: 82 y.o. female  with past medical history of ICH cerebellar in 2016, CKD stage 3a, IDDM, HTN, HLD, thrombocytopenia, carotid artery stenosis admitted from [facility] on 05/24/2023 with AMS and multiple falls.   Noted recent hospitalization 9/19-9/20 due to AMS-completed stroke work up including MRI of brain and EEG both negative-discharged and returned to ALF    Palliative medicine was consulted for assisting with goals of care conversations.  Transitioned to full comfort measures 9/22.  Subjective: Extensive chart review has been completed prior to meeting patient including labs, vital signs, imaging, progress notes, orders, and available advanced directive documents from current and previous encounters.    Visited with Laurie Harris at her bedside. Resting in bed with eyes open, visiting with family member (cousin) at bedside. Appears comfortable, no sings of distress or discomfort.  Review of MAR, PRN medications maintaining comfort at this time, no changes to comfort medications needed at this time.  Spoke with HL, hospice MD reviewing case for IPU appropriateness, will connect with HL later today.  Called and spoke with daughter-[name], reviewed plan of care-comfort measures remain, aware disposition still in progress.  Reassessed by HL and MD, remains inappropriate for IPU. Will follow through weekend. Noted decline in overall condition since last assessment, may  anticipate hospital passing.   Care plan was discussed with primary team, TOC, HL.  Thank you for allowing the Palliative Medicine Team to assist in the care of this patient.  Total time:  35 minutes  Time spent includes: Detailed review of medical records (labs, imaging, vital signs), medically appropriate exam (mental status, respiratory, cardiac, skin), discussed with treatment team, counseling and educating patient, family and staff, documenting clinical information, medication management and coordination of care.  Leeanne Deed, DNP, AGNP-C Palliative Medicine   Please contact Palliative Medicine Team phone at [phone] for questions and concerns.

## 2023-06-01 DIAGNOSIS — E1121 Type 2 diabetes mellitus with diabetic nephropathy: Secondary | ICD-10-CM

## 2023-06-01 DIAGNOSIS — I214 Non-ST elevation (NSTEMI) myocardial infarction: Secondary | ICD-10-CM | POA: Diagnosis not present

## 2023-06-01 DIAGNOSIS — G9340 Encephalopathy, unspecified: Secondary | ICD-10-CM | POA: Diagnosis not present

## 2023-06-01 DIAGNOSIS — R6510 Systemic inflammatory response syndrome (SIRS) of non-infectious origin without acute organ dysfunction: Secondary | ICD-10-CM | POA: Diagnosis not present

## 2023-06-01 DIAGNOSIS — Z515 Encounter for palliative care: Secondary | ICD-10-CM | POA: Diagnosis not present

## 2023-06-01 DIAGNOSIS — G9341 Metabolic encephalopathy: Secondary | ICD-10-CM | POA: Diagnosis not present

## 2023-06-01 MED ORDER — MORPHINE SULFATE (CONCENTRATE) 10 MG/0.5ML PO SOLN: 5.0000 mg | ORAL | Status: DC | PRN

## 2023-06-01 NOTE — Progress Notes (Signed)
Daily Progress Note   Patient Name: Laurie Harris       Date: 06/01/2023 DOB: 1941/02/16  Age: 82 y.o. MRN#: [id number] Attending Physician: Alford Highland, MD Primary Care Physician: Glori Luis, MD Admit Date: 05/24/2023  Reason for Consultation/Follow-up: Establishing goals of care  HPI/Brief Hospital Review: 82 y.o. female  with past medical history of ICH cerebellar in 2016, CKD stage 3a, IDDM, HTN, HLD, thrombocytopenia, carotid artery stenosis admitted from [facility] on 05/24/2023 with AMS and multiple falls.   Noted recent hospitalization 9/19-9/20 due to AMS-completed stroke work up including MRI of brain and EEG both negative-discharged and returned to ALF    Palliative medicine was consulted for assisting with goals of care conversations.   Transitioned to full comfort measures 9/22.  Subjective: Extensive chart review has been completed prior to meeting patient including labs, vital signs, imaging, progress notes, orders, and available advanced directive documents from current and previous encounters.    Visited with Laurie Harris at her bedside. She is resting in bed with her eyes open, acknowledges my presence in room, attempting to communicate, shares she "wants to go home." No family at bedside at time of visit.  Nursing staff at bedside shares Laurie Harris able to tolerate a few bites for breakfast, not requiring PRN medications at this time.  TOC continues to work on Engineer, manufacturing.  Care plan was discussed with nursing staff.  Thank you for allowing the Palliative Medicine Team to assist in the care of this patient.  Total time:  25 minutes  Time spent includes: Detailed review of medical records (labs, imaging, vital signs), medically appropriate exam  (mental status, respiratory, cardiac, skin), discussed with treatment team, counseling and educating patient, family and staff, documenting clinical information, medication management and coordination of care.  Leeanne Deed, DNP, AGNP-C Palliative Medicine   Please contact Palliative Medicine Team phone at [phone] for questions and concerns.

## 2023-06-01 NOTE — Progress Notes (Signed)
Progress Note   Patient: Laurie Harris:[id number] DOB: 03/24/1941 DOA: 05/24/2023     7 DOS: the patient was seen and examined on 06/01/2023   Brief hospital course: Taken from H&P.   Laurie Harris is a 82 y.o. female with medical history significant of ICH cerebella 2016, CKD stage IIIa, IDDM, HTN, HLD, thrombocytopenia, carotid artery stenosis, who presents with altered mental status and fall.   Patient was hospitalized from 9/19 - 9/20 due to altered mental status.  Her mental status has been declining recently.  Patient had slurred speech and unsteady gait.  She had negative MRI of brain, and EEG was with diffuse slowing and no seizure.  TSH is slightly elevated 6.15, but free T4 was normal.  Vitamin B12 level was normal.   Psychiatry evaluation, (+) grief reaction. Pt  was discharged to ALF.   Per report, pt was noted to be more confused and has had multiple falls.  Data reviewed and ED Course: pt was found to have WBC 6.9, GFR> 60, trop  60,  lactic acid 2.5, negative PCR for COVID, flu and RSV, hypothermia with temperature 94.6, blood pressure 152/83, heart rate of 110, RR 23, oxygen sat 99% on room air.  Chest x-ray negative.  CT head negative for acute intracranial abnormalities.  X-ray of pelvis negative.   EKG with sinus rhythm, QTc 499, LAD, anteroseptal infarction pattern, poor R wave progression.  Overnight due to rising troponin, she was started on heparin infusion.  Patient also met sepsis criteria due to hypothermia and tachycardia, no obvious source of infection.  She was given Lawyer and started on broad-spectrum antibiotics.  9/21: Improving hypothermia with temperature at 96 now, saturating well on room air.  Procalcitonin 0.13, lactic acidosis resolved.  Due to worsening troponin cardiology was also consulted, likely demand ischemia with hypothermia. Patient has an outpatient appointment with neurology on 06/17/2023 for her rapidly declining mental status  over the past few weeks.  Patient with a steep decline since 06/23/2024after the death of her son.  Echocardiogram was also ordered, cardiology is recommending conservative management for now. UDS was positive for ecstasy on 05/23/2023??  Palliative care was also consulted.  9/22: Vital stable but clinically seems worsening, started having difficulty with swallowing and appears more lethargic.  Swallow evaluation ordered.  Seems like patient is approaching end-of-life.  Palliative care to discuss with family regarding transitioning to comfort care. 9/23: Patient was transition to full comfort care by family.  She does not qualify for inpatient hospice services at this time.  Family to decide about proceeding with hospice at her facility or take her back home with hospice. 9/24: Currently medically stable with waxing and waning mental status.  Still unable to eat regular food.  Unfortunately her prior assisted living facility cannot take her back with hospice.  She does not qualify going to an hospice facility, [facility] and daughter is trying to explore other options. 9/25.  Patient needed help eating. 9/26.  Patient less talkative today.  Has her mouth open.  Daughter states last night that she was seeing people that already passed away. 2023-06-03.  No hospice home bed availability currently. 9/28  Patient was not accepted to the hospice home.  Continue end-of-life care here.  Assessment and Plan: * End of life care Patient not accepted to hospice home.  Continue comfort care measures here in hospital.  Cost for a facility is too expensive.  Patient's daughter unable to take care of  her at home with hospice.  Acute metabolic encephalopathy Patient with waxing and waning mental status over the past few weeks, rapid cognitive decline since the death of her son.  All imaging negative including MRI brain.  EEG was done during most recent admission and it was negative for any seizure. Patient on dysphagia  diet. Patient more alert today and talkative today than the last 2 days.  NSTEMI (non-ST elevated myocardial infarction) (HCC) Troponin peaked at 3148.  Made comfort care.  SIRS (systemic inflammatory response syndrome) (HCC) Patient meets criteria for SIRS with heart rate of 110, RR 23, hypothermic with temperature 94.6, no leukocytosis.  Lactic acid 2.5.  Made comfort care.  Did not have a sepsis source so sepsis ruled out.  Type II diabetes mellitus with renal manifestations (HCC) Medications discontinued since comfort care.  Benign essential HTN Medications discontinued  Thrombocytopenia (HCC) Chronic in nature  Hyperlipidemia Medications discontinued  Stage 3a chronic kidney disease (CKD) (HCC) Last creatinine 0.82.  Fall Patient with recurrent falls.  Multiple bruises.        Subjective: I asked the patient if she was hungry and she shook her head yes.  I got her a bite of food and she asked me what it is.  Since it is.  I was not sure what it was and then she told me she is not eating it.  Patient not in any pain.  Initially admitted with slurred speech and unsteady gait.  Physical Exam: Vitals:   05/28/23 0502 05/29/23 0803 05/30/23 0736 05/31/23 0747  BP: (!) 163/63 (!) 183/85 (!) 154/66 (!) 130/51  Pulse: 95 82 88 73  Resp:  20 15 16   Temp:  (!) 97.2 F (36.2 C) (!) 97 F (36.1 C) 97.6 F (36.4 C)  TempSrc:      SpO2: 97% 100% 100% 100%  Weight:      Height:       Physical Exam HENT:     Mouth/Throat:     Pharynx: No oropharyngeal exudate.  Eyes:     General: Lids are normal.  Cardiovascular:     Rate and Rhythm: Normal rate and regular rhythm.     Heart sounds: Normal heart sounds, S1 normal and S2 normal.  Pulmonary:     Breath sounds: Examination of the right-lower field reveals decreased breath sounds. Examination of the left-lower field reveals decreased breath sounds. Decreased breath sounds present. No wheezing, rhonchi or rales.   Abdominal:     Palpations: Abdomen is soft.     Tenderness: There is no abdominal tenderness.  Musculoskeletal:     Right lower leg: No swelling.     Left lower leg: No swelling.  Skin:    General: Skin is warm.     Comments: Bruising on arms and legs and right side of face.  Neurological:     Mental Status: She is alert.     Data Reviewed: No new data  Family Communication: Spoke with daughter on the phone  Disposition: Status is: Inpatient Remains inpatient appropriate because: Apparently not a candidate for hospice home yet.  Patient's daughter unable to take care of her at home with hospice.  Rehab facility because it is expensive.  Likely will be an in-hospital death.  Planned Discharge Destination: Likely will stay in hospital.    Time spent: 26 minutes  Author: Alford Highland, MD 06/01/2023 12:27 PM  For on call review [email or web address].

## 2023-06-01 NOTE — Plan of Care (Signed)
Patient remains on Comfort Measures Problem: Metabolic: Goal: Ability to maintain appropriate glucose levels will improve Outcome: Progressing   Problem: Pain Managment: Goal: General experience of comfort will improve Outcome: Progressing   Problem: Safety: Goal: Ability to remain free from injury will improve Outcome: Progressing   Problem: Clinical Measurements: Goal: Quality of life will improve Outcome: Progressing   Problem: Pain Management: Goal: Satisfaction with pain management regimen will improve Outcome: Progressing   Problem: Education: Goal: Ability to describe self-care measures that may prevent or decrease complications (Diabetes Survival Skills Education) will improve Outcome: Not Progressing   Problem: Coping: Goal: Ability to adjust to condition or change in health will improve Outcome: Not Progressing   Problem: Fluid Volume: Goal: Ability to maintain a balanced intake and output will improve Outcome: Not Progressing   Problem: Health Behavior/Discharge Planning: Goal: Ability to identify and utilize available resources and services will improve Outcome: Not Progressing Goal: Ability to manage health-related needs will improve Outcome: Not Progressing   Problem: Nutritional: Goal: Maintenance of adequate nutrition will improve Outcome: Not Progressing Goal: Progress toward achieving an optimal weight will improve Outcome: Not Progressing   Problem: Skin Integrity: Goal: Risk for impaired skin integrity will decrease Outcome: Not Progressing   Problem: Tissue Perfusion: Goal: Adequacy of tissue perfusion will improve Outcome: Not Progressing   Problem: Education: Goal: Knowledge of General Education information will improve Description: Including pain rating scale, medication(s)/side effects and non-pharmacologic comfort measures Outcome: Not Progressing   Problem: Health Behavior/Discharge Planning: Goal: Ability to manage health-related  needs will improve Outcome: Not Progressing   Problem: Clinical Measurements: Goal: Ability to maintain clinical measurements within normal limits will improve Outcome: Not Progressing Goal: Will remain free from infection Outcome: Not Progressing Goal: Diagnostic test results will improve Outcome: Not Progressing Goal: Respiratory complications will improve Outcome: Not Progressing Goal: Cardiovascular complication will be avoided Outcome: Not Progressing   Problem: Activity: Goal: Risk for activity intolerance will decrease Outcome: Not Progressing   Problem: Nutrition: Goal: Adequate nutrition will be maintained Outcome: Not Progressing   Problem: Coping: Goal: Level of anxiety will decrease Outcome: Not Progressing   Problem: Elimination: Goal: Will not experience complications related to bowel motility Outcome: Not Progressing Goal: Will not experience complications related to urinary retention Outcome: Not Progressing   Problem: Skin Integrity: Goal: Risk for impaired skin integrity will decrease Outcome: Not Progressing   Problem: Coping: Goal: Ability to identify and develop effective coping behavior will improve Outcome: Not Progressing   Problem: Respiratory: Goal: Verbalizations of increased ease of respirations will increase Outcome: Not Progressing   Problem: Education: Goal: Individualized Educational Video(s) Outcome: Not Applicable

## 2023-06-02 DIAGNOSIS — R6510 Systemic inflammatory response syndrome (SIRS) of non-infectious origin without acute organ dysfunction: Secondary | ICD-10-CM | POA: Diagnosis not present

## 2023-06-02 DIAGNOSIS — G9340 Encephalopathy, unspecified: Secondary | ICD-10-CM | POA: Diagnosis not present

## 2023-06-02 DIAGNOSIS — G9341 Metabolic encephalopathy: Secondary | ICD-10-CM | POA: Diagnosis not present

## 2023-06-02 DIAGNOSIS — Z515 Encounter for palliative care: Secondary | ICD-10-CM | POA: Diagnosis not present

## 2023-06-02 DIAGNOSIS — I214 Non-ST elevation (NSTEMI) myocardial infarction: Secondary | ICD-10-CM | POA: Diagnosis not present

## 2023-06-02 MED ORDER — HALOPERIDOL LACTATE 2 MG/ML PO CONC: 0.6000 mg | ORAL | Status: DC | PRN

## 2023-06-02 MED ORDER — LORAZEPAM 2 MG/ML PO CONC: 1.0000 mg | ORAL | Status: DC | PRN

## 2023-06-02 MED ORDER — GLYCOPYRROLATE 0.2 MG/ML IJ SOLN: 0.2000 mg | INTRAMUSCULAR | Status: DC | PRN

## 2023-06-02 MED ORDER — ONDANSETRON 4 MG PO TBDP: 4.0000 mg | ORAL_TABLET | Freq: Four times a day (QID) | ORAL | Status: DC | PRN

## 2023-06-02 MED ORDER — MORPHINE SULFATE (CONCENTRATE) 10 MG/0.5ML PO SOLN: 5.0000 mg | ORAL | Status: DC | PRN

## 2023-06-02 MED ORDER — ACETAMINOPHEN 650 MG RE SUPP: 650.0000 mg | Freq: Four times a day (QID) | RECTAL | Status: DC | PRN

## 2023-06-02 MED ORDER — BIOTENE DRY MOUTH MT LIQD: 15.0000 mL | OROMUCOSAL | Status: DC | PRN

## 2023-06-02 NOTE — Plan of Care (Signed)
  Problem: Education: Goal: Ability to describe self-care measures that may prevent or decrease complications (Diabetes Survival Skills Education) will improve Outcome: Adequate for Discharge   Problem: Coping: Goal: Ability to adjust to condition or change in health will improve Outcome: Adequate for Discharge   Problem: Fluid Volume: Goal: Ability to maintain a balanced intake and output will improve Outcome: Adequate for Discharge   Problem: Health Behavior/Discharge Planning: Goal: Ability to identify and utilize available resources and services will improve Outcome: Adequate for Discharge Goal: Ability to manage health-related needs will improve Outcome: Adequate for Discharge   Problem: Metabolic: Goal: Ability to maintain appropriate glucose levels will improve Outcome: Adequate for Discharge   Problem: Nutritional: Goal: Maintenance of adequate nutrition will improve Outcome: Adequate for Discharge Goal: Progress toward achieving an optimal weight will improve Outcome: Adequate for Discharge   Problem: Skin Integrity: Goal: Risk for impaired skin integrity will decrease Outcome: Adequate for Discharge   Problem: Tissue Perfusion: Goal: Adequacy of tissue perfusion will improve Outcome: Adequate for Discharge   Problem: Education: Goal: Knowledge of General Education information will improve Description: Including pain rating scale, medication(s)/side effects and non-pharmacologic comfort measures Outcome: Adequate for Discharge   Problem: Health Behavior/Discharge Planning: Goal: Ability to manage health-related needs will improve Outcome: Adequate for Discharge   Problem: Clinical Measurements: Goal: Ability to maintain clinical measurements within normal limits will improve Outcome: Adequate for Discharge Goal: Will remain free from infection Outcome: Adequate for Discharge Goal: Diagnostic test results will improve Outcome: Adequate for Discharge Goal:  Respiratory complications will improve Outcome: Adequate for Discharge Goal: Cardiovascular complication will be avoided Outcome: Adequate for Discharge   Problem: Activity: Goal: Risk for activity intolerance will decrease Outcome: Adequate for Discharge   Problem: Nutrition: Goal: Adequate nutrition will be maintained Outcome: Adequate for Discharge   Problem: Coping: Goal: Level of anxiety will decrease Outcome: Adequate for Discharge   Problem: Elimination: Goal: Will not experience complications related to bowel motility Outcome: Adequate for Discharge Goal: Will not experience complications related to urinary retention Outcome: Adequate for Discharge   Problem: Pain Managment: Goal: General experience of comfort will improve Outcome: Adequate for Discharge   Problem: Safety: Goal: Ability to remain free from injury will improve Outcome: Adequate for Discharge   Problem: Skin Integrity: Goal: Risk for impaired skin integrity will decrease Outcome: Adequate for Discharge   Problem: Education: Goal: Knowledge of the prescribed therapeutic regimen will improve Outcome: Adequate for Discharge   Problem: Coping: Goal: Ability to identify and develop effective coping behavior will improve Outcome: Adequate for Discharge   Problem: Clinical Measurements: Goal: Quality of life will improve Outcome: Adequate for Discharge   Problem: Respiratory: Goal: Verbalizations of increased ease of respirations will increase Outcome: Adequate for Discharge   Problem: Role Relationship: Goal: Family's ability to cope with current situation will improve Outcome: Adequate for Discharge Goal: Ability to verbalize concerns, feelings, and thoughts to partner or family member will improve Outcome: Adequate for Discharge   Problem: Pain Management: Goal: Satisfaction with pain management regimen will improve Outcome: Adequate for Discharge

## 2023-06-02 NOTE — Progress Notes (Signed)
MD order received in [facility] to discharge pt to the [facility] today; report called to [facility] at [phone] and spoke to [place]; pt's discharge pending the pt's daughter going to the Hospice [facility] and filling out the paperwork, once this is completed [facility] will be contacted for nonemergency transport/discharge to the [facility]ospice Home

## 2023-06-02 NOTE — Progress Notes (Signed)
[facility] NOTE  Follow up on new referral for [facility]) who previously was not appropriate.  Patient now with acute symptom management needs and appears to be transitioning to EOL.  Patient approved for GIP Level of Care by Dr. Alphonsus Sias, hospice MD with terminal diagnosis CVD.  Bed offered and accepted by patient's daughter Haywood Lasso.  She can complete consents this afternoon after 1400.   Medical care team notified and updated on above.  This RN will arrange EMS transport when hospital ready for discharge.and after consents completed.  [facility] RN to call report to [facility]osp[facility] [phone].  Please send signed DNR with patient to [facility].  Thank you for allowing participation in this patient's care.  Norris Cross, RN Nurse Liaison [phone]

## 2023-06-02 NOTE — Discharge Summary (Signed)
Physician Discharge Summary   Patient: Laurie Harris MRN: [id number] DOB: [birth year]  Admit date:     05/24/2023  Discharge date: 06/02/23  Discharge Physician: Alford Highland   PCP: Glori Luis, MD   Recommendations at discharge:   Discharge to hospice home and follow-up with team and hospice home 1 day.  Discharge Diagnoses: Principal Problem:   End of life care Active Problems:   Acute metabolic encephalopathy   NSTEMI (non-ST elevated myocardial infarction) (HCC)   SIRS (systemic inflammatory response syndrome) (HCC)   Type II diabetes mellitus with renal manifestations (HCC)   Benign essential HTN   Hyperlipidemia   Thrombocytopenia (HCC)   Stage 3a chronic kidney disease (CKD) (HCC)   Fall   Recurrent falls   Hypothermia    Hospital Course: Taken from H&P.   Laurie Harris is a 82 y.o. female with medical history significant of ICH cerebella 2016, CKD stage IIIa, IDDM, HTN, HLD, thrombocytopenia, carotid artery stenosis, who presents with altered mental status and fall.   Patient was hospitalized from 9/19 - 9/20 due to altered mental status.  Her mental status has been declining recently.  Patient had slurred speech and unsteady gait.  She had negative MRI of brain, and EEG was with diffuse slowing and no seizure.  TSH is slightly elevated 6.15, but free T4 was normal.  Vitamin B12 level was normal.   Psychiatry evaluation, (+) grief reaction. Pt  was discharged to ALF.   Per report, pt was noted to be more confused and has had multiple falls.  Data reviewed and ED Course: pt was found to have WBC 6.9, GFR> 60, trop  60,  lactic acid 2.5, negative PCR for COVID, flu and RSV, hypothermia with temperature 94.6, blood pressure 152/83, heart rate of 110, RR 23, oxygen sat 99% on room air.  Chest x-ray negative.  CT head negative for acute intracranial abnormalities.  X-ray of pelvis negative.   EKG with sinus rhythm, QTc 499, LAD, anteroseptal infarction  pattern, poor R wave progression.  Overnight due to rising troponin, she was started on heparin infusion.  Patient also met sepsis criteria due to hypothermia and tachycardia, no obvious source of infection.  She was given Lawyer and started on broad-spectrum antibiotics.  9/21: Improving hypothermia with temperature at 96 now, saturating well on room air.  Procalcitonin 0.13, lactic acidosis resolved.  Due to worsening troponin cardiology was also consulted, likely demand ischemia with hypothermia. Patient has an outpatient appointment with neurology on 06/17/2023 for her rapidly declining mental status over the past few weeks.  Patient with a steep decline since July 14, 2024after the death of her son.  Echocardiogram was also ordered, cardiology is recommending conservative management for now. UDS was positive for ecstasy on 05/23/2023??  Palliative care was also consulted.  9/22: Vital stable but clinically seems worsening, started having difficulty with swallowing and appears more lethargic.  Swallow evaluation ordered.  Seems like patient is approaching end-of-life.  Palliative care to discuss with family regarding transitioning to comfort care. 9/23: Patient was transition to full comfort care by family.  She does not qualify for inpatient hospice services at this time.  Family to decide about proceeding with hospice at her facility or take her back home with hospice. 9/24: Currently medically stable with waxing and waning mental status.  Still unable to eat regular food.  Unfortunately her prior assisted living facility cannot take her back with hospice.  She does not qualify going to  an hospice facility, [facility] and daughter is trying to explore other options. 9/25.  Patient needed help eating. 9/26.  Patient less talkative today.  Has her mouth open.  Daughter states last night that she was seeing people that already passed away. 06/30/23.  No hospice home bed availability currently. 9/28   Patient was not accepted to the hospice home.  Continue end-of-life care here. 9/29.  Patient moved her arms to sternal rub.  Lost IV access.  Accepted to hospice home for this afternoon.  Assessment and Plan: * End of life care Just notified patient accepted to hospice home.  Continue end-of-life care.  Since lost IV access, can do Roxanol orally/sublingually.  Glycopyrrolate can be given subcutaneous injection, Haldol and lorazepam, given sublingually also.  Acute metabolic encephalopathy Patient with waxing and waning mental status over the past few weeks, rapid cognitive decline since the death of her son.  All imaging negative including MRI brain.  EEG was done during most recent admission and it was negative for any seizure. Patient on dysphagia diet. Patient moved arms to sternal rub today  NSTEMI (non-ST elevated myocardial infarction) (HCC) Troponin peaked at 3148.  Made comfort care.  SIRS (systemic inflammatory response syndrome) (HCC) Patient meets criteria for SIRS with heart rate of 110, RR 23, hypothermic with temperature 94.6, no leukocytosis.  Lactic acid 2.5.  Made comfort care.  Did not have a sepsis source so sepsis ruled out.  Type II diabetes mellitus with renal manifestations (HCC) Medications discontinued since comfort care.  Benign essential HTN Medications discontinued  Thrombocytopenia (HCC) Chronic in nature  Hyperlipidemia Medications discontinued  Stage 3a chronic kidney disease (CKD) (HCC) Last creatinine 0.82.  Fall Patient with recurrent falls.  Multiple bruises.         Consultants: Palliative care, hospice liaison Procedures performed: None Disposition: Hospice facility  diet recommendation:  Dysphagia 1 if tolerated likely will not be able to tolerate. DISCHARGE MEDICATION: Allergies as of 06/02/2023       Reactions   Warfarin And Related    Possible stroke   Warfarin         Medication List     STOP taking these  medications    acetaminophen 325 MG tablet Commonly known as: TYLENOL Replaced by: acetaminophen 650 MG suppository   aspirin EC 81 MG tablet   BD AutoShield Duo 30G X 5 MM Misc Generic drug: Insulin Pen Needle   benazepril 5 MG tablet Commonly known as: LOTENSIN   calcium carbonate 600 MG Tabs tablet Commonly known as: OS-CAL   fenofibrate 160 MG tablet   HumaLOG Mix 75/25 KwikPen (75-25) 100 UNIT/ML KwikPen Generic drug: Insulin Lispro Prot & Lispro   hydrocortisone 2.5 % rectal cream Commonly known as: ANUSOL-HC   multivitamin with minerals Tabs tablet   NovoFine Plus 32G X 4 MM Misc Generic drug: Insulin Pen Needle   Oyster Shell 500 MG Tabs   pantoprazole 40 MG tablet Commonly known as: PROTONIX   rosuvastatin 40 MG tablet Commonly known as: CRESTOR   Vitamin D3 50 MCG (2000 UT) Tabs       TAKE these medications    acetaminophen 650 MG suppository Commonly known as: TYLENOL Place 1 suppository (650 mg total) rectally every 6 (six) hours as needed for mild pain (or Fever >/= 101). Replaces: acetaminophen 325 MG tablet   antiseptic oral rinse Liqd Apply 15 mLs topically as needed for dry mouth.   glycopyrrolate 0.2 MG/ML injection Commonly known as: ROBINUL Inject 1  mL (0.2 mg total) into the skin every 4 (four) hours as needed (excessive secretions).   haloperidol 2 MG/ML solution Commonly known as: HALDOL Place 0.3 mLs (0.6 mg total) under the tongue every 4 (four) hours as needed for agitation (or delirium).   LORazepam 2 MG/ML concentrated solution Commonly known as: ATIVAN Place 0.5 mLs (1 mg total) under the tongue every 4 (four) hours as needed for anxiety.   morphine CONCENTRATE 10 MG/0.5ML Soln concentrated solution Place 0.25 mLs (5 mg total) under the tongue every 2 (two) hours as needed for moderate pain (dyspnea).   ondansetron 4 MG disintegrating tablet Commonly known as: ZOFRAN-ODT Take 1 tablet (4 mg total) by mouth every 6  (six) hours as needed for nausea.        Discharge Exam: Filed Weights   05/26/23 1500  Weight: 55.8 kg   Physical Exam HENT:     Mouth/Throat:     Pharynx: No oropharyngeal exudate.  Eyes:     General: Lids are normal.  Cardiovascular:     Rate and Rhythm: Normal rate and regular rhythm.     Heart sounds: Normal heart sounds, S1 normal and S2 normal.  Pulmonary:     Breath sounds: Examination of the right-lower field reveals decreased breath sounds. Examination of the left-lower field reveals decreased breath sounds. Decreased breath sounds present. No wheezing, rhonchi or rales.  Abdominal:     Palpations: Abdomen is soft.     Tenderness: There is no abdominal tenderness.  Musculoskeletal:     Right lower leg: No swelling.     Left lower leg: No swelling.  Skin:    General: Skin is warm.     Comments: Bruising on arms and legs and right side of face.  Neurological:     Mental Status: She is lethargic.     Comments: Moved her arms with sternal rub but did not talk.      Condition at discharge: Guarded  The results of significant diagnostics from this hospitalization (including imaging, microbiology, ancillary and laboratory) are listed below for reference.   Imaging Studies: CT Head Wo Contrast  Result Date: 05/25/2023 CLINICAL DATA:  Mental status change, unknown cause. Seen here 05/23/2023 for left-sided stroke symptoms but had a negative brain MRI. EXAM: CT HEAD WITHOUT CONTRAST TECHNIQUE: Contiguous axial images were obtained from the base of the skull through the vertex without intravenous contrast. RADIATION DOSE REDUCTION: This exam was performed according to the departmental dose-optimization program which includes automated exposure control, adjustment of the mA and/or kV according to patient size and/or use of iterative reconstruction technique. COMPARISON:  Head CT and MRI brain both 05/23/2023, most recent was the MRI 1:00 p.m. FINDINGS: Brain: There is mild  to moderate global atrophy with atrophic ventriculomegaly and mild small vessel disease of the cerebral white matter with chronic mineralization in the basal ganglia. No new asymmetry is seen concerning for a cortical based acute infarct, hemorrhage, mass or mass effect. There is no midline shift. The basal cisterns are clear. Vascular: Both carotid siphons and distal vertebral arteries are heavily calcified. No hyperdense central vessel is seen. Skull: Negative for fractures or focal lesions, but there is a new finding of a small right lateral frontal scalp contusion, not seen previously. Sinuses/Orbits: Clear sinuses and mastoids. Reverse S shaped nasal septum with right-sided spurring. Old lens extractions with otherwise negative orbits. Other: None. IMPRESSION: 1. No acute intracranial CT findings or interval changes. 2. Atrophy with small-vessel disease. 3. Small  right lateral frontal scalp contusion, was not seen on 05/23/2023. There is no depressed skull fracture. Electronically Signed   By: Almira Bar M.D.   On: 05/25/2023 00:57   DG Chest Port 1 View  Result Date: 05/25/2023 CLINICAL DATA:  Altered mental status with multiple fall EXAM: PORTABLE CHEST 1 VIEW COMPARISON:  09/25/2022 FINDINGS: The heart size and mediastinal contours are within normal limits. Both lungs are clear. The visualized skeletal structures are unremarkable. IMPRESSION: No active disease. Electronically Signed   By: Jasmine Pang M.D.   On: 05/25/2023 00:15   DG Pelvis Portable  Result Date: 05/25/2023 CLINICAL DATA:  Altered mental status multiple fall EXAM: PORTABLE PELVIS 1-2 VIEWS COMPARISON:  CT 06/18/2022 FINDINGS: SI joints are non widened. Pubic symphysis and rami appear intact. Bilateral hip degenerative change. No fracture or malalignment IMPRESSION: No acute osseous abnormality. Electronically Signed   By: Jasmine Pang M.D.   On: 05/25/2023 00:14   EEG adult  Result Date: 05/24/2023 Charlsie Quest, MD      05/24/2023  3:19 PM Patient Name: MAIZY FORNASH MRN: [id number] Epilepsy Attending: Charlsie Quest Referring Physician/Provider: Emeline General, MD Date: 05/24/2023 Duration: 30.23 mins Patient history: 82 year old female with altered mental status.  EEG to alert for seizure. Level of alertness: Awake, asleep AEDs during EEG study: None Technical aspects: This EEG study was done with scalp electrodes positioned according to the 10-20 International system of electrode placement. Electrical activity was reviewed with band pass filter of 1-70Hz , sensitivity of 7 uV/mm, display speed of 1mm/sec with a 60Hz  notched filter applied as appropriate. EEG data were recorded continuously and digitally stored.  Video monitoring was available and reviewed as appropriate. Description: The posterior dominant rhythm consists of 8-9 Hz activity of moderate voltage (25-35 uV) seen predominantly in posterior head regions, symmetric and reactive to eye opening and eye closing. Sleep was characterized by vertex waves, sleep spindles (12 to 14 Hz), maximal frontocentral region. EEG showed intermittent generalized 3 to 6 Hz theta-delta slowing. Hyperventilation and photic stimulation were not performed.   ABNORMALITY - Intermittent slow, generalized IMPRESSION: This study is suggestive of mild diffuse encephalopathy. No seizures or epileptiform discharges were seen throughout the recording. Charlsie Quest   MR BRAIN WO CONTRAST  Result Date: 05/23/2023 CLINICAL DATA:  Neuro deficit, acute, stroke suspected EXAM: MRI HEAD WITHOUT CONTRAST TECHNIQUE: Multiplanar, multiecho pulse sequences of the brain and surrounding structures were obtained without intravenous contrast. COMPARISON:  Same-day CT head, brain MR 05/03/2023 FINDINGS: Brain: Negative for an acute infarct. No hemorrhage. No hydrocephalus. No extra-axial fluid collection. Mass effect. No mass lesion. Chronic pontine and left cerebellar hemorrhages. There is sequela of mild  overall chronic microvascular ischemic change. Vascular: Normal flow voids. Skull and upper cervical spine: Degenerative changes in the upper cervical spine. Sinuses/Orbits: No middle ear or mastoid effusion. Paranasal sinuses are clear. Bilateral lens replacement. Orbits are otherwise unremarkable. Other: None. IMPRESSION: No acute intracranial process. Electronically Signed   By: Lorenza Cambridge M.D.   On: 05/23/2023 15:22   CT HEAD WO CONTRAST ( )  Result Date: 05/23/2023 CLINICAL DATA:  Neuro deficit, acute, stroke suspected EXAM: CT HEAD WITHOUT CONTRAST TECHNIQUE: Contiguous axial images were obtained from the base of the skull through the vertex without intravenous contrast. RADIATION DOSE REDUCTION: This exam was performed according to the departmental dose-optimization program which includes automated exposure control, adjustment of the mA and/or kV according to patient size and/or use of iterative reconstruction technique. COMPARISON:  CT Head 05/10/23 FINDINGS: Brain: No hemorrhage. No hydrocephalus. No extra-axial fluid collection. No CT evidence of an acute infarct. Sequela of mild chronic microvascular ischemic change. Generalized volume loss. Vascular: No hyperdense vessel or unexpected calcification. Skull: Normal. Negative for fracture or focal lesion. Sinuses/Orbits: No middle ear or mastoid effusion. Paranasal sinuses are clear. There are osseous changes suggestive of chronic left maxillary sinusitis. Bilateral lens replacement. Orbits are otherwise unremarkable. Other: None. IMPRESSION: No hemorrhage or CT evidence of an acute cortical infarct. Electronically Signed   By: Lorenza Cambridge M.D.   On: 05/23/2023 12:14   CT HEAD WO CONTRAST ( )  Result Date: 05/10/2023 CLINICAL DATA:  fall EXAM: CT HEAD WITHOUT CONTRAST TECHNIQUE: Contiguous axial images were obtained from the base of the skull through the vertex without intravenous contrast. RADIATION DOSE REDUCTION: This exam was performed  according to the departmental dose-optimization program which includes automated exposure control, adjustment of the mA and/or kV according to patient size and/or use of iterative reconstruction technique. COMPARISON:  Brain MR 05/03/2023 FINDINGS: Brain: No evidence of acute infarction, hemorrhage, hydrocephalus, extra-axial collection or mass lesion/mass effect. Mineralization of the basal ganglia bilaterally. Vascular: No hyperdense vessel or unexpected calcification. Skull: Normal. Negative for fracture or focal lesion. Sinuses/Orbits: No middle ear or mastoid effusion. Paranasal sinuses are clear. Bilateral lens replacement. Orbits are otherwise unremarkable. Other: None. IMPRESSION: No acute intracranial abnormality. Electronically Signed   By: Lorenza Cambridge M.D.   On: 05/10/2023 17:22   MR Brain Wo Contrast  Result Date: 05/03/2023 CLINICAL DATA:  Neuro deficit, acute, stroke suspected EXAM: MRI HEAD WITHOUT CONTRAST TECHNIQUE: Multiplanar, multiecho pulse sequences of the brain and surrounding structures were obtained without intravenous contrast. COMPARISON:  MRI head 09/17/2019. FINDINGS: Brain: No acute infarction, acute hemorrhage, hydrocephalus, extra-axial collection or mass lesion. Small foci susceptibility artifact in the left cerebellum and brainstem, compatible with prior microhemorrhages. Cerebral atrophy. Vascular: Major arterial flow voids are maintained at the skull base. Skull and upper cervical spine: Normal marrow signal. Sinuses/Orbits: Clear sinuses.  No acute orbital findings. Other: No mastoid effusions. IMPRESSION: No evidence of acute intracranial abnormality. Electronically Signed   By: Feliberto Harts M.D.   On: 05/03/2023 12:29    Microbiology: Results for orders placed or performed during the hospital encounter of 05/24/23  Culture, blood (routine x 2)     Status: None   Collection Time: 05/24/23 11:27 PM   Specimen: BLOOD  Result Value Ref Range Status   Specimen  Description BLOOD BLOOD LEFT ARM  Final   Special Requests   Final    BOTTLES DRAWN AEROBIC AND ANAEROBIC Blood Culture adequate volume   Culture   Final    NO GROWTH 5 DAYS Performed at [facility], [address] [place], [place] [address]    Report Status 05/29/2023 FINAL  Final  Resp panel by RT-PCR (RSV, Flu A&B, Covid) Anterior Nasal Swab     Status: None   Collection Time: 05/24/23 11:27 PM   Specimen: Anterior Nasal Swab  Result Value Ref Range Status   SARS Coronavirus 2 by RT PCR NEGATIVE NEGATIVE Final    Comment: (NOTE) SARS-CoV-2 target nucleic acids are NOT DETECTED.  The SARS-CoV-2 RNA is generally detectable in upper respiratory specimens during the acute phase of infection. The lowest concentration of SARS-CoV-2 viral copies this assay can detect is 138 copies/mL. A negative result does not preclude SARS-Cov-2 infection and should not be used as the sole basis for treatment or other patient  management decisions. A negative result may occur with  improper specimen collection/handling, submission of specimen other than nasopharyngeal swab, presence of viral mutation(s) within the areas targeted by this assay, and inadequate number of viral copies(<138 copies/mL). A negative result must be combined with clinical observations, patient history, and epidemiological information. The expected result is Negative.  Fact Sheet for Patients:  BloggerCourse.com  Fact Sheet for Healthcare Providers:  SeriousBroker.it  This test is no t yet approved or cleared by the [place] FDA and  has been authorized for detection and/or diagnosis of SARS-CoV-2 by FDA under an Emergency Use Authorization (EUA). This EUA will remain  in effect (meaning this test can be used) for the duration of the COVID-19 declaration under Section 564(b)(1) of the Act, 21 U.S.C.section 360bbb-3(b)(1), unless the authorization is  terminated  or revoked sooner.       Influenza A by PCR NEGATIVE NEGATIVE Final   Influenza B by PCR NEGATIVE NEGATIVE Final    Comment: (NOTE) The Xpert Xpress SARS-CoV-2/FLU/RSV plus assay is intended as an aid in the diagnosis of influenza from Nasopharyngeal swab specimens and should not be used as a sole basis for treatment. Nasal washings and aspirates are unacceptable for Xpert Xpress SARS-CoV-2/FLU/RSV testing.  Fact Sheet for Patients: BloggerCourse.com  Fact Sheet for Healthcare Providers: SeriousBroker.it  This test is not yet approved or cleared by the [place] FDA and has been authorized for detection and/or diagnosis of SARS-CoV-2 by FDA under an Emergency Use Authorization (EUA). This EUA will remain in effect (meaning this test can be used) for the duration of the COVID-19 declaration under Section 564(b)(1) of the Act, 21 U.S.C. section 360bbb-3(b)(1), unless the authorization is terminated or revoked.     Resp Syncytial Virus by PCR NEGATIVE NEGATIVE Final    Comment: (NOTE) Fact Sheet for Patients: BloggerCourse.com  Fact Sheet for Healthcare Providers: SeriousBroker.it  This test is not yet approved or cleared by the [place] FDA and has been authorized for detection and/or diagnosis of SARS-CoV-2 by FDA under an Emergency Use Authorization (EUA). This EUA will remain in effect (meaning this test can be used) for the duration of the COVID-19 declaration under Section 564(b)(1) of the Act, 21 U.S.C. section 360bbb-3(b)(1), unless the authorization is terminated or revoked.  Performed at [facility], [address] [place], [place] [address]   Culture, blood (routine x 2)     Status: None   Collection Time: 05/24/23 11:55 PM   Specimen: Right Antecubital; Blood  Result Value Ref Range Status   Specimen Description RIGHT  ANTECUBITAL  Final   Special Requests   Final    BOTTLES DRAWN AEROBIC AND ANAEROBIC Blood Culture results may not be optimal due to an excessive volume of blood received in culture bottles   Culture   Final    NO GROWTH 5 DAYS Performed at [facility], [address], [place], [place] [address]    Report Status 05/30/2023 FINAL  Final    Labs: CBC: No results for input(s): "WBC", "NEUTROABS", "HGB", "HCT", "MCV", "PLT" in the last 168 hours. Basic Metabolic Panel: No results for input(s): "NA", "K", "CL", "CO2", "GLUCOSE", "BUN", "CREATININE", "CALCIUM", "MG", "PHOS" in the last 168 hours. Liver Function Tests: No results for input(s): "AST", "ALT", "ALKPHOS", "BILITOT", "PROT", "ALBUMIN" in the last 168 hours. CBG: Recent Labs  Lab 05/26/23 1217  GLUCAP 98    Discharge time spent: greater than 30 minutes.  Signed: Alford Highland, MD [facility] 06/02/2023

## 2023-06-02 NOTE — Progress Notes (Signed)
Progress Note   Patient: Laurie Harris IHK:[id number] DOB: Aug 19, 1941 DOA: 05/24/2023     8 DOS: the patient was seen and examined on 06/02/2023   Brief hospital course: Taken from H&P.   Laurie Harris is a 82 y.o. female with medical history significant of ICH cerebella 2016, CKD stage IIIa, IDDM, HTN, HLD, thrombocytopenia, carotid artery stenosis, who presents with altered mental status and fall.   Patient was hospitalized from 9/19 - 9/20 due to altered mental status.  Her mental status has been declining recently.  Patient had slurred speech and unsteady gait.  She had negative MRI of brain, and EEG was with diffuse slowing and no seizure.  TSH is slightly elevated 6.15, but free T4 was normal.  Vitamin B12 level was normal.   Psychiatry evaluation, (+) grief reaction. Pt  was discharged to ALF.   Per report, pt was noted to be more confused and has had multiple falls.  Data reviewed and ED Course: pt was found to have WBC 6.9, GFR> 60, trop  60,  lactic acid 2.5, negative PCR for COVID, flu and RSV, hypothermia with temperature 94.6, blood pressure 152/83, heart rate of 110, RR 23, oxygen sat 99% on room air.  Chest x-ray negative.  CT head negative for acute intracranial abnormalities.  X-ray of pelvis negative.   EKG with sinus rhythm, QTc 499, LAD, anteroseptal infarction pattern, poor R wave progression.  Overnight due to rising troponin, she was started on heparin infusion.  Patient also met sepsis criteria due to hypothermia and tachycardia, no obvious source of infection.  She was given Lawyer and started on broad-spectrum antibiotics.  9/21: Improving hypothermia with temperature at 96 now, saturating well on room air.  Procalcitonin 0.13, lactic acidosis resolved.  Due to worsening troponin cardiology was also consulted, likely demand ischemia with hypothermia. Patient has an outpatient appointment with neurology on 06/17/2023 for her rapidly declining mental status  over the past few weeks.  Patient with a steep decline since Jul 05, 2024after the death of her son.  Echocardiogram was also ordered, cardiology is recommending conservative management for now. UDS was positive for ecstasy on 05/23/2023??  Palliative care was also consulted.  9/22: Vital stable but clinically seems worsening, started having difficulty with swallowing and appears more lethargic.  Swallow evaluation ordered.  Seems like patient is approaching end-of-life.  Palliative care to discuss with family regarding transitioning to comfort care. 9/23: Patient was transition to full comfort care by family.  She does not qualify for inpatient hospice services at this time.  Family to decide about proceeding with hospice at her facility or take her back home with hospice. 9/24: Currently medically stable with waxing and waning mental status.  Still unable to eat regular food.  Unfortunately her prior assisted living facility cannot take her back with hospice.  She does not qualify going to an hospice facility, [facility] and daughter is trying to explore other options. 9/25.  Patient needed help eating. 9/26.  Patient less talkative today.  Has her mouth open.  Daughter states last night that she was seeing people that already passed away. 15-Jun-2023.  No hospice home bed availability currently. 9/28  Patient was not accepted to the hospice home.  Continue end-of-life care here. 9/29.  Patient moved her arms to sternal rub.  Lost IV access.  Assessment and Plan: * End of life care Continue comfort care measures here in hospital.  Continue end-of-life care.  Since lost IV access, can  do Roxanol orally/sublingually.  Glycopyrrolate can be given subcutaneous injection, Haldol and lorazepam, given sublingually also.  Acute metabolic encephalopathy Patient with waxing and waning mental status over the past few weeks, rapid cognitive decline since the death of her son.  All imaging negative including MRI brain.  EEG  was done during most recent admission and it was negative for any seizure. Patient on dysphagia diet. Patient moved arms to sternal rub today  NSTEMI (non-ST elevated myocardial infarction) (HCC) Troponin peaked at 3148.  Made comfort care.  SIRS (systemic inflammatory response syndrome) (HCC) Patient meets criteria for SIRS with heart rate of 110, RR 23, hypothermic with temperature 94.6, no leukocytosis.  Lactic acid 2.5.  Made comfort care.  Did not have a sepsis source so sepsis ruled out.  Type II diabetes mellitus with renal manifestations (HCC) Medications discontinued since comfort care.  Benign essential HTN Medications discontinued  Thrombocytopenia (HCC) Chronic in nature  Hyperlipidemia Medications discontinued  Stage 3a chronic kidney disease (CKD) (HCC) Last creatinine 0.82.  Fall Patient with recurrent falls.  Multiple bruises.        Subjective: Patient with sternal rub today and moved her arms.  Did not talk.  Patient required medications for comfort care last evening.  Physical Exam: Vitals:   05/30/23 0736 05/31/23 0747 06/01/23 1854 06/02/23 0813  BP: (!) 154/66 (!) 130/51 (!) 141/70 (!) 169/64  Pulse: 88 73 90 80  Resp: 15 16 16 16   Temp: (!) 97 F (36.1 C) 97.6 F (36.4 C) (!) 97.5 F (36.4 C) (!) 97.5 F (36.4 C)  TempSrc:      SpO2: 100% 100% 98% 99%  Weight:      Height:       Physical Exam HENT:     Mouth/Throat:     Pharynx: No oropharyngeal exudate.  Eyes:     General: Lids are normal.  Cardiovascular:     Rate and Rhythm: Normal rate and regular rhythm.     Heart sounds: Normal heart sounds, S1 normal and S2 normal.  Pulmonary:     Breath sounds: Examination of the right-lower field reveals decreased breath sounds. Examination of the left-lower field reveals decreased breath sounds. Decreased breath sounds present. No wheezing, rhonchi or rales.  Abdominal:     Palpations: Abdomen is soft.     Tenderness: There is no  abdominal tenderness.  Musculoskeletal:     Right lower leg: No swelling.     Left lower leg: No swelling.  Skin:    General: Skin is warm.     Comments: Bruising on arms and legs and right side of face.  Neurological:     Mental Status: She is lethargic.     Comments: Moved her arms with sternal rub but did not talk.     Data Reviewed: No new data  Family Communication: Updated daughter with change in status from yesterday.  Disposition: Status is: Inpatient Remains inpatient appropriate because: End-of-life care here in the hospital  Planned Discharge Destination: No disposition plans at this time    Time spent: 27 minutes Case discussed with palliative care  Author: Alford Highland, MD 06/02/2023 11:11 AM  For on call review [email or web address].

## 2023-06-02 NOTE — Progress Notes (Signed)
EMS present for pt discharge; discharge packet given to the EMS personnel to take to the [facility]; pt discharged via stretcher by EMS

## 2023-06-02 NOTE — Progress Notes (Signed)
Daily Progress Note   Patient Name: Laurie Harris       Date: 06/02/2023 DOB: [birth year]  Age: 82 y.o. MRN#: [id number] Attending Physician: Laurie Highland, MD Primary Care Physician: Laurie Luis, MD Admit Date: 05/24/2023  Reason for Consultation/Follow-up: Establishing goals of care  HPI/Brief Hospital Review: 82 y.o. female  with past medical history of ICH cerebellar in 2016, CKD stage 3a, IDDM, HTN, HLD, thrombocytopenia, carotid artery stenosis admitted from [facility] on 05/24/2023 with AMS and multiple falls.   Noted recent hospitalization 9/19-9/20 due to AMS-completed stroke work up including MRI of brain and EEG both negative-discharged and returned to ALF    Palliative medicine was consulted for assisting with goals of care conversations.   Transitioned to full comfort measures 9/22.  Subjective: Extensive chart review has been completed prior to meeting patient including labs, vital signs, imaging, progress notes, orders, and available advanced directive documents from current and previous encounters.    Review of MAR, Laurie Harris received PRN doses of Lorazepam, Haldol and Morphine overnight to maintain comfort. On assessment this AM, Laurie Harris resting in bed with eyes open, does not acknowledge my presence in room or attempt to communicate. She does not appear to be in any pain or discomfort at this time.  Re-evaluated by HL and has been approved for IPU bed. Discharge and transfer anticipated for this afternoon.  Care plan was discussed with primary team, nursing staff, TOC and HL.  Thank you for allowing the Palliative Medicine Team to assist in the care of this patient.  Total time:  25 minutes  Time spent includes: Detailed review of medical records (labs,  imaging, vital signs), medically appropriate exam (mental status, respiratory, cardiac, skin), discussed with treatment team, counseling and educating patient, family and staff, documenting clinical information, medication management and coordination of care.  Laurie Deed, DNP, AGNP-C Palliative Medicine   Please contact Palliative Medicine Team phone at [phone] for questions and concerns.

## 2023-06-02 NOTE — TOC Transition Note (Addendum)
Transition of Care [facility]) - CM/SW Discharge Note   Patient Details  Name: Laurie Harris MRN: [id number] Date of Birth: 1940/12/09  Transition of Care [facility]) CM/SW Contact:  Bing Quarry, RN Phone Number: 06/02/2023, 12:02 PM   Clinical Narrative:  9/29: Patient being admitted to [facility] after re-evaluation. Per hospice liaison, paperwork/consents to be signed this afternoon and liaison will arrange for [facility] transport at that time. EMS forms excluding DNR form printed to unit printer at [facility], Hospice liaison to edit as needed. [facility] RN to call report to Hospice Home [phone].   Gabriel Cirri MSN RN CM  Transitions of Care Department [facility] [phone] Weekends Only      Final next level of care: Hospice Medical Facility Barriers to Discharge: Barriers Resolved   Patient Goals and CMS Choice      Discharge Placement                  Patient to be transferred to facility by: [facility] to [facility] by Hospice Liaison arrangement.   Patient and family notified of of transfer: 06/02/23  Discharge Plan and Services Additional resources added to the After Visit Summary for                  DME Arranged: N/A (Going to [facility] facility.) DME Agency: NA         HH Agency: [facility]        Social Determinants of Health (SDOH) Interventions SDOH Screenings   Food Insecurity: No Food Insecurity (05/25/2023)  Housing: Low Risk  (05/25/2023)  Transportation Needs: No Transportation Needs (05/25/2023)  Utilities: Not At Risk (05/25/2023)  Depression (PHQ2-9): High Risk (05/10/2023)  Financial Resource Strain: Low Risk  (05/09/2023)  Physical Activity: Inactive (05/09/2023)  Social Connections: Moderately Integrated (05/09/2023)  Stress: Patient Declined (05/09/2023)  Tobacco Use: Low Risk  (05/25/2023)     Readmission Risk Interventions     No data to display

## 2023-06-04 DIAGNOSIS — E118 Type 2 diabetes mellitus with unspecified complications: Secondary | ICD-10-CM | POA: Diagnosis not present

## 2023-06-04 DIAGNOSIS — I10 Essential (primary) hypertension: Secondary | ICD-10-CM | POA: Diagnosis not present

## 2023-06-04 DIAGNOSIS — I619 Nontraumatic intracerebral hemorrhage, unspecified: Secondary | ICD-10-CM | POA: Diagnosis not present

## 2023-06-04 DIAGNOSIS — I6522 Occlusion and stenosis of left carotid artery: Secondary | ICD-10-CM | POA: Diagnosis not present

## 2023-06-04 DIAGNOSIS — I6521 Occlusion and stenosis of right carotid artery: Secondary | ICD-10-CM | POA: Diagnosis not present

## 2023-06-04 DIAGNOSIS — F01A18 Vascular dementia, mild, with other behavioral disturbance: Secondary | ICD-10-CM | POA: Diagnosis not present

## 2023-06-04 DIAGNOSIS — N1831 Chronic kidney disease, stage 3a: Secondary | ICD-10-CM | POA: Diagnosis not present

## 2023-06-04 DIAGNOSIS — I6789 Other cerebrovascular disease: Secondary | ICD-10-CM | POA: Diagnosis not present

## 2023-06-04 DIAGNOSIS — E785 Hyperlipidemia, unspecified: Secondary | ICD-10-CM | POA: Diagnosis not present

## 2023-06-04 DIAGNOSIS — D696 Thrombocytopenia, unspecified: Secondary | ICD-10-CM | POA: Diagnosis not present

## 2023-06-10 ENCOUNTER — Telehealth: Payer: Medicare Other | Admitting: Oncology

## 2023-07-05 DEATH — deceased

## 2023-07-18 NOTE — Telephone Encounter (Signed)
error 

## 2023-07-29 ENCOUNTER — Telehealth: Payer: Self-pay | Admitting: Cardiovascular Disease

## 2023-07-29 NOTE — Telephone Encounter (Signed)
Patient passed away on 2023-06-23, daughter wanted Dr. Kirke Corin to know - FYI

## 2024-07-21 NOTE — Telephone Encounter (Signed)
 open in error
